# Patient Record
Sex: Female | Born: 1950
Health system: Southern US, Community
[De-identification: ages and names within clinical notes are randomized; demographics above are authoritative.]

## PROBLEM LIST (undated history)

## (undated) DIAGNOSIS — B348 Other viral infections of unspecified site: Secondary | ICD-10-CM

## (undated) DIAGNOSIS — J45909 Unspecified asthma, uncomplicated: Secondary | ICD-10-CM

## (undated) DIAGNOSIS — K8020 Calculus of gallbladder without cholecystitis without obstruction: Secondary | ICD-10-CM

## (undated) DIAGNOSIS — B338 Other specified viral diseases: Secondary | ICD-10-CM

## (undated) DIAGNOSIS — J189 Pneumonia, unspecified organism: Secondary | ICD-10-CM

## (undated) DIAGNOSIS — E785 Hyperlipidemia, unspecified: Secondary | ICD-10-CM

## (undated) DIAGNOSIS — A0472 Enterocolitis due to Clostridium difficile, not specified as recurrent: Secondary | ICD-10-CM

## (undated) DIAGNOSIS — C3490 Malignant neoplasm of unspecified part of unspecified bronchus or lung: Secondary | ICD-10-CM

## (undated) DIAGNOSIS — B029 Zoster without complications: Secondary | ICD-10-CM

## (undated) DIAGNOSIS — B974 Respiratory syncytial virus as the cause of diseases classified elsewhere: Secondary | ICD-10-CM

## (undated) DIAGNOSIS — H269 Unspecified cataract: Secondary | ICD-10-CM

## (undated) DIAGNOSIS — J449 Chronic obstructive pulmonary disease, unspecified: Secondary | ICD-10-CM

## (undated) DIAGNOSIS — D649 Anemia, unspecified: Secondary | ICD-10-CM

## (undated) DIAGNOSIS — J479 Bronchiectasis, uncomplicated: Secondary | ICD-10-CM

## (undated) DIAGNOSIS — I2510 Atherosclerotic heart disease of native coronary artery without angina pectoris: Secondary | ICD-10-CM

## (undated) DIAGNOSIS — J984 Other disorders of lung: Secondary | ICD-10-CM

## (undated) DIAGNOSIS — I509 Heart failure, unspecified: Secondary | ICD-10-CM

## (undated) DIAGNOSIS — H9190 Unspecified hearing loss, unspecified ear: Secondary | ICD-10-CM

## (undated) DIAGNOSIS — I7300 Raynaud's syndrome without gangrene: Secondary | ICD-10-CM

## (undated) DIAGNOSIS — D128 Benign neoplasm of rectum: Secondary | ICD-10-CM

## (undated) DIAGNOSIS — I10 Essential (primary) hypertension: Secondary | ICD-10-CM

## (undated) DIAGNOSIS — M341 CR(E)ST syndrome: Secondary | ICD-10-CM

## (undated) DIAGNOSIS — M8580 Other specified disorders of bone density and structure, unspecified site: Secondary | ICD-10-CM

## (undated) HISTORY — DX: Benign neoplasm of rectum: D12.8

## (undated) HISTORY — DX: Chronic obstructive pulmonary disease, unspecified: J44.9

## (undated) HISTORY — DX: Unspecified hearing loss, unspecified ear: H91.90

## (undated) HISTORY — DX: Bronchiectasis, uncomplicated: J47.9

## (undated) HISTORY — DX: Raynaud's syndrome without gangrene: I73.00

## (undated) HISTORY — DX: Atherosclerotic heart disease of native coronary artery without angina pectoris: I25.10

## (undated) HISTORY — DX: Zoster without complications: B02.9

## (undated) HISTORY — DX: Other specified viral diseases: B33.8

## (undated) HISTORY — DX: Unspecified asthma, uncomplicated: J45.909

## (undated) HISTORY — DX: Anemia, unspecified: D64.9

## (undated) HISTORY — DX: Other viral infections of unspecified site: B34.8

## (undated) HISTORY — DX: Unspecified cataract: H26.9

## (undated) HISTORY — DX: Other disorders of lung: J98.4

## (undated) HISTORY — DX: Malignant neoplasm of unspecified part of unspecified bronchus or lung: C34.90

## (undated) HISTORY — DX: Other specified disorders of bone density and structure, unspecified site: M85.80

## (undated) HISTORY — DX: Cr(e)st syndrome: M34.1

## (undated) HISTORY — PX: COLONOSCOPY: SHX174

## (undated) HISTORY — DX: Pneumonia, unspecified organism: J18.9

## (undated) HISTORY — DX: Calculus of gallbladder without cholecystitis without obstruction: K80.20

## (undated) HISTORY — DX: Hyperlipidemia, unspecified: E78.5

## (undated) HISTORY — DX: Respiratory syncytial virus as the cause of diseases classified elsewhere: B97.4

## (undated) HISTORY — DX: Enterocolitis due to Clostridium difficile, not specified as recurrent: A04.72

---

## 1998-10-03 ENCOUNTER — Emergency Department (HOSPITAL_COMMUNITY): Admission: EM | Admit: 1998-10-03 | Discharge: 1998-10-03 | Payer: Self-pay | Admitting: Emergency Medicine

## 1998-10-03 ENCOUNTER — Encounter: Payer: Self-pay | Admitting: Emergency Medicine

## 2001-06-13 ENCOUNTER — Other Ambulatory Visit: Admission: RE | Admit: 2001-06-13 | Discharge: 2001-06-13 | Payer: Self-pay | Admitting: Obstetrics and Gynecology

## 2002-02-22 HISTORY — PX: OTHER SURGICAL HISTORY: SHX169

## 2002-04-23 ENCOUNTER — Encounter: Payer: Self-pay | Admitting: Family Medicine

## 2002-04-23 ENCOUNTER — Ambulatory Visit (HOSPITAL_COMMUNITY): Admission: RE | Admit: 2002-04-23 | Discharge: 2002-04-23 | Payer: Self-pay | Admitting: Family Medicine

## 2002-05-22 ENCOUNTER — Ambulatory Visit (HOSPITAL_COMMUNITY): Admission: RE | Admit: 2002-05-22 | Discharge: 2002-05-22 | Payer: Self-pay | Admitting: Family Medicine

## 2002-05-22 ENCOUNTER — Encounter: Payer: Self-pay | Admitting: Family Medicine

## 2002-06-12 ENCOUNTER — Ambulatory Visit (HOSPITAL_COMMUNITY): Admission: RE | Admit: 2002-06-12 | Discharge: 2002-06-12 | Payer: Self-pay | Admitting: Family Medicine

## 2002-06-12 ENCOUNTER — Encounter: Payer: Self-pay | Admitting: Family Medicine

## 2002-06-14 ENCOUNTER — Ambulatory Visit (HOSPITAL_COMMUNITY): Admission: RE | Admit: 2002-06-14 | Discharge: 2002-06-14 | Payer: Self-pay | Admitting: Family Medicine

## 2002-06-14 ENCOUNTER — Encounter: Payer: Self-pay | Admitting: Family Medicine

## 2002-07-11 ENCOUNTER — Other Ambulatory Visit: Admission: RE | Admit: 2002-07-11 | Discharge: 2002-07-11 | Payer: Self-pay | Admitting: Obstetrics and Gynecology

## 2002-07-19 ENCOUNTER — Inpatient Hospital Stay (HOSPITAL_COMMUNITY): Admission: RE | Admit: 2002-07-19 | Discharge: 2002-07-23 | Payer: Self-pay | Admitting: Thoracic Surgery

## 2002-07-19 ENCOUNTER — Encounter (INDEPENDENT_AMBULATORY_CARE_PROVIDER_SITE_OTHER): Payer: Self-pay | Admitting: *Deleted

## 2002-07-19 ENCOUNTER — Encounter: Payer: Self-pay | Admitting: Thoracic Surgery

## 2002-07-20 ENCOUNTER — Encounter: Payer: Self-pay | Admitting: Thoracic Surgery

## 2002-07-21 ENCOUNTER — Encounter: Payer: Self-pay | Admitting: Thoracic Surgery

## 2002-07-22 ENCOUNTER — Encounter: Payer: Self-pay | Admitting: Thoracic Surgery

## 2002-07-23 ENCOUNTER — Encounter: Payer: Self-pay | Admitting: Thoracic Surgery

## 2002-07-31 ENCOUNTER — Encounter: Admission: RE | Admit: 2002-07-31 | Discharge: 2002-07-31 | Payer: Self-pay | Admitting: Thoracic Surgery

## 2002-07-31 ENCOUNTER — Encounter: Payer: Self-pay | Admitting: Thoracic Surgery

## 2002-08-23 ENCOUNTER — Encounter: Admission: RE | Admit: 2002-08-23 | Discharge: 2002-08-23 | Payer: Self-pay | Admitting: Thoracic Surgery

## 2002-08-23 ENCOUNTER — Encounter: Payer: Self-pay | Admitting: Thoracic Surgery

## 2002-08-31 ENCOUNTER — Encounter: Admission: RE | Admit: 2002-08-31 | Discharge: 2002-08-31 | Payer: Self-pay | Admitting: Thoracic Surgery

## 2002-08-31 ENCOUNTER — Encounter: Payer: Self-pay | Admitting: Thoracic Surgery

## 2002-09-04 ENCOUNTER — Encounter: Payer: Self-pay | Admitting: Thoracic Surgery

## 2002-09-06 ENCOUNTER — Ambulatory Visit (HOSPITAL_COMMUNITY): Admission: RE | Admit: 2002-09-06 | Discharge: 2002-09-06 | Payer: Self-pay | Admitting: Thoracic Surgery

## 2002-09-11 ENCOUNTER — Encounter: Admission: RE | Admit: 2002-09-11 | Discharge: 2002-09-11 | Payer: Self-pay | Admitting: Thoracic Surgery

## 2002-09-11 ENCOUNTER — Encounter: Payer: Self-pay | Admitting: Thoracic Surgery

## 2002-09-14 ENCOUNTER — Encounter: Admission: RE | Admit: 2002-09-14 | Discharge: 2002-09-14 | Payer: Self-pay | Admitting: Thoracic Surgery

## 2002-09-14 ENCOUNTER — Encounter: Payer: Self-pay | Admitting: Thoracic Surgery

## 2002-09-18 ENCOUNTER — Encounter: Admission: RE | Admit: 2002-09-18 | Discharge: 2002-09-18 | Payer: Self-pay | Admitting: Thoracic Surgery

## 2002-09-18 ENCOUNTER — Encounter: Payer: Self-pay | Admitting: Thoracic Surgery

## 2002-10-02 ENCOUNTER — Encounter: Payer: Self-pay | Admitting: Thoracic Surgery

## 2002-10-02 ENCOUNTER — Encounter: Admission: RE | Admit: 2002-10-02 | Discharge: 2002-10-02 | Payer: Self-pay | Admitting: Thoracic Surgery

## 2002-10-11 ENCOUNTER — Inpatient Hospital Stay (HOSPITAL_COMMUNITY): Admission: RE | Admit: 2002-10-11 | Discharge: 2002-12-07 | Payer: Self-pay | Admitting: Pulmonary Disease

## 2002-10-11 ENCOUNTER — Encounter (INDEPENDENT_AMBULATORY_CARE_PROVIDER_SITE_OTHER): Payer: Self-pay | Admitting: Specialist

## 2002-10-11 ENCOUNTER — Encounter: Payer: Self-pay | Admitting: Thoracic Surgery

## 2002-10-12 ENCOUNTER — Encounter: Payer: Self-pay | Admitting: Thoracic Surgery

## 2002-10-13 ENCOUNTER — Encounter: Payer: Self-pay | Admitting: Thoracic Surgery

## 2002-10-14 ENCOUNTER — Encounter: Payer: Self-pay | Admitting: Thoracic Surgery

## 2002-10-15 ENCOUNTER — Encounter: Payer: Self-pay | Admitting: Thoracic Surgery

## 2002-10-16 ENCOUNTER — Encounter: Payer: Self-pay | Admitting: Thoracic Surgery

## 2002-10-17 ENCOUNTER — Encounter: Payer: Self-pay | Admitting: Thoracic Surgery

## 2002-10-18 ENCOUNTER — Encounter: Payer: Self-pay | Admitting: Thoracic Surgery

## 2002-10-19 ENCOUNTER — Encounter: Payer: Self-pay | Admitting: Thoracic Surgery

## 2002-10-21 ENCOUNTER — Encounter: Payer: Self-pay | Admitting: Thoracic Surgery

## 2002-10-22 ENCOUNTER — Encounter (INDEPENDENT_AMBULATORY_CARE_PROVIDER_SITE_OTHER): Payer: Self-pay | Admitting: *Deleted

## 2002-10-22 ENCOUNTER — Encounter: Payer: Self-pay | Admitting: Thoracic Surgery

## 2002-10-23 ENCOUNTER — Encounter: Payer: Self-pay | Admitting: Thoracic Surgery

## 2002-10-24 ENCOUNTER — Encounter: Payer: Self-pay | Admitting: Thoracic Surgery

## 2002-10-25 ENCOUNTER — Encounter: Payer: Self-pay | Admitting: Thoracic Surgery

## 2002-10-25 ENCOUNTER — Encounter: Payer: Self-pay | Admitting: Internal Medicine

## 2002-10-25 ENCOUNTER — Encounter: Payer: Self-pay | Admitting: Vascular Surgery

## 2002-10-26 ENCOUNTER — Encounter: Payer: Self-pay | Admitting: Internal Medicine

## 2002-10-26 ENCOUNTER — Encounter: Payer: Self-pay | Admitting: Thoracic Surgery

## 2002-10-27 ENCOUNTER — Encounter: Payer: Self-pay | Admitting: Thoracic Surgery

## 2002-10-28 ENCOUNTER — Encounter: Payer: Self-pay | Admitting: Thoracic Surgery

## 2002-10-29 ENCOUNTER — Encounter (INDEPENDENT_AMBULATORY_CARE_PROVIDER_SITE_OTHER): Payer: Self-pay | Admitting: *Deleted

## 2002-10-29 ENCOUNTER — Encounter: Payer: Self-pay | Admitting: General Surgery

## 2002-10-29 ENCOUNTER — Encounter: Payer: Self-pay | Admitting: Thoracic Surgery

## 2002-10-30 ENCOUNTER — Encounter: Payer: Self-pay | Admitting: Pulmonary Disease

## 2002-10-31 ENCOUNTER — Encounter: Payer: Self-pay | Admitting: Thoracic Surgery

## 2002-11-01 ENCOUNTER — Encounter: Payer: Self-pay | Admitting: Thoracic Surgery

## 2002-11-02 ENCOUNTER — Encounter: Payer: Self-pay | Admitting: Thoracic Surgery

## 2002-11-03 ENCOUNTER — Encounter: Payer: Self-pay | Admitting: Thoracic Surgery

## 2002-11-04 ENCOUNTER — Encounter: Payer: Self-pay | Admitting: Thoracic Surgery

## 2002-11-05 ENCOUNTER — Encounter: Payer: Self-pay | Admitting: Thoracic Surgery

## 2002-11-06 ENCOUNTER — Encounter: Payer: Self-pay | Admitting: Thoracic Surgery

## 2002-11-07 ENCOUNTER — Encounter: Payer: Self-pay | Admitting: Pulmonary Disease

## 2002-11-07 ENCOUNTER — Encounter: Payer: Self-pay | Admitting: Thoracic Surgery

## 2002-11-08 ENCOUNTER — Encounter: Payer: Self-pay | Admitting: Thoracic Surgery

## 2002-11-09 ENCOUNTER — Encounter: Payer: Self-pay | Admitting: Thoracic Surgery

## 2002-11-10 ENCOUNTER — Encounter: Payer: Self-pay | Admitting: Thoracic Surgery

## 2002-11-11 ENCOUNTER — Encounter: Payer: Self-pay | Admitting: Thoracic Surgery

## 2002-11-12 ENCOUNTER — Encounter: Payer: Self-pay | Admitting: Thoracic Surgery

## 2002-11-13 ENCOUNTER — Encounter: Payer: Self-pay | Admitting: Thoracic Surgery

## 2002-11-13 ENCOUNTER — Encounter: Payer: Self-pay | Admitting: Gastroenterology

## 2002-11-14 ENCOUNTER — Encounter: Payer: Self-pay | Admitting: Thoracic Surgery

## 2002-11-14 ENCOUNTER — Encounter: Payer: Self-pay | Admitting: Gastroenterology

## 2002-11-14 ENCOUNTER — Encounter (INDEPENDENT_AMBULATORY_CARE_PROVIDER_SITE_OTHER): Payer: Self-pay | Admitting: *Deleted

## 2002-11-16 ENCOUNTER — Encounter: Payer: Self-pay | Admitting: Thoracic Surgery

## 2002-11-18 ENCOUNTER — Encounter: Payer: Self-pay | Admitting: Thoracic Surgery

## 2002-11-19 ENCOUNTER — Encounter: Payer: Self-pay | Admitting: Pulmonary Disease

## 2002-11-20 ENCOUNTER — Encounter: Payer: Self-pay | Admitting: Thoracic Surgery

## 2002-11-21 ENCOUNTER — Encounter: Payer: Self-pay | Admitting: Thoracic Surgery

## 2002-11-21 ENCOUNTER — Encounter (INDEPENDENT_AMBULATORY_CARE_PROVIDER_SITE_OTHER): Payer: Self-pay | Admitting: Specialist

## 2002-11-23 ENCOUNTER — Encounter: Payer: Self-pay | Admitting: Thoracic Surgery

## 2002-11-26 ENCOUNTER — Encounter: Payer: Self-pay | Admitting: Pulmonary Disease

## 2002-11-30 ENCOUNTER — Encounter: Payer: Self-pay | Admitting: Pulmonary Disease

## 2002-12-03 ENCOUNTER — Encounter: Payer: Self-pay | Admitting: Pulmonary Disease

## 2002-12-04 ENCOUNTER — Encounter: Payer: Self-pay | Admitting: Pulmonary Disease

## 2002-12-06 ENCOUNTER — Encounter: Payer: Self-pay | Admitting: Thoracic Surgery

## 2002-12-13 ENCOUNTER — Encounter: Admission: RE | Admit: 2002-12-13 | Discharge: 2002-12-13 | Payer: Self-pay | Admitting: Thoracic Surgery

## 2002-12-13 ENCOUNTER — Encounter: Payer: Self-pay | Admitting: Thoracic Surgery

## 2003-01-08 ENCOUNTER — Encounter: Admission: RE | Admit: 2003-01-08 | Discharge: 2003-01-08 | Payer: Self-pay | Admitting: Thoracic Surgery

## 2003-01-24 ENCOUNTER — Encounter: Admission: RE | Admit: 2003-01-24 | Discharge: 2003-01-24 | Payer: Self-pay | Admitting: Thoracic Surgery

## 2003-02-20 ENCOUNTER — Encounter: Admission: RE | Admit: 2003-02-20 | Discharge: 2003-02-20 | Payer: Self-pay | Admitting: Thoracic Surgery

## 2003-03-07 ENCOUNTER — Encounter: Admission: RE | Admit: 2003-03-07 | Discharge: 2003-03-07 | Payer: Self-pay | Admitting: Thoracic Surgery

## 2003-04-11 ENCOUNTER — Encounter: Admission: RE | Admit: 2003-04-11 | Discharge: 2003-04-11 | Payer: Self-pay | Admitting: Thoracic Surgery

## 2003-04-29 ENCOUNTER — Encounter: Payer: Self-pay | Admitting: Internal Medicine

## 2003-05-09 ENCOUNTER — Encounter: Admission: RE | Admit: 2003-05-09 | Discharge: 2003-05-09 | Payer: Self-pay | Admitting: Thoracic Surgery

## 2003-05-10 ENCOUNTER — Ambulatory Visit (HOSPITAL_COMMUNITY): Admission: RE | Admit: 2003-05-10 | Discharge: 2003-05-10 | Payer: Self-pay | Admitting: Internal Medicine

## 2003-05-30 ENCOUNTER — Encounter: Admission: RE | Admit: 2003-05-30 | Discharge: 2003-05-30 | Payer: Self-pay | Admitting: Thoracic Surgery

## 2003-11-26 ENCOUNTER — Encounter: Payer: Self-pay | Admitting: Internal Medicine

## 2003-12-24 ENCOUNTER — Ambulatory Visit: Payer: Self-pay | Admitting: Internal Medicine

## 2004-02-23 ENCOUNTER — Encounter: Payer: Self-pay | Admitting: Internal Medicine

## 2004-02-23 LAB — CONVERTED CEMR LAB: Pap Smear: NORMAL

## 2004-02-26 ENCOUNTER — Ambulatory Visit: Payer: Self-pay | Admitting: Internal Medicine

## 2004-04-13 ENCOUNTER — Other Ambulatory Visit: Admission: RE | Admit: 2004-04-13 | Discharge: 2004-04-13 | Payer: Self-pay | Admitting: Obstetrics and Gynecology

## 2004-05-27 ENCOUNTER — Ambulatory Visit: Payer: Self-pay | Admitting: Internal Medicine

## 2004-09-24 ENCOUNTER — Ambulatory Visit: Payer: Self-pay | Admitting: Internal Medicine

## 2004-10-06 ENCOUNTER — Ambulatory Visit: Payer: Self-pay | Admitting: Internal Medicine

## 2004-12-21 ENCOUNTER — Ambulatory Visit: Payer: Self-pay | Admitting: Internal Medicine

## 2005-01-12 ENCOUNTER — Ambulatory Visit: Payer: Self-pay | Admitting: Internal Medicine

## 2005-02-22 DIAGNOSIS — D128 Benign neoplasm of rectum: Secondary | ICD-10-CM

## 2005-02-22 HISTORY — DX: Benign neoplasm of rectum: D12.8

## 2005-03-01 ENCOUNTER — Ambulatory Visit: Payer: Self-pay | Admitting: Internal Medicine

## 2005-03-23 ENCOUNTER — Ambulatory Visit: Payer: Self-pay | Admitting: Internal Medicine

## 2005-04-02 ENCOUNTER — Ambulatory Visit: Payer: Self-pay | Admitting: Internal Medicine

## 2005-06-07 ENCOUNTER — Ambulatory Visit: Payer: Self-pay | Admitting: Internal Medicine

## 2005-07-21 ENCOUNTER — Ambulatory Visit: Payer: Self-pay | Admitting: Internal Medicine

## 2005-11-23 ENCOUNTER — Ambulatory Visit: Payer: Self-pay | Admitting: Internal Medicine

## 2005-11-29 ENCOUNTER — Ambulatory Visit: Payer: Self-pay | Admitting: Internal Medicine

## 2005-12-21 ENCOUNTER — Ambulatory Visit: Payer: Self-pay | Admitting: Internal Medicine

## 2006-01-07 ENCOUNTER — Ambulatory Visit: Payer: Self-pay | Admitting: Internal Medicine

## 2006-01-07 ENCOUNTER — Encounter: Payer: Self-pay | Admitting: Internal Medicine

## 2006-01-07 ENCOUNTER — Encounter (INDEPENDENT_AMBULATORY_CARE_PROVIDER_SITE_OTHER): Payer: Self-pay | Admitting: Specialist

## 2006-03-02 ENCOUNTER — Encounter: Payer: Self-pay | Admitting: Internal Medicine

## 2006-05-31 ENCOUNTER — Ambulatory Visit: Payer: Self-pay | Admitting: Internal Medicine

## 2006-11-08 ENCOUNTER — Encounter: Payer: Self-pay | Admitting: Internal Medicine

## 2006-11-08 DIAGNOSIS — M899 Disorder of bone, unspecified: Secondary | ICD-10-CM | POA: Insufficient documentation

## 2006-11-08 DIAGNOSIS — I7300 Raynaud's syndrome without gangrene: Secondary | ICD-10-CM | POA: Insufficient documentation

## 2006-11-08 DIAGNOSIS — E785 Hyperlipidemia, unspecified: Secondary | ICD-10-CM | POA: Insufficient documentation

## 2006-11-08 DIAGNOSIS — M949 Disorder of cartilage, unspecified: Secondary | ICD-10-CM

## 2006-12-07 ENCOUNTER — Ambulatory Visit: Payer: Self-pay | Admitting: Internal Medicine

## 2006-12-07 LAB — CONVERTED CEMR LAB
ALT: 22 units/L (ref 0–35)
AST: 23 units/L (ref 0–37)
Albumin: 4.1 g/dL (ref 3.5–5.2)
Alkaline Phosphatase: 68 units/L (ref 39–117)
BUN: 13 mg/dL (ref 6–23)
Basophils Absolute: 0 10*3/uL (ref 0.0–0.1)
Basophils Relative: 0.3 % (ref 0.0–1.0)
Bilirubin Urine: NEGATIVE
Bilirubin, Direct: 0.2 mg/dL (ref 0.0–0.3)
CO2: 28 meq/L (ref 19–32)
Calcium: 9.7 mg/dL (ref 8.4–10.5)
Chloride: 101 meq/L (ref 96–112)
Cholesterol: 248 mg/dL (ref 0–200)
Creatinine, Ser: 0.7 mg/dL (ref 0.4–1.2)
Direct LDL: 167 mg/dL
Eosinophils Absolute: 0.1 10*3/uL (ref 0.0–0.6)
Eosinophils Relative: 0.9 % (ref 0.0–5.0)
GFR calc Af Amer: 112 mL/min
GFR calc non Af Amer: 93 mL/min
Glucose, Bld: 103 mg/dL — ABNORMAL HIGH (ref 70–99)
Glucose, Urine, Semiquant: NEGATIVE
HCT: 39.6 % (ref 36.0–46.0)
HDL: 41.9 mg/dL (ref >39.0)
Hemoglobin: 13.4 g/dL (ref 12.0–15.0)
Ketones, urine, test strip: NEGATIVE
Lymphocytes Relative: 26.6 % (ref 12.0–46.0)
MCHC: 33.7 g/dL (ref 30.0–36.0)
MCV: 87.1 fL (ref 78.0–100.0)
Monocytes Absolute: 0.4 10*3/uL (ref 0.2–0.7)
Monocytes Relative: 6.4 % (ref 3.0–11.0)
Neutro Abs: 3.7 10*3/uL (ref 1.4–7.7)
Neutrophils Relative %: 65.8 % (ref 43.0–77.0)
Nitrite: NEGATIVE
Platelets: 220 10*3/uL (ref 150–400)
Potassium: 4.7 meq/L (ref 3.5–5.1)
Protein, U semiquant: NEGATIVE
RBC: 4.55 M/uL (ref 3.87–5.11)
RDW: 14.7 % — ABNORMAL HIGH (ref 11.5–14.6)
Sodium: 138 meq/L (ref 135–145)
Specific Gravity, Urine: 1.01
TSH: 3.55 microintl units/mL (ref 0.35–5.50)
Total Bilirubin: 0.8 mg/dL (ref 0.3–1.2)
Total CHOL/HDL Ratio: 5.9
Total Protein: 7 g/dL (ref 6.0–8.3)
Triglycerides: 207 mg/dL (ref 0–149)
Urobilinogen, UA: 0.2
VLDL: 41 mg/dL — ABNORMAL HIGH (ref 0–40)
WBC Urine, dipstick: NEGATIVE
WBC: 5.7 10*3/uL (ref 4.5–10.5)
pH: 5.5

## 2006-12-14 ENCOUNTER — Ambulatory Visit: Payer: Self-pay | Admitting: Internal Medicine

## 2006-12-26 ENCOUNTER — Ambulatory Visit: Payer: Self-pay | Admitting: Internal Medicine

## 2007-01-04 ENCOUNTER — Ambulatory Visit: Payer: Self-pay | Admitting: Pulmonary Disease

## 2007-01-10 ENCOUNTER — Ambulatory Visit: Payer: Self-pay | Admitting: Pulmonary Disease

## 2007-01-13 ENCOUNTER — Telehealth (INDEPENDENT_AMBULATORY_CARE_PROVIDER_SITE_OTHER): Payer: Self-pay | Admitting: *Deleted

## 2007-01-16 ENCOUNTER — Ambulatory Visit: Payer: Self-pay | Admitting: Internal Medicine

## 2007-01-26 ENCOUNTER — Ambulatory Visit: Payer: Self-pay | Admitting: Pulmonary Disease

## 2007-02-06 ENCOUNTER — Telehealth (INDEPENDENT_AMBULATORY_CARE_PROVIDER_SITE_OTHER): Payer: Self-pay | Admitting: *Deleted

## 2007-02-09 ENCOUNTER — Encounter (HOSPITAL_COMMUNITY): Admission: RE | Admit: 2007-02-09 | Discharge: 2007-02-21 | Payer: Self-pay | Admitting: Pulmonary Disease

## 2007-02-23 ENCOUNTER — Encounter (HOSPITAL_COMMUNITY): Admission: RE | Admit: 2007-02-23 | Discharge: 2007-05-18 | Payer: Self-pay | Admitting: Internal Medicine

## 2007-05-31 ENCOUNTER — Encounter: Payer: Self-pay | Admitting: Pulmonary Disease

## 2007-07-27 ENCOUNTER — Ambulatory Visit: Payer: Self-pay | Admitting: Critical Care Medicine

## 2007-09-27 ENCOUNTER — Ambulatory Visit: Payer: Self-pay | Admitting: Pulmonary Disease

## 2008-01-24 ENCOUNTER — Ambulatory Visit: Payer: Self-pay | Admitting: Internal Medicine

## 2008-01-24 LAB — CONVERTED CEMR LAB
AST: 22 units/L (ref 0–37)
Basophils Absolute: 0 10*3/uL (ref 0.0–0.1)
Bilirubin Urine: NEGATIVE
Bilirubin, Direct: 0.1 mg/dL (ref 0.0–0.3)
Blood in Urine, dipstick: NEGATIVE
CO2: 31 meq/L (ref 19–32)
Chloride: 104 meq/L (ref 96–112)
Cholesterol: 251 mg/dL (ref 0–200)
Creatinine, Ser: 0.6 mg/dL (ref 0.4–1.2)
Direct LDL: 173.7 mg/dL
Eosinophils Absolute: 0 10*3/uL (ref 0.0–0.7)
Eosinophils Relative: 0.9 % (ref 0.0–5.0)
Glucose, Bld: 89 mg/dL (ref 70–99)
Glucose, Urine, Semiquant: NEGATIVE
HCT: 37.7 % (ref 36.0–46.0)
Ketones, urine, test strip: NEGATIVE
MCHC: 34.2 g/dL (ref 30.0–36.0)
Neutro Abs: 4 10*3/uL (ref 1.4–7.7)
Neutrophils Relative %: 71 % (ref 43.0–77.0)
Nitrite: NEGATIVE
Potassium: 4.3 meq/L (ref 3.5–5.1)
Protein, U semiquant: NEGATIVE
Sodium: 142 meq/L (ref 135–145)
Specific Gravity, Urine: 1.015
Total Bilirubin: 0.6 mg/dL (ref 0.3–1.2)
Urobilinogen, UA: 0.2
pH: 6.5

## 2008-02-07 ENCOUNTER — Ambulatory Visit: Payer: Self-pay | Admitting: Internal Medicine

## 2008-02-07 DIAGNOSIS — M549 Dorsalgia, unspecified: Secondary | ICD-10-CM | POA: Insufficient documentation

## 2008-03-12 ENCOUNTER — Encounter: Payer: Self-pay | Admitting: Internal Medicine

## 2008-03-27 ENCOUNTER — Ambulatory Visit: Payer: Self-pay | Admitting: Pulmonary Disease

## 2008-04-03 ENCOUNTER — Encounter: Payer: Self-pay | Admitting: Internal Medicine

## 2008-04-10 ENCOUNTER — Ambulatory Visit: Payer: Self-pay | Admitting: Internal Medicine

## 2008-06-18 ENCOUNTER — Ambulatory Visit: Payer: Self-pay | Admitting: Family Medicine

## 2008-06-18 DIAGNOSIS — I10 Essential (primary) hypertension: Secondary | ICD-10-CM | POA: Insufficient documentation

## 2008-07-10 ENCOUNTER — Ambulatory Visit: Payer: Self-pay | Admitting: Internal Medicine

## 2008-10-30 ENCOUNTER — Ambulatory Visit: Payer: Self-pay | Admitting: Internal Medicine

## 2008-12-09 ENCOUNTER — Encounter (INDEPENDENT_AMBULATORY_CARE_PROVIDER_SITE_OTHER): Payer: Self-pay | Admitting: *Deleted

## 2009-02-03 ENCOUNTER — Ambulatory Visit: Payer: Self-pay | Admitting: Internal Medicine

## 2009-02-03 LAB — CONVERTED CEMR LAB
ALT: 15 units/L (ref 0–35)
Alkaline Phosphatase: 79 units/L (ref 39–117)
Basophils Relative: 0.7 % (ref 0.0–3.0)
Bilirubin, Direct: 0 mg/dL (ref 0.0–0.3)
Calcium: 9.5 mg/dL (ref 8.4–10.5)
Chloride: 105 meq/L (ref 96–112)
Cholesterol: 238 mg/dL — ABNORMAL HIGH (ref 0–200)
Creatinine, Ser: 0.7 mg/dL (ref 0.4–1.2)
Eosinophils Relative: 1.3 % (ref 0.0–5.0)
Lymphocytes Relative: 23.9 % (ref 12.0–46.0)
Neutrophils Relative %: 67.1 % (ref 43.0–77.0)
RBC: 4.08 M/uL (ref 3.87–5.11)
Total CHOL/HDL Ratio: 5
Total Protein: 7.6 g/dL (ref 6.0–8.3)
Triglycerides: 140 mg/dL (ref 0.0–149.0)
VLDL: 28 mg/dL (ref 0.0–40.0)
WBC: 5.2 10*3/uL (ref 4.5–10.5)

## 2009-02-10 ENCOUNTER — Ambulatory Visit: Payer: Self-pay | Admitting: Internal Medicine

## 2009-03-26 ENCOUNTER — Encounter: Payer: Self-pay | Admitting: Internal Medicine

## 2009-04-02 ENCOUNTER — Ambulatory Visit: Payer: Self-pay | Admitting: Pulmonary Disease

## 2009-04-18 ENCOUNTER — Encounter (INDEPENDENT_AMBULATORY_CARE_PROVIDER_SITE_OTHER): Payer: Self-pay | Admitting: *Deleted

## 2009-05-20 ENCOUNTER — Encounter (INDEPENDENT_AMBULATORY_CARE_PROVIDER_SITE_OTHER): Payer: Self-pay | Admitting: *Deleted

## 2009-05-21 ENCOUNTER — Ambulatory Visit: Payer: Self-pay | Admitting: Internal Medicine

## 2009-06-06 ENCOUNTER — Ambulatory Visit: Payer: Self-pay | Admitting: Internal Medicine

## 2009-06-11 ENCOUNTER — Encounter: Payer: Self-pay | Admitting: Internal Medicine

## 2009-07-07 ENCOUNTER — Telehealth (INDEPENDENT_AMBULATORY_CARE_PROVIDER_SITE_OTHER): Payer: Self-pay | Admitting: *Deleted

## 2009-07-23 ENCOUNTER — Ambulatory Visit: Payer: Self-pay | Admitting: Internal Medicine

## 2009-08-29 ENCOUNTER — Ambulatory Visit: Payer: Self-pay | Admitting: Pulmonary Disease

## 2009-11-06 ENCOUNTER — Encounter: Payer: Self-pay | Admitting: Internal Medicine

## 2009-12-03 ENCOUNTER — Ambulatory Visit: Payer: Self-pay | Admitting: Pulmonary Disease

## 2009-12-03 ENCOUNTER — Encounter: Payer: Self-pay | Admitting: Internal Medicine

## 2009-12-05 ENCOUNTER — Encounter: Payer: Self-pay | Admitting: Pulmonary Disease

## 2009-12-05 DIAGNOSIS — J45909 Unspecified asthma, uncomplicated: Secondary | ICD-10-CM | POA: Insufficient documentation

## 2010-02-25 ENCOUNTER — Other Ambulatory Visit: Payer: Self-pay | Admitting: Internal Medicine

## 2010-02-25 ENCOUNTER — Ambulatory Visit
Admission: RE | Admit: 2010-02-25 | Discharge: 2010-02-25 | Payer: Self-pay | Source: Home / Self Care | Attending: Internal Medicine | Admitting: Internal Medicine

## 2010-02-25 LAB — BASIC METABOLIC PANEL
BUN: 19 mg/dL (ref 6–23)
CO2: 30 mEq/L (ref 19–32)
Calcium: 9.7 mg/dL (ref 8.4–10.5)
Chloride: 101 mEq/L (ref 96–112)
Creatinine, Ser: 0.6 mg/dL (ref 0.4–1.2)
GFR: 111.53 mL/min (ref >60.00)
Glucose, Bld: 92 mg/dL (ref 70–99)
Potassium: 5.1 mEq/L (ref 3.5–5.1)
Sodium: 139 mEq/L (ref 135–145)

## 2010-02-25 LAB — CONVERTED CEMR LAB
Bilirubin Urine: NEGATIVE
Blood in Urine, dipstick: NEGATIVE
Glucose, Urine, Semiquant: NEGATIVE
Ketones, urine, test strip: NEGATIVE
Nitrite: NEGATIVE
Protein, U semiquant: NEGATIVE
Specific Gravity, Urine: 1.015
Urobilinogen, UA: 0.2
WBC Urine, dipstick: NEGATIVE
pH: 5

## 2010-02-25 LAB — CBC WITH DIFFERENTIAL/PLATELET
Basophils Absolute: 0 10*3/uL (ref 0.0–0.1)
Basophils Relative: 0.4 % (ref 0.0–3.0)
Eosinophils Absolute: 0.1 10*3/uL (ref 0.0–0.7)
Eosinophils Relative: 1.4 % (ref 0.0–5.0)
HCT: 37.4 % (ref 36.0–46.0)
Hemoglobin: 12.7 g/dL (ref 12.0–15.0)
Lymphocytes Relative: 20.6 % (ref 12.0–46.0)
Lymphs Abs: 1.1 10*3/uL (ref 0.7–4.0)
MCHC: 33.8 g/dL (ref 30.0–36.0)
MCV: 87.6 fl (ref 78.0–100.0)
Monocytes Absolute: 0.4 10*3/uL (ref 0.1–1.0)
Monocytes Relative: 6.5 % (ref 3.0–12.0)
Neutro Abs: 3.9 10*3/uL (ref 1.4–7.7)
Neutrophils Relative %: 71.1 % (ref 43.0–77.0)
Platelets: 291 10*3/uL (ref 150.0–400.0)
RBC: 4.27 Mil/uL (ref 3.87–5.11)
RDW: 15.9 % — ABNORMAL HIGH (ref 11.5–14.6)
WBC: 5.5 10*3/uL (ref 4.5–10.5)

## 2010-02-25 LAB — LDL CHOLESTEROL, DIRECT: Direct LDL: 172 mg/dL

## 2010-02-25 LAB — HEPATIC FUNCTION PANEL
ALT: 22 U/L (ref 0–35)
AST: 22 U/L (ref 0–37)
Albumin: 4.2 g/dL (ref 3.5–5.2)
Alkaline Phosphatase: 82 U/L (ref 39–117)
Bilirubin, Direct: 0.1 mg/dL (ref 0.0–0.3)
Total Bilirubin: 0.6 mg/dL (ref 0.3–1.2)
Total Protein: 7.6 g/dL (ref 6.0–8.3)

## 2010-02-25 LAB — LIPID PANEL
Cholesterol: 247 mg/dL — ABNORMAL HIGH (ref 0–200)
HDL: 53.5 mg/dL (ref >39.00)
Total CHOL/HDL Ratio: 5
Triglycerides: 137 mg/dL (ref 0.0–149.0)
VLDL: 27.4 mg/dL (ref 0.0–40.0)

## 2010-02-25 LAB — TSH: TSH: 3.31 u[IU]/mL (ref 0.35–5.50)

## 2010-03-04 ENCOUNTER — Ambulatory Visit
Admission: RE | Admit: 2010-03-04 | Discharge: 2010-03-04 | Payer: Self-pay | Source: Home / Self Care | Attending: Internal Medicine | Admitting: Internal Medicine

## 2010-03-14 ENCOUNTER — Encounter: Payer: Self-pay | Admitting: Thoracic Surgery

## 2010-03-15 ENCOUNTER — Encounter: Payer: Self-pay | Admitting: Thoracic Surgery

## 2010-03-15 ENCOUNTER — Encounter: Payer: Self-pay | Admitting: Internal Medicine

## 2010-03-19 ENCOUNTER — Encounter
Admission: RE | Admit: 2010-03-19 | Discharge: 2010-03-24 | Payer: Self-pay | Source: Home / Self Care | Attending: Internal Medicine | Admitting: Internal Medicine

## 2010-03-24 NOTE — Assessment & Plan Note (Signed)
Summary: acute sick visit for pna   Primary Provider/Referring Provider:  [name] MD  CC:  Acute Visit.  c/o prod cough with yellow mucus, fatigue, HA, increased SOB, and wheezing x11days.  fever 100.6 yesterday.  History of Present Illness: The pt comes in today for an acute sick visit.  She has known cold air hyperreactivity, as well as a prior complex chest wall resection for cancer.  She gives a 1-2 week h/o chest congestion, cough with purulent mucus, fever, and now sob.  This started as upper respiratory symptoms after flying home from [place].  She has significant sob on lying down at night.  She has not had any purulence from nares, no abdominal or urinary tract symptoms.  Prior to this, she had been doing well from a pulmonary standpoint.  Current Medications (verified): 1)  Metronidazole 0.75 %  Lotn (Metronidazole) .... Apply At Bedtime As Needed 2)  Triamcinolone Acetonide 0.5 % Crea (Triamcinolone Acetonide) .... Apply 1 A Small Amount To Affected Area Twice A Day As Needed 3)  Multivitamins   Tabs (Multiple Vitamin) .... Once Daily 4)  Oxycodone-Acetaminophen 5-325 Mg Tabs (Oxycodone-Acetaminophen) .... Take 1 Tablet By Mouth Three Times A Day 5)  Gabapentin 400 Mg Caps (Gabapentin) .... Take 1 Tablet By Mouth Four Times A Day 6)  Cymbalta 60 Mg  Cpep (Duloxetine Hcl) .... Take 1 Capsule By Mouth At Bedtime 7)  Risaquad  Caps (Probiotic Product) .... Once Daily 8)  Lidoderm 5 % Ptch (Lidocaine) .... To Chest Wall--12 Hours On and 12 Hours Off 9)  Proair Hfa 108 (90 Base) Mcg/act  Aers (Albuterol Sulfate) .... 2 Puffs Every 4-6 Hours As Needed 10)  Calcium Citrate-Vitamin D 200-125 Mg-Unit Tabs (Calcium Citrate-Vitamin D) .... Once Daily 11)  Lisinopril 2.5 Mg Tabs (Lisinopril) .... Take 1 Tablet By Mouth Once A Day 12)  Glucosamine 1500 Complex  Caps (Glucosamine-Chondroit-Vit C-Mn) .... Once Daily  Allergies (verified): 1)  ! Megace Oral (Megestrol Acetate) 2)  !  Lodine  Past History:  Past medical, surgical, family and social histories (including risk factors) reviewed, and no changes noted (except as noted below).  Past Medical History: Reviewed history from 03/27/2008 and no changes required. 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 multiple redos of surgeries---  thoracotomies    redo at [facility],  bronchopleural fistula  , pulmonary carcinoid Cholecystectomy  Family History: Reviewed history from 12/14/2006 and no changes required. adopted no known family history  Social History: Reviewed history from 12/14/2006 and no changes required. Married Never Smoked Regular exercise-no  Review of Systems       The patient complains of shortness of breath with activity, shortness of breath at rest, productive cough, headaches, nasal congestion/difficulty breathing through nose, change in color of mucus, and fever.  The patient denies non-productive cough, coughing up blood, chest pain, irregular heartbeats, acid heartburn, indigestion, loss of appetite, weight change, abdominal pain, difficulty swallowing, sore throat, tooth/dental problems, sneezing, itching, ear ache, anxiety, depression, hand/feet swelling, joint stiffness or pain, and rash.    Vital Signs:  Patient profile:   60 year old female Menstrual status:  postmenopausal Height:      65 inches Weight:      139 pounds BMI:  23.21 O2 Sat:      94 % on Room air Temp:     100.0 degrees F oral Pulse rate:   112 / minute BP sitting:   130 / 80  (left arm) Cuff size:   regular  Vitals Entered By: Raymondo Band RN (August 29, 2009 3:30 PM)  O2 Flow:  Room  air CC: Acute Visit.  c/o prod cough with yellow mucus, fatigue, HA, increased SOB, wheezing x11days.  fever 100.6 yesterday Comments Medications reviewed with patient Daytime contact number verified with patient. Raymondo Band RN  August 29, 2009 3:32 PM    Physical Exam  General:  wd female in nad Eyes:  PERRLA and EOMI.   Nose:  narrowed on right, no purulence bilat Mouth:  clear, no exudates or inflammation Neck:  no LN Lungs:  decreased bs left base, faint right basilar crackles no wheezing noted. Heart:  rrr, no mrg Extremities:  no edema or cyanosis Neurologic:  alert and oriented, moves all 4.   Impression & Recommendations:  Problem # 1:  PNEUMONIA, ORGANISM UNSPECIFIED (ICD-486) the pt has had persistent cough with congestion and purulence mucus, as well as ongoing fever.  This is most c/w pna on clinical grounds, but could just be acute bronchitis.  Either way, will need treatment with abx.  Will obtain a cxr to verify diagnosis, but also make sure she doesn't have a complication such as effusion.  Will treat with avelox for 7 days, and she is to call if worsening symptoms or not improving.  Medications Added to Medication List This Visit: 1)  Avelox 400 Mg Tabs (Moxifloxacin hcl) .... By mouth daily  Other Orders: Est. Patient Level IV (99214) T-2 View CXR (71020TC)  Patient Instructions: 1)  will start on avelox 452m one each day for the next 7 days. 2)  will check a cxr today, and call you with results 3)  drink plenty of fluids, can take mucinex for thick mucus and congestion 4)  keep followup with me in October, but call if you are not getting better within a week.  Prescriptions: AVELOX 400 MG  TABS (MOXIFLOXACIN HCL) By mouth daily  #5 x 0   Entered and Authorized by:   KKathee DeltonMD   Signed by:   KKathee DeltonMD on 08/29/2009   Method used:   Print then Give to Patient   RxID:   [id number]

## 2010-03-24 NOTE — Assessment & Plan Note (Signed)
Summary: rov for RAD, restrictive lung disease.   Visit Type:  Follow-up Primary Deborah [name]/Referring Deborah [name]:  Deborah Sharps MD  CC:  3 month follow up. Pt states her breathing is doing okay. Pt states when it is cold she gets more sob.  Pt states no cough. Pt states she would like flu shot today. Deborah [name]  History of Present Illness: the pt comes in today for followup of her cold air hyperreactivity.  She also has known restrictive lung disease related to a complex chest wall resection of a cancer in the past.  Since the weather has changed, she has noted a little bit more of a breathing issue with caring for her horses in the am's.  She denies any breathing issues if she is not in cold air.  She denies any cough or congestion.    Current Medications (verified): 1)  Metronidazole 0.75 %  Lotn (Metronidazole) .... Apply At Bedtime As Needed 2)  Triamcinolone Acetonide 0.5 % Crea (Triamcinolone Acetonide) .... Apply 1 A Small Amount To Affected Area Twice A Day As Needed 3)  Multivitamins   Tabs (Multiple Vitamin) .... Once Daily 4)  Oxycodone-Acetaminophen 5-325 Mg Tabs (Oxycodone-Acetaminophen) .... Take 1 Tablet By Mouth Three Times A Day 5)  Gabapentin 400 Mg Caps (Gabapentin) .... Take 1 Tablet By Mouth Four Times A Day 6)  Cymbalta 60 Mg  Cpep (Duloxetine Hcl) .... Take 1 Capsule By Mouth At Bedtime 7)  Risaquad  Caps (Probiotic Product) .... Once Daily 8)  Lidoderm 5 % Ptch (Lidocaine) .... To Chest Wall--12 Hours On and 12 Hours Off 9)  Proair Hfa 108 (90 Base) Mcg/act  Aers (Albuterol Sulfate) .... 2 Puffs Every 4-6 Hours As Needed 10)  Calcium Citrate-Vitamin D 200-125 Mg-Unit Tabs (Calcium Citrate-Vitamin D) .... Once Daily 11)  Lisinopril 2.5 Mg Tabs (Lisinopril) .... Take 1 Tablet By Mouth Once A Day  Allergies (verified): 1)  ! Megace Oral (Megestrol Acetate) 2)  ! Lodine  Review of Systems       The patient complains of shortness of breath with activity.  The patient denies  shortness of breath at rest, productive cough, non-productive cough, coughing up blood, chest pain, irregular heartbeats, acid heartburn, indigestion, loss of appetite, weight change, abdominal pain, difficulty swallowing, sore throat, tooth/dental problems, headaches, nasal congestion/difficulty breathing through nose, sneezing, itching, ear ache, anxiety, depression, hand/feet swelling, joint stiffness or pain, rash, change in color of mucus, and fever.    Vital Signs:  Patient profile:   60 year old female Menstrual status:  postmenopausal Height:      65 inches Weight:      127.25 pounds BMI:     21.25 O2 Sat:      96 % on Room air Temp:     98.7 degrees F oral Pulse rate:   87 / minute BP sitting:   112 / 74  (left arm) Cuff size:   regular  Vitals Entered By: [name] (December 03, 2009 1:46 PM)  O2 Flow:  Room air CC: 3 month follow up. Pt states her breathing is doing okay. Pt states when it is cold she gets more sob.  Pt states no cough. Pt states she would like flu shot today.  Comments meds and allergies updated Phone number updated [name]  December 03, 2009 1:46 PM    Physical Exam  General:  wd female in nad Lungs:  a few pops and squeaks, good air movement, no wheezing Heart:  rrr, no mrg  Extremities:  no edema or cyanosis  Neurologic:  alert and oriented, moves all 4.   Impression & Recommendations:  Problem # 1:  REACTIVE AIRWAY DISEASE (ICD-493.90) the pt has a h/o dyspnea and cough with cold air exposure, but has no issues with breathing during the rest of the year.  I have always been concerned about occult asthma, but the pt has always done well with as needed albuterol.  I have also discussed with her trying foradil as a maintenance medication once a day during the cooler mos as well.  Her spirometry today does not show airflow obstruction, but I cannot 100% exclude occult asthma.  I have discussed this with the pt, and she would like to continue with as  needed meds rather than getting on combo therapy with ICS unless her symptoms change.  I do think we need to do spirometry yearly to make sure her flows are not worsening.  Other Orders: Admin 1st Vaccine [phone]) Flu Vaccine 44yr + ((91638 Est. Patient Level III ((46659 Spirometry w/Graph (94010)  Patient Instructions: 1)  will give you the flu shot today 2)  continue with proair as needed for cold air exposure, but can try foradil as needed for longer term exposures (12hrs). 3)  followup in one year.  Prescriptions: PROAIR HFA 108 (90 BASE) MCG/ACT  AERS (ALBUTEROL SULFATE) 2 puffs every 4-6 hours as needed  #1 x 11   Entered and Authorized by:   KKathee DeltonMD   Signed by:   KKathee DeltonMD on 12/03/2009   Method used:   Print then Give to Patient   RxID:   [id number]              Flu Vaccine Consent Questions     Do you have a history of severe allergic reactions to this vaccine? no    Any prior history of allergic reactions to egg and/or gelatin? no    Do you have a sensitivity to the preservative Thimersol? no    Do you have a past history of Guillan-Barre Syndrome? no    Do you currently have an acute febrile illness? no    Have you ever had a severe reaction to latex? no    Vaccine information given and explained to patient? yes    Are you currently pregnant? no    Lot Number:[id number]   Exp Date:08/22/2010   Site Given  Left Deltoid IMflu  MCharma Igo December 03, 2009 4:08 PM

## 2010-03-24 NOTE — Letter (Signed)
Summary: Previsit letter  [facility]  [address] [place], [place] [address]   Phone: [phone]  Fax: [phone]       04/18/2009 MRN: [id number]  [place], [place]  [address]  Dear Ms. Kenilworth,  Welcome to the Gastroenterology Division at [facility].    You are scheduled to see a nurse for your pre-procedure visit on 05-21-09 at 2:00p.m. on the 3rd floor at [facility] [place] [employer].  We ask that you try to arrive at our office 15 minutes prior to your appointment time to allow for check-in.  Your nurse visit will consist of discussing your medical and surgical history, your immediate family medical history, and your medications.    Please bring a complete list of all your medications or, if you prefer, bring the medication bottles and we will list them.  We will need to be aware of both prescribed and over the counter drugs.  We will need to know exact dosage information as well.  If you are on blood thinners (Coumadin, Plavix, Aggrenox, Ticlid, etc.) please call our office today/prior to your appointment, as we need to consult with your physician about holding your medication.   Please be prepared to read and sign documents such as consent forms, a financial agreement, and acknowledgement forms.  If necessary, and with your consent, a friend or relative is welcome to sit-in on the nurse visit with you.  Please bring your insurance card so that we may make a copy of it.  If your insurance requires a referral to see a specialist, please bring your referral form from your primary care physician.  No co-pay is required for this nurse visit.     If you cannot keep your appointment, please call [phone] to cancel or reschedule prior to your appointment date.  This allows [place] the opportunity to schedule an appointment for another patient in need of care.    Thank you for choosing [facility] for your medical  needs.  We appreciate the opportunity to care for you.  Please visit [place] at our website  to learn more about our practice.                     Sincerely.                                                                                                                   The Gastroenterology Division

## 2010-03-24 NOTE — Assessment & Plan Note (Signed)
Summary: 6 month rov/njr   Vital Signs:  Patient profile:   60 year old female Menstrual status:  postmenopausal Weight:      129 pounds Temp:     98.6 degrees F oral Pulse rate:   84 / minute Pulse rhythm:   regular Resp:     12 per minute BP sitting:   138 / 80  (left arm) Cuff size:   regular  Vitals Entered By: Rica Records, RN (July 23, 2009 11:36 AM) CC: 6 month rov--on trial of gabapentin in place of lyrica Is Patient Diabetic? No   Primary Care Provider:  [name] MD  CC:  6 month rov--on trial of gabapentin in place of lyrica.  History of Present Illness:  Follow-Up Visit      This is a 60 year old woman who presents for Follow-up visit.  The patient denies chest pain and palpitations.  Since the last visit the patient notes no new problems or concerns and being seen by a specialist.  The patient reports taking meds as prescribed and not monitoring BP.  When questioned about possible medication side effects, the patient notes none.  Chronic pain syndrome started neurontin---stopped lyrica.  All other systems reviewed and were negative   Preventive Screening-Counseling & Management  Alcohol-Tobacco     Smoking Status: never  Current Problems (verified): 1)  Hypertension  (ICD-401.9) 2)  Back Pain, Chronic  (ICD-724.5) 3)  Empyema W/fistula-resolved  (ICD-510.0) 4)  Physical Examination  (ICD-V70.0) 5)  Raynaud's Disease  (ICD-443.0) 6)  Osteopenia  (ICD-733.90) 7)  Hyperlipidemia  (ICD-272.4)  Current Medications (verified): 1)  Metronidazole 0.75 %  Lotn (Metronidazole) .... Apply At Bedtime As Needed 2)  Triamcinolone Acetonide 0.5 % Crea (Triamcinolone Acetonide) .... Apply 1 A Small Amount To Affected Area Twice A Day As Needed 3)  Multivitamins   Tabs (Multiple Vitamin) .... Once Daily 4)  Oxycodone-Acetaminophen 5-325 Mg Tabs (Oxycodone-Acetaminophen) .... Take 1 Tablet By Mouth Three Times A Day 5)  Gabapentin 400 Mg Caps (Gabapentin) .... Take 1  Tablet By Mouth Four Times A Day 6)  Cymbalta 60 Mg  Cpep (Duloxetine Hcl) .... Take 1 Capsule By Mouth At Bedtime 7)  Risaquad  Caps (Probiotic Product) .... Once Daily 8)  Lidoderm 5 % Ptch (Lidocaine) .... To Chest Wall--12 Hours On and 12 Hours Off 9)  Proair Hfa 108 (90 Base) Mcg/act  Aers (Albuterol Sulfate) .... 2 Puffs Every 4-6 Hours As Needed 10)  Calcium Citrate-Vitamin D 200-125 Mg-Unit Tabs (Calcium Citrate-Vitamin D) .... Once Daily 11)  Lisinopril 2.5 Mg Tabs (Lisinopril) .... Take 1 Tablet By Mouth Once A Day 12)  Glucosamine 1500 Complex  Caps (Glucosamine-Chondroit-Vit C-Mn) .... Once Daily 13)  Vitamin D .... (Unsure of Dosage) Take 1 Tablet By Mouth Once A Day 14)  Foradil Aerolizer 12 Mcg Caps (Formoterol Fumarate) .... One in Am and Pm Only As Needed  Allergies: 1)  ! Megace Oral (Megestrol Acetate) 2)  ! Lodine  Past History:  Past Medical History: Last updated: 03/27/2008 Hyperlipidemia Osteopenia C diff colitis, recurrent Raynauds pulmonary carcinoid--bronchopleural fistula  2004 h/o restrictive lung disease secondary to chest wall deformity assoc with above carcinoid surgery hyperreactive airways disease to cold and organic material in the care of horses  Past Surgical History: Last updated: 02/07/2008 multiple redos of surgeries---  thoracotomies    redo at [facility],  bronchopleural fistula  , pulmonary carcinoid Cholecystectomy  Family History: Last updated: 12/14/2006 adopted no known family history  Social History: Last updated: 12/14/2006 Married Never Smoked Regular exercise-no  Risk Factors: Exercise: no (12/14/2006)  Risk Factors: Smoking Status: never (07/23/2009)  Physical Exam  General:  alert and well-developed.   Head:  normocephalic and atraumatic.   Eyes:  pupils equal and pupils round.   Ears:  R ear normal and L ear normal.   Neck:  No deformities, masses, or tenderness noted. Chest Wall:  No deformities, masses, or  tenderness noted. surgical scar Lungs:  normal respiratory effort and no intercostal retractions.   rare external wheeze Heart:  normal rate and regular rhythm.   Abdomen:  soft and non-tender.   Msk:  No deformity or scoliosis noted of thoracic or lumbar spine.   Neurologic:  cranial nerves II-XII intact and gait normal.     Impression & Recommendations:  Problem # 1:  HYPERTENSION (ICD-401.9) reasonable control continue current medications  Her updated medication list for this problem includes:    Lisinopril 2.5 Mg Tabs (Lisinopril) .Marland Kitchen... Take 1 tablet by mouth once a day  BP today: 138/80 Prior BP: 122/76 (04/02/2009)  Labs Reviewed: K+: 4.8 (02/03/2009) Creat: : 0.7 (02/03/2009)   Chol: 238 (02/03/2009)   HDL: 47.70 (02/03/2009)   LDL: DEL (01/24/2008)   TG: 140.0 (02/03/2009)  Problem # 2:  BACK PAIN, CHRONIC (ICD-724.5) doing well on new medication regimen continue current medications  Her updated medication list for this problem includes:    Oxycodone-acetaminophen 5-325 Mg Tabs (Oxycodone-acetaminophen) .Marland Kitchen... Take 1 tablet by mouth three times a day  Problem # 3:  EMPYEMA W/FISTULA-RESOLVED (ICD-510.0) no recurrence  Problem # 4:  HYPERLIPIDEMIA (ICD-272.4) no treatment at this time Labs Reviewed: SGOT: 20 (02/03/2009)   SGPT: 15 (02/03/2009)   HDL:47.70 (02/03/2009), 49.3 (01/24/2008)  LDL:DEL (01/24/2008), DEL (12/07/2006)  Chol:238 (02/03/2009), 251 (01/24/2008)  Trig:140.0 (02/03/2009), 165 (01/24/2008)  Complete Medication List: 1)  Metronidazole 0.75 % Lotn (Metronidazole) .... Apply at bedtime as needed 2)  Triamcinolone Acetonide 0.5 % Crea (Triamcinolone acetonide) .... Apply 1 a small amount to affected area twice a day as needed 3)  Multivitamins Tabs (Multiple vitamin) .... Once daily 4)  Oxycodone-acetaminophen 5-325 Mg Tabs (Oxycodone-acetaminophen) .... Take 1 tablet by mouth three times a day 5)  Gabapentin 400 Mg Caps (Gabapentin) .... Take 1  tablet by mouth four times a day 6)  Cymbalta 60 Mg Cpep (Duloxetine hcl) .... Take 1 capsule by mouth at bedtime 7)  Risaquad Caps (Probiotic product) .... Once daily 8)  Lidoderm 5 % Ptch (Lidocaine) .... To chest wall--12 hours on and 12 hours off 9)  Proair Hfa 108 (90 Base) Mcg/act Aers (Albuterol sulfate) .... 2 puffs every 4-6 hours as needed 10)  Calcium Citrate-vitamin D 200-125 Mg-unit Tabs (Calcium citrate-vitamin d) .... Once daily 11)  Lisinopril 2.5 Mg Tabs (Lisinopril) .... Take 1 tablet by mouth once a day 12)  Glucosamine 1500 Complex Caps (Glucosamine-chondroit-vit c-mn) .... Once daily 13)  Vitamin D  .... (unsure of dosage) take 1 tablet by mouth once a day 14)  Foradil Aerolizer 12 Mcg Caps (Formoterol fumarate) .... One in am and pm only as needed  Patient Instructions: 1)  Please schedule a follow-up appointment in 6 months, CPX

## 2010-03-24 NOTE — Miscellaneous (Signed)
Summary: [facility]  Clinical Lists Changes  Medications: Added new medication of MOVIPREP 100 GM  SOLR (PEG-KCL-NACL-NASULF-NA ASC-C) As per prep instructions. - Signed Rx of MOVIPREP 100 GM  SOLR (PEG-KCL-NACL-NASULF-NA ASC-C) As per prep instructions.;  #1 x 0;  Signed;  Entered by: Ulice Dash RN;  Authorized by: Gatha Mayer MD, [facility];  Method used: Electronically to [facility] [phone]*, [address] [place], [place], [place]  [address], Ph: [phone] or [phone], Fax: [phone] Observations: Added new observation of ALLERGY REV: Done (05/21/2009 13:49)    Prescriptions: MOVIPREP 100 GM  SOLR (PEG-KCL-NACL-NASULF-NA ASC-C) As per prep instructions.  #1 x 0   Entered by:   Ulice Dash RN   Authorized by:   Gatha Mayer MD, [facility]   Signed by:   Ulice Dash RN on 05/21/2009   Method used:   Electronically to        [facility] [address]* (retail)       [address]       [place], [place]  [address]       Ph: [phone] or [phone]       Fax: [phone]   RxID:   [id number]

## 2010-03-24 NOTE — Letter (Signed)
Summary: Patient Notice- Polyp Results  [facility]  [place], [place] [address]   Phone: [phone]  Fax: [phone]        June 11, 2009 MRN: [id number]    [place], [place]  [address]    Dear Ms. Tarkio,  The polyp removed from your colon was adenomatous. This means that it was pre-cancerous or that  it had the potential to change into cancer over time.  I recommend that you have a repeat colonoscopy in 5 years to determine if you have developed any new polyps over time. If you develop any new rectal bleeding, abdominal pain or significant bowel habit changes, please contact us before then.  Please call us if you are having persistent problems or have questions about your condition that have not been fully answered at this time.   Sincerely,  Gatha Mayer MD, [facility]  This letter has been electronically signed by your physician.  Appended Document: Patient Notice- Polyp Results letter mailed 4.25.11

## 2010-03-24 NOTE — Letter (Signed)
Summary: [facility] Instructions  [facility]  [place], [place] [address]   Phone: [phone]  Fax: [phone]       [place]    12/03/1950    MRN: [id number]        Procedure Day /Date:  Friday 06/06/2009     Arrival Time: 9:30 am      Procedure Time: 10:30 am     Location of Procedure:                    _x _  [place] (4th Floor)                        [place]   Starting 5 days prior to your procedure Sunday 4/10 do not eat nuts, seeds, popcorn, corn, beans, peas,  salads, or any raw vegetables.  Do not take any fiber supplements (e.g. Metamucil, Citrucel, and Benefiber).  THE DAY BEFORE YOUR PROCEDURE         DATE: Thursday 4/14  1.  Drink clear liquids the entire day-NO SOLID FOOD  2.  Do not drink anything colored red or purple.  Avoid juices with pulp.  No orange juice.  3.  Drink at least 64 oz. (8 glasses) of fluid/clear liquids during the day to prevent dehydration and help the prep work efficiently.  CLEAR LIQUIDS INCLUDE: Water Jello Ice Popsicles Tea (sugar ok, no milk/cream) Powdered fruit flavored drinks Coffee (sugar ok, no milk/cream) Gatorade Juice: apple, white grape, white cranberry  Lemonade Clear bullion, consomm, broth Carbonated beverages (any kind) Strained chicken noodle soup Hard Candy                             4.  In the morning, mix first dose of MoviPrep solution:    Empty 1 Pouch A and 1 Pouch B into the disposable container    Add lukewarm drinking water to the top line of the container. Mix to dissolve    Refrigerate (mixed solution should be used within 24 hrs)  5.  Begin drinking the prep at 5:00 p.m. The MoviPrep container is divided by 4 marks.   Every 15 minutes drink the solution down to the next mark (approximately 8 oz) until the full liter is complete.   6.  Follow completed prep with 16 oz of clear liquid of your choice  (Nothing red or purple).  Continue to drink clear liquids until bedtime.  7.  Before going to bed, mix second dose of MoviPrep solution:    Empty 1 Pouch A and 1 Pouch B into the disposable container    Add lukewarm drinking water to the top line of the container. Mix to dissolve    Refrigerate  THE DAY OF YOUR PROCEDURE      DATE: Friday 4/15  Beginning at 5:30 a.m. (5 hours before procedure):         1. Every 15 minutes, drink the solution down to the next mark (approx 8 oz) until the full liter is complete.  2. Follow completed prep with 16 oz. of clear liquid of your choice.    3. You may drink clear liquids until 8:30 am (2 HOURS BEFORE PROCEDURE).   MEDICATION INSTRUCTIONS  Unless otherwise instructed, you should take regular prescription medications with a small sip of water   as early as possible the morning of  your procedure.           OTHER INSTRUCTIONS  You will need a responsible adult at least 58 years of age to accompany you and drive you home.   This person must remain in the waiting room during your procedure.  Wear loose fitting clothing that is easily removed.  Leave jewelry and other valuables at home.  However, you may wish to bring a book to read or  an iPod/MP3 player to listen to music as you wait for your procedure to start.  Remove all body piercing jewelry and leave at home.  Total time from sign-in until discharge is approximately 2-3 hours.  You should go home directly after your procedure and rest.  You can resume normal activities the  day after your procedure.  The day of your procedure you should not:   Drive   Make legal decisions   Operate machinery   Drink alcohol   Return to work  You will receive specific instructions about eating, activities and medications before you leave.    The above instructions have been reviewed and explained to me by   Ulice Dash RN  May 21, 2009 2:21 PM     I fully understand and  can verbalize these instructions _____________________________ Date _________

## 2010-03-24 NOTE — Progress Notes (Signed)
Summary: foradil  Phone Note Call from Patient Call back at Home Phone [phone]   Caller: Patient Call For: clance Reason for Call: Talk to Nurse, Talk to Doctor Summary of Call: Do you think [place]adil would be [place]?   She will also be more active while there.   She going out next month. Initial call taken by: Zigmund Gottron,  Jul 07, 2009 11:46 AM  Follow-up for Phone Call        pt states she will be going out [place] in 1 month and is concerned about her breathing in the higher elevations and is wanting to know if [facility] would be ok withher going back on Foradil. Pt states she took it in the past and did well on it. She states her breathing is fine now her in [place], but she is concerned about the effect of the higher elevations on her breathing. She states she will also be more active on this trip then she usually is. [facility]ase advise. Talmo Bing CMA  Jul 07, 2009 12:15 PM  [facility]  Additional Follow-up for Phone Call Additional follow up Details #1::        that is fine. foradil one in am and pm only as needed. #30, no fills....does she need more than 2 weeks worth? Additional Follow-up by: Kathee Delton MD,  Jul 07, 2009 5:27 PM    Additional Follow-up for Phone Call Additional follow up Details #2::    LMTCB. [place] Bing CMA  Jul 08, 2009 9:04 AM  Spoke with pt.  Pt informed [facility] is ok with her taking foradil one in am and one in pm as needed.  Pt states she will only be gone 1 wk and will call office back if she notices a "big" difference while on med to see if she can have refills.  Aware Foradil sent to [facility].  Raymondo Band RN  Jul 08, 2009 11:58 AM   New/Updated Medications: FORADIL AEROLIZER 12 MCG CAPS (FORMOTEROL FUMARATE) one in am and pm only as needed Prescriptions: FORADIL AEROLIZER 12 MCG CAPS (FORMOTEROL FUMARATE) one in am and pm only as needed  #30 x 0   Entered by:   Raymondo Band RN  Authorized by:   Kathee Delton MD   Signed by:   Raymondo Band RN on 07/08/2009   Method used:   Electronically to        [facility] [address]* (retail)       [address]       [place], [place]  [address]       Ph: [phone] or [phone]       Fax: [phone]   RxID:   [id number]

## 2010-03-24 NOTE — Assessment & Plan Note (Signed)
Summary: rov for rad, chest wall restriction.   Primary Provider/Referring Provider:  [name] MD  CC:  Pt is here for a 60yrf/u appt.  Pt states breathing "has worsened"  d/t the cold weather.  Pt states she is using her Proair 4 x a week.  Pt does c/o increased sob with exertion.  Pt denied a cough.  .  History of Present Illness: The pt comes in today for f/u of her dyspnea secondary from restriction from chest wall deformity, and also RAD primarily related to cold air exposure.  Overall , she feels that she has been doing fairly well.  She has uses her albuterol a little more lately due to cold temperatures.  She denies any chest tightness, wheezing, or cough/congestion.  She has wanted to stay away from maintenance meds unless absolutely necessary.  Current Medications (verified): 1)  Metronidazole 0.75 %  Lotn (Metronidazole) .... Apply At Bedtime As Needed 2)  Triamcinolone Acetonide 0.5 % Crea (Triamcinolone Acetonide) .... Apply 1 A Small Amount To Affected Area Twice A Day As Needed 3)  Multivitamins   Tabs (Multiple Vitamin) .... Once Daily 4)  Oxycodone-Acetaminophen 5-325 Mg Tabs (Oxycodone-Acetaminophen) .... Take 1 Tablet By Mouth Three Times A Day 5)  Lyrica 75 Mg  Caps (Pregabalin) .... Take 1 Tablet By Mouth Three Times A Day 6)  Cymbalta 60 Mg  Cpep (Duloxetine Hcl) .... Take 1 Capsule By Mouth At Bedtime 7)  Risaquad  Caps (Probiotic Product) .... Once Daily 8)  Lidoderm 5 % Ptch (Lidocaine) .... To Chest Wall--12 Hours On and 12 Hours Off 9)  Proair Hfa 108 (90 Base) Mcg/act  Aers (Albuterol Sulfate) .... 2 Puffs Every 4-6 Hours As Needed 10)  Calcium Citrate-Vitamin D 200-125 Mg-Unit Tabs (Calcium Citrate-Vitamin D) .... Once Daily 11)  Lisinopril 2.5 Mg Tabs (Lisinopril) .... Take 1 Tablet By Mouth Once A Day 12)  Glucosamine 1500 Complex  Caps (Glucosamine-Chondroit-Vit C-Mn) .... Once Daily 13)  Vitamin D .... (Unsure of Dosage) Take 1 Tablet By Mouth Once A  Day  Allergies (verified): 1)  ! Megace Oral (Megestrol Acetate) 2)  ! Lodine  Review of Systems      See HPI  Vital Signs:  Patient profile:   60year old female Menstrual status:  postmenopausal Height:      64.5 inches Weight:      129.50 pounds BMI:     21.96 O2 Sat:      100 % on Room air Temp:     98.0 degrees F oral Pulse rate:   98 / minute BP sitting:   122 / 76  (left arm) Cuff size:   regular  Vitals Entered By: MMatthew FolksLPN (February  9, 234191:30 PM)  O2 Flow:  Room air CC: Pt is here for a 89yr/u appt.  Pt states breathing "has worsened"  d/t the cold weather.  Pt states she is using her Proair 4 x a week.  Pt does c/o increased sob with exertion.  Pt denied a cough.   Comments Medications reviewed with patient MeMatthew FolksPN  February  9, 203790:30 PM    Physical Exam  General:  wd female in nad Lungs:  totally clear to auscultation Heart:  rrr Extremities:  no edema or cyanosis Neurologic:  alert and oriented, moves all 4.   Impression & Recommendations:  Problem # 1:  OTHER DISEASES OF RESPIRATORY SYSTEM NEC (ICD-519.8)  the pt has reactive airways disease  primarily related to cold air exposure.  She feels that she is doing well, and will therefore continue on same regimen.  I would like to recheck spirometry at next visit, and make sure she is maintaining her flows.  Problem # 2:  DYSPNEA (ICD-786.05)  the pt has chronic doe related to her chest wall deformity and abnormal pulmonary mechanics.  She is staying on a conditioning program.  Medications Added to Medication List This Visit: 1)  Vitamin D  .... (unsure of dosage) take 1 tablet by mouth once a day  Other Orders: Est. Patient Level II (03491)  Patient Instructions: 1)  stay on albuterol as needed with cold air exposure. 2)  followup with me in Oct, and will check spirometry at the visit.  Prescriptions: PROAIR HFA 108 (90 BASE) MCG/ACT  AERS (ALBUTEROL SULFATE) 2 puffs  every 4-6 hours as needed  #1 x 11   Entered and Authorized by:   Kathee Delton MD   Signed by:   Kathee Delton MD on 04/02/2009   Method used:   Print then Give to Patient   RxID:   [id number]

## 2010-03-24 NOTE — Procedures (Signed)
Summary: Colonoscopy  Patient: Deborah [name] Note: All result statuses are Final unless otherwise noted.  Tests: (1) Colonoscopy (COL)   COL Colonoscopy           [facility].     [place], [place]  [address]           COLONOSCOPY PROCEDURE REPORT           PATIENT:  Deborah [name], Deborah [name]  MR#:  [id number]     BIRTHDATE:  11/10/1950, 58 yrs. old  GENDER:  female     ENDOSCOPIST:  Gatha Mayer, MD, [facility]           PROCEDURE DATE:  06/06/2009     PROCEDURE:  Colonoscopy with snare polypectomy     ASA CLASS:  Class II     INDICATIONS:  history of pre-cancerous (adenomatous) colon polyps,     surveillance and high-risk screening 8 mm tubulovillous adenoma     removed from rectum 11/07     MEDICATIONS:   Fentanyl 75 mcg, Versed 7 mg IV           DESCRIPTION OF PROCEDURE:   After the risks benefits and     alternatives of the procedure were thoroughly explained, informed     consent was obtained.  Digital rectal exam was performed and     revealed no abnormalities.   The LB [id number]-[id number] endoscope     was introduced through the anus and advanced to the cecum, which     was identified by both the appendix and ileocecal valve, without     limitations.  The quality of the prep was adequate, using     MoviPrep.  The instrument was then slowly withdrawn as the colon     was fully examined. Insertion: 5:30 minutes Withdrawal: 14:50     minutes     <<PROCEDUREIMAGES>>           FINDINGS:  A diminutive polyp was found in the descending colon.     It was 5 mm in size. Polyp was snared without cautery. Retrieval     was successful.   This was otherwise a normal examination of the     colon.   Retroflexed views in the rectum revealed no     abnormalities.    The scope was then withdrawn from the patient     and the procedure completed.           COMPLICATIONS:  None     ENDOSCOPIC IMPRESSION:     1) 5 mm diminutive polyp in the descending colon -  removed     2) Otherwise normal examination, adequate prep     3) Prior tubulovillous adenoma removal 12/2005           RECOMMENDATIONS:     Try daily MiraLAx to help constipation problems (1/2-1 dose     daily).           REPEAT EXAM:  In for Colonoscopy, pending biopsy results.           Gatha Mayer, MD, [facility]           CC:  The Patient           n.     eSIGNED:   Gatha Mayer at 06/06/2009 11:45 AM           Kiser, Clarise Cruz, [phone]  Note: An exclamation mark Marland Kitchen)  indicates a result that was not dispersed into the flowsheet. Document Creation Date: 06/06/2009 11:46 AM _______________________________________________________________________  (1) Order result status: Final Collection or observation date-time: 06/06/2009 11:33 Requested date-time:  Receipt date-time:  Reported date-time:  Referring Physician:   Ordering Physician: Silvano Rusk [phone]) Specimen Source:  Source: Tawanna Cooler Order Number: [id number] Lab site:   Appended Document: Colonoscopy     Procedures Next Due Date:    Colonoscopy: 05/2014

## 2010-03-26 NOTE — Letter (Signed)
Summary: PAP and Mammogram Results Letters  PAP and Mammogram Results Letters   Imported By: Laural Benes 03/09/2010 09:54:20  _____________________________________________________________________  External Attachment:    Type:   Image     Comment:   External Document

## 2010-03-26 NOTE — Miscellaneous (Signed)
Summary: TB Skin Test/[facility]  TB Skin Test/[facility]   Imported By: Laural Benes 03/09/2010 09:58:50  _____________________________________________________________________  External Attachment:    Type:   Image     Comment:   External Document

## 2010-03-26 NOTE — Miscellaneous (Signed)
Summary: Flu shot/[facility]  Flu shot/[facility]   Imported By: Laural Benes 03/09/2010 09:57:16  _____________________________________________________________________  External Attachment:    Type:   Image     Comment:   External Document

## 2010-03-26 NOTE — Assessment & Plan Note (Signed)
Summary: CPX/MM---PT [place] (BMP) // RS   Vital Signs:  Patient profile:   60 year old female Menstrual status:  postmenopausal Height:      64.5 inches Weight:      127 pounds Temp:     98.2 degrees F oral Pulse rate:   100 / minute Pulse rhythm:   regular BP sitting:   142 / 90  (left arm) Cuff size:   regular  Vitals Entered By: Townsend Roger, [place] (March 04, 2010 10:41 AM)  Serial Vital Signs/Assessments:  Time      Position  BP       Pulse  Resp  Temp     By                     135/70                         [name] MD  CC: cpx, no pap   Primary Care Provider:  [name] MD  CC:  cpx and no pap.  History of Present Illness: CPX  Current Problems (verified): 1)  Reactive Airway Disease  (ICD-493.90) 2)  Hypertension  (ICD-401.9) 3)  Back Pain, Chronic  (ICD-724.5) 4)  Empyema W/fistula-resolved  (ICD-510.0) 5)  Physical Examination  (ICD-V70.0) 6)  Raynaud's Disease  (ICD-443.0) 7)  Osteopenia  (ICD-733.90) 8)  Hyperlipidemia  (ICD-272.4)  Current Medications (verified): 1)  Metronidazole 0.75 %  Lotn (Metronidazole) .... Apply At Bedtime As Needed 2)  Triamcinolone Acetonide 0.5 % Crea (Triamcinolone Acetonide) .... Apply 1 A Small Amount To Affected Area Twice A Day As Needed 3)  Multivitamins   Tabs (Multiple Vitamin) .... Once Daily 4)  Oxycodone-Acetaminophen 5-325 Mg Tabs (Oxycodone-Acetaminophen) .... Take 1 Tablet By Mouth Three Times A Day 5)  Gabapentin 400 Mg Caps (Gabapentin) .... Take 1 Tablet By Mouth Four Times A Day 6)  Cymbalta 60 Mg  Cpep (Duloxetine Hcl) .... Take 1 Capsule By Mouth At Bedtime 7)  Risaquad  Caps (Probiotic Product) .... Once Daily 8)  Lidoderm 5 % Ptch (Lidocaine) .... To Chest Wall--12 Hours On and 12 Hours Off 9)  Proair Hfa 108 (90 Base) Mcg/act  Aers (Albuterol Sulfate) .... 2 Puffs Every 4-6 Hours As Needed 10)  Calcium Citrate-Vitamin D 200-125 Mg-Unit Tabs (Calcium Citrate-Vitamin D) .... Once Daily 11)   Lisinopril 2.5 Mg Tabs (Lisinopril) .... Take 1 Tablet By Mouth Once A Day 12)  Foradil Aerolizer 12 Mcg Caps (Formoterol Fumarate) .... Once Daily As Needed  Allergies (verified): 1)  ! Megace Oral (Megestrol Acetate) 2)  ! Lodine  Past History:  Past Medical History: Last updated: 03/27/2008 Hyperlipidemia Osteopenia C diff colitis, recurrent Raynauds pulmonary carcinoid--bronchopleural fistula  2004 h/o restrictive lung disease secondary to chest wall deformity assoc with above carcinoid surgery hyperreactive airways disease to cold and organic material in the care of horses  Past Surgical History: Last updated: 02/07/2008 multiple redos of surgeries---  thoracotomies    redo at [facility],  bronchopleural fistula  , pulmonary carcinoid Cholecystectomy  Family History: Last updated: 12/14/2006 adopted no known family history  Social History: Last updated: 12/14/2006 Married Never Smoked Regular exercise-no  Risk Factors: Exercise: no (12/14/2006)  Risk Factors: Smoking Status: never (07/23/2009)   Impression & Recommendations:  Problem # 1:  PHYSICAL EXAMINATION (ICD-V70.0) health maint UTD  Problem # 2:  HYPERTENSION (ICD-401.9) reasonable control continue current medications  monitor BP---goal <130/85 Her updated medication list for  this problem includes:    Lisinopril 2.5 Mg Tabs (Lisinopril) .Marland Kitchen... Take 1 tablet by mouth once a day  Problem # 3:  HYPERLIPIDEMIA (ICD-272.4) advised regular exercise and low fat diet  no  treatment Labs Reviewed: SGOT: 22 (02/25/2010)   SGPT: 22 (02/25/2010)   HDL:53.50 (02/25/2010), 47.70 (02/03/2009)  LDL:DEL (01/24/2008), DEL (12/07/2006)  Chol:247 (02/25/2010), 238 (02/03/2009)  Trig:137.0 (02/25/2010), 140.0 (02/03/2009)  Problem # 4:  BACK PAIN, CHRONIC (ICD-724.5)  regular exercise.  will refer PT she sees pain specialist Her updated medication list for this problem includes:    Oxycodone-acetaminophen 5-325 Mg  Tabs (Oxycodone-acetaminophen) .Marland Kitchen... Take 1 tablet by mouth three times a day  Orders: Physical Therapy Referral (PT)  Complete Medication List: 1)  Metronidazole 0.75 % Lotn (Metronidazole) .... Apply at bedtime as needed 2)  Triamcinolone Acetonide 0.5 % Crea (Triamcinolone acetonide) .... Apply 1 a small amount to affected area twice a day as needed 3)  Multivitamins Tabs (Multiple vitamin) .... Once daily 4)  Oxycodone-acetaminophen 5-325 Mg Tabs (Oxycodone-acetaminophen) .... Take 1 tablet by mouth three times a day 5)  Gabapentin 400 Mg Caps (Gabapentin) .... Take 1 tablet by mouth four times a day 6)  Cymbalta 60 Mg Cpep (Duloxetine hcl) .... Take 1 capsule by mouth at bedtime 7)  Risaquad Caps (Probiotic product) .... Once daily 8)  Lidoderm 5 % Ptch (Lidocaine) .... To chest wall--12 hours on and 12 hours off 9)  Calcium Citrate-vitamin D 200-125 Mg-unit Tabs (Calcium citrate-vitamin d) .... Once daily 10)  Lisinopril 2.5 Mg Tabs (Lisinopril) .... Take 1 tablet by mouth once a day 11)  Foradil Aerolizer 12 Mcg Caps (Formoterol fumarate) .... Once daily as needed  Patient Instructions: 1)  schedule biopsy of right arm lesion Prescriptions: LISINOPRIL 2.5 MG TABS (LISINOPRIL) Take 1 tablet by mouth once a day  #90 Tablet x 4   Entered and Authorized by:   [name] MD   Signed by:   [name] MD on 03/04/2010   Method used:   Electronically to        [id number] (retail)       [address], [place]  [address]       Ph: [phone]       Fax: [phone]   RxID:   [id number] FORADIL AEROLIZER 12 MCG CAPS (FORMOTEROL FUMARATE) once daily as needed  #1 x 1   Entered and Authorized by:   [name] MD   Signed by:   [name] MD on 03/04/2010   Method used:   Electronically to        [id number]* (retail)       [address]       [place], [place]  [address]       Ph: [phone]       Fax: [phone]   RxID:   [id number]    Orders  Added: 1)  Est. Patient 40-64 years [[address]] 2)  Physical Therapy Referral [PT]    Physical Exam General Appearance: well developed, well nourished, no acute distress Eyes: conjunctiva and lids normal, PERRL, EOMI, Ears, Nose, Mouth, Throat: TM clear, nares clear, oral exam WNL Neck: supple, no lymphadenopathy, no thyromegaly, no JVD Respiratory: clear to auscultation and percussion, respiratory effort normal Cardiovascular: regular rate and rhythm, S1-S2, no murmur, rub or gallop, no bruits, peripheral pulses normal and symmetric, no cyanosis, clubbing, edema or varicosities Chest: no scars, masses, tenderness; no asymmetry, skin changes,  Gastrointestinal: soft, non-tender; no hepatosplenomegaly, masses; active bowel sounds all quadrants,  Lymphatic: no cervical, axillary or inguinal adenopathy Musculoskeletal: gait normal, muscle tone and strength WNL, no joint swelling, effusions, discoloration, crepitus  Skin: clear, good turgor, color WNL, no rashes, lesions, or ulcerations Neurologic: normal mental status, normal reflexes, normal strength, sensation, and motion Psychiatric: alert; oriented to person, place and time Other Exam:

## 2010-03-27 ENCOUNTER — Ambulatory Visit: Payer: BC Managed Care – PPO | Attending: Internal Medicine

## 2010-03-27 DIAGNOSIS — IMO0001 Reserved for inherently not codable concepts without codable children: Secondary | ICD-10-CM | POA: Insufficient documentation

## 2010-03-27 DIAGNOSIS — M2550 Pain in unspecified joint: Secondary | ICD-10-CM | POA: Insufficient documentation

## 2010-03-27 DIAGNOSIS — M6281 Muscle weakness (generalized): Secondary | ICD-10-CM | POA: Insufficient documentation

## 2010-03-27 DIAGNOSIS — M2560 Stiffness of unspecified joint, not elsewhere classified: Secondary | ICD-10-CM | POA: Insufficient documentation

## 2010-03-27 DIAGNOSIS — R293 Abnormal posture: Secondary | ICD-10-CM | POA: Insufficient documentation

## 2010-03-31 ENCOUNTER — Ambulatory Visit: Payer: BC Managed Care – PPO

## 2010-04-02 ENCOUNTER — Ambulatory Visit: Payer: BC Managed Care – PPO

## 2010-04-07 ENCOUNTER — Ambulatory Visit: Payer: BC Managed Care – PPO

## 2010-04-09 ENCOUNTER — Ambulatory Visit: Payer: BC Managed Care – PPO

## 2010-04-14 ENCOUNTER — Ambulatory Visit: Payer: BC Managed Care – PPO

## 2010-04-16 ENCOUNTER — Ambulatory Visit: Payer: BC Managed Care – PPO

## 2010-04-20 LAB — HM DEXA SCAN

## 2010-04-21 ENCOUNTER — Ambulatory Visit: Payer: BC Managed Care – PPO

## 2010-04-23 ENCOUNTER — Ambulatory Visit: Payer: BC Managed Care – PPO | Attending: Internal Medicine

## 2010-04-23 DIAGNOSIS — R293 Abnormal posture: Secondary | ICD-10-CM | POA: Insufficient documentation

## 2010-04-23 DIAGNOSIS — M2550 Pain in unspecified joint: Secondary | ICD-10-CM | POA: Insufficient documentation

## 2010-04-23 DIAGNOSIS — M2560 Stiffness of unspecified joint, not elsewhere classified: Secondary | ICD-10-CM | POA: Insufficient documentation

## 2010-04-23 DIAGNOSIS — M6281 Muscle weakness (generalized): Secondary | ICD-10-CM | POA: Insufficient documentation

## 2010-04-23 DIAGNOSIS — IMO0001 Reserved for inherently not codable concepts without codable children: Secondary | ICD-10-CM | POA: Insufficient documentation

## 2010-07-07 NOTE — Assessment & Plan Note (Signed)
[facility]                             PULMONARY OFFICE NOTE   Deborah [name], Deborah [name]                   MRN:          [id number]  DATE:01/04/2007                            DOB:          December 18, 1950    HISTORY OF PRESENT ILLNESS:  Patient is a 60 year old female whom I have  been asked to see for dyspnea.  The patient has a very extensive  pulmonary history that dates back to 2004, where she had a carcinoid  tumor with pulmonary resection.  This was followed by a series of  complications, including a bronchopleural fistula, as well as empyema.  The patient needed chronic chest tube drainage, and ultimately was seen  at [facility] for bronchopleural fistula repair.  This has left her with a  very extensive restrictive defect involving her left chest that has been  ongoing.  The patient states that she has dyspnea on exertion that is  much worse whenever she gets around cooler weather.  It is not  necessarily cold air exposure, and can result in shortness of breath,  even being in her house with an appropriate air temperature.  Patient  does not feel that her dyspnea is any worse than a year ago.  She states  it really does not interfere with her activities of daily living at  baseline.  However, when the air starts to get colder, it does begin to  encroach on her quality of life.  Patient denies any significant cough  or mucus production.  She has no known history of airways disease.   PAST MEDICAL HISTORY:  Significant for:  1. Dyslipidemia.  2. History of carcinoid tumor resection complicated by bronchopleural      fistula.  3. History of Raynaud's disease.   CURRENT MEDICATIONS:  Include:  1. Triamcinolone cream b.i.d.  2. Oxycodone/APAP 5/325 one t.i.d.  3. Brewer's yeast.  4. Lyrica 75 mg 1 q.i.d.  5. Cymbalta 60 mg nightly.  6. Flora-Q caps 1 daily.  7. Lidoderm 5% patch daily.   PATIENT DEVELOPS A RASH TO LODINE.   SOCIAL HISTORY:  The  patient is married, and has never smoked.  She  lives with her husband.  She is an unemployed Education officer, museum currently.   FAMILY HISTORY:  Unknown, since the patient is adopted.   REVIEW OF SYSTEMS:  As per history of present illness.  Also, see  patient intake form documented in the chart.   PHYSICAL EXAMINATION:  GENERAL:  She is a well-developed female in no  acute distress.  Blood pressure is 142/88.  Pulse 102.  Temperature is 98.  Weight is 136  pounds.  She is 5 feet 5 inches tall.  Her O2 saturation on room air is  94%.  HEENT:  Pupils equal, round, and reactive to light and accommodation.  Extraocular muscles are intact.  Nares are very small with septal  deviation to the right.  Oropharynx is clear.  NECK:  Supple without JVD or lymphadenopathy.  There is no palpable  thyromegaly.  CHEST:  Reveals significant decreased breath sounds on the left.  The  right  is clear, except for an isolated pop at the base.  There are no  wheezes.  CARDIAC EXAM:  Reveals regular rate and rhythm.  ABDOMEN:  Soft and nontender with good bowel sounds.  GENITAL EXAM:  Not done and not indicated.  RECTAL EXAM:  Not done and not indicated.  BREAST EXAM:  Not done and not indicated.  LOWER EXTREMITIES:  Without significant edema.  Pulses are intact  distally.  Her hands do appear to have changes consistent with sclerodactyly.  NEUROLOGIC:  She is alert and oriented, and moves all 4 extremities  without obvious observable motor defects.   IMPRESSION:  Dyspnea on exertion.  The patient believes is worse with  cooler weather.  That history is somewhat suggestive of the possibility  of airways disease, however, she does not have any other symptoms  consistent with this, and has no wheezing on exam.  She obviously has  significant restrictive disease and deconditioning, which can contribute  to her dyspnea on exertion.  I do think it would be beneficial to check  full pulmonary function tests, and  make sure there is not an airways  component to her disease.  Patient has also had a recent chest x-ray  that really does not show significant issues involving the right chest,  and her left chest changes appear to be chronic.  Patient did have a CT  recently at [facility] that I will try and get for further input.   PLAN:  1. We will check full PFTs to rule out the possibility of airways      disease.  2. Obtain patient's disc from [facility] for further evaluation.  3. I wonder if the patient would benefit from a few months in      pulmonary rehab to try and improve her strength and conditioning.      We will discuss that further on followup.     Kathee Delton, MD,FCCP  Electronically Signed    KMC/MedQ  DD: 01/11/2007  DT: 01/11/2007  Job #: [id number]   cc:   Darrick Penna. Swords, MD

## 2010-07-07 NOTE — Assessment & Plan Note (Signed)
[facility]                             PULMONARY OFFICE NOTE   SENAI, RAMNATH                   MRN:          [id number]  DATE:01/26/2007                            DOB:          February 04, 1951    Ms. Torelli comes in for followup of her recent PFTs.  The patient  was found to have no airflow obstruction on simple spirometry, although  the flow volume loop was somewhat suggestive of an occult airflow  obstruction.  Her lung volumes did show moderate to severe restriction  as expected, but also an elevated residual volume in proportion to her  total lung capacity that is suggestive of air trapping.  Her DLCO was  severely reduced, but corrected to normal with alveolar volume  adjustment.  This is not unexpected.  Unfortunately, we still have not  received her followup CT scan from [facility], despite sending  off the request.  We need to have these for our records, and I will re-  call them.   PHYSICAL EXAM:  BP 154/82, pulse 88, temperature 98.4, weight 137  pounds, O2 saturation on room air is 94%.   IMPRESSION:  Dyspnea on exertion that is secondary to moderate to severe  restrictive lung disease from her chest wall deformity and abnormal  mechanics.  I also think deconditioning is playing a role as well.  The  patient does not have any significant airflow obstruction, but does have  subtle signs that are suggestive of occult airflow obstruction.  This  combined with her history where cold air makes her worse may suggestive  of reactive airways disease.  I have no issue with trying her on a long-  acting bronchodilator to see if she notices a difference during this  winter.  The patient is agreeable to this approach.  She only has to be  willing to discontinue the medication if it really is not making a  difference.   PLAN:  1. We will start the patient on Foradil 1 puff b.i.d. for her to use      for the next 4 weeks, and  have provided samples for her.  She is to      continue if she feels that it helps, and may be able to come off of      this during the warmer months.  If she feels that it does not help,      she is to discontinue after the 1 month period.  2. Work aggressively on reconditioning.  I have offered to enroll the      patient in the pulmonary rehab program at [facility], and she is      going to go and see what it is all about, and whether she would      like to participate.  We will arrange this for her.  3. The patient will follow up with me in 6 months or sooner if there      are problems.     Kathee Delton, MD,FCCP  Electronically Signed    KMC/MedQ  DD: 01/26/2007  DT: 01/26/2007  Job #: [id number]   cc:   Darrick Penna. Swords, MD

## 2010-07-10 NOTE — H&P (Signed)
NAME:  Deborah [name], Deborah [name]                     ACCOUNT NO.:  [id number]   MEDICAL RECORD NO.:  [id number]                   PATIENT TYPE:  INP   LOCATION:                                       FACILITY:  [facility]   PHYSICIAN:  Nicanor Alcon, M.D.              DATE OF BIRTH:   DATE OF ADMISSION:  10/11/2002  DATE OF DISCHARGE:                                HISTORY & PHYSICAL   REFERRING PHYSICIAN:  Robert A. Alyson Ingles, M.D. and Tarri Fuller D. Young, M.D.   CHIEF COMPLAINT:  Bronchopleural fistula after resection of left lower lobe.   BRIEF HISTORY:  The patient is a 60 year old white female with recurrent  left and right lower lobe pneumonias.  She underwent resection of her left  lower lobe on 07/17/2002.  She was found to have a carcinoid tumor.  She was  discharged home five days later.  Since discharge, she has developed a  bronchopleural fistula with symptomatic fever and cough.  On 09/06/2002, she  underwent bronchoscopy, and fibrin tissue glue was applied to the  bronchopleural fistula and bronchoscopy.  Unfortunately, she has not sealed  and is admitted for left thoracotomy and closure of bronchopleural fistula,  probably with muscle flap.  The patient has ongoing pain in her left side  which is made worse if she bends over.  She sleeps in a chair sitting up to  prevent coughing secondary to fluid spillage from the chest.   PAST MEDICAL HISTORY:  1. Fractured ribs August 2000.  2. Irritable bowel syndrome.  3. __________  simplex chronicus.  4. Raynaud's disease about hands and feet and need to be kept warm during     surgery.  5. Rosacea.   PAST SURGICAL HISTORY:  1. Tonsillectomy and adenoidectomy.  2. Radial keratectomy in 1994.   CURRENT MEDICATIONS:  1. Avalox 1 daily.  2. Advil p.m. p.r.n. for pain.   ALLERGIES:  LODINE causes a rash and puffiness of her face.   REVIEW OF SYSTEMS:  No history of thyroid or swallowing problems.  She has  had a 10-pound weight loss  since her surgery back in May.  No history of  diabetes or kidney disease.  No history of asthma, COPD, TIA, or CVA.  No  history of syncope.  No history of amaurosis, coronary artery disease.  No  history of MI.  No arrhythmias.  No myocardial infarction.  No history of  pulmonary embolus or DVT.  No history of GI bleed or dysuria.  She has  constipation and diarrhea which comes and goes.  She has a chronic cough.  She develops dyspnea with walking.  She denies PND.  She does have  orthopnea, sleeps in a chair as noted above, to protect her airway and  sleep.  She also notes that her menstrual period is late, which is a new  phenomena for her.   FAMILY HISTORY:  Unknown.  The  patient was adopted.   SOCIAL HISTORY:  She is married for 20 years, and they have no children.  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  She has never used tobacco or alcohol.   PHYSICAL EXAMINATION:  GENERAL:  Thin, well nourished, well developed white  female in no acute distress.  HEENT:  Head: Normocephalic.  Eyes: PERRLA.  EOMs intact.  Fundi not  visualized.  Ears, nose, throat, and mouth grossly within normal limits.  VITAL SIGNS:  Temperature was 98.8, blood pressure 140/80 in the right arm  and 140/90 in the left arm, pulse 116 and regular.  CHEST:  Clear to auscultation and percussion bilaterally.  No wheezes,  rhonchi, or rales.  The incisions are well healed as are the chest tube  sites.  Respirations are unlabored.  CARDIAC:  Regular rate and rhythm.  No murmurs, rubs, or gallops.  ABDOMEN:  Soft, nontender, positive bowel sounds.  No palpable organomegaly.  GU/RECTAL:  Deferred.  EXTREMITIES:  No clubbing, cyanosis, or edema.  No ulcerations.  Pulses are  +2 throughout the distribution except for DP which was 0 to +1 bilaterally.  NEUROLOGIC:  Grossly within normal limits.   IMPRESSION:  1. Repeat fistula after resection of left lower lobe for  bronchopleural     fistula closure at this time.  2. Carcinoid left lower lobe.  3. Irritable bowel syndrome.  4. Raynaud's.  5. History of rosacea.   PLAN:  The patient is admitted now for elective closure of bronchopleural  fistula on the left.      Lydia Guiles, P.A.                 Nicanor Alcon, M.D.    WDJ/MEDQ  D:  10/09/2002  T:  10/09/2002  Job:  [id number]

## 2010-07-10 NOTE — Consult Note (Signed)
NAME:  Deborah [name], Deborah [name]                      ACCOUNT NO.:  [id number]   MEDICAL RECORD NO.:  [id number]                   PATIENT TYPE:  INP   LOCATION:  3307                                 FACILITY:  [facility]   PHYSICIAN:  Fenton Malling. Lucia Gaskins, M.D.               DATE OF BIRTH:  [birth year]   DATE OF CONSULTATION:  10/25/2002  DATE OF DISCHARGE:                                   CONSULTATION   REFERRING PHYSICIAN:  Dr. Nicanor Alcon.   REASON FOR CONSULTATION:  Clostridium difficile colitis with possible toxic  megacolon.   HISTORY OF ILLNESS:  This is a 60 year old white female who underwent a left  lower lobe resection on the 25th of May 2004 by Dr. Nicanor Alcon for a  carcinoid tumor.  She has a complicated postoperative course which has  included a bronchopleural fistula.  On the 15th of July 2004, she underwent  a bronchoscopy with a fibrin glue applied to the bronchopleural fistula for  attempt at closure.  It has not sealed and she was readmitted on the 19th of  August 2004 for a left thoracotomy for drainage of her left chest.   Since her hospitalization, she has been on several antibiotics.  She has had  some loose stools recently with some increasing abdominal distention.  She  has undergone a C. difficile which most recently, dated the 31st of August,  was positive.   At the same time she has had increasing abdominal distention, she has a  white blood count which is increased to 51,000.  Dr. Delfin Edis, who saw  her earlier today, was concerned about a toxic megacolon.  She has been  switched to oral vancomycin 250 mg four times a day.  She is getting TPN and  IV fluids.   ALLERGIES:  She has allergies to CODEINE, where it makes her nauseated;  LODINE, which makes her face puffy.   PAST MEDICAL HISTORY:  Her prior history, from a neurologic standpoint, no  history of seizure or loss of consciousness.   PULMONARY:  Again, all of her problems have been  complications from the left  thoracotomy and the bronchopleural fistula.   CARDIAC:  She has had no heart disease or chest pain or myocardial  infarction.   GI:  She has a history of irritable bowel before all this but no known liver  or colon disease.  No prior abdominal surgery or gynecological surgery.  She  has no children.   UROLOGIC:  No history of kidney stones or kidney infections.   PHYSICAL EXAMINATION:  VITAL SIGNS:  Her temperature is 97.2.  Pulse is 110.  Blood pressure is 130/60.  GENERAL:  She is a well-nourished, slightly tachypneic white female with a  flushed face.  She is able to give a history and talk and is coherent.  CARDIAC:  She is tachycardic on listening on her cardiac exam.  LUNGS:  Her lungs have decreased breath sounds.  She has two left chest  tubes.  Her right chest has some crackles in the base.  ABDOMEN:  Her abdomen is distended.  She has no guarding, no rebound.  There  are somewhat high-pitched bowel sounds.  RECTAL:  I did not do a rectal exam on her.  EXTREMITIES:  She has good strength in her upper and lower extremities.  Again, she has been in the bed.   LABORATORY DATA:  Her last labs show a white blood count of 51,000,  hemoglobin of 12.6, hematocrit 38.  Her prealbumin is 5.6.  Her sodium is  129, potassium 4.7, chloride of 98, CO2 of 25, glucose of 173, BUN is 10,  creatinine of 0.3.  Her total protein is 5.3 and albumin is 1.4; that is  from again the 2nd.  Calcium 7.6.   Her last CT of her chest dated the 29th of August:  She had a  hydropneumothorax on the left chest.  She had consolidation of the residual  left lower lobe.  She had scattered airspace disease in the left upper lobe,  some mediastinal adenopathy and a right pleural effusion.  She also has a  calcified cholelithiasis.   IMPRESSION:  1. Clostridium difficile colitis with abdominal distention.  Dr. Delfin Edis     has ordered a magnetic resonance imaging of her  abdomen.  I am not sure     how accurate this is when compared to a CT scan of the abdomen.  I think     the attempt was to avoid oral contrast for the patient; this is, I think,     a reasonable approach.  She has already been placed on vancomycin and     will need serial exams with serial laboratory work.  2. Left bronchopleural fistula, being managed by Dr. Baldemar Friday, and has two     chest tubes on the left side, which are certainly contributing to some of     her septic picture.  3. Right pleural effusion.  4. Cholelithiasis.  5. Malnutrition.                                                 Fenton Malling. Lucia Gaskins, M.D.    DHN/MEDQ  D:  10/25/2002  T:  10/26/2002  Job:  [id number]   cc:   Nicanor Alcon, M.D.  [address]  [place]  [place] [address]  Fax: [phone]   Delfin Edis, M.D. [facility]   Robert A. Alyson Ingles, M.D.  [address] [place]  [place] [address]  Fax: [phone]   Clinton D. Young, M.D.  [address] [place]  [place] [address]  Fax: [phone]

## 2010-07-10 NOTE — Op Note (Signed)
   NAME:  Deborah [name], Deborah [name]                      ACCOUNT NO.:  [id number]   MEDICAL RECORD NO.:  [id number]                   PATIENT TYPE:  INP   LOCATION:  3307                                 FACILITY:  [facility]   PHYSICIAN:  Nicanor Alcon, M.D.              DATE OF BIRTH:  1950-05-04   DATE OF PROCEDURE:  10/22/2002  DATE OF DISCHARGE:                                 OPERATIVE REPORT   PREOPERATIVE DIAGNOSIS:  Status post bronchopleural fistula repair of left  lower lobe bronchus; probable recurrent bronchopleural fistula with empyema.   POSTOPERATIVE DIAGNOSIS:  Recurrent bronchopleural fistula with empyema.   PROCEDURE:  1. Video bronchoscopy left. (VATS)  2. Drainage of empyema cavity with rib resection.   SURGEON:  Nicanor Alcon, M.D.   ASSISTANT:  Suzzanne Cloud, P.A.C.   This patient had a previous left lower lobectomy and then developed a late  bronchopleural fistula.  She was brought to the operating room and had  repair of this.  There was an abscess around the bronchus and this was  debrided, cleaned up, and drained.  After the chest tube was removed after 5  days she was doing well and then started developing fever and elevated white  count.  CT scan showed an air fluid level in this area and recurrent  bronchopleural fistula was suspected with infection.  She cultured out  Prevotella aureus and was started on antibiotics with Primaxin by infectious  disease.  She was brought to the operating room for drainage of this.   The fiberoptic bronchoscope was passed through the endotracheal tube and you  could see some pus coming from the left lower lobe bronchial stump.  This  was sent for culture.  Then the patient was turned with the head up, and  partially laterally, and the left chest was prepped and draped in the usual  sterile manner.  An incision was made about 1 interspace below the previous  thoracotomy incision. Dissection was carried down to the rib and a  3-cm  piece of the rib was removed to enter the empyema cavity.  The cavity was  evacuated and debrided and then 2 chest tubes were placed in the cavity and  sutured in place with #1 Prolene.  Dry and sterile dressing was applied.  The patient returned to the recovery room in serious condition.                                               Nicanor Alcon, M.D.    DPB/MEDQ  D:  10/22/2002  T:  10/22/2002  Job:  [id number]

## 2010-07-10 NOTE — H&P (Signed)
NAME:  Deborah [name], Deborah [name]                        ACCOUNT NO.:  [id number]   MEDICAL RECORD NO.:  [id number]                   PATIENT TYPE:  INP   LOCATION:  NA                                   FACILITY:  [facility]   PHYSICIAN:  Nicanor Alcon, M.D.              DATE OF BIRTH:  17-Nov-1950   DATE OF ADMISSION:  07/19/2002  DATE OF DISCHARGE:                                HISTORY & PHYSICAL   REASON FOR ADMISSION:  The patient is a 60 year old female referred by [facility] for evaluation of a left lower lobe nodule.  The patient has  had right upper lobe and right middle lobe pneumonias in March of this year  responding to protracted treatment with Augmentin/amoxicillin/Tequin. A  follow-up chest x-ray in April showed resolution of the right sided  pneumonia with the presence of a left lower lobe infiltrate and a 1.7 x 1.0  cm medial left lower lobe nodule not appropriate for needle biopsy.  PET  scan on Jun 26, 2002 shows faint focus of increased uptake in the  posteromedial aspect of the left lower lobe corresponding to the CT location  of the left lower lobe nodule. After consultation with Dr. Arlyce Dice, he  recommends for the patient resection of the left lower lobe nodule using the  left video assisted thoracoscopy technique.   PAST MEDICAL HISTORY:  1. Status post rib fracture in August 2000.  2. Irritable bowel syndrome symptoms.  3. Lichen simplex chronicus.  4. Raynaud's disease.  5. Rosacea.   PAST SURGICAL HISTORY:  1. Status post tonsillectomy.   MEDICATIONS:  1. Equalactin p.r.n.  2. Betamethasone valerate 0.1% topically as needed.   ALLERGIES:  1. LODINE which causes a rash and puffiness of the face.  2. CODEINE.   FAMILY HISTORY:  Unknown; she is an adopted child.   SOCIAL HISTORY:  She is married, has a supportive spouse and no children.  She is a Presenter, broadcasting.  She does not smoke and does not use  alcoholic beverages.   PHYSICAL  EXAMINATION:  GENERAL:  60 year-old white female in no  acute distress, alert and oriented times three and extremely anxious.  VITAL SIGNS:  Pulse is 88 and regular.  HEENT:  Normocephalic, atraumatic.  Eyes:  Pupils equal, round and reactive  to light.  Extraocular movements are intact.  No cataracts or glaucoma.  The  neck is supple with no jugular venous distention and no carotid bruits  auscultated.  LUNGS:  Clear to auscultation and percussion bilaterally.  HEART:  Regular rate and rhythm without murmurs, rubs or gallops.  ABDOMEN:  Abdomen is soft and non-distended.  Bowel sounds are present  throughout.  EXTREMITIES:  Show no evidence of cyanosis, clubbing, edema or ulcerations.  The patient says that they become extremely pallid when cold secondary to  her Raynaud's.  Pedal pulses is not palpated.  NEUROLOGIC:  Nonfocal.  Gait is steady.  Grip strength is 5/5 bilaterally.  No focal deficits.   IMPRESSION:  Left lower lobe nodule.   PLAN:  Left video assisted thoracoscopy and lymph node biopsy planned for  Jul 19, 2002 by Dr. Keturah Barre. Marlyn Corporal.     Sueanne Margarita, P.A.                    Nicanor Alcon, M.D.    GM/MEDQ  D:  07/17/2002  T:  07/17/2002  Job:  [id number]   cc:   Herbie [place] A. Alyson Ingles, M.D.  [address] [address] [place]  [place] [address]  Fax: [phone]   Clinton D. Young, M.D.  [address] [place]  [place] [address]  Fax: [phone]

## 2010-07-10 NOTE — Op Note (Signed)
NAME:  Deborah [name], Deborah [name]                        ACCOUNT NO.:  [id number]   MEDICAL RECORD NO.:  [id number]                   PATIENT TYPE:  INP   LOCATION:  3306                                 FACILITY:  [facility]   PHYSICIAN:  Nicanor Alcon, M.D.              DATE OF BIRTH:  19-Apr-1950   DATE OF PROCEDURE:  07/19/2002  DATE OF DISCHARGE:                                 OPERATIVE REPORT   PREOPERATIVE DIAGNOSIS:  Left lower lobe lesion.   POSTOPERATIVE DIAGNOSIS:  Carcinoid tumor left lower lobe.   OPERATION PERFORMED:  Thoracoscopy, left thoracotomy, wedge left lower lobe,  and left lower lobectomy.   SURGEON:  Nicanor Alcon, M.D.   FIRST ASSISTANT:  Jimmy Pounds, P.A.-C.   ANESTHESIA:  General.   After percutaneous insertion of all monitoring lines, the patient under  general anesthesia, he was prepped and draped in the usual sterile manner.  He was turned to the left lateral thoracotomy position and dual lumen tube  had been inserted.  This patient was found to have multiple community  acquired pneumoniae in the left lingula and right upper lobe, after being  treated with multiple antibiotics, a left lower lobe lesion was seen and a  CT scan revealed a 1.7 cm lesion in the left lower lobe.  PET scan was  positive in this area.  She was brought to the operating room and turned to  the left lateral thoracotomy position as mentioned.  Two trocar sites were  made in the anterior and posterior axillary line 7th intercostal space, two  trocars were inserted, and a 30 degree scope was inserted and exploration  was carried out with the scope.  The patient had minimal secretions and the  lungs appeared normal.  There were some areas of scar in the lingula which  is probably had her previous pneumonia.  There is no lesion seen in the left  lower lobe.  A third trocar site was made and secured.  An incision was made  in the midaxillary line approximately 3 cm in size and  exploration was  carried out and the inferior pulmonary ligament was divided and then this  showed the left lower lobe.  The lesion was felt to be palpable in the  posterior basilar segment of the left lower lobe.  This was resected with  the TS17 with four applications.  After the resection was done, we could not  really see the lesion in the area of the resection, so we ended up enlarging  this to about 6-7 cm and dividing the latissimus dorsi, reflecting the  serratus anterior.  This allowed [place] to try and palpate the lesion and it was  very hard to palpate in the left lung, and I could not be sure it was really  in the left lung but because the PET scan was positive and CT scan was  positive in this area, I  decided to go ahead and do a left lower lobectomy.  The tissue was divided with the EZ45 stapler resecting down the inferior  portion of the stapler.  Then, the pulmonary artery to the left lower lobe  was dissected out, there were several nodes around the pulmonary artery and  these were dissected free.  The base of the branches of the pulmonary artery  was looped with the vascular tape, stapled, and divided with the autosuture  stapler.  The superior segment branch to the left lower lobe was then  dissected out, ligated proximally and distally with 2-0 silk, clipped and  divided.  The inferior pulmonary vein was dissected out, looped with the  vascular tapes, and divided with the autosuture stapler.  Finally, the  bronchus was dissected and stapled with the TL30 stapler and divided.  The  left upper lobe was checked for air leaks and then the fissure.  There was  fairly significant leak in the fissure and so this could not be sutured and  a BioGlue was applied in the usual fashion to this area which did not stop  this leak completely but markedly decreased it.  Two chest tubes were  brought through the trocar sites and tied in place with 0 silk.  The chest  was closed with two  pericostals of #1 Vicryl in the muscle layer,  Marcaine catheters were placed medially and laterally and tied in place, the  muscle layers were closed with #1 Vicryl, the subcutaneous tissue with 2-0  Vicryl, and the skin with 3-0 Vicryl and Dermabond.  The patient returned to  the recovery room in satisfactory condition.                                               Nicanor Alcon, M.D.    DPB/MEDQ  D:  07/19/2002  T:  07/19/2002  Job:  [id number]

## 2010-07-10 NOTE — Discharge Summary (Signed)
NAME:  Deborah [name], Deborah [name]                        ACCOUNT NO.:  [id number]   MEDICAL RECORD NO.:  [id number]                   PATIENT TYPE:  INP   LOCATION:  3306                                 FACILITY:  [facility]   PHYSICIAN:  Sueanne Margarita, P.A.              DATE OF BIRTH:  12-15-1950   DATE OF ADMISSION:  DATE OF DISCHARGE:  07/23/2002                                 DISCHARGE SUMMARY   DISCHARGE DIAGNOSIS:  Carcinoid tumor, left-lower lobe.   SECONDARY DIAGNOSIS:  1. Recent history of pneumonia is both right and left lungs in March, 2004.  2. Status post rib fracture, August 2000.  3. Irritable bowel syndrome symptoms.  4. Lichen simplex chronicus.  5. Raynaud's syndrome.  6. Rosacea.   PROCEDURE:  On Jul 17, 2002, left video-assisted thoracoscopy, left  thoracotomy with resection, left-lower lobectomy, Dr. Pierre Bali,  Surgeon.  The patient tolerated the procedure well and was transferred  stable to the recovery room.   DISPOSITION:  The patient is ready for discharge on postoperative day #4.  She has had no difficulty coming off oxygen support, and was on room air by  postoperative day #2.  She was maintained on epidural catheter in the  postoperative period for analgesia, and complained of numbness affecting the  right thigh.  These paresthesias resolved quickly after removal of the  epidural catheter on postoperative day #2.  She had a small air leak in the  postoperative period which resolved rapidly.  She did not have major chest  tube drainage.  The anterior chest tube was removed on postoperative day #2,  and the posterior chest tube on postoperative day #3.  The patient has been  afebrile in the postoperative period, and has been ambulating independently.  Her incision is healing nicely.  The pain is now controlled with oral  analgesia.   MEDICATIONS:  She was ready to go home on May 31, on postoperative day #4,  on the following medications:  1. Ultram  50 mg one to two tablets p.o. q.4-6h. p.r.n. pain.  2. She is to resume her home medications which consist of Equalactin and     betamethasone valerate, 0.1% topically as needed.   BRIEF HISTORY:  The patient is a 60 year old female referred by [facility] for evaluation of a left-lower lobe nodule.  The patient has had  right upper lobe and right middle lobe pneumonia in March of this year  responding to protracted treatment with Augmentin/ amoxicillin/ Tequin.  A  follow up chest x-ray in April showed resolution of her right sided  pneumonia with the presence of left-lower-lobe infiltrates and the presence  of a 1.1 x 1 cm left lower lobe nodule in a position which was not  appropriate for a needle biopsy.  She saw Dr. Arlyce Dice in consultation, and he  recommended a PET scan.  This was done on Jun 26, 2002.  The study shows  faint traces of uptake in the posteromedial aspect of the left lower lobe  consistent with site of the left-lower-lobe nodule on chest x-ray and  computed tomogram.  Dr. Arlyce Dice in consultation recommended resection of this  left lower lobe nodule.  The patient has no prior history of myocardial  infarction, palpitations cerebrovascular accident, diabetes, coronary artery  disease, transient ischemic attacks, deep venous thrombosis, pulmonary  embolism.  She is currently no having hemoptysis, no fever or chills, no  recent weight loss, and no excessive sputum production.  She underwent  surgery Jul 19, 2002.   HOSPITAL COURSE:  As described in discharge.   DISPOSITION:  The patient is ready for discharge on postoperative day #5.  Her postoperative course has been uncomplicated.  She goes home with an  office visit with Dr. Arlyce Dice in one week.  His office will call with the  appointment.  She is to get a chest x-ray at [facility]  one hour before seeing Dr. Arlyce Dice.                                               Sueanne Margarita,  P.A.    GM/MEDQ  D:  07/22/2002  T:  07/22/2002  Job:  [id number]   cc:   Nicanor Alcon, M.D.  [address]  [place]  [place] [address]  Fax: [phone]   Audree Camel. Alyson Ingles, M.D.  [address] [place]  [place] [address]  Fax: [phone]   Clinton D. Young, M.D.  [address] [place]  [place] [address]  Fax: [phone]

## 2010-07-10 NOTE — Op Note (Signed)
NAME:  Deborah [name], Deborah [name]                      ACCOUNT NO.:  [id number]   MEDICAL RECORD NO.:  [id number]                   PATIENT TYPE:  INP   LOCATION:  2011                                 FACILITY:  [facility]   PHYSICIAN:  Nicanor Alcon, M.D.              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   OPERATION PERFORMED:  Bronchoscopy, left VATS, left thoracotomy, resection  of sixth rib, closure of bronchopleural fistula with intercostal muscle  flap.   SURGEON:  Nicanor Alcon, M.D.   FIRST ASSISTANT:  Suzzanne Cloud, P.A.   ANESTHESIA:  General.   This 60 year old female was found to have a left lower lobe lesion and  underwent a lobectomy in May with resection of a carcinoid tumor in the left  lower lobe.  Her postop course was benign.  She came back to the office and  was noted to have an air fluid level in the left space.  A bronchoscopy was  done which showed a small bronchopleural fistula.  This was tried to be  sealed with fibrin glue but it recurred.  The fistula remained patent.  It  had been recommended that this be closed surgically and the patient, at  first, was somewhat reluctant but then agreed to the surgery.  The risks of  the procedure were explained to the patient and she was brought to the  operating room.   After general anesthesia, a single lumen tube was inserted and the video  bronchoscope was inserted and the lesion could be seen in the lateral  portion of the left lower lobe bronchus.  The video bronchoscope was  removed.  The patient was turned to the left lateral thoracotomy position  and prepped and draped in the usual  sterile fashion.  The previous  thoracotomy incision was opened and the dissection was carried down through  the subcutaneous tissue dividing with electrocautery.  The latissimus dorsi  was split and the serratus was reflected anteriorly.  It was decided to take  the sixth rib.  You could see where the fifth intercostal space was entered  and a subperiosteal resection was taken of the sixth rib using using  Alexander periosteal elevator and Doyen periosteal elevators to resect the  sixth rib from the costal margins back to the angle.  The rib was resected  and dissection was carried posteriorly toward where a 2-3 cm space was  evident around the left lower lobe bronchus.  This space was identified and  sent for culture.  The patient had been placed on antibiotics  preoperatively.  The area of the fistula could be  identified and this was  closed with interrupted 3-0 Prolene in an interrupted fashion going through  the bronchus again and the surrounding tissue.  2-3 of these sutures were  placed and then the lung was expanded and no leak could be seen through the  under water.  We reinforced this area with two intercostal muscle bundles  were taken one from the sixth intercostal space and the other from the fifth  intercostal space.  These were taken down by subperiosteal dissection  dissecting the intercostal bundles subperiosteally off the ribs and dividing  them distally and preserving them proximally.  Both of these were pushed  down over the bronchial stump and sutured in place with 2-0 and 3-0 Vicryl  to cover the bronchial stump.  After this had been done, two chest tubes  were brought in, one from laterally at a previous chest tube site and  brought down to the bronchial stump and one more medially through a previous  chest tube site and brought down to  the bronchial stump.  The lung was re-expanded.  The chest was closed in  layers using chromic for the intercostal muscle bundles,  #1 Vicryl in the  muscle layer, 2-0 Vicryl in the subcutaneous tissue, and 3-0 Vicryl as a  subcuticular stitch.  The patient was returned to the recovery room in  stable condition.                                               Nicanor Alcon, M.D.    DPB/MEDQ  D:  03/12/2003  T:  03/12/2003  Job:  [id number]

## 2010-07-10 NOTE — Discharge Summary (Signed)
NAME:  Deborah [name], Deborah [name]                      ACCOUNT NO.:  [id number]   MEDICAL RECORD NO.:  [id number]                   PATIENT TYPE:  INP   LOCATION:  2011                                 FACILITY:  [facility]   PHYSICIAN:  Zella Richer. Alva Garnet, M.D. [facility]          DATE OF BIRTH:  [birth year]   DATE OF ADMISSION:  10/11/2002  DATE OF DISCHARGE:  12/04/2002                                 DISCHARGE SUMMARY   DISCHARGE DIAGNOSES:  1. Vent dependent respiratory failure.  2. Left video assisted thoracic surgery for bronchopleural fistula.  3. Empyema.  4. Pseudomembranous colitis with positive Clostridium difficile.  5. Edema.  6. Recurrent right effusion.  7. Debility.   HISTORY OF PRESENT ILLNESS:  Ms. Deborah [name] is a 60 year old white  female who underwent removal of a carcinoid tumor left lower lobe on Jul 17, 2002 via left video assisted thorascopic surgery and a resection of a left  lower lobectomy per Dr. Marlyn Corporal.  She remainder in the hospital for 5  days and discharged.  She returned on October 11, 2002 and was found to have  a bronchopleural fistula after resection of the left lower lobe.  She was  admitted for further evaluation and treatment.   PROCEDURE:  1. Orotracheal intubation from October 29, 2002 to November 08, 2002.  2. Right chest tube on October 24, 2002.  3. Left chest tube replacement with a 28 [place] on December 03, 2002 by Dr.     Marlyn Corporal.  4. Right thoracentesis per Dr. Alva Garnet on November 20, 2002.  5. Right thoracentesis November 14, 2002, by Dr. Asencion Noble.  6. Right thoracentesis by radiology service.  7. Right percutaneous indwelling central catheter on November 05, 2002.  8. On October 22, 2002, repeat video assisted thoracic excursion with     bronchoscopy and drainage of left empyema.  9. On October 11, 2002, she had a closure of an air leak and left video     assisted thoracic excursion, left thoracotomy approach by Dr.  Mollie Germany.   LABORATORY DATA:  Sodium 139, potassium 4.2, chloride 103, CO2 29, glucose  106, BUN 7, creatinine 0.4, calcium 8.9.  WBC 9.1, hemoglobin 9.4,  hematocrit 28.2, platelets are 526,000.  Prealbumin is 20.1.  AST is 17, ALT  is 13, ALP is 189, total bilirubin is 0.1.  Cholesterol level was 25.4.  TSH  was 1.776.  Free T4 is 1.27.  Pleural effusion on the right demonstrated  moderate mesothelial cells with a hazy color with WBC of 540, neutrophilic  count 4, lymph fluid of 88 macrophage 5, and EOS fluid of 3.  The protein is  3.2 and LDH is 180.  Blood cultures were negative x2.  Urine cultures showed  greater than 100,000 colonies of E. coli.  C. difficile was positive on  November 18, 2002, negative on November 28, 2002, negative on November 29, 2002.  ANA was 1.160; ANA was positive.  Cytopathology revealed right  pleural fluid,  thin prep, cell block no malignant cells identified.  Right  pleural fluid showed reactive mesothelial cells mixed with inflammatory  background, cell block pending.   RADIOGRAPHIC DATA:  Chest x-ray on December 03, 2002, shows no chest tube  remains in place, no pneumothorax seen.  The patient is undergoing left  thoracotomy, bibasilar infiltrates, atelectasis unchanged.  Small right  pleural effusion is stable.  Abdominal ultrasound performed on November 13, 2002, shows calcified cholelithiasis, the gallbladder might, in fact, have  sludge, right pleural effusion, small amount of right abdominal ascites.   HOSPITAL COURSE:  PROBLEM #1 -  PROLONGED VENTILATOR DEPENDENT RESPIRATORY  FAILURE:  [name] was admitted to [facility] by  Dr. Marlyn Corporal.  She underwent left thoracotomy with repair of  bronchopleural fistula on October 11, 2002, with subsequent return for repair  and drainage of empyema on October 22, 2002.  Her condition deteriorated and  she required mechanical ventilatory support and  orotracheal intubation on  October 29, 2002, and was successfully liberated from the vent for  November 08, 2002.  Due to long term requirements from prolonged  ventilatory support, he was transferred to the pulmonary critical care  service on November 01, 2002, with Dr. Arlyce Dice changing rolls to consulting.  She was successfully liberated from the vent and no longer requires  ventilatory support and, in fact, does not require O2 therapy at this time.   PROBLEM #2 -  LEFT VIDEO ASSISTED THORACIC SURGERY, BRONCHOPLEURAL FISTULA  WITH LEFT CHEST TUBE:  She had initially started out with two left chest  tubes and continues to have a left chest tube which was replaced on December 03, 2002, by Dr. Joya Gaskins for continuing air leak.  This will be evaluated on  an ongoing basis by Dr. Marlyn Corporal.   PROBLEM #3 -  EMPYEMA:  She was treated with drainage and long-term  Primaxin.   PROBLEM #4 -  PSEUDOMONAS COLITIS:  Treated with extensive course of  vancomycin and Flagyl with infectious disease and GI consults obtained.  She  remains on vancomycin at this time and this will be tapered over a prolonged  period.   PROBLEM #5 -  ANEMIA:  Anemia was addressed with transfusions.  She is no  longer anemic.   PROBLEM #6 -  RECURRENT RIGHT PLEURAL EFFUSION:  This was drained x3, one  time with a right chest tube per Dr. Arlyce Dice.  Currently she does continue to  have a small right effusion but does not require interventions at this time.   PROBLEM #7 -  DEBILITY:  Due to her prolonged hospitalization and multiple  surgical interventions along with mechanical ventilatory support, she will  require either a subacute care unit rehab or questionable nursing home  placement with rehab focus in the near future.   DISCHARGE MEDICATIONS:  1. Tylenol q.4h. p.r.n. 650 mg.  2. Anusol one p.r.n.  3. Vicodin one tablet q.6h. p.r.n.  4. Lactobacillus vulgaria one packet 8 a.m., 1 p.m., and 4 p.m.  5.  Multivitamins one daily. 6. Pepcid 20 mg q.12h.  7. Vancomycin p.o. 125 mg at 8 o'clock, 1300, 1600 and 2000.  8. Ferrous sulfate 325 mg t.i.d.   DIET:  Regular diet.   DISPOSITION ABDOMEN CONDITION ON DISCHARGE:  She continues to have a left  chest tube in place.  She continues to have a  left air leak secondary to her  bronchopleural fistula.  She is severely weakened from her prolonged  hospitalization and requires physical therapy and occupational therapy.  This will be addressed on an outpatient or inpatient basis, whichever is  deemed necessary.  She will be discharged in improved condition.      Richardson Landry Minor, A.C.N.P. [facility]                 Zella Richer. Alva Garnet, M.D. [facility]    SM/MEDQ  D:  12/04/2002  T:  12/04/2002  Job:  [id number]   cc:   Herbie Baltimore A. Alyson Ingles, M.D.  [address] [place]  [place] [address]  Fax: [phone]   Clinton D. Young, M.D.  [place]. [place] [address]  Fax: Fertile Michelle Nasuti., M.D.  [place]. [place]  [place] [address]  Fax: [phone]

## 2010-07-10 NOTE — Op Note (Signed)
   NAME:  Deborah [name], Deborah [name]                      ACCOUNT NO.:  [id number]   MEDICAL RECORD NO.:  [id number]                   PATIENT TYPE:  OIB   LOCATION:  2860                                 FACILITY:  [facility]   PHYSICIAN:  Nicanor Alcon, M.D.              DATE OF BIRTH:  11-18-1950   DATE OF PROCEDURE:  09/06/2002  DATE OF DISCHARGE:  09/06/2002                                 OPERATIVE REPORT   PREOPERATIVE DIAGNOSIS:  Possible bronchial and pleural fistula status post  left lower lobectomy.   POSTOPERATIVE DIAGNOSIS:  Possible bronchial and pleural fistula status post  left lower lobectomy.   OPERATION PERFORMED:  Video bronchoscopy.   After general anesthesia, the fiberoptic bronchoscope was passed through the  endotracheal tube, this was a video bronchoscope.  The patient had had a  previous left lower lobectomy for carcinoid tumor and then 3-4 weeks  postoperatively, he developed an air fluid level in the left chest after a  coughing spell.  The patient is suspected to have a bronchial stump link.  The carina was in the midline.  The right upper lobe, right middle lobe, and  right lower lobe orifices were normal.  The left main stem was normal.  The  left upper lobe was normal.  On the left lower lobe, you could see the  bronchial stump and there was about a 3 mm hole in the superior portion of  the left lower lobe.  It appears to be clean and there is no evidence of  drainage.  We decided to use fibrin glue to plug this hole that had been  previously described.  Through the video bronchoscope was passed a #6  Fogarty irrigating catheter and this was placed in the area where the hole  was and passed through the small 2-3 mm opening.  Then, 3 mL of tissue seal  fibrin glue was applied and it completely plugged the hole.  It was  irrigated to see if it would move and seemed to be very adherent to the  bronchus.  The video bronchoscope was removed.  The patient tolerated  the  procedure well and was returned to the recovery room in stable condition.                                               Nicanor Alcon, M.D.    DPB/MEDQ  D:  09/06/2002  T:  09/06/2002  Job:  [id number]

## 2010-11-26 ENCOUNTER — Encounter: Payer: Self-pay | Admitting: Pulmonary Disease

## 2010-12-02 ENCOUNTER — Ambulatory Visit (INDEPENDENT_AMBULATORY_CARE_PROVIDER_SITE_OTHER): Payer: BC Managed Care – PPO | Admitting: Pulmonary Disease

## 2010-12-02 ENCOUNTER — Encounter: Payer: Self-pay | Admitting: Pulmonary Disease

## 2010-12-02 DIAGNOSIS — J984 Other disorders of lung: Secondary | ICD-10-CM

## 2010-12-02 DIAGNOSIS — J45909 Unspecified asthma, uncomplicated: Secondary | ICD-10-CM

## 2010-12-02 NOTE — Assessment & Plan Note (Addendum)
The patient's spirometry today shows no airflow obstruction, and significant restriction.  Her FEV1 is actually improved from her last visit.  I would like for her to continue on her Foradil as needed for her cold air exposure.  Would continue to check yearly spirometry to make sure she is not developing airflow obstruction that would be more consistent with intrinsic asthma.

## 2010-12-02 NOTE — Patient Instructions (Signed)
Continue with foradil as needed.  May need to take twice a day consistently during your worse periods.  followup with me in one year, or call if having worsening symptoms.

## 2010-12-02 NOTE — Progress Notes (Signed)
  Subjective:    Patient ID: Deborah [name], female    DOB: [birth year], 60 y.o.   MRN: [id number]  HPI Patient comes in today for followup of her reactive airways disease, primarily with cold air exposure.  We have been monitoring her closely for possible true asthma, but her spirometry has not had a significant change.  She also has known restrictive disease from a chest wall deformity.  Since last visit, she has maintained on Foradil as needed, and feels that she has done well with this.  She has not had an acute exacerbation of her pulmonary infection since the last visit.  She feels that her exercise tolerance is near her usual baseline.   Review of Systems  Constitutional: Negative for fever and unexpected weight change.  HENT: Negative for ear pain, nosebleeds, congestion, sore throat, rhinorrhea, sneezing, trouble swallowing, dental problem, postnasal drip and sinus pressure.   Eyes: Positive for redness and itching.  Respiratory: Positive for chest tightness, shortness of breath and wheezing. Negative for cough.   Cardiovascular: Negative for palpitations and leg swelling.  Gastrointestinal: Negative for nausea and vomiting.  Genitourinary: Negative for dysuria.  Musculoskeletal: Negative for joint swelling.  Skin: Negative for rash.  Neurological: Positive for headaches.  Hematological: Does not bruise/bleed easily.  Psychiatric/Behavioral: Negative for dysphoric mood. The patient is not nervous/anxious.        Objective:   Physical Exam Thin female in no acute distress Nose without purulence or discharge noted Chest with decreased breath sounds on the left, clear breath sounds on the right with no wheezing Cardiac exam with regular rate and rhythm Lower extremities without edema, no cyanosis noted Alert and oriented, moves all 4 extremities       Assessment & Plan:

## 2011-03-01 ENCOUNTER — Other Ambulatory Visit (INDEPENDENT_AMBULATORY_CARE_PROVIDER_SITE_OTHER): Payer: BC Managed Care – PPO

## 2011-03-01 DIAGNOSIS — Z0000 Encounter for general adult medical examination without abnormal findings: Secondary | ICD-10-CM

## 2011-03-01 LAB — CBC WITH DIFFERENTIAL/PLATELET
Basophils Relative: 0.3 % (ref 0.0–3.0)
Eosinophils Relative: 1.9 % (ref 0.0–5.0)
Hemoglobin: 11 g/dL — ABNORMAL LOW (ref 12.0–15.0)
Lymphocytes Relative: 13.6 % (ref 12.0–46.0)
Monocytes Relative: 5.9 % (ref 3.0–12.0)
Neutro Abs: 5.7 10*3/uL (ref 1.4–7.7)
RBC: 3.83 Mil/uL — ABNORMAL LOW (ref 3.87–5.11)
WBC: 7.2 10*3/uL (ref 4.5–10.5)

## 2011-03-01 LAB — HEPATIC FUNCTION PANEL
AST: 17 U/L (ref 0–37)
Albumin: 4 g/dL (ref 3.5–5.2)
Alkaline Phosphatase: 102 U/L (ref 39–117)
Bilirubin, Direct: 0 mg/dL (ref 0.0–0.3)
Total Protein: 7.7 g/dL (ref 6.0–8.3)

## 2011-03-01 LAB — LIPID PANEL: Total CHOL/HDL Ratio: 5

## 2011-03-01 LAB — POCT URINALYSIS DIPSTICK
Blood, UA: NEGATIVE
Nitrite, UA: NEGATIVE
Urobilinogen, UA: 0.2
pH, UA: 5.5

## 2011-03-01 LAB — BASIC METABOLIC PANEL
CO2: 27 mEq/L (ref 19–32)
Calcium: 9.3 mg/dL (ref 8.4–10.5)
GFR: 99.38 mL/min (ref >60.00)
Sodium: 141 mEq/L (ref 135–145)

## 2011-03-02 LAB — FERRITIN: Ferritin: 74.8 ng/mL (ref 10.0–291.0)

## 2011-03-05 ENCOUNTER — Telehealth: Payer: Self-pay | Admitting: *Deleted

## 2011-03-05 MED ORDER — OSELTAMIVIR PHOSPHATE 75 MG PO CAPS: 75.0000 mg | ORAL_CAPSULE | Freq: Two times a day (BID) | ORAL | Status: AC

## 2011-03-05 NOTE — Telephone Encounter (Signed)
Notified pt. 

## 2011-03-05 NOTE — Telephone Encounter (Signed)
tamiflu 75 mg po bid x 5 days

## 2011-03-05 NOTE — Telephone Encounter (Signed)
Pt was here for lab work this week, and thinks she caught the flu.  Complaining of fever of 99.4, cough, headache and sore throat.  Would like either Tamiflu or an antibiotic.

## 2011-03-10 ENCOUNTER — Ambulatory Visit (INDEPENDENT_AMBULATORY_CARE_PROVIDER_SITE_OTHER): Payer: BC Managed Care – PPO | Admitting: Internal Medicine

## 2011-03-10 ENCOUNTER — Encounter: Payer: Self-pay | Admitting: Internal Medicine

## 2011-03-10 VITALS — BP 132/82 | HR 102 | Temp 99.1°F | Ht 64.5 in | Wt 130.0 lb

## 2011-03-10 DIAGNOSIS — Z0000 Encounter for general adult medical examination without abnormal findings: Secondary | ICD-10-CM

## 2011-03-10 DIAGNOSIS — D649 Anemia, unspecified: Secondary | ICD-10-CM

## 2011-03-10 LAB — IBC PANEL: Iron: 32 ug/dL — ABNORMAL LOW (ref 42–145)

## 2011-03-10 LAB — VITAMIN B12: Vitamin B-12: 315 pg/mL (ref 211–911)

## 2011-03-10 LAB — IRON: Iron: 32 ug/dL — ABNORMAL LOW (ref 42–145)

## 2011-03-10 LAB — FERRITIN: Ferritin: 74.5 ng/mL (ref 10.0–291.0)

## 2011-03-10 NOTE — Progress Notes (Signed)
Patient ID: Deborah [name], female   DOB: 03-06-1950, 61 y.o.   MRN: [id number] CPX  Past Medical History  Diagnosis Date  . Hyperlipidemia   . Osteopenia   . C. difficile colitis   . Raynaud's disease   . Restrictive lung disease   . Hyperactive airway disease     History   Social History  . Marital Status: Married    Spouse Name: N/A    Number of Children: N/A  . Years of Education: N/A   Occupational History  . Not on file.   Social History Main Topics  . Smoking status: Never Smoker   . Smokeless tobacco: Not on file  . Alcohol Use: Not on file  . Drug Use: Not on file  . Sexually Active: Not on file   Other Topics Concern  . Not on file   Social History Narrative  . No narrative on file    Past Surgical History  Procedure Date  . Broncho pleural fistula   . Pulmonary carcinoid   . Cholecystectomy   . Thoracotomies     No family history on file.  Allergies  Allergen Reactions  . Etodolac     REACTION: rash  . Megestrol Acetate     REACTION: rash    Current Outpatient Prescriptions on File Prior to Visit  Medication Sig Dispense Refill  . acidophilus (RISAQUAD) CAPS Take 1 capsule by mouth daily.        . Calcium Citrate-Vitamin D 200-125 MG-UNIT TABS Once a day       . DULoxetine (CYMBALTA) 60 MG capsule Take 60 mg by mouth daily.        . formoterol (FORADIL) 12 MCG capsule for inhaler Place 12 mcg into inhaler and inhale daily.       Marland Kitchen gabapentin (NEURONTIN) 400 MG capsule Take 400 mg by mouth 4 (four) times daily.        Marland Kitchen lidocaine (LIDODERM) 5 % Place 1 patch onto the skin daily. Remove & Discard patch within 12 hours or as directed by MD       . lisinopril (PRINIVIL,ZESTRIL) 2.5 MG tablet Take 2.5 mg by mouth daily.        . metroNIDAZOLE (METROCREAM) 0.75 % cream Apply 1 application topically as needed.        . Multiple Vitamin (MULTIVITAMIN PO) Once a day       . oseltamivir (TAMIFLU) 75 MG capsule Take 1 capsule (75 mg total) by mouth  2 (two) times daily.  10 capsule  0  . oxyCODONE-acetaminophen (PERCOCET) 5-325 MG per tablet Take 1 tablet by mouth 3 (three) times daily as needed.        . triamcinolone (KENALOG) 0.5 % cream Apply 1 application topically as needed.           patient denies chest pain, shortness of breath, orthopnea. Denies lower extremity edema, abdominal pain, change in appetite, change in bowel movements. Patient denies rashes, musculoskeletal complaints. No other specific complaints in a complete review of systems.   BP 132/82  Pulse 102  Temp(Src) 99.1 F (37.3 C) (Oral)  Ht 5' 4.5" (1.638 m)  Wt 130 lb (58.968 kg)  BMI 21.97 kg/m2  Well-developed well-nourished female in no acute distress. HEENT exam atraumatic, normocephalic, extraocular muscles are intact. Neck is supple. No jugular venous distention no thyromegaly. Chest clear to auscultation without increased work of breathing. Cardiac exam S1 and S2 are regular. Abdominal exam active bowel sounds, soft, nontender. Extremities  no edema. Neurologic exam she is alert without any motor sensory deficits. Gait is normal.  A/P Well Visit:  Health maint: UTD  Chronic pain---followed by Dr. Hardin Negus.

## 2011-03-16 ENCOUNTER — Other Ambulatory Visit: Payer: Self-pay | Admitting: *Deleted

## 2011-03-16 MED ORDER — METRONIDAZOLE 0.75 % EX CREA: 1.0000 "application " | TOPICAL_CREAM | CUTANEOUS | Status: DC | PRN

## 2011-03-16 MED ORDER — LISINOPRIL 2.5 MG PO TABS: 2.5000 mg | ORAL_TABLET | Freq: Every day | ORAL | Status: DC

## 2011-03-16 MED ORDER — TRIAMCINOLONE ACETONIDE 0.5 % EX CREA: 1.0000 "application " | TOPICAL_CREAM | CUTANEOUS | Status: DC | PRN

## 2011-03-16 MED ORDER — FORMOTEROL FUMARATE 12 MCG IN CAPS: 12.0000 ug | ORAL_CAPSULE | Freq: Every day | RESPIRATORY_TRACT | Status: DC

## 2011-03-16 MED ORDER — FERROUS SULFATE 325 (65 FE) MG PO TABS: 325.0000 mg | ORAL_TABLET | Freq: Every day | ORAL | Status: DC

## 2011-03-16 MED ORDER — ALBUTEROL SULFATE HFA 108 (90 BASE) MCG/ACT IN AERS: 2.0000 | INHALATION_SPRAY | Freq: Four times a day (QID) | RESPIRATORY_TRACT | Status: DC | PRN

## 2011-04-23 LAB — HM PAP SMEAR: HM Pap smear: NORMAL

## 2011-06-22 ENCOUNTER — Ambulatory Visit: Payer: BC Managed Care – PPO | Admitting: Internal Medicine

## 2011-06-29 ENCOUNTER — Encounter: Payer: Self-pay | Admitting: Internal Medicine

## 2011-06-29 ENCOUNTER — Ambulatory Visit (INDEPENDENT_AMBULATORY_CARE_PROVIDER_SITE_OTHER): Payer: BC Managed Care – PPO | Admitting: Internal Medicine

## 2011-06-29 VITALS — BP 124/74 | HR 112 | Temp 98.5°F | Wt 131.0 lb

## 2011-06-29 DIAGNOSIS — E785 Hyperlipidemia, unspecified: Secondary | ICD-10-CM

## 2011-06-29 DIAGNOSIS — I10 Essential (primary) hypertension: Secondary | ICD-10-CM

## 2011-06-29 DIAGNOSIS — D649 Anemia, unspecified: Secondary | ICD-10-CM

## 2011-06-29 LAB — CBC WITH DIFFERENTIAL/PLATELET
Basophils Relative: 0.2 % (ref 0.0–3.0)
Eosinophils Relative: 1 % (ref 0.0–5.0)
MCV: 86.5 fl (ref 78.0–100.0)
Monocytes Absolute: 0.6 10*3/uL (ref 0.1–1.0)
Monocytes Relative: 7.1 % (ref 3.0–12.0)
Neutrophils Relative %: 77.6 % — ABNORMAL HIGH (ref 43.0–77.0)
RBC: 4.18 Mil/uL (ref 3.87–5.11)
WBC: 8.3 10*3/uL (ref 4.5–10.5)

## 2011-06-29 NOTE — Progress Notes (Signed)
Patient ID: Deborah [name], female   DOB: 03-Jan-1951, 61 y.o.   MRN: [id number]  Back pain/chest pain-- followed by dr. Hardin Negus  Anemia-- iron deficient-- needs f/u  GYN-- has had PAP and mammogram  Past Medical History  Diagnosis Date  . Hyperlipidemia   . Osteopenia   . C. difficile colitis   . Raynaud's disease   . Restrictive lung disease   . Hyperactive airway disease     History   Social History  . Marital Status: Married    Spouse Name: N/A    Number of Children: N/A  . Years of Education: N/A   Occupational History  . Not on file.   Social History Main Topics  . Smoking status: Never Smoker   . Smokeless tobacco: Not on file  . Alcohol Use: Not on file  . Drug Use: Not on file  . Sexually Active: Not on file   Other Topics Concern  . Not on file   Social History Narrative  . No narrative on file    Past Surgical History  Procedure Date  . Broncho pleural fistula   . Pulmonary carcinoid   . Cholecystectomy   . Thoracotomies     No family history on file.  Allergies  Allergen Reactions  . Etodolac     REACTION: rash  . Megestrol Acetate     REACTION: rash    Current Outpatient Prescriptions on File Prior to Visit  Medication Sig Dispense Refill  . acidophilus (RISAQUAD) CAPS Take 1 capsule by mouth daily.        Marland Kitchen albuterol (PROVENTIL HFA;VENTOLIN HFA) 108 (90 BASE) MCG/ACT inhaler Inhale 2 puffs into the lungs every 6 (six) hours as needed.  3.7 g  5  . Calcium Citrate-Vitamin D 200-125 MG-UNIT TABS Once a day       . DULoxetine (CYMBALTA) 60 MG capsule Take 60 mg by mouth daily.        . ferrous sulfate 325 (65 FE) MG tablet Take 1 tablet (325 mg total) by mouth daily with breakfast.  90 tablet  0  . formoterol (FORADIL) 12 MCG capsule for inhaler Place 1 capsule (12 mcg total) into inhaler and inhale daily.  60 capsule  5  . gabapentin (NEURONTIN) 400 MG capsule Take 400 mg by mouth 4 (four) times daily.        Marland Kitchen lidocaine (LIDODERM) 5  % Place 1 patch onto the skin daily. Remove & Discard patch within 12 hours or as directed by MD       . lisinopril (PRINIVIL,ZESTRIL) 2.5 MG tablet Take 1 tablet (2.5 mg total) by mouth daily.  30 tablet  5  . metroNIDAZOLE (METROCREAM) 0.75 % cream Apply 1 application topically as needed.  45 g  5  . Multiple Vitamin (MULTIVITAMIN PO) Once a day       . triamcinolone cream (KENALOG) 0.5 % Apply 1 application topically as needed.  30 g  5     patient denies chest pain, shortness of breath, orthopnea. Denies lower extremity edema, abdominal pain, change in appetite, change in bowel movements. Patient denies rashes, musculoskeletal complaints. No other specific complaints in a complete review of systems.   BP 124/74  Pulse 112  Temp(Src) 98.5 F (36.9 C) (Oral)  Wt 131 lb (59.421 kg)  Well-developed well-nourished female in no acute distress. HEENT exam atraumatic, normocephalic, extraocular muscles are intact. Neck is supple. No jugular venous distention no thyromegaly. Chest clear to auscultation without increased work  of breathing. Cardiac exam S1 and S2 are regular. Abdominal exam active bowel sounds, soft, nontender. Extremities no edema.

## 2011-06-30 NOTE — Assessment & Plan Note (Signed)
She needs followup. Will schedule CBC and ferritin for today. She may be able to come off iron.

## 2011-09-28 ENCOUNTER — Encounter: Payer: Self-pay | Admitting: Pulmonary Disease

## 2011-09-28 ENCOUNTER — Ambulatory Visit (INDEPENDENT_AMBULATORY_CARE_PROVIDER_SITE_OTHER): Payer: BC Managed Care – PPO | Admitting: Pulmonary Disease

## 2011-09-28 VITALS — BP 120/70 | HR 108 | Temp 97.7°F | Ht 65.0 in | Wt 128.2 lb

## 2011-09-28 DIAGNOSIS — J45909 Unspecified asthma, uncomplicated: Secondary | ICD-10-CM

## 2011-09-28 DIAGNOSIS — J984 Other disorders of lung: Secondary | ICD-10-CM

## 2011-09-28 NOTE — Progress Notes (Signed)
  Subjective:    Patient ID: Deborah [name], female    DOB: 06/18/1950, 61 y.o.   MRN: [id number]  HPI The patient comes in today for followup after a recent episode of acute bronchitis versus pneumonia while traveling in [place].  She developed increasing shortness of breath, cough with purulence, and low-grade fever.  She was placed on Avelox for 10 days, and has done better since being on the antibiotic.  She currently has very little cough or mucus production, and feels that she is better.  She also notes that she's been having "wheezing" over the last 3 months, but it is unclear whether this is upper airway or lower airway in origin.  She is on an ACE inhibitor which can be stabilized her upper airway.   Review of Systems  Constitutional: Negative for fever and unexpected weight change.  HENT: Negative for ear pain, nosebleeds, congestion, sore throat, rhinorrhea, sneezing, trouble swallowing, dental problem, postnasal drip and sinus pressure.   Eyes: Negative for redness and itching.  Respiratory: Positive for cough, shortness of breath and wheezing. Negative for chest tightness.   Cardiovascular: Negative for palpitations and leg swelling.  Gastrointestinal: Negative for nausea and vomiting.  Genitourinary: Negative for dysuria.  Musculoskeletal: Negative for joint swelling.  Skin: Negative for rash.  Neurological: Negative for headaches.  Hematological: Does not bruise/bleed easily.  Psychiatric/Behavioral: Negative for dysphoric mood. The patient is not nervous/anxious.   All other systems reviewed and are negative.       Objective:   Physical Exam Thin female in no acute distress Nose without purulence or discharge noted Chest with decreased breath sounds on the left which is chronic, no wheezes, rhonchi, or crackles Cardiac exam with regular rate and rhythm Lower extremities without edema, no cyanosis Alert and oriented, moves all 4 extremities.        Assessment &  Plan:

## 2011-09-28 NOTE — Assessment & Plan Note (Signed)
There has been ongoing discussion whether the patient has reactive airways disease from cold air, or whether she actually has occult asthma with worsening symptoms with exposure to cold air.  Given her history of "wheezing", and also her issues with pulmonary infections every time she travels, I would like to give her a trial of a maintenance bronchodilator regimen to see if she sees a difference on a daily basis.  If not, then she can back to using albuterol as needed, and Foradil when she is going to have prolonged cold air exposure.

## 2011-09-28 NOTE — Patient Instructions (Addendum)
Finish up avelox  Will try you on dulera 100/5  2 inhalation am and pm for next 4-8 weeks.  Stop foradil.  Can still use albuterol for rescue if needed. Please rinse mouth real well after using dulera. Give me some feedback as to how things are going with new inhaler.  Keep scheduled followup with me.

## 2011-10-04 ENCOUNTER — Ambulatory Visit
Admission: RE | Admit: 2011-10-04 | Discharge: 2011-10-04 | Disposition: A | Discharge status: Home / Self Care | Payer: BC Managed Care – PPO | Source: Ambulatory Visit | Attending: Physician Assistant | Admitting: Physician Assistant

## 2011-10-04 ENCOUNTER — Other Ambulatory Visit: Payer: Self-pay | Admitting: Physician Assistant

## 2011-10-04 DIAGNOSIS — R0781 Pleurodynia: Secondary | ICD-10-CM

## 2011-10-11 ENCOUNTER — Telehealth: Payer: Self-pay | Admitting: Pulmonary Disease

## 2011-10-11 DIAGNOSIS — R05 Cough: Secondary | ICD-10-CM

## 2011-10-11 DIAGNOSIS — R059 Cough, unspecified: Secondary | ICD-10-CM

## 2011-10-11 NOTE — Telephone Encounter (Signed)
Spoke with pt. She states finished her avelox on 10-01-11 and while taking med, her symptoms improved some, but over the past 3-4 days they are the same as when seen at [facility]. She states that she has minimal brown sputum production in the am's and has had a low grade temp 99-100 for the past few nights. Would like [facility] to order a cxr. Please advise thanks! Allergies  Allergen Reactions  . Etodolac     REACTION: rash  . Megestrol Acetate     REACTION: rash

## 2011-10-11 NOTE — Telephone Encounter (Signed)
I am ok with her getting cxr in am.  Just put in computer for cough with fever. I will call her with results after I look at film.

## 2011-10-11 NOTE — Telephone Encounter (Signed)
Pt aware [place] agreed to have CXR done on patient. Order in computer.

## 2011-10-12 ENCOUNTER — Ambulatory Visit (INDEPENDENT_AMBULATORY_CARE_PROVIDER_SITE_OTHER)
Admission: RE | Admit: 2011-10-12 | Discharge: 2011-10-12 | Disposition: A | Discharge status: Home / Self Care | Payer: BC Managed Care – PPO | Source: Ambulatory Visit | Attending: Pulmonary Disease | Admitting: Pulmonary Disease

## 2011-10-12 DIAGNOSIS — R059 Cough, unspecified: Secondary | ICD-10-CM

## 2011-10-12 DIAGNOSIS — R05 Cough: Secondary | ICD-10-CM

## 2011-10-13 ENCOUNTER — Telehealth: Payer: Self-pay | Admitting: *Deleted

## 2011-10-13 NOTE — Telephone Encounter (Signed)
Called and spoke with patient regarding results of Chest xray per MW.  Patient verbalized understanding, declined antibiotic as of right now stating that she will call back tomorrow and let us know if she felt like she needed them then.  Result Note     Please let pt know that her cxr does show some streakiness in the top part of right lung, but it is unclear if this is left over from her process while in [place], or whether this is something new. If she is truly having fever, and feels more congested with purulent mucus, would treat her with a course of antibiotics.   Would call in levaquin 788m one a day for 7 days. Let [place] if not improving.

## 2011-10-13 NOTE — Progress Notes (Signed)
Quick Note:  Called and spoke with patient regarding results/recs as listed by Dr. Melvyn Novas. Patient verbalized understanding, refused antibiotic treatment as of right now, however stated that she would call us back in the morning and let us know if she felt she needed something then. ______

## 2011-10-14 NOTE — Telephone Encounter (Signed)
Pt states that she does have an elevated temp at night.  Pt is unsure if she should start an ABX or not.  Would like to discuss & asked to be reached at [phone].  Satira Anis

## 2011-10-14 NOTE — Telephone Encounter (Signed)
Spoke with pt. She states that her temp last night was 99.1 and this was after taking tylenol. She states that her congestion is no worse though, and only has prod cough in the am's with very minimal light brown sputum. She wants to know if [facility] thinks that she needs abx or not. OK with call back in the am. Please advise, thanks! Allergies  Allergen Reactions  . Etodolac     REACTION: rash  . Megestrol Acetate     REACTION: rash

## 2011-10-15 NOTE — Telephone Encounter (Signed)
I have no way of knowing if the cxr change is resolving pna from [place] (takes up to 8 weeks for cxr to clear after pna), or whether this is a new process.  If she is not having a lot of sob, no high fever (greater than 100.5), and no significant congestion or mucus production, I would be ok with holding off on abx.  If she worsens though, she needs to call.

## 2011-10-15 NOTE — Telephone Encounter (Signed)
Called and spoke with pt and gave her [place] recs.  Pt voiced her understanding of these recs and stated that in the evening her temp is only 99.1 and during the day it is normal.  She is aware to call back for any questions or concerns. Nothing further is needed at this time.

## 2011-10-19 ENCOUNTER — Other Ambulatory Visit: Payer: Self-pay | Admitting: Internal Medicine

## 2011-10-29 ENCOUNTER — Telehealth: Payer: Self-pay | Admitting: Pulmonary Disease

## 2011-10-29 DIAGNOSIS — J984 Other disorders of lung: Secondary | ICD-10-CM

## 2011-10-29 NOTE — Telephone Encounter (Signed)
Pt made aware nad I have asked her to request that her ct images be put on a disk and sent to Dr. Gwenette Greet. Patient says her congestion and cough have not changed. She still has brownish sputum and says she has been running a low grade fever for the past 2-3 days and feels lousy in general. She has had slight increased DOE as well.   I did speak with [place] and he says he only had images that were printed and needs the actual ct scans asap before starting any treatment. I have called the pt back to see if there would be any way she could go to [facility] to pick up the scans and bring them to the office but had to leave a msg for the pt to return my call. [place] says this may be faster than waiting for them to come in the mail.   KC pls see update on pt's symptoms above. We are still awaiting a call back from the pt regarding the ct scans.

## 2011-10-29 NOTE — Telephone Encounter (Signed)
I spoke with pt and is aware of [place] recs. I have placed order in [facility]. She will try and come and do this today. Nothing further was needed

## 2011-10-29 NOTE — Telephone Encounter (Signed)
Please let pt know that I have reviewed her note and the included images.  I really need to see the whole ct to know what may be going on.  If she is having worsening congestion, worse cough with purulent mucus of significance, she may need abx until I can review her films.  Otherwise, can hold off on further treatment until I get her ct and can review.  Thanks.

## 2011-10-29 NOTE — Telephone Encounter (Signed)
lmomtcb x1 

## 2011-10-29 NOTE — Telephone Encounter (Signed)
I think what will benefit [place] the most is to culture her mucus.  She has been on abx, and still has symptoms.  She may have an organism that is resistant to the abx she has received, especially with her chronic lung disease.  1) sputum c/s 2) sputum for AFB smear and culture. See if she can get this done today, then we can treat her early next week once the results are available if she remains stable.   I will call her once I get culture results, and after reviewing her ct.

## 2011-11-02 ENCOUNTER — Telehealth: Payer: Self-pay | Admitting: Pulmonary Disease

## 2011-11-02 NOTE — Telephone Encounter (Signed)
RECEIVED DISC CT OF CHEST ON PT. PLACED ON KC'S DESK OR REVIEW

## 2011-11-03 ENCOUNTER — Other Ambulatory Visit: Payer: BC Managed Care – PPO

## 2011-11-03 ENCOUNTER — Other Ambulatory Visit: Payer: Self-pay | Admitting: Pulmonary Disease

## 2011-11-03 DIAGNOSIS — J984 Other disorders of lung: Secondary | ICD-10-CM

## 2011-11-06 LAB — RESPIRATORY CULTURE OR RESPIRATORY AND SPUTUM CULTURE: Organism ID, Bacteria: NORMAL

## 2011-11-08 ENCOUNTER — Telehealth: Payer: Self-pay | Admitting: Pulmonary Disease

## 2011-11-08 NOTE — Telephone Encounter (Signed)
Please call [facility] and have them send me the most recent ct chest report on this patient.  Need ASAP if possible. Thanks.

## 2011-11-09 NOTE — Telephone Encounter (Signed)
Request for chest ct report faxed to [facility] @ [phone]

## 2011-11-09 NOTE — Telephone Encounter (Signed)
Chest ct report received from [facility] and placed in [facility] look at folder.

## 2011-11-10 ENCOUNTER — Telehealth: Payer: Self-pay | Admitting: Pulmonary Disease

## 2011-11-10 NOTE — Telephone Encounter (Signed)
Have reviewed ct from [facility] and prior reports.  She has new small patchy GGO that appear more infectious or inflammatory than anything else.  Are not solid, therefore unlikely a recurrence of her carcinoid.  The pt is feeling better, and is scheduled to see me first week of October.  She is to call if her symptoms worsen before then .

## 2011-12-01 ENCOUNTER — Ambulatory Visit (INDEPENDENT_AMBULATORY_CARE_PROVIDER_SITE_OTHER): Payer: BC Managed Care – PPO | Admitting: Pulmonary Disease

## 2011-12-01 ENCOUNTER — Encounter: Payer: Self-pay | Admitting: Pulmonary Disease

## 2011-12-01 VITALS — BP 120/60 | HR 118 | Temp 98.5°F | Ht 65.0 in | Wt 125.6 lb

## 2011-12-01 DIAGNOSIS — J479 Bronchiectasis, uncomplicated: Secondary | ICD-10-CM | POA: Insufficient documentation

## 2011-12-01 DIAGNOSIS — J984 Other disorders of lung: Secondary | ICD-10-CM

## 2011-12-01 DIAGNOSIS — R918 Other nonspecific abnormal finding of lung field: Secondary | ICD-10-CM

## 2011-12-01 NOTE — Assessment & Plan Note (Signed)
The patient has coronary infiltrates on her most recent CT PET or groundglass opacities.  They appear more inflammatory or infectious.  She does have a history of carcinoid, but this would typically present with solid nodules rather than groundglass.  The patient is continuing to have cough and congestion, and is bringing up purulent mucus despite being treated with antibiotics.  I wonder how much of this is secondary to bronchiectasis, or whether we have treated her with the appropriate antibiotics.  Unfortunately, her sputum culture was not helpful.  I am concerned that she is having low-grade temps while taking Tylenol daily.  At this point, think she would benefit from bronchoscopy to try and get a good specimen for culture, and also help her with pulmonary hygiene.  The patient is agreeable.

## 2011-12-01 NOTE — Patient Instructions (Addendum)
Will schedule for bronchoscopy to try and better culture material and help with airway clearance.  Please see if Tues or Wed of next week will work for you.  Would need to be there approx 600am. No change in meds currently.

## 2011-12-01 NOTE — Progress Notes (Signed)
  Subjective:    Patient ID: Deborah [name], female    DOB: 01/25/1951, 61 y.o.   MRN: [id number]  HPI Patient comes in today for an acute sick visit.  She has not returned to her baseline since an episode of pneumonia while traveling back in mid August.  She has been treated with antibiotics with no improvement.  She has had a recent sputum culture that showed many different organisms, but did not corona any specific one.  The patient has continued to have some rattling congestion, cough with purulent mucus, and also general malaise.  She has not had any fever, but takes Tylenol on a regular basis as part of her chronic pain syndrome.  She has had a followup CT at [facility], and this showed some patchy groundglass opacities.  There were no definite infiltrates to suggest pneumonia that were new.  It is unclear how much of these are inflammatory versus infectious.   Review of Systems  Constitutional: Negative for fever and unexpected weight change.  HENT: Positive for congestion and rhinorrhea. Negative for ear pain, nosebleeds, sore throat, sneezing, trouble swallowing, dental problem, postnasal drip and sinus pressure.   Eyes: Negative for redness and itching.  Respiratory: Positive for cough and shortness of breath. Negative for chest tightness and wheezing.   Cardiovascular: Negative for palpitations and leg swelling.  Gastrointestinal: Negative for nausea and vomiting.  Genitourinary: Negative for dysuria.  Musculoskeletal: Negative for joint swelling.  Skin: Negative for rash.  Neurological: Positive for headaches.  Hematological: Does not bruise/bleed easily.  Psychiatric/Behavioral: Positive for dysphoric mood. The patient is nervous/anxious.        Objective:   Physical Exam Thin female in no acute distress Nose without purulence or discharge noted Neck without adenopathy or thyromegaly Chest with decreased breath sounds on the left, scattered crackles.  No wheezing  noted Cardiac exam with regular rate and rhythm Lower extremities without edema, no cyanosis Alert and oriented, moves all 4 extremities.       Assessment & Plan:

## 2011-12-02 ENCOUNTER — Telehealth: Payer: Self-pay | Admitting: Pulmonary Disease

## 2011-12-02 NOTE — Telephone Encounter (Signed)
Pt called back and states that Tuesday next week is the day she can do the bronch. Please advise. [place] Bing, CMA

## 2011-12-02 NOTE — Telephone Encounter (Signed)
Bronch has been scheduled for Tues., 12/07/11 @ 8am. Pt will need to arrive at 6:45am and go to admitting at [facility] Pt needs to be NPO after midnight and someone needs to be with her to drive her home because of light sedation. LMOMTCB x 1 for the pt.

## 2011-12-02 NOTE — Telephone Encounter (Signed)
Pt is aware of bronch date and time and was given instructions such as NPO after midnight and to have someone with her to drive. Pt verbalized understanding.

## 2011-12-06 ENCOUNTER — Encounter (HOSPITAL_COMMUNITY): Payer: Self-pay

## 2011-12-07 ENCOUNTER — Ambulatory Visit (HOSPITAL_COMMUNITY)
Admission: RE | Admit: 2011-12-07 | Discharge: 2011-12-07 | Disposition: A | Discharge status: Home / Self Care | Payer: BC Managed Care – PPO | Source: Ambulatory Visit | Attending: Pulmonary Disease | Admitting: Pulmonary Disease

## 2011-12-07 ENCOUNTER — Encounter (HOSPITAL_COMMUNITY): Payer: Self-pay | Admitting: Respiratory Therapy

## 2011-12-07 ENCOUNTER — Encounter (HOSPITAL_COMMUNITY)
Admission: RE | Disposition: A | Discharge status: Home / Self Care | Payer: Self-pay | Source: Ambulatory Visit | Attending: Pulmonary Disease

## 2011-12-07 DIAGNOSIS — R918 Other nonspecific abnormal finding of lung field: Secondary | ICD-10-CM | POA: Insufficient documentation

## 2011-12-07 DIAGNOSIS — R0602 Shortness of breath: Secondary | ICD-10-CM | POA: Insufficient documentation

## 2011-12-07 DIAGNOSIS — R51 Headache: Secondary | ICD-10-CM | POA: Insufficient documentation

## 2011-12-07 DIAGNOSIS — R059 Cough, unspecified: Secondary | ICD-10-CM

## 2011-12-07 DIAGNOSIS — J3489 Other specified disorders of nose and nasal sinuses: Secondary | ICD-10-CM | POA: Insufficient documentation

## 2011-12-07 DIAGNOSIS — Z8701 Personal history of pneumonia (recurrent): Secondary | ICD-10-CM | POA: Insufficient documentation

## 2011-12-07 DIAGNOSIS — R05 Cough: Secondary | ICD-10-CM

## 2011-12-07 HISTORY — PX: VIDEO BRONCHOSCOPY: SHX5072

## 2011-12-07 LAB — BODY FLUID CELL COUNT WITH DIFFERENTIAL
Eos, Fluid: 0 %
Lymphs, Fluid: 11 %
Neutrophil Count, Fluid: 88 % — ABNORMAL HIGH (ref 0–25)
Total Nucleated Cell Count, Fluid: 1698 cu mm — ABNORMAL HIGH (ref 0–1000)

## 2011-12-07 SURGERY — VIDEO BRONCHOSCOPY WITHOUT FLUORO
Anesthesia: Moderate Sedation | Laterality: Bilateral

## 2011-12-07 MED ORDER — MEPERIDINE HCL 100 MG/ML IJ SOLN
INTRAMUSCULAR | Status: AC
  Filled 2011-12-07: qty 2

## 2011-12-07 MED ORDER — LIDOCAINE HCL 2 % EX GEL
CUTANEOUS | Status: DC | PRN
  Administered 2011-12-07: 1

## 2011-12-07 MED ORDER — PHENYLEPHRINE HCL 0.25 % NA SOLN
NASAL | Status: DC | PRN
  Administered 2011-12-07: 1 via NASAL

## 2011-12-07 MED ORDER — LIDOCAINE HCL 1 % IJ SOLN
INTRAMUSCULAR | Status: DC | PRN
  Administered 2011-12-07: 6 mL

## 2011-12-07 MED ORDER — MEPERIDINE HCL 25 MG/ML IJ SOLN
INTRAMUSCULAR | Status: DC | PRN
  Administered 2011-12-07: 50 mg via INTRAVENOUS

## 2011-12-07 MED ORDER — MIDAZOLAM HCL 10 MG/2ML IJ SOLN
INTRAMUSCULAR | Status: DC | PRN
  Administered 2011-12-07: 2.5 mg via INTRAVENOUS
  Administered 2011-12-07: 5 mg via INTRAVENOUS

## 2011-12-07 MED ORDER — MIDAZOLAM HCL 10 MG/2ML IJ SOLN
INTRAMUSCULAR | Status: AC
  Filled 2011-12-07: qty 4

## 2011-12-07 MED ORDER — SODIUM CHLORIDE 0.9 % IV SOLN
INTRAVENOUS | Status: DC
  Administered 2011-12-07: 08:00:00 via INTRAVENOUS

## 2011-12-07 NOTE — Interval H&P Note (Signed)
History and Physical Interval Note:  12/07/2011 7:46 AM  Deborah [name]  has presented today for surgery, with the diagnosis of Cough  The various methods of treatment have been discussed with the patient and family. After consideration of risks, benefits and other options for treatment, the patient has consented to  Procedure(s) (LRB) with comments: VIDEO BRONCHOSCOPY WITHOUT FLUORO (Bilateral) as a surgical intervention .  The patient's history has been reviewed, patient examined, no change in status, stable for surgery.  I have reviewed the patient's chart and labs.  Questions were answered to the patient's satisfaction.     Kathee Delton

## 2011-12-07 NOTE — Op Note (Signed)
NAME:  Deborah [name], Deborah [name]            ACCOUNT NO.:  [id number]  MEDICAL RECORD NO.:  [id number]  LOCATION:  RESP                         FACILITY:  [facility]  PHYSICIAN:  Kathee Delton, MD,FCCPDATE OF BIRTH:  05-Jun[birth year]1952  DATE OF PROCEDURE: DATE OF DISCHARGE:  12/07/2011                              OPERATIVE REPORT   PROCEDURE:  Flexible fiberoptic bronchoscopy with video.  INDICATION:  Bilateral pulmonary infiltrates on CT of chest with persistent purulent mucus despite treatment with appropriate antibiotics.  OPERATOR:  Kathee Delton, MD,FCCP  ANESTHESIA:  Versed 7.5 mg IV in two aliquots, as well as Demerol 50 mg IV.  Also, topical 1% lidocaine in the vocal cords and airways during the procedure.  DESCRIPTION:  After obtaining informed consent and under close cardiopulmonary monitoring, the above preop anesthesia was given and the fiberoptic scope was passed to the right naris and into the posterior pharynx, but there was no lesions or other abnormalities seen.  The vocal cords appeared to be within normal limits and moved bilaterally on phonation.  The scope was then passed into the trachea where it was examined along its entire length down to the level of the carina, all of which was normal.  The right tracheobronchial tree was examined serially to subsegmental level with no endobronchial abnormality being found. Bronchoalveolar lavage was then done from the anterior segment of the right upper lobe in the area of abnormality on the CT of chest.  This was sent for the appropriate bacteriologic and cytologic evaluation. The scope was then passed into the left tracheobronchial tree where nonpurulent secretions were coming from the lingula.  The upper division in the left upper lobe was normal, and there was a bronchial stump in the left lower lobe bronchus from prior lobectomy.  Attention was then paid to the lingular where there appeared to be narrowing from the lateral wall,  and appeared more secondary to scarring and distortion than true extrinsic compression.  The scope was able to be passed into the lingula by the narrowing, and the subsegments all appeared to be normal.  Bronchoalveolar lavage was done from this area with adequate return and significant mucus plugs.  These were all sent for bacteriologic and cytologic evaluation.  Finally, bronchial brushes were done at the narrowing of the lingula and sent for cytology for completeness in light of her history of carcinoid.  Overall, the patient tolerated the procedure well and there were no complications.     Kathee Delton, MD,FCCP     [facility]/MEDQ  D:  12/07/2011  T:  12/07/2011  Job:  [id number]

## 2011-12-07 NOTE — Progress Notes (Signed)
O2 saturation back up to 93% at 0906

## 2011-12-07 NOTE — Progress Notes (Addendum)
Video Bronchoscopy Done  Procedure tolerated well  Intervention Bronchial Washing done Intervention Bronchial Brushing done

## 2011-12-07 NOTE — Op Note (Signed)
Dictation # [id number]

## 2011-12-07 NOTE — Progress Notes (Signed)
Back to 2 lpm [place] at [phone]

## 2011-12-07 NOTE — H&P (View-Only) (Signed)
  Subjective:    Patient ID: Deborah [name], female    DOB: 01/02/1951, 61 y.o.   MRN: [id number]  HPI Patient comes in today for an acute sick visit.  She has not returned to her baseline since an episode of pneumonia while traveling back in mid August.  She has been treated with antibiotics with no improvement.  She has had a recent sputum culture that showed many different organisms, but did not corona any specific one.  The patient has continued to have some rattling congestion, cough with purulent mucus, and also general malaise.  She has not had any fever, but takes Tylenol on a regular basis as part of her chronic pain syndrome.  She has had a followup CT at [facility], and this showed some patchy groundglass opacities.  There were no definite infiltrates to suggest pneumonia that were new.  It is unclear how much of these are inflammatory versus infectious.   Review of Systems  Constitutional: Negative for fever and unexpected weight change.  HENT: Positive for congestion and rhinorrhea. Negative for ear pain, nosebleeds, sore throat, sneezing, trouble swallowing, dental problem, postnasal drip and sinus pressure.   Eyes: Negative for redness and itching.  Respiratory: Positive for cough and shortness of breath. Negative for chest tightness and wheezing.   Cardiovascular: Negative for palpitations and leg swelling.  Gastrointestinal: Negative for nausea and vomiting.  Genitourinary: Negative for dysuria.  Musculoskeletal: Negative for joint swelling.  Skin: Negative for rash.  Neurological: Positive for headaches.  Hematological: Does not bruise/bleed easily.  Psychiatric/Behavioral: Positive for dysphoric mood. The patient is nervous/anxious.        Objective:   Physical Exam Thin female in no acute distress Nose without purulence or discharge noted Neck without adenopathy or thyromegaly Chest with decreased breath sounds on the left, scattered crackles.  No wheezing  noted Cardiac exam with regular rate and rhythm Lower extremities without edema, no cyanosis Alert and oriented, moves all 4 extremities.       Assessment & Plan:   

## 2011-12-08 ENCOUNTER — Encounter (HOSPITAL_COMMUNITY): Payer: Self-pay

## 2011-12-08 ENCOUNTER — Encounter (HOSPITAL_COMMUNITY): Payer: Self-pay | Admitting: Pulmonary Disease

## 2011-12-09 LAB — CULTURE, BAL-QUANTITATIVE W GRAM STAIN: Colony Count: >=100000

## 2011-12-14 ENCOUNTER — Encounter: Payer: Self-pay | Admitting: Pulmonary Disease

## 2011-12-16 LAB — AFB CULTURE WITH SMEAR (NOT AT ARMC): Acid Fast Smear: NONE SEEN

## 2011-12-17 ENCOUNTER — Other Ambulatory Visit: Payer: BC Managed Care – PPO

## 2011-12-17 ENCOUNTER — Telehealth: Payer: Self-pay | Admitting: Pulmonary Disease

## 2011-12-17 DIAGNOSIS — J984 Other disorders of lung: Secondary | ICD-10-CM

## 2011-12-17 NOTE — Telephone Encounter (Signed)
Pt brought in sputum for culture labs ordered

## 2011-12-17 NOTE — Telephone Encounter (Signed)
Per phone msg taken earlier, Maudie Mercury T put in order for sputum culture.    Called Lab, spoke with [place] who spoke with [place].  Was advised they did see this order, order ok, and nothing further needed now.

## 2011-12-20 LAB — RESPIRATORY CULTURE OR RESPIRATORY AND SPUTUM CULTURE: Organism ID, Bacteria: NORMAL

## 2011-12-22 ENCOUNTER — Telehealth: Payer: Self-pay | Admitting: Pulmonary Disease

## 2011-12-22 NOTE — Telephone Encounter (Signed)
Called and spoke with patient. Patient requesting results from sputem culture that was sent.  Patient aware Dr.Clance is out of office, informed patient that message would be forwarded to Dr. Gwenette Greet and once results are available we would call her.  Verbalized understanding of this and nothing further needed at this time.

## 2011-12-28 ENCOUNTER — Other Ambulatory Visit: Payer: Self-pay | Admitting: Pulmonary Disease

## 2011-12-28 DIAGNOSIS — R918 Other nonspecific abnormal finding of lung field: Secondary | ICD-10-CM

## 2011-12-28 NOTE — Telephone Encounter (Signed)
Spoke with pt and her husband.  Will check ct chest due to her prior infiltrates and lack of clinical response to abx.

## 2011-12-31 ENCOUNTER — Encounter: Payer: Self-pay | Admitting: Pulmonary Disease

## 2011-12-31 ENCOUNTER — Inpatient Hospital Stay (HOSPITAL_COMMUNITY)
Admission: EM | Admit: 2011-12-31 | Discharge: 2012-01-05 | DRG: 089 | Disposition: A | Discharge status: Home Health | Payer: BC Managed Care – PPO | Source: Ambulatory Visit | Attending: Pulmonary Disease | Admitting: Pulmonary Disease

## 2011-12-31 ENCOUNTER — Inpatient Hospital Stay (HOSPITAL_COMMUNITY): Payer: BC Managed Care – PPO

## 2011-12-31 ENCOUNTER — Encounter (HOSPITAL_COMMUNITY): Payer: Self-pay

## 2011-12-31 ENCOUNTER — Ambulatory Visit: Payer: BC Managed Care – PPO

## 2011-12-31 ENCOUNTER — Ambulatory Visit (INDEPENDENT_AMBULATORY_CARE_PROVIDER_SITE_OTHER): Payer: BC Managed Care – PPO | Admitting: Pulmonary Disease

## 2011-12-31 VITALS — BP 96/62 | HR 102 | Ht 65.0 in | Wt 119.4 lb

## 2011-12-31 DIAGNOSIS — M949 Disorder of cartilage, unspecified: Secondary | ICD-10-CM

## 2011-12-31 DIAGNOSIS — J45909 Unspecified asthma, uncomplicated: Secondary | ICD-10-CM | POA: Diagnosis present

## 2011-12-31 DIAGNOSIS — E785 Hyperlipidemia, unspecified: Secondary | ICD-10-CM

## 2011-12-31 DIAGNOSIS — J479 Bronchiectasis, uncomplicated: Secondary | ICD-10-CM | POA: Diagnosis present

## 2011-12-31 DIAGNOSIS — I10 Essential (primary) hypertension: Secondary | ICD-10-CM | POA: Diagnosis present

## 2011-12-31 DIAGNOSIS — R918 Other nonspecific abnormal finding of lung field: Secondary | ICD-10-CM

## 2011-12-31 DIAGNOSIS — M549 Dorsalgia, unspecified: Secondary | ICD-10-CM

## 2011-12-31 DIAGNOSIS — E871 Hypo-osmolality and hyponatremia: Secondary | ICD-10-CM | POA: Diagnosis present

## 2011-12-31 DIAGNOSIS — J984 Other disorders of lung: Secondary | ICD-10-CM

## 2011-12-31 DIAGNOSIS — J189 Pneumonia, unspecified organism: Principal | ICD-10-CM | POA: Diagnosis present

## 2011-12-31 DIAGNOSIS — I7300 Raynaud's syndrome without gangrene: Secondary | ICD-10-CM

## 2011-12-31 DIAGNOSIS — M899 Disorder of bone, unspecified: Secondary | ICD-10-CM

## 2011-12-31 DIAGNOSIS — D649 Anemia, unspecified: Secondary | ICD-10-CM | POA: Diagnosis present

## 2011-12-31 DIAGNOSIS — E441 Mild protein-calorie malnutrition: Secondary | ICD-10-CM | POA: Diagnosis present

## 2011-12-31 LAB — CBC WITH DIFFERENTIAL/PLATELET
Basophils Absolute: 0 10*3/uL (ref 0.0–0.1)
Basophils Relative: 0 % (ref 0–1)
Eosinophils Absolute: 0.1 10*3/uL (ref 0.0–0.7)
Eosinophils Relative: 1 % (ref 0–5)
HCT: 26.6 % — ABNORMAL LOW (ref 36.0–46.0)
MCHC: 30.8 g/dL (ref 30.0–36.0)
MCV: 77.3 fL — ABNORMAL LOW (ref 78.0–100.0)
Monocytes Absolute: 1 10*3/uL (ref 0.1–1.0)
RDW: 18.8 % — ABNORMAL HIGH (ref 11.5–15.5)

## 2011-12-31 LAB — COMPREHENSIVE METABOLIC PANEL
ALT: 16 U/L (ref 0–35)
AST: 16 U/L (ref 0–37)
CO2: 29 mEq/L (ref 19–32)
Chloride: 92 mEq/L — ABNORMAL LOW (ref 96–112)
Creatinine, Ser: 0.76 mg/dL (ref 0.50–1.10)
GFR calc Af Amer: >90 mL/min (ref >90)
GFR calc non Af Amer: >90 mL/min (ref >90)
Glucose, Bld: 151 mg/dL — ABNORMAL HIGH (ref 70–99)
Sodium: 131 mEq/L — ABNORMAL LOW (ref 135–145)
Total Bilirubin: 0.2 mg/dL — ABNORMAL LOW (ref 0.3–1.2)

## 2011-12-31 LAB — URINALYSIS, ROUTINE W REFLEX MICROSCOPIC
Bilirubin Urine: NEGATIVE
Ketones, ur: NEGATIVE mg/dL
Nitrite: NEGATIVE
Urobilinogen, UA: 1 mg/dL (ref 0.0–1.0)

## 2011-12-31 MED ORDER — OXYCODONE-ACETAMINOPHEN 5-325 MG PO TABS
1.0000 | ORAL_TABLET | Freq: Four times a day (QID) | ORAL | Status: DC | PRN
  Administered 2011-12-31 – 2012-01-02 (×5): 1 via ORAL
  Filled 2011-12-31 (×5): qty 1

## 2011-12-31 MED ORDER — FERROUS SULFATE 325 (65 FE) MG PO TABS
325.0000 mg | ORAL_TABLET | Freq: Every day | ORAL | Status: DC
  Administered 2012-01-01 – 2012-01-05 (×5): 325 mg via ORAL
  Filled 2011-12-31 (×6): qty 1

## 2011-12-31 MED ORDER — GABAPENTIN 400 MG PO CAPS
400.0000 mg | ORAL_CAPSULE | Freq: Every day | ORAL | Status: DC
  Filled 2011-12-31: qty 1

## 2011-12-31 MED ORDER — SODIUM CHLORIDE 0.9 % IV SOLN
INTRAVENOUS | Status: DC
  Administered 2011-12-31 – 2012-01-05 (×8): via INTRAVENOUS
  Administered 2012-01-05: 10 mL/h via INTRAVENOUS

## 2011-12-31 MED ORDER — GUAIFENESIN ER 600 MG PO TB12
600.0000 mg | ORAL_TABLET | Freq: Two times a day (BID) | ORAL | Status: DC
  Administered 2011-12-31 (×2): 600 mg via ORAL
  Filled 2011-12-31 (×4): qty 1

## 2011-12-31 MED ORDER — LEVOFLOXACIN IN D5W 750 MG/150ML IV SOLN
750.0000 mg | INTRAVENOUS | Status: DC
  Administered 2011-12-31: 750 mg via INTRAVENOUS
  Filled 2011-12-31 (×2): qty 150

## 2011-12-31 MED ORDER — VANCOMYCIN HCL 1000 MG IV SOLR
750.0000 mg | Freq: Two times a day (BID) | INTRAVENOUS | Status: DC
  Administered 2012-01-01 – 2012-01-03 (×5): 750 mg via INTRAVENOUS
  Filled 2011-12-31 (×6): qty 750

## 2011-12-31 MED ORDER — ALBUTEROL SULFATE (5 MG/ML) 0.5% IN NEBU
2.5000 mg | INHALATION_SOLUTION | Freq: Four times a day (QID) | RESPIRATORY_TRACT | Status: DC
  Administered 2011-12-31 – 2012-01-05 (×16): 2.5 mg via RESPIRATORY_TRACT
  Filled 2011-12-31 (×20): qty 0.5

## 2011-12-31 MED ORDER — SODIUM CHLORIDE 0.9 % IV BOLUS (SEPSIS)
1000.0000 mL | Freq: Once | INTRAVENOUS | Status: AC
  Administered 2011-12-31: 1000 mL via INTRAVENOUS

## 2011-12-31 MED ORDER — HEPARIN SODIUM (PORCINE) 5000 UNIT/ML IJ SOLN
5000.0000 [IU] | Freq: Three times a day (TID) | INTRAMUSCULAR | Status: DC
  Administered 2011-12-31 – 2012-01-01 (×3): 5000 [IU] via SUBCUTANEOUS
  Filled 2011-12-31 (×6): qty 1

## 2011-12-31 MED ORDER — PIPERACILLIN-TAZOBACTAM 3.375 G IVPB
3.3750 g | Freq: Three times a day (TID) | INTRAVENOUS | Status: DC
  Administered 2012-01-01 – 2012-01-05 (×15): 3.375 g via INTRAVENOUS
  Filled 2011-12-31 (×16): qty 50

## 2011-12-31 MED ORDER — GABAPENTIN 400 MG PO CAPS
400.0000 mg | ORAL_CAPSULE | Freq: Four times a day (QID) | ORAL | Status: DC
  Administered 2011-12-31 – 2012-01-05 (×21): 400 mg via ORAL
  Filled 2011-12-31 (×23): qty 1

## 2011-12-31 MED ORDER — PIPERACILLIN-TAZOBACTAM 3.375 G IVPB 30 MIN
3.3750 g | Freq: Once | INTRAVENOUS | Status: AC
  Administered 2011-12-31: 3.375 g via INTRAVENOUS
  Filled 2011-12-31: qty 50

## 2011-12-31 MED ORDER — DULOXETINE HCL 60 MG PO CPEP
60.0000 mg | ORAL_CAPSULE | Freq: Every day | ORAL | Status: DC
  Administered 2011-12-31 – 2012-01-04 (×5): 60 mg via ORAL
  Filled 2011-12-31 (×7): qty 1

## 2011-12-31 MED ORDER — ALBUTEROL SULFATE (5 MG/ML) 0.5% IN NEBU: 2.5000 mg | INHALATION_SOLUTION | RESPIRATORY_TRACT | Status: DC | PRN

## 2011-12-31 MED ORDER — VANCOMYCIN HCL 1000 MG IV SOLR
750.0000 mg | Freq: Once | INTRAVENOUS | Status: AC
  Administered 2011-12-31: 750 mg via INTRAVENOUS
  Filled 2011-12-31: qty 750

## 2011-12-31 MED ORDER — OXYCODONE HCL 5 MG PO TABS
5.0000 mg | ORAL_TABLET | Freq: Four times a day (QID) | ORAL | Status: DC | PRN
  Administered 2012-01-04 – 2012-01-05 (×4): 5 mg via ORAL
  Filled 2011-12-31 (×4): qty 1

## 2011-12-31 NOTE — H&P (Signed)
Dictation #:  [id number]

## 2011-12-31 NOTE — Progress Notes (Signed)
  Subjective:    Patient ID: Deborah [name], female    DOB: 1950-12-14, 61 y.o.   MRN: [id number]  HPI Pt is admitted to hospital.  See admit note.    Review of Systems  Constitutional: Positive for unexpected weight change. Negative for fever.  HENT: Positive for congestion, rhinorrhea and sneezing. Negative for ear pain, nosebleeds, sore throat, trouble swallowing, dental problem, postnasal drip and sinus pressure.   Eyes: Negative for redness and itching.  Respiratory: Positive for cough, chest tightness, shortness of breath and wheezing.   Cardiovascular: Negative for palpitations and leg swelling.  Gastrointestinal: Negative for nausea and vomiting.  Genitourinary: Negative for dysuria.  Musculoskeletal: Negative for joint swelling.  Skin: Negative for rash.  Neurological: Negative for headaches.  Hematological: Does not bruise/bleed easily.  Psychiatric/Behavioral: Positive for dysphoric mood. The patient is nervous/anxious.        Objective:   Physical Exam        Assessment & Plan:

## 2011-12-31 NOTE — Progress Notes (Signed)
2111-Pt given flutter valve for CPT.  Pt instructed by RT and performed procedure well.  RT to instruct Pt on flutter at least TID.  RT to monitor and assess as needed.

## 2011-12-31 NOTE — Progress Notes (Addendum)
ANTIBIOTIC CONSULT NOTE - INITIAL  Pharmacy Consult for vancomycin/Zosyn Indication: pneumonia  Allergies  Allergen Reactions  . Etodolac     REACTION: rash  . Megestrol Acetate     REACTION: rash    Patient Measurements: Height: _0  (165.1 cm) Weight: 119 lb 7.8 oz (54.2 kg) IBW/kg (Calculated) : 57   Vital Signs: Temp: 97.3 F (36.3 C) (11/08 1333) Temp src: Oral (11/08 1333) BP: 178/71 mmHg (11/08 1333) Pulse Rate: 122  (11/08 1333) Intake/Output from previous day:   Intake/Output from this shift:    Labs: No results found for this basename: WBC:3,HGB:3,PLT:3,LABCREA:3,CREATININE:3 in the last 72 hours Estimated Creatinine Clearance: 64.8 ml/min (by C-G formula based on Cr of 0.7). No results found for this basename: VANCOTROUGH:2,VANCOPEAK:2,VANCORANDOM:2,GENTTROUGH:2,GENTPEAK:2,GENTRANDOM:2,TOBRATROUGH:2,TOBRAPEAK:2,TOBRARND:2,AMIKACINPEAK:2,AMIKACINTROU:2,AMIKACIN:2, in the last 72 hours   Microbiology: Recent Results (from the past 720 hour(s))  AFB CULTURE WITH SMEAR     Status: Normal (Preliminary result)   Collection Time   12/07/11  8:38 AM      Component Value Range Status Comment   Specimen Description BRONCHIAL ALVEOLAR LAVAGE   Final    Special Requests Normal   Final    ACID FAST SMEAR NO ACID FAST BACILLI SEEN   Final    Culture     Final    Value: CULTURE WILL BE EXAMINED FOR 6 WEEKS BEFORE ISSUING A FINAL REPORT   Report Status PENDING   Incomplete   CULTURE, BAL-QUANTITATIVE     Status: Normal   Collection Time   12/07/11  8:38 AM      Component Value Range Status Comment   Specimen Description BRONCHIAL ALVEOLAR LAVAGE   Final    Special Requests Normal   Final    Gram Stain     Final    Value: FEW WBC PRESENT, PREDOMINANTLY PMN     NO SQUAMOUS EPITHELIAL CELLS SEEN     NO ORGANISMS SEEN   Colony Count >=100,000 COLONIES/ML   Final    Culture Non-Pathogenic Oropharyngeal-type Flora Isolated.   Final    Report Status 12/09/2011 FINAL    Final   FUNGUS CULTURE W SMEAR     Status: Normal (Preliminary result)   Collection Time   12/07/11  8:38 AM      Component Value Range Status Comment   Specimen Description BRONCHIAL ALVEOLAR LAVAGE   Final    Special Requests Normal   Final    Fungal Smear NO YEAST OR FUNGAL ELEMENTS SEEN   Final    Culture CULTURE IN PROGRESS FOR FOUR WEEKS   Final    Report Status PENDING   Incomplete   RESPIRATORY CULTURE OR RESPIRATORY AND SPUTUM CULTURE     Status: Normal   Collection Time   12/17/11  4:31 PM      Component Value Range Status Comment   GRAM STAIN Moderate   Final    GRAM STAIN WBC present-predominately PMN   Final    GRAM STAIN Rare Squamous Epithelial Cells Present   Final    GRAM STAIN Abundant Gram Negative Rods   Final    GRAM STAIN Few Gram Positive Rods   Final    GRAM STAIN Rare GRAM POSITIVE COCCI IN PAIRS   Final    Organism ID, Bacteria Normal Oropharyngeal Flora   Final     Medical History: Past Medical History  Diagnosis Date  . Hyperlipidemia   . Osteopenia   . C. difficile colitis   . Raynaud's disease   . Restrictive lung  disease   . Hyperactive airway disease     Medications:  Scheduled:    . albuterol  2.5 mg Nebulization Q6H  . DULoxetine  60 mg Oral Daily  . ferrous sulfate  325 mg Oral Q breakfast  . gabapentin  400 mg Oral QID  . guaiFENesin  600 mg Oral BID  . heparin  5,000 Units Subcutaneous Q8H  . levofloxacin (LEVAQUIN) IV  750 mg Intravenous Q24H  . sodium chloride  1,000 mL Intravenous Once  . [DISCONTINUED] gabapentin  400 mg Oral Daily   Infusions:    . sodium chloride     Assessment:  61 yo female to be admitted from office visit by [facility] for presumed HCAP in patient with prior infiltrates and lack of clinical response to abx  Start vanc and Zosyn per pharmacy dosing  Labs have been ordered but not collected/resulted yet  Goal of Therapy:  Vancomycin trough level 15-20 mcg/ml  Plan:  1) With no labs as of  yet, will order vancomycin 724m IV x 1 dose and 2) Zosyn 3.375g IV x 1 dose 3) once labs have resulted, will dose further vanc and Zosyn as appropriate   JAdrian Saran PharmD, BCPS Pager [phone]/09/2011 1:53 PM   -------------------------------------------------------------------------------------------- Addendum:   Labs: SCr 0.76, CrCl 65   A/P: Based on current renal function, start vancomycin 7578mIV q12 and Zosyn 3.375g IV q8 (extended interval infusion)   JuAdrian SaranPharmD, BCPS Pager [phone] 36841/09/2011 2:59 PM

## 2011-12-31 NOTE — Patient Instructions (Signed)
Pt admitted to hospital

## 2012-01-01 DIAGNOSIS — J45909 Unspecified asthma, uncomplicated: Secondary | ICD-10-CM

## 2012-01-01 DIAGNOSIS — R918 Other nonspecific abnormal finding of lung field: Secondary | ICD-10-CM

## 2012-01-01 DIAGNOSIS — D649 Anemia, unspecified: Secondary | ICD-10-CM

## 2012-01-01 LAB — CBC
MCH: 23.2 pg — ABNORMAL LOW (ref 26.0–34.0)
MCHC: 29 g/dL — ABNORMAL LOW (ref 30.0–36.0)
MCV: 79.7 fL (ref 78.0–100.0)
Platelets: 396 10*3/uL (ref 150–400)
RDW: 19 % — ABNORMAL HIGH (ref 11.5–15.5)

## 2012-01-01 LAB — BASIC METABOLIC PANEL
BUN: 10 mg/dL (ref 6–23)
CO2: 29 mEq/L (ref 19–32)
Calcium: 8.8 mg/dL (ref 8.4–10.5)
Creatinine, Ser: 0.74 mg/dL (ref 0.50–1.10)
Glucose, Bld: 132 mg/dL — ABNORMAL HIGH (ref 70–99)

## 2012-01-01 MED ORDER — GUAIFENESIN ER 600 MG PO TB12
1200.0000 mg | ORAL_TABLET | Freq: Two times a day (BID) | ORAL | Status: DC
  Administered 2012-01-01 – 2012-01-05 (×8): 1200 mg via ORAL
  Filled 2012-01-01 (×9): qty 2

## 2012-01-01 MED ORDER — PANTOPRAZOLE SODIUM 40 MG PO TBEC
40.0000 mg | DELAYED_RELEASE_TABLET | Freq: Two times a day (BID) | ORAL | Status: DC
  Administered 2012-01-01 – 2012-01-05 (×10): 40 mg via ORAL
  Filled 2012-01-01 (×11): qty 1

## 2012-01-01 MED ORDER — LEVOFLOXACIN 750 MG PO TABS
750.0000 mg | ORAL_TABLET | Freq: Every day | ORAL | Status: DC
  Administered 2012-01-01 – 2012-01-05 (×5): 750 mg via ORAL
  Filled 2012-01-01 (×5): qty 1

## 2012-01-01 NOTE — Progress Notes (Signed)
Name: Deborah [name] MRN: [id number] DOB: September 09, 1950    LOS: 1     PULMONARY / CRITICAL CARE MEDICINE  HPI: 49 yowf with bronchiectasis and acute flare   Past Medical History  Diagnosis Date  . Hyperlipidemia   . Osteopenia   . C. difficile colitis   . Raynaud's disease   . Restrictive lung disease   . Hyperactive airway disease    Past Surgical History  Procedure Date  . Broncho pleural fistula   . Pulmonary carcinoid   . Cholecystectomy   . Thoracotomies   . Video bronchoscopy 12/07/2011    Procedure: VIDEO BRONCHOSCOPY WITHOUT FLUORO;  Surgeon: Kathee Delton, MD;  Location: [facility];  Service: Cardiopulmonary;  Laterality: Bilateral;   Prior to Admission medications   Medication Sig Start Date End Date Taking? Authorizing Provider  acidophilus (RISAQUAD) CAPS Take 1 capsule by mouth daily.     Yes Historical Provider, MD  albuterol (PROVENTIL HFA;VENTOLIN HFA) 108 (90 BASE) MCG/ACT inhaler Inhale 2 puffs into the lungs every 6 (six) hours as needed. For shortness of breath.   Yes Historical Provider, MD  calcium-vitamin D (OSCAL WITH D) 500-200 MG-UNIT per tablet Take 1 tablet by mouth daily.   Yes Historical Provider, MD  dextromethorphan-guaiFENesin (MUCINEX DM) 30-600 MG per 12 hr tablet Take 1 tablet by mouth 2 (two) times daily as needed. For cough/congestion.   Yes Historical Provider, MD  DULoxetine (CYMBALTA) 60 MG capsule Take 60 mg by mouth daily.     Yes Historical Provider, MD  ferrous sulfate 325 (65 FE) MG tablet Take 1 tablet (325 mg total) by mouth daily with breakfast. 03/16/11 03/15/12 Yes Bruce Kendall Flack, MD  gabapentin (NEURONTIN) 400 MG capsule Take 400 mg by mouth 4 (four) times daily.   Yes Historical Provider, MD  lidocaine (LIDODERM) 5 % Place 0.5 patches onto the skin daily. Remove & Discard patch within 12 hours or as directed by MD   Yes Historical Provider, MD  lisinopril (PRINIVIL,ZESTRIL) 2.5 MG tablet Take 2.5 mg by mouth daily.   Yes  Historical Provider, MD  mometasone-formoterol (DULERA) 100-5 MCG/ACT AERO Inhale 2 puffs into the lungs daily.   Yes Historical Provider, MD  Multiple Vitamin (MULTIVITAMIN WITH MINERALS) TABS Take 1 tablet by mouth daily.   Yes Historical Provider, MD  oxyCODONE (OXY IR/ROXICODONE) 5 MG immediate release tablet Take 5 mg by mouth every 6 (six) hours as needed. For pain.   Yes Historical Provider, MD   Allergies Allergies  Allergen Reactions  . Etodolac     REACTION: rash  . Megestrol Acetate     REACTION: rash    Family History History reviewed. No pertinent family history. Social History  reports that she has never smoked. She has never used smokeless tobacco. Her alcohol and drug histories not on file.     Current Status: C/o mostly dry cough and "congestion" but not bringing any mucus up at all   Vital Signs: Temp:  [97.3 F (36.3 C)-98.4 F (36.9 C)] 98.4 F (36.9 C) (11/09 0559) Pulse Rate:  [93-122] 93  (11/08 2200) Resp:  [20-24] 20  (11/09 0559) BP: (96-178)/(47-71) 106/47 mmHg (11/09 0559) SpO2:  [74 %-100 %] 100 % (11/09 0559) Weight:  [119 lb 6.4 oz (54.159 kg)-119 lb 7.8 oz (54.2 kg)] 119 lb 7.8 oz (54.2 kg) (11/08 1333) FIO2 3lpm  Physical Examination: General: pleasant chronically ill appearing wf nad HEENT mild turbinate edema.  Oropharynx no thrush or excess pnd or  cobblestoning.  No JVD or cervical adenopathy. Mild accessory muscle hypertrophy. Trachea midline, nl thryroid. Chest was hyperinflated by percussion with diminished breath sounds and moderate increased exp time without wheeze. Hoover sign positive at mid inspiration. Regular rate and rhythm without murmur gallop or rub or increase P2 or edema.  Abd: no hsm, nl excursion. Ext warm without cyanosis or clubbing.     Active Problems:  HYPERTENSION  REACTIVE AIRWAY DISEASE  Restrictive lung disease  Pulmonary infiltrates   ASSESSMENT AND PLAN  PULMONARY No results found for this basename:  PHART:5,PCO2:5,PCO2ART:5,PO2ART:5,HCO3:5,O2SAT:5 in the last 168 hours    CT 12/31/11:    1. The appearance of the left upper lobe is suggest acute  infection superimposed upon chronic scarring. There are some  patchy interstitial and ground-glass opacities throughout the right  upper lobe as well, which may reflect some early endobronchial  spread of infection.  2. Status post left lower lobectomy with chronic left pleural gas  and fluid collection which is partially calcified. This is similar  to remote prior study 11/19/2002 making infection (i.e., acute  empyema) highly unlikely.  3. No definitive findings to suggest interstitial lung disease.  However, the scarring in the left hemithorax may simulate a  symptoms of restrictive lung disease.  4. Dilatation of the pulmonic trunk suggesting pulmonary arterial  hypertension.  5. Lymphadenopathy in the left axilla and mediastinum,  A:  Acute flare of bronchiectasis vs HCAP P:  Flutter/nebs/ abx per ID section    RENAL  Lab 01/01/12 0554 12/31/11 1406  NA 136 131*  K 4.4 5.1  CL 100 92*  CO2 29 29  BUN 10 20  CREATININE 0.74 0.76  CALCIUM 8.8 9.1  MG -- 2.2  PHOS -- 3.3   Intake/Output      11/08 0701 - 11/09 0700 11/09 0701 - 11/10 0700   P.O. 0 360   I.V. (mL/kg)  1650 (30.4)   IV Piggyback  12.5   Total Intake(mL/kg)  2022.5 (37.3)   Urine (mL/kg/hr)  600   Total Output  600   Net 0 +1422.5           A:  Mild hyponatremia/ resolved    GASTROINTESTINAL  Lab 12/31/11 1406  AST 16  ALT 16  ALKPHOS 212*  BILITOT 0.2*  PROT 7.7  ALBUMIN 2.5*    A: Protein calorie malnutrition  HEMATOLOGIC  Lab 01/01/12 0554 12/31/11 1406  HGB 7.2* 8.2*  HCT 24.8* 26.6*  PLT 396 533*  INR -- --  APTT -- --   A:  Anemia P: continue Fe, no hep    INFECTIOUS  Lab 01/01/12 0554 12/31/11 1406  WBC 7.2 11.6*  PROCALCITON -- --   Cultures: Sputum 11/8 >>>   Antibiotics: Levaquin 11/8 >> Zosyn 11/8  >> Vanc 11/8 >>   A: ? HCAP P:  Continue broad abx pending cultures      BEST PRACTICE / DISPOSITION Level of Care:  floor Primary Service:  PCCM Consultants:  Nonee Code Status:  full Diet:  regular DVT Px:  pas GI Px:  ppi Skin Integrity:  ok Social / Family:  No fm at bedside  Christinia Gully, M.D. Pulmonary [facility] [place] Pager: [phone]  01/01/2012, 10:16 AM

## 2012-01-01 NOTE — H&P (Signed)
NAMEGRAYLEE, Deborah [name]            ACCOUNT NO.:  [id number]  MEDICAL RECORD NO.:  [id number]  LOCATION:  5427                         FACILITY:  [facility]  PHYSICIAN:  Kathee Delton, MD,FCCPDATE OF BIRTH:  04/18/52  DATE OF ADMISSION:  12/31/2011 DATE OF DISCHARGE:                             HISTORY & PHYSICAL   HISTORY OF PRESENT ILLNESS:  The patient is a very pleasant 61 year old female with a long history of pulmonary issues.  She has a history of neuroendocrine tumor with resection many years ago at [facility], complicated by bronchopleural fistula and chest wall resection. This has left her with significant restrictive lung disease, as well as some underlying bronchiectasis.  The patient also has some mild reactive airways disease by pulmonary function studies.  She was in her usual state of health until a trip to [place] back in mid August, where she developed pneumonia.  She was treated with antibiotics and had some improvement but never returned to her normal baseline.  She was treated with a second round of broad-spectrum antibiotics, again without significant change.  She was seen in the office the first week of October where she was continuing to have chest congestion, cough, as well as purulent mucus.  She was also having general malaise and low- grade fever at times.  She had a followup CT at [facility] that showed patchy ground-glass opacities in both lungs.  At that time, I was concerned that she had ongoing pneumonia that was not responding to typical antibiotic therapy, and the patient underwent fiberoptic bronchoscopy with BAL.  She grew numerous organisms on her gram stain, but the culture showed only normal oropharyngeal flora.  The patient continued to have purulent mucus, and I have her deliver her sputum specimen to the office which again showed numerous organisms but did not grow any specific pathogen.  The plan was to do a followup CT chest  to see if things had improved or stayed the same, however, she presented to the office today with worsening cough and congestion, increased shortness of breath, low-grade fever, as well as poor p.o. intake and weight loss.  Her husband was concerned about her being dehydrated, and her blood pressure was lower than usual with some baseline tachycardia. Because of how ill she appears, she is being admitted for broad-spectrum IV antibiotics and re-evaluation by CT chest.  PAST MEDICAL HISTORY: 1. Significant for dyslipidemia. 2. History of osteopenia. 3. History of C. difficile colitis. 4. History of Raynaud disease. 5. History of restrictive lung disease after chest wall resection at     [facility]. 6. History of carcinoid and status post resection in 0623, complicated     by bronchopleural fistula.  SOCIAL HISTORY:  She is married and independent.  She has never smoked, nor does she use alcohol on a consistent basis.  FAMILY HISTORY:  Totally noncontributory.  ALLERGIES:  The patient is allergic to ETODOLAC, and MEGESTROL ACETATE.  REVIEW OF SYSTEMS:  CONSTITUTIONAL:  Positive for unexpected weight change.  Negative for fever.  HEENT: Positive for congestion, rhinorrhea, and sneezing.  Negative for ear pain, nosebleed, sore throat, trouble swallowing, dental problems, postnasal drip and sinus pressure.  Eyes: Negative for redness and itching.  Respiratory: Positive for cough, chest tightness, shortness of breath and wheezing. Cardiovascular:  Negative for palpitations, leg swelling. GASTROINTESTINAL:  Negative for nausea and vomiting.  GENITOURINARY: Negative for dysuria.  MUSCULOSKELETAL:  Negative for joint swelling. SKIN:  Negative for rash.  NEUROLOGIC:  Negative for headaches. HEMATOLOGICAL:  Denies easy bruisability.  PSYCHIATRIC AND BEHAVIORAL: Positive for dysphoric mood.  The patient is nervous and anxious.  CURRENT MEDICATIONS:  Acidophilus capsule 1 daily,  Cymbalta 60 mg 1 daily, ferrous sulfate 325 one daily with breakfast, albuterol inhaler p.r.n., Foradil 1 b.i.d., Neurontin 400 mg 2 tablets once a day, Lidoderm patch p.r.n., lisinopril 2.5 mg 1 daily, Dulera 2 puffs b.i.d., oxycodone/ acetaminophen 325 one tablet every 6 hours as needed.  PHYSICAL EXAMINATION:  GENERAL:  She is an ill-appearing female with no acute respiratory distress. VITAL SIGNS:  Blood pressure was 96/62, pulse 102, she was afebrile, respiratory rate was 18, sat of 92%.  She was 5 feet 5 inches tall and 120 pounds. HEENT:  Pupils equal, round, and reactive to light and accommodation. Extraocular muscles are intact.  Nares are patent without discharge. Oropharynx is clear. NECK:  Supple without JVD or lymphadenopathies.  No palpable thyromegaly. CHEST:  Decreased breath sounds involving the left chest with scattered crackles bilaterally but no wheezing. HEART:  Mildly tachycardic, regular rhythm with no murmurs, rubs or gallops. ABDOMEN:  Soft, nontender, nondistended with good bowel sounds. GENITAL:  Not done and not indicated. RECTAL:  Not done and not indicated. BREASTS:  Not done and not indicated. LOWER EXTREMITIES:  Without edema, pulses were intact distally, there is cyanosis noted or calf tenderness. NEUROLOGIC:  She is alert and oriented, moves all 4 extremities without obvious deficits.  IMPRESSION: 1. Probable community-acquired pneumonia with a concern for resistant     organisms in light of her underlying bronchiectasis and underlying     lung disease.  The patient will need to be admitted for IV fluids     as well as broad-spectrum IV antibiotics until further culture data     can be obtained.  She will also need a CT scan of her chest for     further characterization of her recent infiltrates.  She will also     benefit from aggressive bronchodilators and pulmonary toilet. 2. History of restrictive lung disease secondary to chest wall      resection probably carcinoid with a bronchopleural fistula.     Kathee Delton, MD,FCCP     [facility]/MED[facility]  D:  12/31/2011  T:  01/01/2012  Job:  [id number]

## 2012-01-02 DIAGNOSIS — J984 Other disorders of lung: Secondary | ICD-10-CM

## 2012-01-02 LAB — LEGIONELLA ANTIGEN, URINE: Legionella Antigen, Urine: NEGATIVE

## 2012-01-02 MED ORDER — RISAQUAD PO CAPS
1.0000 | ORAL_CAPSULE | Freq: Every day | ORAL | Status: DC
  Administered 2012-01-02 – 2012-01-05 (×4): 1 via ORAL
  Filled 2012-01-02 (×4): qty 1

## 2012-01-02 MED ORDER — OXYCODONE-ACETAMINOPHEN 5-325 MG PO TABS
1.0000 | ORAL_TABLET | Freq: Four times a day (QID) | ORAL | Status: DC | PRN
  Administered 2012-01-02 – 2012-01-05 (×12): 1 via ORAL
  Filled 2012-01-02 (×12): qty 1

## 2012-01-02 MED ORDER — LIDOCAINE 5 % EX PTCH
1.0000 | MEDICATED_PATCH | CUTANEOUS | Status: DC
  Administered 2012-01-02 – 2012-01-05 (×4): 1 via TRANSDERMAL
  Filled 2012-01-02 (×4): qty 1

## 2012-01-02 MED ORDER — BOOST / RESOURCE BREEZE PO LIQD
1.0000 | Freq: Two times a day (BID) | ORAL | Status: DC
  Administered 2012-01-03 – 2012-01-04 (×4): 1 via ORAL

## 2012-01-02 MED ORDER — VITAMINS A & D EX OINT
TOPICAL_OINTMENT | CUTANEOUS | Status: AC
  Filled 2012-01-02: qty 5

## 2012-01-02 MED ORDER — ENSURE COMPLETE PO LIQD
237.0000 mL | Freq: Two times a day (BID) | ORAL | Status: DC
  Administered 2012-01-05: 237 mL via ORAL

## 2012-01-02 NOTE — Progress Notes (Signed)
INITIAL ADULT NUTRITION ASSESSMENT Date: 01/02/2012   Time: 12:59 PM Reason for Assessment: Nutrition Risk  ASSESSMENT: Female 61 y.o.  Dx: Pulmonary infiltrates  Hx:  Past Medical History  Diagnosis Date  . Hyperlipidemia   . Osteopenia   . C. difficile colitis   . Raynaud's disease   . Restrictive lung disease   . Hyperactive airway disease    Past Surgical History  Procedure Date  . Broncho pleural fistula   . Pulmonary carcinoid   . Cholecystectomy   . Thoracotomies   . Video bronchoscopy 12/07/2011    Procedure: VIDEO BRONCHOSCOPY WITHOUT FLUORO;  Surgeon: Kathee Delton, MD;  Location: [facility];  Service: Cardiopulmonary;  Laterality: Bilateral;    Related Meds:     . acidophilus  1 capsule Oral Daily  . albuterol  2.5 mg Nebulization Q6H  . DULoxetine  60 mg Oral Daily  . ferrous sulfate  325 mg Oral Q breakfast  . gabapentin  400 mg Oral QID  . guaiFENesin  1,200 mg Oral BID  . levofloxacin  750 mg Oral Daily  . lidocaine  1 patch Transdermal Q24H  . pantoprazole  40 mg Oral BID AC  . piperacillin-tazobactam (ZOSYN)  IV  3.375 g Intravenous Q8H  . vancomycin  750 mg Intravenous Q12H  . vitamin A & D         Ht: _0  (165.1 cm)  Wt: 119 lb 7.8 oz (54.2 kg)  Ideal Wt: 57 kg  % Ideal Wt: 95  Usual Wt:  Wt Readings from Last 10 Encounters:  12/31/11 119 lb 7.8 oz (54.2 kg)  12/31/11 119 lb 6.4 oz (54.159 kg)  12/01/11 125 lb 9.6 oz (56.972 kg)  09/28/11 128 lb 3.2 oz (58.151 kg)  06/29/11 131 lb (59.421 kg)  03/10/11 130 lb (58.968 kg)  12/02/10 131 lb (59.421 kg)  03/04/10 127 lb (57.607 kg)  12/03/09 127 lb 4 oz (57.72 kg)  08/29/09 139 lb (63.05 kg)    % Usual Wt: 93% of weight 3 months ago  Body mass index is 19.88 kg/(m^2).  Labs:  CMP     Component Value Date/Time   NA 136 01/01/2012 0554   K 4.4 01/01/2012 0554   CL 100 01/01/2012 0554   CO2 29 01/01/2012 0554   GLUCOSE 132* 01/01/2012 0554   BUN 10 01/01/2012 0554   CREATININE 0.74 01/01/2012 0554   CALCIUM 8.8 01/01/2012 0554   PROT 7.7 12/31/2011 1406   ALBUMIN 2.5* 12/31/2011 1406   AST 16 12/31/2011 1406   ALT 16 12/31/2011 1406   ALKPHOS 212* 12/31/2011 1406   BILITOT 0.2* 12/31/2011 1406   GFRNONAA >90 01/01/2012 0554   GFRAA >90 01/01/2012 0554    I/O last 3 completed shifts: In: 2160 [P.O.:360; I.V.:1750; IV Piggyback:50] Out: 800 [Urine:800]     Diet Order: General  Supplements/Tube Feeding:  none  IVF:    sodium chloride Last Rate: 100 mL/hr at 01/02/12 1055    Estimated Nutritional Needs:   Kcal: 1600-1800 Protein: 75-85 gm Fluid: 1.6-1.8l  Food/Nutrition Related Hx: Pt reports being ill for 3 months but worse over the last 1-2 weeks and was unable to eat during that time.  Intake is now improving.  Ate a [employer] today.    Pt meets criteria for mild-moderate malnutrition related to chronic illness AEB intake <75% for the past month and a mild decrease in muscle mass and body fat.  NUTRITION DIAGNOSIS: -Inadequate oral intake (NI-2.1).  Status:  Ongoing  RELATED TO: decreased intake  AS EVIDENCE BY: intake of 50-75% meals  MONITORING/EVALUATION(Goals): Intake, labs, weight Goal:  Intake of >75% meals and supplements  EDUCATION NEEDS: -Education needs addressed-educated on needs to increase calories and protein.  INTERVENTION: Continue regular diet Add Ensure Complete bid Add Resource Breeze bid Encouraged po  Dietitian #:[id number]  DOCUMENTATION CODES Per approved criteria  -Mild-Moderage malnutrition in the context of chronic illness    Darrol Jump 01/02/2012, 12:59 PM

## 2012-01-02 NOTE — Progress Notes (Signed)
Name: Deborah [name] MRN: [id number] DOB: 1950-03-14    LOS: 2     PULMONARY / CRITICAL CARE MEDICINE  Brief patient profile:   68 yowf with bronchiectasis and refractory cough with purulent sputum and worsening sob  Subj/overnight   Severe hacking dry upper airway patter cough s/p d/c ACEI on admit  Vital Signs: Temp:  [97.9 F (36.6 C)-98 F (36.7 C)] 97.9 F (36.6 C) (11/10 0643) Pulse Rate:  [80-95] 80  (11/10 0643) Resp:  [18] 18  (11/10 0643) BP: (96-109)/(45-61) 98/47 mmHg (11/10 0643) SpO2:  [97 %-100 %] 98 % (11/10 0643) FIO2 2lpm  Physical Examination: General: pleasant chronically ill appearing wf nad sitting in chair HEENT mild turbinate edema.  Oropharynx no thrush or excess pnd or cobblestoning.  No JVD or cervical adenopathy. Mild accessory muscle hypertrophy. Trachea midline, nl thryroid. Chest was hyperinflated by percussion with diminished breath sounds and moderate increased exp time without wheeze. Hoover sign positive at mid inspiration. Regular rate and rhythm without murmur gallop or rub or increase P2 or edema.  Abd: no hsm, nl excursion. Ext warm without cyanosis or clubbing.     Active Problems:  HYPERTENSION  REACTIVE AIRWAY DISEASE  Restrictive lung disease  Pulmonary infiltrates   ASSESSMENT AND PLAN  PULMONARY No results found for this basename: PHART:5,PCO2:5,PCO2ART:5,PO2ART:5,HCO3:5,O2SAT:5 in the last 168 hours    CT 12/31/11:    1. The appearance of the left upper lobe is suggest acute  infection superimposed upon chronic scarring. There are some  patchy interstitial and ground-glass opacities throughout the right  upper lobe as well, which may reflect some early endobronchial  spread of infection.  2. Status post left lower lobectomy with chronic left pleural gas  and fluid collection which is partially calcified. This is similar  to remote prior study 11/19/2002 making infection (i.e., acute  empyema) highly unlikely.  3. No  definitive findings to suggest interstitial lung disease.  However, the scarring in the left hemithorax may simulate a  symptoms of restrictive lung disease.  4. Dilatation of the pulmonic trunk suggesting pulmonary arterial  hypertension.  5. Lymphadenopathy in the left axilla and mediastinum,  A:  Acute flare of bronchiectasis vs HCAP P:  Flutter/nebs/ abx per ID section    RENAL  Lab 01/01/12 0554 12/31/11 1406  NA 136 131*  K 4.4 5.1  CL 100 92*  CO2 29 29  BUN 10 20  CREATININE 0.74 0.76  CALCIUM 8.8 9.1  MG -- 2.2  PHOS -- 3.3   Intake/Output      11/09 0701 - 11/10 0700 11/10 0701 - 11/11 0700   P.O. 360    I.V. (mL/kg) 1750 (32.3)    IV Piggyback 50    Total Intake(mL/kg) 2160 (39.9)    Urine (mL/kg/hr) 800 (0.6)    Total Output 800    Net +1360         Urine Occurrence 1 x       A:  Mild hyponatremia/ resolved    GASTROINTESTINAL  Lab 12/31/11 1406  AST 16  ALT 16  ALKPHOS 212*  BILITOT 0.2*  PROT 7.7  ALBUMIN 2.5*    A: Protein calorie malnutrition, mild P regular diet  HEMATOLOGIC  Lab 01/01/12 0554 12/31/11 1406  HGB 7.2* 8.2*  HCT 24.8* 26.6*  PLT 396 533*  INR -- --  APTT -- --   A:  Anemia P: continue Fe, sq hep d/c 11/9    INFECTIOUS  Lab 01/01/12  0554 12/31/11 1406  WBC 7.2 11.6*  PROCALCITON -- --   Cultures: Sputum 11/8 > nl flora   Antibiotics: Levaquin 11/8 >> Zosyn 11/8 >> Vanc 11/8 >>   A: ? HCAP P:  Continue broad abx pending cultures  Symptoms are markedly disproportionate to objective findings and not clear this is all a  lung problem but pt does appear to have difficult airway management issues.  DDX of  difficult airways managment all start with A and  include Adherence, Ace Inhibitors, Acid Reflux, Active Sinus Disease, Alpha 1 Antitripsin deficiency, Anxiety masquerading as Airways dz,  ABPA,  allergy(esp in young), Aspiration (esp in elderly), Adverse effects of DPI,  Active smokers, plus two Bs  =  Bronchiectasis and Beta blocker use..and one C= CHF  For now leave off acei and rx gerd aggressively      BEST PRACTICE / DISPOSITION Level of Care:  floor Primary Service:  PCCM Consultants:  Nonee Code Status:  full Diet:  regular DVT Px:  pas GI Px:  ppi Skin Integrity:  ok Social / Family: discussed with husband at bedside  Christinia Gully, M.D. Pulmonary and [place] Pager: [phone]  01/02/2012, 12:23 PM

## 2012-01-03 LAB — CBC
HCT: 22.7 % — ABNORMAL LOW (ref 36.0–46.0)
MCV: 79.6 fL (ref 78.0–100.0)
Platelets: 352 10*3/uL (ref 150–400)
RBC: 2.85 MIL/uL — ABNORMAL LOW (ref 3.87–5.11)
WBC: 5.8 10*3/uL (ref 4.0–10.5)

## 2012-01-03 LAB — APTT: aPTT: 38 seconds — ABNORMAL HIGH (ref 24–37)

## 2012-01-03 LAB — ABO/RH: ABO/RH(D): A POS

## 2012-01-03 LAB — CULTURE, RESPIRATORY W GRAM STAIN

## 2012-01-03 MED ORDER — VANCOMYCIN HCL IN DEXTROSE 1-5 GM/200ML-% IV SOLN
1000.0000 mg | Freq: Two times a day (BID) | INTRAVENOUS | Status: DC
  Administered 2012-01-03 – 2012-01-04 (×2): 1000 mg via INTRAVENOUS
  Filled 2012-01-03 (×2): qty 200

## 2012-01-03 NOTE — Progress Notes (Signed)
[place] Progress Note Patient Name: Deborah [name] DOB: Jul 06, 1950 MRN: [id number]  Date of Service  01/03/2012   HPI/Events of Note   Hg 6.6   eICU Interventions   Transfuse one unit of blood      Herny Scurlock 01/03/2012, 6:18 AM

## 2012-01-03 NOTE — Progress Notes (Signed)
Subjective: Feels much better/stronger.  Decreased cough and quantity of mucus.  Less congested.  Anemic over weekend with am labs pending.   Objective: Vital signs in last 24 hours: Blood pressure 96/57, pulse 82, temperature 97.9 F (36.6 C), temperature source Oral, resp. rate 24, height 5' 5" (1.651 m), weight 119 lb 7.8 oz (54.2 kg), SpO2 97.00%.  Intake/Output from previous day:     Physical Exam:   frail appearing female in nad Nose without purulence or discharge OP clear Neck without LN, TMG Chest with decreased bs and crackles on left, right clear. Cor with minimal regular tachy abd benign LE without edema, no cyanosis Alert and oriented, moves all 4.    Lab Results:  Basename 01/03/12 0502 01/01/12 0554 12/31/11 1406  WBC 5.8 7.2 11.6*  HGB 6.6* 7.2* 8.2*  HCT 22.7* 24.8* 26.6*  PLT 352 396 533*   BMET  Basename 01/01/12 0554 12/31/11 1406  NA 136 131*  K 4.4 5.1  CL 100 92*  CO2 29 29  GLUCOSE 132* 151*  BUN 10 20  CREATININE 0.74 0.76  CALCIUM 8.8 9.1    Studies/Results: No results found.  Assessment/Plan: Patient Active Hospital Problem List:  Community acquired pna:  The pt's ct chest appears worse from her ct in [place] at [facility].  Unfortunately, we have not been able to identify a pathogen despite multiple cultures and bronchoscopy.  She did have narrowing/scarring of lingular bronchus.  ? Drainage issue.  Will need to continue IV abx in light of failure to respond to oral abx as an outpt.  Will probably need home IV abx for 7-10 days at discharge.  Anemia:  She has a h/o of this, and is on iron supplements.  I suspect there is also a component of anemia of chronic disease.  Will check stool, although no history to support GIB.  Will recheck iron panel.  Hold off on transfusion for now since pt is stable.    Kathee Delton, M.D. 01/03/2012, 9:21 AM

## 2012-01-03 NOTE — Progress Notes (Signed)
ANTIBIOTIC CONSULT NOTE - FOLLOW UP  Pharmacy Consult for Vancomycin Indication:Pneumonia in setting of bronchiectasis   Allergies  Allergen Reactions  . Etodolac     REACTION: rash  . Megestrol Acetate     REACTION: rash    Patient Measurements: Height: _0  (165.1 cm) Weight: 119 lb 7.8 oz (54.2 kg) IBW/kg (Calculated) : 57  Adjusted Body Weight:   Vital Signs: Temp: 97.9 F (36.6 C) (11/11 0600) Temp src: Oral (11/11 0600) BP: 96/57 mmHg (11/11 0600) Pulse Rate: 82  (11/11 0600) Intake/Output from previous day:   Intake/Output from this shift:    Labs:  Basename 01/03/12 0502 01/01/12 0554  WBC 5.8 7.2  HGB 6.6* 7.2*  PLT 352 396  LABCREA -- --  CREATININE -- 0.74   Estimated Creatinine Clearance: 64.8 ml/min (by C-G formula based on Cr of 0.74).  Basename 01/03/12 1500  VANCOTROUGH 10.8  VANCOPEAK --  VANCORANDOM --  GENTTROUGH --  [place] --  AMIKACIN --     Microbiology: Recent Results (from the past 720 hour(s))  AFB CULTURE WITH SMEAR     Status: Normal (Preliminary result)   Collection Time   12/07/11  8:38 AM      Component Value Range Status Comment   Specimen Description BRONCHIAL ALVEOLAR LAVAGE   Final    Special Requests Normal   Final    ACID FAST SMEAR NO ACID FAST BACILLI SEEN   Final    Culture     Final    Value: CULTURE WILL BE EXAMINED FOR 6 WEEKS BEFORE ISSUING A FINAL REPORT   Report Status PENDING   Incomplete   CULTURE, BAL-QUANTITATIVE     Status: Normal   Collection Time   12/07/11  8:38 AM      Component Value Range Status Comment   Specimen Description BRONCHIAL ALVEOLAR LAVAGE   Final    Special Requests Normal   Final    Gram Stain     Final    Value: FEW WBC PRESENT, PREDOMINANTLY PMN     NO SQUAMOUS EPITHELIAL CELLS SEEN     NO ORGANISMS SEEN   Colony Count >=100,000 COLONIES/ML   Final    Culture Non-Pathogenic  Oropharyngeal-type Flora Isolated.   Final    Report Status 12/09/2011 FINAL   Final   FUNGUS CULTURE W SMEAR     Status: Normal (Preliminary result)   Collection Time   12/07/11  8:38 AM      Component Value Range Status Comment   Specimen Description BRONCHIAL ALVEOLAR LAVAGE   Final    Special Requests Normal   Final    Fungal Smear NO YEAST OR FUNGAL ELEMENTS SEEN   Final    Culture CULTURE IN PROGRESS FOR FOUR WEEKS   Final    Report Status PENDING   Incomplete   RESPIRATORY CULTURE OR RESPIRATORY AND SPUTUM CULTURE     Status: Normal   Collection Time   12/17/11  4:31 PM      Component Value Range Status Comment   GRAM STAIN Moderate   Final    GRAM STAIN WBC present-predominately PMN   Final    GRAM STAIN Rare Squamous Epithelial Cells Present   Final    GRAM STAIN Abundant Gram Negative Rods   Final    GRAM STAIN Few Gram Positive Rods   Final    GRAM STAIN Rare  GRAM POSITIVE COCCI IN PAIRS   Final    Organism ID, Bacteria Normal Oropharyngeal Flora   Final   CULTURE, EXPECTORATED SPUTUM-ASSESSMENT     Status: Normal   Collection Time   12/31/11 11:22 PM      Component Value Range Status Comment   Specimen Description SPUTUM   Final    Special Requests NONE   Final    Sputum evaluation     Final    Value: THIS SPECIMEN IS ACCEPTABLE. RESPIRATORY CULTURE REPORT TO FOLLOW.   Report Status 01/01/2012 FINAL   Final   CULTURE, RESPIRATORY     Status: Normal   Collection Time   12/31/11 11:22 PM      Component Value Range Status Comment   Specimen Description SPUTUM   Final    Special Requests NONE   Final    Gram Stain     Final    Value: ABUNDANT WBC PRESENT,BOTH PMN AND MONONUCLEAR     FEW SQUAMOUS EPITHELIAL CELLS PRESENT     MODERATE GRAM NEGATIVE RODS     MODERATE GRAM VARIABLE ROD     RARE GRAM POSITIVE RODS   Culture NORMAL OROPHARYNGEAL FLORA   Final    Report Status 01/03/2012 FINAL   Final     Anti-infectives     Start     Dose/Rate Route Frequency Ordered  Stop   01/01/12 1045   levofloxacin (LEVAQUIN) tablet 750 mg        750 mg Oral Daily 01/01/12 1031     01/01/12 0400   vancomycin (VANCOCIN) 750 mg in sodium chloride 0.9 % 150 mL IVPB        750 mg 150 mL/hr over 60 Minutes Intravenous Every 12 hours 12/31/11 1502     12/31/11 2359  piperacillin-tazobactam (ZOSYN) IVPB 3.375 g       3.375 g 12.5 mL/hr over 240 Minutes Intravenous Every 8 hours 12/31/11 1502     12/31/11 1600   levofloxacin (LEVAQUIN) IVPB 750 mg  Status:  Discontinued        750 mg 100 mL/hr over 90 Minutes Intravenous Every 24 hours 12/31/11 1337 01/01/12 1031   12/31/11 1430   vancomycin (VANCOCIN) 750 mg in sodium chloride 0.9 % 150 mL IVPB        750 mg 150 mL/hr over 60 Minutes Intravenous  Once 12/31/11 1355 12/31/11 1530   12/31/11 1430  piperacillin-tazobactam (ZOSYN) IVPB 3.375 g       3.375 g 100 mL/hr over 30 Minutes Intravenous  Once 12/31/11 1355 12/31/11 1641          Assessment: Assessment:  61 y/o F admitted with pneumonia that was community-acquired but occurred in the setting of bronchiectasis and did not resolve after prior outpatient antibiotics. Now on D#4 of Vancomycin 750 mg IV q12h / Zosyn 3.375 grams IV q8h (extended-infusion) / Levofloxacin 750 mg PO q24h. Mention of outpatient IV antibiotics noted. Vanc trough=10.8 on Vanc 717m IV q 12 hours Goal of Therapy:  Vancomycin trough level 15-20 mcg/ml   Eradication of infection Plan:   Increase Vancomycin to 10036mIV q 12 hours to elevate trough to desired trough level 15-20 mcg/ml  ReGypsy Decant1/12/2011,4:26 PM

## 2012-01-03 NOTE — Progress Notes (Signed)
CRITICAL VALUE ALERT  Critical value received: Hgb 6.6  Date of notification:  [id number]  Time of notification:  0545  Critical value read back:y  Nurse who received alert: Nat Math  MD notified (1st page):  Carolin Guernsey  Time of first page:  0600  MD notified (2nd page):0610  Time of second JPET:[id number]  Responding MD:  Carolin Guernsey  Time MD responded: [phone]

## 2012-01-03 NOTE — Progress Notes (Signed)
[place] for Vancomycin/Zosyn Indication: Pneumonia in setting of bronchiectasis  Allergies  Allergen Reactions  . Etodolac     REACTION: rash  . Megestrol Acetate     REACTION: rash    Patient Measurements: Height: _0  (165.1 cm) Weight: 119 lb 7.8 oz (54.2 kg) IBW/kg (Calculated) : 57   Vital Signs: Temp: 97.9 F (36.6 C) (11/11 0600) Temp src: Oral (11/11 0600) BP: 96/57 mmHg (11/11 0600) Pulse Rate: 82  (11/11 0600)    Labs:  Basename 01/03/12 0502 01/01/12 0554 12/31/11 1406  WBC 5.8 7.2 11.6*  HGB 6.6* 7.2* 8.2*  PLT 352 396 533*  LABCREA -- -- --  CREATININE -- 0.74 0.76   Estimated Creatinine Clearance: 64.8 ml/min (by C-G formula based on Cr of 0.74). No results found for this basename: VANCOTROUGH:2,VANCOPEAK:2,VANCORANDOM:2,GENTTROUGH:2,GENTPEAK:2,GENTRANDOM:2,TOBRATROUGH:2,TOBRAPEAK:2,TOBRARND:2,AMIKACINPEAK:2,AMIKACINTROU:2,AMIKACIN:2, in the last 72 hours   Microbiology: Sputum culture 11/8:  Normal oropharyngeal flora   Medical History: Past Medical History  Diagnosis Date  . Hyperlipidemia   . Osteopenia   . C. difficile colitis   . Raynaud's disease   . Restrictive lung disease   . Hyperactive airway disease     Medications:  Scheduled:     . acidophilus  1 capsule Oral Daily  . albuterol  2.5 mg Nebulization Q6H  . DULoxetine  60 mg Oral Daily  . feeding supplement  237 mL Oral BID BM  . feeding supplement  1 Container Oral BID BM  . ferrous sulfate  325 mg Oral Q breakfast  . gabapentin  400 mg Oral QID  . guaiFENesin  1,200 mg Oral BID  . levofloxacin  750 mg Oral Daily  . lidocaine  1 patch Transdermal Q24H  . pantoprazole  40 mg Oral BID AC  . piperacillin-tazobactam (ZOSYN)  IV  3.375 g Intravenous Q8H  . vancomycin  750 mg Intravenous Q12H  . [EXPIRED] vitamin A & D       Infusions:     . sodium chloride 100 mL/hr at 01/02/12 2208   Assessment:  61 y/o F admitted with pneumonia  that was community-acquired but occurred in the setting of bronchiectasis and did not resolve after prior outpatient antibiotics.  Now on D#4 of Vancomycin 750 mg IV q12h / Zosyn 3.375 grams IV q8h (extended-infusion) / Levofloxacin 750 mg PO q24h.  Mention of outpatient IV antibiotics noted.  Goal of Therapy:   Vancomycin trough level 15-20 mcg/ml  Eradication of infection  Plan:  1. Vancomycin trough today at 3pm. 2. Serum creatinine as part of BMet already ordered for tomorrow AM.  Clayburn Pert, PharmD, BCPS Pager: [phone] 01/03/2012  9:36 AM

## 2012-01-04 LAB — FUNGUS CULTURE W SMEAR
Fungal Smear: NONE SEEN
Special Requests: NORMAL

## 2012-01-04 LAB — BASIC METABOLIC PANEL
BUN: 8 mg/dL (ref 6–23)
Calcium: 8.8 mg/dL (ref 8.4–10.5)
GFR calc Af Amer: >90 mL/min (ref >90)
GFR calc non Af Amer: >90 mL/min (ref >90)
Potassium: 3.8 mEq/L (ref 3.5–5.1)

## 2012-01-04 LAB — CBC
Hemoglobin: 7.2 g/dL — ABNORMAL LOW (ref 12.0–15.0)
MCHC: 30.4 g/dL (ref 30.0–36.0)
Platelets: 397 10*3/uL (ref 150–400)
RDW: 19.4 % — ABNORMAL HIGH (ref 11.5–15.5)

## 2012-01-04 LAB — IRON AND TIBC
Saturation Ratios: 18 % — ABNORMAL LOW (ref 20–55)
UIBC: 194 ug/dL (ref 125–400)

## 2012-01-04 MED ORDER — POLYVINYL ALCOHOL 1.4 % OP SOLN
1.0000 [drp] | OPHTHALMIC | Status: DC | PRN
  Administered 2012-01-05: 1 [drp] via OPHTHALMIC
  Filled 2012-01-04: qty 15

## 2012-01-04 MED ORDER — VANCOMYCIN HCL 1000 MG IV SOLR
1250.0000 mg | Freq: Two times a day (BID) | INTRAVENOUS | Status: DC
  Administered 2012-01-04 – 2012-01-05 (×3): 1250 mg via INTRAVENOUS
  Filled 2012-01-04 (×3): qty 1250

## 2012-01-04 NOTE — Progress Notes (Signed)
ANTIBIOTIC CONSULT NOTE - INITIAL  Pharmacy Consult for Vancomycin/Zosyn  Indication: Pneumonia in setting of bronchiectasis   Allergies  Allergen Reactions  . Etodolac     REACTION: rash  . Megestrol Acetate     REACTION: rash    Patient Measurements: Height: _0  (165.1 cm) Weight: 119 lb 7.8 oz (54.2 kg) IBW/kg (Calculated) : 57    Vital Signs: Temp: 98.3 [name] (36.8 C) (11/12 0600) Temp src: Oral (11/12 0600) BP: 121/68 mmHg (11/12 0600) Pulse Rate: 99  (11/12 0600) Intake/Output from previous day: 11/11 0701 - 11/12 0700 In: 960 [P.O.:960] Out: -  Intake/Output from this shift:    Labs:  [facility] 01/04/12 0505 01/03/12 0502  WBC 7.1 5.8  HGB 7.2* 6.6*  PLT 397 352  LABCREA -- --  CREATININE 0.63 --   Estimated Creatinine Clearance: 64.8 ml/min (by C-G formula based on Cr of 0.63).  Basename 01/03/12 1500  VANCOTROUGH 10.8  VANCOPEAK --  VANCORANDOM --  GENTTROUGH --  [place] --  AMIKACIN --     Microbiology: Recent Results (from the past 720 hour(s))  AFB CULTURE WITH SMEAR     Status: Normal (Preliminary result)   Collection Time   12/07/11  8:38 AM      Component Value Range Status Comment   Specimen Description BRONCHIAL ALVEOLAR LAVAGE   Final    Special Requests Normal   Final    ACID FAST SMEAR NO ACID FAST BACILLI SEEN   Final    Culture     Final    Value: CULTURE WILL BE EXAMINED FOR 6 WEEKS BEFORE ISSUING A FINAL REPORT   Report Status PENDING   Incomplete   CULTURE, BAL-QUANTITATIVE     Status: Normal   Collection Time   12/07/11  8:38 AM      Component Value Range Status Comment   Specimen Description BRONCHIAL ALVEOLAR LAVAGE   Final    Special Requests Normal   Final    Gram Stain     Final    Value: FEW WBC PRESENT, PREDOMINANTLY PMN     NO SQUAMOUS EPITHELIAL CELLS SEEN     NO ORGANISMS SEEN   Colony Count >=100,000 COLONIES/ML    Final    Culture Non-Pathogenic Oropharyngeal-type Flora Isolated.   Final    Report Status 12/09/2011 FINAL   Final   FUNGUS CULTURE W SMEAR     Status: Normal (Preliminary result)   Collection Time   12/07/11  8:38 AM      Component Value Range Status Comment   Specimen Description BRONCHIAL ALVEOLAR LAVAGE   Final    Special Requests Normal   Final    Fungal Smear NO YEAST OR FUNGAL ELEMENTS SEEN   Final    Culture CULTURE IN PROGRESS FOR FOUR WEEKS   Final    Report Status PENDING   Incomplete   RESPIRATORY CULTURE OR RESPIRATORY AND SPUTUM CULTURE     Status: Normal   Collection Time   12/17/11  4:31 PM      Component Value Range Status Comment   GRAM STAIN Moderate   Final    GRAM STAIN WBC present-predominately PMN   Final    GRAM STAIN Rare Squamous Epithelial Cells Present   Final    GRAM STAIN Abundant Gram Negative Rods   Final    GRAM STAIN Few Gram Positive Rods  Final    GRAM STAIN Rare GRAM POSITIVE COCCI IN PAIRS   Final    Organism ID, Bacteria Normal Oropharyngeal Flora   Final   CULTURE, EXPECTORATED SPUTUM-ASSESSMENT     Status: Normal   Collection Time   12/31/11 11:22 PM      Component Value Range Status Comment   Specimen Description SPUTUM   Final    Special Requests NONE   Final    Sputum evaluation     Final    Value: THIS SPECIMEN IS ACCEPTABLE. RESPIRATORY CULTURE REPORT TO FOLLOW.   Report Status 01/01/2012 FINAL   Final   CULTURE, RESPIRATORY     Status: Normal   Collection Time   12/31/11 11:22 PM      Component Value Range Status Comment   Specimen Description SPUTUM   Final    Special Requests NONE   Final    Gram Stain     Final    Value: ABUNDANT WBC PRESENT,BOTH PMN AND MONONUCLEAR     FEW SQUAMOUS EPITHELIAL CELLS PRESENT     MODERATE GRAM NEGATIVE RODS     MODERATE GRAM VARIABLE ROD     RARE GRAM POSITIVE RODS   Culture NORMAL OROPHARYNGEAL FLORA   Final    Report Status 01/03/2012 FINAL   Final     Medical History: Past Medical  History  Diagnosis Date  . Hyperlipidemia   . Osteopenia   . C. difficile colitis   . Raynaud's disease   . Restrictive lung disease   . Hyperactive airway disease     Medications:  Scheduled:    . acidophilus  1 capsule Oral Daily  . albuterol  2.5 mg Nebulization Q6H  . DULoxetine  60 mg Oral Daily  . feeding supplement  237 mL Oral BID BM  . feeding supplement  1 Container Oral BID BM  . ferrous sulfate  325 mg Oral Q breakfast  . gabapentin  400 mg Oral QID  . guaiFENesin  1,200 mg Oral BID  . levofloxacin  750 mg Oral Daily  . lidocaine  1 patch Transdermal Q24H  . pantoprazole  40 mg Oral BID AC  . piperacillin-tazobactam (ZOSYN)  IV  3.375 g Intravenous Q8H  . vancomycin  1,000 mg Intravenous Q12H  . [DISCONTINUED] vancomycin  750 mg Intravenous Q12H   Infusions:    . sodium chloride 75 mL/hr at 01/04/12 0937   PRN: albuterol, oxyCODONE, oxyCODONE-acetaminophen Assessment:  Deborah [name] admitted with pneumonia that was community-acquired but occurred in the setting of bronchiectasis and did not resolve after prior outpatient antibiotics.  Now on D#5 Vancomycin/Zosyn/Levaquin   Renal function stable  Based on Vancomycin trough 10.8 last night, will increase dose further to aim for trough 15-20.   Recommend repeat trough in 2-3 days if continued outpatient   Goal of Therapy:  Vancomycin trough level 15-20 mcg/ml  Plan:  1.) Increase Vancomycin 1250 mg IV q12h  2.) Monitor renal function  3.) Would repeat vancomycin trough as outpatient if vancomycin continued (about 2-3 days after dose change today)  Derriana Oser, Gaye Alken PharmD Pager #: [phone] 10:26 AM 01/04/2012

## 2012-01-04 NOTE — Progress Notes (Signed)
Subjective: Feels much better/stronger.  Decreased cough and quantity of mucus.  Less congested.  Cbc better today.  Wants to go home  Objective: Vital signs in last 24 hours: Blood pressure 121/68, pulse 99, temperature 98.3 F (36.8 C), temperature source Oral, resp. rate 16, height 5' 5" (1.651 m), weight 119 lb 7.8 oz (54.2 kg), SpO2 90.00%.  Intake/Output from previous day: 11/11 0701 - 11/12 0700 In: 48 [P.O.:960] Out: -    Physical Exam:   frail appearing female in nad Nose without purulence or discharge OP clear Neck without LN, TMG Chest with decreased bs and crackles on left, right clear. Cor with minimal regular tachy abd benign LE without edema, no cyanosis Alert and oriented, moves all 4.    Lab Results:  [facility] 01/04/12 0505 01/03/12 0502  WBC 7.1 5.8  HGB 7.2* 6.6*  HCT 23.7* 22.7*  PLT 397 352   BMET  Basename 01/04/12 0505  NA 141  K 3.8  CL 104  CO2 28  GLUCOSE 128*  BUN 8  CREATININE 0.63  CALCIUM 8.8    Studies/Results: No results found.  Assessment/Plan: Patient Active Hospital Problem List:  Community acquired pna:  The pt's ct chest appears worse from her ct in [place] at [facility].  Unfortunately, we have not been able to identify a pathogen despite multiple cultures and bronchoscopy.  She did have narrowing/scarring of lingular bronchus.  ? Drainage issue.  Will need to continue IV abx in light of failure to respond to oral abx as an outpt.  Will probably need home IV abx for 7-10 days at discharge.  Will place a picc line, and will require abx for both aerobic and anaerobic bacteria at discharge.  ? If this is the reason all cultures have been negative although clinically she clearly has pna?  Anemia:  She has a h/o of this, and is on iron supplements.  I suspect there is also a component of anemia of chronic disease.  She is also malnourished with low albumin.  Will check stool, although no history to support GIB. Iron panel shows only  mild deficiency.  Hold off on transfusion for now since pt is stable.  Will need workup as outpt.    Kathee Delton, M.D. 01/04/2012, 1:45 PM

## 2012-01-04 NOTE — Progress Notes (Signed)
Accessed RT basilic vein on 1st attempt without difficulty however unable to thread the PICC beyond the subclavian area.   Kept meeting resistance.  Primary RN made aware and order was entered for IR to insert PICC in the morning of 11/13.   Did not call IR due to being after hours.

## 2012-01-04 NOTE — Progress Notes (Signed)
RT delivered CPT Vest to PT room 1518 at [phone]- per PT statement- just finished breakfast and getting ready to take pain medication. PT, RT and RN agreed that RT would come back at approximately 1030 for therapy.

## 2012-01-04 NOTE — Progress Notes (Signed)
Peripherally Inserted Central Catheter/Midline Placement  The IV Nurse has discussed with the patient and/or persons authorized to consent for the patient, the purpose of this procedure and the potential benefits and risks involved with this procedure.  The benefits include less needle sticks, lab draws from the catheter and patient may be discharged home with the catheter.  Risks include, but not limited to, infection, bleeding, blood clot (thrombus formation), and puncture of an artery; nerve damage and irregular heat beat.  Alternatives to this procedure were also discussed.  PICC/Midline Placement Documentation        Deborah [name] 01/04/2012, 7:17 PM

## 2012-01-04 NOTE — Progress Notes (Signed)
Pt refused PM Chest Vest treatment. The pt's and her husband were very upset because it caused the patient much pain during earlier treatment. Pt expressed that she still had not fully recovered around 2100. RT consulted with pt & explained why the procedure was being preformed & the importance. The patient continued to refuse treatment stating that she just did not want to do it. Bobbye Charleston, RN witnessed conversation. RT will continue to monitor.

## 2012-01-05 ENCOUNTER — Inpatient Hospital Stay (HOSPITAL_COMMUNITY): Payer: BC Managed Care – PPO

## 2012-01-05 ENCOUNTER — Telehealth: Payer: Self-pay | Admitting: Pulmonary Disease

## 2012-01-05 ENCOUNTER — Telehealth: Payer: Self-pay | Admitting: Internal Medicine

## 2012-01-05 LAB — CBC
MCV: 78.5 fL (ref 78.0–100.0)
Platelets: 376 10*3/uL (ref 150–400)
RBC: 3.39 MIL/uL — ABNORMAL LOW (ref 3.87–5.11)
WBC: 7.9 10*3/uL (ref 4.0–10.5)

## 2012-01-05 MED ORDER — VANCOMYCIN HCL 1000 MG IV SOLR: 1250.0000 mg | Freq: Two times a day (BID) | INTRAVENOUS | Status: DC

## 2012-01-05 MED ORDER — PIPERACILLIN-TAZOBACTAM 3.375 G IVPB: 3.3750 g | Freq: Three times a day (TID) | INTRAVENOUS | Status: DC

## 2012-01-05 MED ORDER — IOHEXOL 300 MG/ML  SOLN: 5.0000 mL | Freq: Once | INTRAMUSCULAR | Status: DC | PRN

## 2012-01-05 MED ORDER — SACCHAROMYCES BOULARDII 250 MG PO CAPS: 250.0000 mg | ORAL_CAPSULE | Freq: Two times a day (BID) | ORAL | Status: DC

## 2012-01-05 NOTE — Procedures (Signed)
Successful placement of Rt UE PICC line to basilic vein It should be noted that due to subclavian vein occlusion, this is a "Midline" as the tip is in the axillary vein, length16cm It should be used as 'Midline access' No complications  Vincent Gros, [facility] 01/05/2012 3:17 PM

## 2012-01-05 NOTE — Progress Notes (Signed)
Pharmacy: Vancomycin   Noted discharge orders for Vancomycin 1250 mg IV q12h.  Recommended to [place] to obtain BMET and vancomycin trough prior to dose tomorrow morning  Thanks Glee Arvin PharmD Pager #: [phone] 3:25 PM 01/05/2012

## 2012-01-05 NOTE — Progress Notes (Signed)
Pt and Spouse were agitated tonight.  They were disappointed and angered by the chest physiotherapy, stating that it caused more pain. I have reminded the patient and spouse they the patient always has the right to refuse prescribed treatments.  I encouraged them to continue to express needs and apologized for the pain that chest physiotherapy caused.  The patient is stable and resting, with some dyspnea when she gets up to the bathroom.

## 2012-01-05 NOTE — Progress Notes (Signed)
Patient known to me w/ hx C diff. Courtesy visit. She did not tell Pulmonary but has had 2-3 diarrheal stools this AM. Will check for C diff. I have paged Richardson Landry Minor but not yet spoken.  Gatha Mayer, MD, [facility] [phone] (pager) 01/05/2012 2:35 PM

## 2012-01-05 NOTE — Discharge Summary (Signed)
Physician Discharge Summary  Patient ID: Deborah [name] MRN: [id number] DOB/AGE: 22-Nov-1950 61 y.o.  Admit date: 12/31/2011 Discharge date: 01/05/2012  Problem List Principal Problem:  *Pulmonary infiltrates Active Problems:  HYPERTENSION  REACTIVE AIRWAY DISEASE  Restrictive lung disease  HPI: The patient is a very pleasant 61 year old  female with a long history of pulmonary issues. She has a history of  neuroendocrine tumor with resection many years ago at [facility], complicated by bronchopleural fistula and chest wall resection.  This has left her with significant restrictive lung disease, as well as  some underlying bronchiectasis. The patient also has some mild reactive  airways disease by pulmonary function studies. She was in her usual  state of health until a trip to [place] back in mid August, where she  developed pneumonia. She was treated with antibiotics and had some  improvement but never returned to her normal baseline. She was treated  with a second round of broad-spectrum antibiotics, again without  significant change. She was seen in the office the first week of  October where she was continuing to have chest congestion, cough, as  well as purulent mucus. She was also having general malaise and low-  grade fever at times. She had a followup CT at [facility] that  showed patchy ground-glass opacities in both lungs. At that time, I was  concerned that she had ongoing pneumonia that was not responding to  typical antibiotic therapy, and the patient underwent fiberoptic  bronchoscopy with BAL. She grew numerous organisms on her gram stain,  but the culture showed only normal oropharyngeal flora. The patient  continued to have purulent mucus, and I have her deliver her sputum  specimen to the office which again showed numerous organisms but did not  grow any specific pathogen. The plan was to do a followup CT chest to  see if things had  improved or stayed the same, however, she presented to  the office today with worsening cough and congestion, increased  shortness of breath, low-grade fever, as well as poor p.o. intake and  weight loss. Her husband was concerned about her being dehydrated, and  her blood pressure was lower than usual with some baseline tachycardia.  Because of how ill she appears, she is being admitted for broad-spectrum  IV antibiotics and re-evaluation by CT chest.  Hospital Course: Assessment/Plan:  Patient Active Hospital Problem List:  Community acquired pna:  The pt's ct chest appears worse from her ct in [place] at [facility]. Unfortunately, we have not been able to identify a pathogen despite multiple cultures and bronchoscopy. She did have narrowing/scarring of lingular bronchus. ? Drainage issue. Will need to continue IV abx in light of failure to respond to oral abx as an outpt. Will  need home IV abx Vancomycin/Zoysn  for 7-10 days at discharge. Will place a picc line, and will require abx for both aerobic and anaerobic bacteria at discharge. ? If this is the reason all cultures have been negative although clinically she clearly has pna by history and radio graphically.  Anemia:  She has a h/o of this, and is on iron supplements. I suspect there is also a component of anemia of chronic disease and from hemodilution from volume resuscitation.  She is also malnourished with low albumin. Will check stool, although no history to support GIB. Iron panel shows only mild deficiency.  Stools were no checked this admission. Hold off on transfusion for now since pt is stable.  Will need workup as outpt. We will send a copy of this discharge to Dr. Fuller Plan of GI services.     Labs at discharge Lab Results  Component Value Date   CREATININE 0.63 01/04/2012   BUN 8 01/04/2012   NA 141 01/04/2012   K 3.8 01/04/2012   CL 104 01/04/2012   CO2 28 01/04/2012   Lab Results  Component Value Date   WBC 7.9 01/05/2012     HGB 8.0* 01/05/2012   HCT 26.6* 01/05/2012   MCV 78.5 01/05/2012   PLT 376 01/05/2012   Lab Results  Component Value Date   ALT 16 12/31/2011   AST 16 12/31/2011   ALKPHOS 212* 12/31/2011   BILITOT 0.2* 12/31/2011   Lab Results  Component Value Date   INR 1.04 01/03/2012    Current radiology studies *RADIOLOGY REPORT*  Clinical Data: Evaluate pulmonary infiltrates.  CT CHEST WITHOUT CONTRAST  Technique: Multidetector CT imaging of the chest was performed  following the standard protocol without IV contrast.  Comparison: Chest x-ray 10/12/2011. Chest CT 11/16/2002.  Findings:  Mediastinum: Chronic shift of cardiomediastinal structures into the  left hemithorax which is very similar to remote prior examination  from 2004. Heart size is normal. There is no significant  pericardial fluid, thickening or pericardial calcification.  Pulmonic trunk appears dilated (3.4 cm in diameter), which could  suggest pulmonary arterial hypertension. Numerous borderline  enlarged and mildly enlarged mediastinal lymph nodes, largest of  which is in the prevascular stations measuring up to 1.6 cm in  short axis (image 13 of series 2). Prominent nodal tissue in and  around the left hilar region is also noted (difficult to assess on  the noncontrast study). Esophagus is unremarkable in appearance.  There is atherosclerosis of the thoracic aorta, the great vessels  of the mediastinum and the coronary arteries, including calcified  atherosclerotic plaque in the left anterior descending and left  circumflex coronary arteries.  Lungs/Pleura: Status post left lower lobectomy. In the posterior  aspect of the lower left hemithorax there is a chronic gas and  fluid collection with surrounding pleural thickening and pleural  calcification which appears similar to the remote prior examination  11/19/2002. There is extensive scarring throughout the left upper  lobe with chronic volume loss in addition to  peribronchovascular  thickening and patchy airspace consolidation, which is suspicious  for superimposed acute infection. There is a very faint pattern of  peribronchovascular ground-glass attenuation scattered throughout  the right upper lobe which may reflect some endobronchial spread of  infection. There are a few scattered pulmonary nodules ranging in  size from 2-5 mm throughout the right lung, with the largest  measuring 5 mm in the right upper lobe (image 18 of series 7).  Small amount of right pleural fluid and/or thickening inferiorly.  High resolution images show no frank reticulation, honeycombing or  traction bronchiectasis to strongly suggest the presence of  interstitial lung disease.  Upper Abdomen: Unremarkable.  Musculoskeletal: Post thoracotomy changes in the left ribs.  Multiple borderline enlarged and mildly enlarged left axillary  lymph nodes, largest of which measures up to 11 mm (image 13 of  series 2).  IMPRESSION:  1. The appearance of the left upper lobe is suggest acute  infection superimposed upon chronic scarring. There are some  patchy interstitial and ground-glass opacities throughout the right  upper lobe as well, which may reflect some early endobronchial  spread of infection.  2. Status post left lower lobectomy with  chronic left pleural gas  and fluid collection which is partially calcified. This is similar  to remote prior study 11/19/2002 making infection (i.e., acute  empyema) highly unlikely.  3. No definitive findings to suggest interstitial lung disease.  However, the scarring in the left hemithorax may simulate a  symptoms of restrictive lung disease.  4. Dilatation of the pulmonic trunk suggesting pulmonary arterial  hypertension.  5. Lymphadenopathy in the left axilla and mediastinum, as above.  This is nonspecific, and given the chronic postoperative changes in  the left hemithorax this is favored to be reactive. If for some  reason  there is clinical suspicion for malignancy, this could be  better evaluated with PET imaging.  Original Report Authenticated By: Vinnie Langton, M.D. Disposition:  01-Home or Self Care  Discharge Orders    Future Appointments: Provider: Department: Dept Phone: Center:   01/19/2012 10:30 AM Kathee Delton, MD [facility] [phone] None     Future Orders Please Complete By Expires   Discharge patient      Comments:   Discharge after picc line placed.       Medication List     As of 01/05/2012 12:00 PM    STOP taking these medications         lisinopril 2.5 MG tablet   Commonly known as: PRINIVIL,ZESTRIL      TAKE these medications         acidophilus Caps   Take 1 capsule by mouth daily.      albuterol 108 (90 BASE) MCG/ACT inhaler   Commonly known as: PROVENTIL HFA;VENTOLIN HFA   Inhale 2 puffs into the lungs every 6 (six) hours as needed. For shortness of breath.      calcium-vitamin D 500-200 MG-UNIT per tablet   Commonly known as: OSCAL WITH D   Take 1 tablet by mouth daily.      dextromethorphan-guaiFENesin 30-600 MG per 12 hr tablet   Commonly known as: MUCINEX DM   Take 1 tablet by mouth 2 (two) times daily as needed. For cough/congestion.      DULoxetine 60 MG capsule   Commonly known as: CYMBALTA   Take 60 mg by mouth daily.      ferrous sulfate 325 (65 FE) MG tablet   Take 1 tablet (325 mg total) by mouth daily with breakfast.      gabapentin 400 MG capsule   Commonly known as: NEURONTIN   Take 400 mg by mouth 4 (four) times daily.      lidocaine 5 %   Commonly known as: LIDODERM   Place 0.5 patches onto the skin daily. Remove & Discard patch within 12 hours or as directed by MD      mometasone-formoterol 100-5 MCG/ACT Aero   Commonly known as: DULERA   Inhale 2 puffs into the lungs daily.      multivitamin with minerals Tabs   Take 1 tablet by mouth daily.      oxyCODONE 5 MG immediate release tablet   Commonly known as: Oxy  IR/ROXICODONE   Take 5 mg by mouth every 6 (six) hours as needed. For pain.      piperacillin-tazobactam 3.375 GM/50ML IVPB   Commonly known as: ZOSYN   Inject 50 mLs (3.375 g total) into the vein every 8 (eight) hours.      sodium chloride 0.9 % SOLN 250 mL with vancomycin 1000 MG SOLR 1,250 mg   Inject 1,250 mg into the vein every 12 (twelve) hours.  Follow-up Information    Follow up with Kathee Delton, MD. On 01/19/2012. (10:30 am)    Contact information:   [place] [address] [phone]          Discharged Condition: {good) Vital signs at Discharge. Temp:  [97.9 F (36.6 C)-98.4 F (36.9 C)] 98 F (36.7 C) (11/13 0653) Pulse Rate:  [104-112] 106  (11/13 0653) Resp:  [18-22] 18  (11/13 0653) BP: (110-168)/(64-85) 161/85 mmHg (11/13 0653) SpO2:  [95 %-100 %] 97 % (11/13 0653) Office follow up Special Information or instructions. She is to go home on 10 days of IV Vancomycin and Zoysn via PICC line. Signed: Richardson Landry Minor ACNP Maryanna Shape PCCM Pager [phone] till 3 pm If no answer page [phone] 01/05/2012, 12:00 PM

## 2012-01-05 NOTE — Telephone Encounter (Signed)
Rx was sent to pharm on file for pt LMTCB to advise the pt Will forward to LC's basket to f/u on this

## 2012-01-05 NOTE — Discharge Summary (Signed)
The pt was seen on dicharge today and examined.  She is ready for d/c home after picc line with home abx.  She will need to see me after completing.  Will send a note to her primary re:  Anemia.  She will probably need further w/u as outpt if doesn't improve.

## 2012-01-05 NOTE — Telephone Encounter (Signed)
Patient had history with Dr. Carlean Purl in 05 for c-diff colitis.  She is currently admitted to the hospital and on IV vanc, zosyn, and levaquin.  Her husband is concerned about c-diff and wanted to have a container at home in case she develops diarrhea.  He is asking for an order for c-diff check.  I have asked him to discuss with Dr. Gwenette Greet about orders as he has her admitted at this time.  I have advised him in the case that she would develop diarrhea we are happy to see her.  He will call Dr. Gwenette Greet and will call me back if she has any GI complaints after discharge.

## 2012-01-05 NOTE — Progress Notes (Signed)
Patient discharged home in stable condition.  Discharge instructions given with verbal feedback and understanding.  MyChart set up at discharge.

## 2012-01-05 NOTE — Progress Notes (Signed)
[facility] services for IV antibiotics will be provided by [facility] which was pt's choice. She stated her husband will be able to assist with her care.

## 2012-01-05 NOTE — Telephone Encounter (Signed)
Please call in for pt:  florastor probiotic 259m one bid for 7 days. Let them know this is being called in.

## 2012-01-06 ENCOUNTER — Telehealth: Payer: Self-pay | Admitting: Pulmonary Disease

## 2012-01-06 DIAGNOSIS — J189 Pneumonia, unspecified organism: Secondary | ICD-10-CM

## 2012-01-06 NOTE — Telephone Encounter (Signed)
Per [place], the pt should continue on the [facility] for now. Pt informed and verbalized understanding.

## 2012-01-06 NOTE — Telephone Encounter (Signed)
No, she needs to get all of her antibiotics.  What kind of issues with picc??  This was placed by radiology and should be working fine.

## 2012-01-06 NOTE — Telephone Encounter (Signed)
sandra called back from [facility] please call back

## 2012-01-06 NOTE — Telephone Encounter (Signed)
Spoke with pt She is asking what can be done about the picc line (see other phone note dated today) I advised that per [facility], she needs to be re evaluated by IR to see if they can fix this so she can get all of her medication. I had assumed that her nurse who was administering the med had notified her of this, but she did not Pt verbalized understanding  I called and spoke with Katharine Look, the home health nurse administering med and asked her to please give the pt a call. She states she will do this.

## 2012-01-06 NOTE — Telephone Encounter (Signed)
Spoke with pt. She states that she is having issues with the midline, it flushes but will not run. She suspects that this is b/c of previous picc lines and scar tissue. She states pt will need to be referred back to IR for eval. Please advise if okay to send order thanks!

## 2012-01-06 NOTE — Telephone Encounter (Signed)
Spoke with Katharine Look with [facility] She states having major issues with pt's picc line  She wants to know if okay to just give am and pm dose of zosyn today, and omit the afternoon dose.  Please advise, thanks!

## 2012-01-06 NOTE — Telephone Encounter (Signed)
Order was sent to [facility] Spoke with Katharine Look and notified that this was done.  She verbalized understanding and states nothing further needed.

## 2012-01-06 NOTE — Telephone Encounter (Signed)
Pt is aware of RX for Florastar and directions for use. Pt says it wasn't clear at discharge if she should continue on Dulera. [place], this is still on her med list but please clarify.

## 2012-01-06 NOTE — Telephone Encounter (Signed)
Per tina order needs to go in as [id number]. I have placed order. Nothing further was needed

## 2012-01-06 NOTE — Telephone Encounter (Signed)
Need to send back to IR to re-evaluate.

## 2012-01-07 ENCOUNTER — Ambulatory Visit (HOSPITAL_COMMUNITY)
Admission: RE | Admit: 2012-01-07 | Discharge: 2012-01-07 | Disposition: A | Discharge status: Home / Self Care | Payer: BC Managed Care – PPO | Source: Ambulatory Visit | Attending: Pulmonary Disease | Admitting: Pulmonary Disease

## 2012-01-07 DIAGNOSIS — J189 Pneumonia, unspecified organism: Secondary | ICD-10-CM

## 2012-01-07 LAB — TYPE AND SCREEN: Antibody Screen: NEGATIVE

## 2012-01-10 ENCOUNTER — Telehealth: Payer: Self-pay | Admitting: Family Medicine

## 2012-01-10 NOTE — Telephone Encounter (Signed)
Deborah [name], this patient will not be making a post hosp w/ Dr. Leanne Chang (see earlier notes). She is still on IV abx for the next 7-10 days. Also, she likes Dr. Leanne Chang, but due to his lack of availability, she is leaving [facility] and switching doctors. She is establishing w/ Dr. Crist Infante at [facility] - has appt with him this Thursday. I have removed Dr. Leanne Chang as her PCP effective 01/13/12.

## 2012-01-11 ENCOUNTER — Telehealth: Payer: Self-pay | Admitting: Pulmonary Disease

## 2012-01-11 ENCOUNTER — Other Ambulatory Visit: Payer: Self-pay | Admitting: Pulmonary Disease

## 2012-01-11 DIAGNOSIS — J984 Other disorders of lung: Secondary | ICD-10-CM

## 2012-01-11 DIAGNOSIS — R918 Other nonspecific abnormal finding of lung field: Secondary | ICD-10-CM

## 2012-01-11 NOTE — Telephone Encounter (Signed)
I spoke with Deborah [name] ([place]). She had 1st visit with p today. She has temp of 99.9,  Skin is pale, 84% RA at rest (does not have oxygen), demininshed wheeze. She is on vancomycin and zosyn. She is going to do labs on pt today since being on these ABX. No available openings today [facility]. Please advise thanks

## 2012-01-11 NOTE — Telephone Encounter (Signed)
Kc spoke with patient, gave recommendations. No appointment needed at this time.  Will sign off on message.

## 2012-01-11 NOTE — Telephone Encounter (Signed)
No further diarrhea Discussed with husband She is actually constipated - I advised MiraLax

## 2012-01-11 NOTE — Telephone Encounter (Signed)
I received a call today from the patient's home health nurse who was concerned about the patient's condition.  She has been receiving home IV antibiotics after a recent hospitalization for pneumonia, but missed 2 days of IV antibiotics because of a question of her PIC line functioning.  It is now working properly and she is receiving antibiotics.  She did very well while in the hospital on antibiotics, but after missing her doses began to have low grade fever with some worsening mucous and increased shortness of breath as well as cough.  Her oxygen saturations were checked today and were 84%, however the patient has a known history of Raynaud's and is difficult to get saturations at times.  I talked personally to her husband and the patient herself, and neither want for her to return to the hospital.  I think we need to see how she does now that she is back on her antibiotics, and will also start her on oxygen at home.  They have agreed to call me if she begins to have more fever, or if she is worsening clinically.

## 2012-01-11 NOTE — Telephone Encounter (Signed)
Sounds like she needs to be seen.  ?TP or anyone else? If she does not look good, the only thing to do is put her back in hospital? Minimally, will need to be started on oxygen.

## 2012-01-12 ENCOUNTER — Telehealth: Payer: Self-pay | Admitting: Pulmonary Disease

## 2012-01-13 NOTE — Telephone Encounter (Signed)
Patient spoke with [facility] 01/11/12. Patient to call if anything further needed.   Will close this message.

## 2012-01-16 ENCOUNTER — Telehealth: Payer: Self-pay | Admitting: Family Medicine

## 2012-01-16 NOTE — Telephone Encounter (Signed)
I received a call regarding this patient, whose picc line is leaking currently. It looks as if this patient has left [facility] and is seeing Dr. Joylene Draft for her PCP and [facility] has been managing throughout her course of treatment and with ABX. Recommended that they touch base with Pulmonologist on-call. I really think most appropriate for managing group / MD's to triage this.  Owens Loffler, MD 01/16/2012, 9:46 AM

## 2012-01-17 ENCOUNTER — Other Ambulatory Visit (HOSPITAL_COMMUNITY): Payer: Self-pay | Admitting: Internal Medicine

## 2012-01-18 ENCOUNTER — Encounter (HOSPITAL_COMMUNITY)
Admission: RE | Admit: 2012-01-18 | Discharge: 2012-01-18 | Disposition: A | Discharge status: Home / Self Care | Payer: BC Managed Care – PPO | Source: Ambulatory Visit | Attending: Internal Medicine | Admitting: Internal Medicine

## 2012-01-18 ENCOUNTER — Encounter (HOSPITAL_COMMUNITY): Payer: Self-pay

## 2012-01-18 DIAGNOSIS — D649 Anemia, unspecified: Secondary | ICD-10-CM | POA: Insufficient documentation

## 2012-01-18 MED ORDER — SODIUM CHLORIDE 0.9 % IV SOLN
Freq: Once | INTRAVENOUS | Status: AC
Start: 2012-01-18 — End: 2012-01-18
  Administered 2012-01-18: 11:00:00 via INTRAVENOUS

## 2012-01-18 MED ORDER — SODIUM CHLORIDE 0.9 % IV SOLN
1020.0000 mg | Freq: Once | INTRAVENOUS | Status: AC
  Administered 2012-01-18: 1020 mg via INTRAVENOUS
  Filled 2012-01-18: qty 34

## 2012-01-19 ENCOUNTER — Ambulatory Visit (INDEPENDENT_AMBULATORY_CARE_PROVIDER_SITE_OTHER)
Admission: RE | Admit: 2012-01-19 | Discharge: 2012-01-19 | Disposition: A | Discharge status: Home / Self Care | Payer: BC Managed Care – PPO | Source: Ambulatory Visit | Attending: Pulmonary Disease | Admitting: Pulmonary Disease

## 2012-01-19 ENCOUNTER — Encounter: Payer: Self-pay | Admitting: Pulmonary Disease

## 2012-01-19 ENCOUNTER — Ambulatory Visit (INDEPENDENT_AMBULATORY_CARE_PROVIDER_SITE_OTHER): Payer: BC Managed Care – PPO | Admitting: Pulmonary Disease

## 2012-01-19 VITALS — BP 140/80 | HR 113 | Temp 98.3°F | Ht 65.0 in | Wt 120.2 lb

## 2012-01-19 DIAGNOSIS — R918 Other nonspecific abnormal finding of lung field: Secondary | ICD-10-CM

## 2012-01-19 DIAGNOSIS — J984 Other disorders of lung: Secondary | ICD-10-CM

## 2012-01-19 LAB — AFB CULTURE WITH SMEAR (NOT AT ARMC)
Acid Fast Smear: NONE SEEN
Special Requests: NORMAL

## 2012-01-19 NOTE — Patient Instructions (Addendum)
Will check a chest xray today, and call you with results. followup with me in 4 weeks.

## 2012-01-19 NOTE — Progress Notes (Signed)
  Subjective:    Patient ID: Deborah [name], female    DOB: 1950-08-28, 61 y.o.   MRN: [id number]  HPI The pt comes in today for f/u of her recent PNA.  She was treated with IV vanco and zosyn for 2 weeks (completed at home), and clearly has improved.  Her quantity of mucus has gone down, but she is still draining orange/red discolored mucus at times.  There is no blood in the specimen she presented today.  She has been off IV antibiotics for almost 4 days now, and has not had worsening symptoms, fever, or increased quantity of mucus.  She is extremely weak, and is going to work on her conditioning.  She has had issues with anemia, and has been iron deficient, and recently received IV iron infusion.  She has also had renal insufficiency that has improved with increased p.o. Intake.   Review of Systems  Constitutional: Negative for fever and unexpected weight change.  HENT: Negative for ear pain, nosebleeds, congestion, sore throat, rhinorrhea, sneezing, trouble swallowing, dental problem, postnasal drip and sinus pressure.   Eyes: Negative for redness and itching.       Dry eyes  Respiratory: Positive for shortness of breath ( wears 2L 24/7---doesnt have with her, in car ) and wheezing. Negative for cough and chest tightness.   Cardiovascular: Negative for palpitations and leg swelling.  Gastrointestinal: Negative for nausea and vomiting.  Genitourinary: Negative for dysuria.  Musculoskeletal: Negative for joint swelling.  Skin: Negative for rash.  Neurological: Positive for headaches.  Hematological: Does not bruise/bleed easily.  Psychiatric/Behavioral: Negative for dysphoric mood. The patient is not nervous/anxious.        Objective:   Physical Exam Thin female in nad Nose without purulence or discharge noted. Neck without LN or TMG. Chest with decreased bs and a few crackles on the left.  Right clear.  Cor with rrr LE without edema, no cyanosis Alert and oriented, moves all 4.          Assessment & Plan:

## 2012-01-19 NOTE — Addendum Note (Signed)
Addended by: Thedore Mins D on: 01/19/2012 11:35 AM   Modules accepted: Orders

## 2012-01-19 NOTE — Assessment & Plan Note (Signed)
The patient is much improved after being treated with 2 weeks of vancomycin and Zosyn, and therefore her infiltrates most likely were due to pneumonia.  Now that she is off antibiotics, we need to follow her closely to see if there is a recurrence of her symptoms.  She had narrowing of her lingular bronchus at bronchoscopy, and the question is raised whether recurrent infection could be caused by poor drainage.  My other concern is her air and fluid loculation at the left base after her surgery, and whether this could be infected.   We will see how she responds over the next 4 weeks, and if she has a recurrence of symptoms, it may be worthwhile to get her back to her thoracic surgeon at [facility] to offer his opinion.

## 2012-01-21 ENCOUNTER — Encounter: Payer: Self-pay | Admitting: Pulmonary Disease

## 2012-02-14 ENCOUNTER — Encounter: Payer: Self-pay | Admitting: Pulmonary Disease

## 2012-02-14 ENCOUNTER — Ambulatory Visit (INDEPENDENT_AMBULATORY_CARE_PROVIDER_SITE_OTHER)
Admission: RE | Admit: 2012-02-14 | Discharge: 2012-02-14 | Disposition: A | Discharge status: Home / Self Care | Payer: BC Managed Care – PPO | Source: Ambulatory Visit | Attending: Pulmonary Disease | Admitting: Pulmonary Disease

## 2012-02-14 ENCOUNTER — Ambulatory Visit (INDEPENDENT_AMBULATORY_CARE_PROVIDER_SITE_OTHER): Payer: BC Managed Care – PPO | Admitting: Pulmonary Disease

## 2012-02-14 VITALS — BP 132/82 | HR 102 | Temp 98.2°F | Ht 65.0 in | Wt 120.0 lb

## 2012-02-14 DIAGNOSIS — J189 Pneumonia, unspecified organism: Secondary | ICD-10-CM

## 2012-02-14 DIAGNOSIS — J984 Other disorders of lung: Secondary | ICD-10-CM

## 2012-02-14 DIAGNOSIS — R918 Other nonspecific abnormal finding of lung field: Secondary | ICD-10-CM

## 2012-02-14 NOTE — Assessment & Plan Note (Signed)
The patient has had significant improvement in her pulmonary symptoms, and clearly looks a whole lot better than her last visit.  Her airway symptoms have nearly resolved, and she is not requiring oxygen during the day.  Because of her underlying left lung issue, she remains at risk for recurrent pulmonary infections, just as a bronchiectatic patient would be.  We also need to keep a close eye on her pleural fluid collection at the bottom of the left lung.

## 2012-02-14 NOTE — Progress Notes (Signed)
  Subjective:    Patient ID: Deborah [name], female    DOB: 1950-11-04, 61 y.o.   MRN: [id number]  HPI The patient comes in today for followup of her known chronic left lung disease, as well as mild obstructive airways disease.  She has had a significant pneumonia involving the left lung that was difficult to resolve, and required IV antibiotics at home.  Her recovery has been delayed because of poor mucociliary clearance, weak cough mechanics, and malnourishment.  She was unable to tolerate a vibratory vest because of chronic left chest wall pain, but has been using a flutter valve.  She has been feeling much better since her last visit, and her rattling congestion and cough with mucus has almost totally resolved.  She continues to be extremely weak and deconditioned, and still has dyspnea on exertion.  She has not had any further fever.  Her appetite has not returned as of yet, but at least her weight loss has stabilized.  She currently is not requiring oxygen.   Review of Systems  Constitutional: Negative for fever and unexpected weight change.  HENT: Negative for ear pain, nosebleeds, congestion, sore throat, rhinorrhea, sneezing, trouble swallowing, dental problem, postnasal drip and sinus pressure.   Eyes: Negative for redness and itching.  Respiratory: Positive for shortness of breath and wheezing. Negative for cough and chest tightness.   Cardiovascular: Negative for palpitations and leg swelling.  Gastrointestinal: Positive for nausea. Negative for vomiting.  Genitourinary: Negative for dysuria.  Musculoskeletal: Negative for joint swelling.  Skin: Negative for rash.  Neurological: Negative for headaches.  Hematological: Does not bruise/bleed easily.  Psychiatric/Behavioral: Negative for dysphoric mood. The patient is not nervous/anxious.        Objective:   Physical Exam Frail appearing female in no acute distress Nose without purulence or discharge noted Neck without  lymphadenopathy or thyromegaly Chest with scattered crackles, left greater than right, and inspiratory pop in left upper lung zone. Cardiac exam with regular rate and rhythm Lower extremities without edema, no cyanosis Alert and oriented, moves all 4 extremities.       Assessment & Plan:

## 2012-02-14 NOTE — Patient Instructions (Addendum)
Will send for cxr today, and will call you with results. Will send an order for pulmonary rehab.  Let us know if you do not hear from them in 2 weeks. Push calories as much as possible.   No change in medications. followup with me in 8 weeks, but call if worsening symptoms.

## 2012-02-15 ENCOUNTER — Other Ambulatory Visit: Payer: Self-pay | Admitting: Pulmonary Disease

## 2012-02-15 DIAGNOSIS — J9600 Acute respiratory failure, unspecified whether with hypoxia or hypercapnia: Secondary | ICD-10-CM

## 2012-02-28 ENCOUNTER — Telehealth: Payer: Self-pay | Admitting: Pulmonary Disease

## 2012-02-28 NOTE — Telephone Encounter (Signed)
Error.Deborah [name] ° °

## 2012-03-02 ENCOUNTER — Encounter (HOSPITAL_COMMUNITY): Payer: Self-pay

## 2012-03-02 ENCOUNTER — Encounter (HOSPITAL_COMMUNITY)
Admission: RE | Admit: 2012-03-02 | Discharge: 2012-03-02 | Disposition: A | Discharge status: Home / Self Care | Payer: BC Managed Care – PPO | Source: Ambulatory Visit | Attending: Internal Medicine | Admitting: Internal Medicine

## 2012-03-02 DIAGNOSIS — D649 Anemia, unspecified: Secondary | ICD-10-CM | POA: Insufficient documentation

## 2012-03-02 HISTORY — DX: Essential (primary) hypertension: I10

## 2012-03-02 NOTE — Progress Notes (Signed)
Mrs. Wayment come in today for orientation to Pulmonary Rehab.  Health history and medications reviewed with patient.  Nurse assessment done.   Goals set for Pulmonary Rehab. 6 min walk test done.  She walked 780 feet in 6 min without rest break.  Oxygen dropped to 88% at 3 min.  Heart rate up to 125.  We discussed using oxygen with exercise and she is in agreement.  She plans to start exercise on 03/09/2012.  We look forward to working with this patient to help her regain her strength and improve QOL,  Leverne Humbles RN

## 2012-03-09 ENCOUNTER — Encounter (HOSPITAL_COMMUNITY)
Admission: RE | Admit: 2012-03-09 | Discharge: 2012-03-09 | Disposition: A | Discharge status: Home / Self Care | Payer: BC Managed Care – PPO | Source: Ambulatory Visit | Attending: Pulmonary Disease | Admitting: Pulmonary Disease

## 2012-03-14 ENCOUNTER — Encounter (HOSPITAL_COMMUNITY)
Admission: RE | Admit: 2012-03-14 | Discharge: 2012-03-14 | Disposition: A | Discharge status: Home / Self Care | Payer: BC Managed Care – PPO | Source: Ambulatory Visit | Attending: Pulmonary Disease | Admitting: Pulmonary Disease

## 2012-03-14 NOTE — Progress Notes (Signed)
Completed home exercise with patient. Reviewed exercise progression, routine, exercising at a comfortable pace, RPE/Dyspnea scales, how important it is to own a pulse oximeter and how to use one, weather conditions, warning signs and symptoms with exercise, and CP/NTG. We discussed when to call MD. Patient voices understanding. Patient has a goal to ride her horses again. Have the endurance to get back outside and do more with her husband and the horses. Will continue to encourage and support.

## 2012-03-16 ENCOUNTER — Encounter (HOSPITAL_COMMUNITY)
Admission: RE | Admit: 2012-03-16 | Discharge: 2012-03-16 | Disposition: A | Discharge status: Home / Self Care | Payer: BC Managed Care – PPO | Source: Ambulatory Visit | Attending: Pulmonary Disease | Admitting: Pulmonary Disease

## 2012-03-21 ENCOUNTER — Encounter (HOSPITAL_COMMUNITY)
Admission: RE | Admit: 2012-03-21 | Discharge: 2012-03-21 | Disposition: A | Discharge status: Home / Self Care | Payer: BC Managed Care – PPO | Source: Ambulatory Visit | Attending: Pulmonary Disease | Admitting: Pulmonary Disease

## 2012-03-23 ENCOUNTER — Encounter (HOSPITAL_COMMUNITY)
Admission: RE | Admit: 2012-03-23 | Discharge: 2012-03-23 | Disposition: A | Discharge status: Home / Self Care | Payer: BC Managed Care – PPO | Source: Ambulatory Visit | Attending: Pulmonary Disease | Admitting: Pulmonary Disease

## 2012-03-28 ENCOUNTER — Encounter (HOSPITAL_COMMUNITY)
Admission: RE | Admit: 2012-03-28 | Discharge: 2012-03-28 | Disposition: A | Discharge status: Home / Self Care | Payer: BC Managed Care – PPO | Source: Ambulatory Visit | Attending: Internal Medicine | Admitting: Internal Medicine

## 2012-03-28 DIAGNOSIS — D649 Anemia, unspecified: Secondary | ICD-10-CM | POA: Insufficient documentation

## 2012-03-30 ENCOUNTER — Encounter (HOSPITAL_COMMUNITY)
Admission: RE | Admit: 2012-03-30 | Discharge: 2012-03-30 | Disposition: A | Discharge status: Home / Self Care | Payer: BC Managed Care – PPO | Source: Ambulatory Visit | Attending: Pulmonary Disease | Admitting: Pulmonary Disease

## 2012-04-04 ENCOUNTER — Encounter (HOSPITAL_COMMUNITY)
Admission: RE | Admit: 2012-04-04 | Discharge: 2012-04-04 | Disposition: A | Discharge status: Home / Self Care | Payer: BC Managed Care – PPO | Source: Ambulatory Visit | Attending: Pulmonary Disease | Admitting: Pulmonary Disease

## 2012-04-06 ENCOUNTER — Encounter (HOSPITAL_COMMUNITY): Payer: BC Managed Care – PPO

## 2012-04-10 ENCOUNTER — Encounter: Payer: Self-pay | Admitting: Pulmonary Disease

## 2012-04-10 ENCOUNTER — Ambulatory Visit (INDEPENDENT_AMBULATORY_CARE_PROVIDER_SITE_OTHER): Payer: BC Managed Care – PPO | Admitting: Pulmonary Disease

## 2012-04-10 VITALS — BP 118/72 | HR 111 | Temp 97.5°F | Ht 65.0 in | Wt 122.6 lb

## 2012-04-10 DIAGNOSIS — J984 Other disorders of lung: Secondary | ICD-10-CM

## 2012-04-10 DIAGNOSIS — R918 Other nonspecific abnormal finding of lung field: Secondary | ICD-10-CM

## 2012-04-10 DIAGNOSIS — J45909 Unspecified asthma, uncomplicated: Secondary | ICD-10-CM

## 2012-04-10 NOTE — Assessment & Plan Note (Signed)
The pt feels her breathing has been better on dulera.  I would like for her to continue on this.

## 2012-04-10 NOTE — Patient Instructions (Addendum)
Stay on dulera, and we will get you a prescription for this. Continue with pulmonary rehab and putting on weight.  These are the keys to maintaining quality of life Let me know if any of the "red flags" for worsening pulmonary infection. Continue pulmonary hygiene with flutter, +/- mucinex. followup with me in 3 mos.

## 2012-04-10 NOTE — Progress Notes (Signed)
  Subjective:    Patient ID: Deborah [name], female    DOB: 1950-10-15, 62 y.o.   MRN: [id number]  HPI The patient comes in today for followup of her known chronic lung disease.  She has significant restriction involving the left lung because of prior lung and chest wall resection, and also has significant scarring and underlying bronchiectasis in the remaining left lung.  She also has underlying reactive airways disease.  Since the last visit, she has continued to improve, and has enrolled in pulmonary rehabilitation and gained weight.  She is working hard on pulmonary hygiene, and has not had a recurrence of her infection.  She has minimal mucous or rattling at this time.   Review of Systems  Constitutional: Negative for fever and unexpected weight change.  HENT: Negative for ear pain, nosebleeds, congestion, sore throat, rhinorrhea, sneezing, trouble swallowing, dental problem, postnasal drip and sinus pressure.   Eyes: Negative for redness and itching.  Respiratory: Positive for shortness of breath. Negative for cough, chest tightness and wheezing.        Congestion in mornings  Cardiovascular: Negative for palpitations and leg swelling.  Gastrointestinal: Negative for nausea and vomiting.  Genitourinary: Negative for dysuria.  Musculoskeletal: Negative for joint swelling.  Skin: Negative for rash.  Neurological: Negative for headaches.  Hematological: Does not bruise/bleed easily.  Psychiatric/Behavioral: Negative for dysphoric mood. The patient is not nervous/anxious.        Objective:   Physical Exam Thin female in no acute distress Nose with purulent discharge noted Neck without lymphadenopathy or thyromegaly Chest with crackles and pops in the left lung which are chronic, decreased breath sounds on the left, clear right lung. Cardiac exam is regular rate and rhythm Lower extremities without edema, no cyanosis Alert and oriented, moves all 4 extremities.        Assessment & Plan:

## 2012-04-10 NOTE — Assessment & Plan Note (Signed)
The pt has gotten over her recent acute process, and currently has very little cough or secretions.  I have explained to her again that we cannot completely sterilize her secretions with underlying bronchiectasis/scarring, but will manage her infections as they arise.  She needs to continue working on pulmonary hygiene and pulmonary rehab.

## 2012-04-11 ENCOUNTER — Encounter (HOSPITAL_COMMUNITY)
Admission: RE | Admit: 2012-04-11 | Discharge: 2012-04-11 | Disposition: A | Discharge status: Home / Self Care | Payer: BC Managed Care – PPO | Source: Ambulatory Visit | Attending: Pulmonary Disease | Admitting: Pulmonary Disease

## 2012-04-13 ENCOUNTER — Encounter (HOSPITAL_COMMUNITY)
Admission: RE | Admit: 2012-04-13 | Discharge: 2012-04-13 | Disposition: A | Discharge status: Home / Self Care | Payer: BC Managed Care – PPO | Source: Ambulatory Visit | Attending: Pulmonary Disease | Admitting: Pulmonary Disease

## 2012-04-18 ENCOUNTER — Encounter (HOSPITAL_COMMUNITY)
Admission: RE | Admit: 2012-04-18 | Discharge: 2012-04-18 | Disposition: A | Discharge status: Home / Self Care | Payer: BC Managed Care – PPO | Source: Ambulatory Visit | Attending: Pulmonary Disease | Admitting: Pulmonary Disease

## 2012-04-18 NOTE — Progress Notes (Signed)
Deborah [name] 62 y.o. female Nutrition Note Spoke with pt.  Wt today 55.2 kg (121.4 lb), which is up a desired 0.7 kg (1.5 lb). Pt's current wt is down 5.6-6.6 lbs from her UBW. Pt eats 3 meals a day; most meals prepared at home by pt's husband.  Pt following a high calorie, high protein diet to help promote wt gain to pt's UBWR of 127-128 lbs (57.7-58.2 kg). Pt drinking 4 Ensure or Boosts per week. Pt has liberalized her diet from her normal healthy diet to help promote wt gain. Pt c/o still not feeling hungry often "but I eat anyway." Pt reports her appetite has improved since her illness and is not back to her usual. Per discussion, pt consumes 1 serving of fruit/veggies daily. Pt encouraged to increase fruit/veggie intake. Pt's Rate Your Plate results reviewed with pt.  Pt expressed understanding.  Nutrition Diagnosis   Unintentional wt loss related to fatigue during food preparation and decreased food intake as evidenced by wt loss. Nutrition Rx/Est. Daily Nutrition Needs for: ? wt gain 2200-2700 Kcal  85-105 gm protein   1500 mg or less sodium      Nutrition Intervention   Pt's individual nutrition plan and goals reviewed with pt.   Benefits of adopting healthy eating habits discussed when pt's Rate Your Plate reviewed.   Pt to attend the Nutrition and Lung Disease class - met   Continual client-centered nutrition education by RD, as part of interdisciplinary care. Goal(s) 1. The pt will recognize symptoms that can interfere with adequate oral intake, such as shortness of breath, N/V, early satiety, fatigue, ability to secure and prepare food, taste and smell changes, chewing/swallowing difficulties, and/ or pain when eating. 2. Identify food quantities necessary to achieve wt loss of  -2# per week to a goal wt of 127-128 lbs (57.7-58.2 kg) at graduation from pulmonary rehab. 3. Describe the benefit of including fruits, vegetables, and whole grain products in a healthy meal plan. Monitor  and Evaluate progress toward nutrition goal with team.

## 2012-04-20 ENCOUNTER — Encounter (HOSPITAL_COMMUNITY)
Admission: RE | Admit: 2012-04-20 | Discharge: 2012-04-20 | Disposition: A | Discharge status: Home / Self Care | Payer: BC Managed Care – PPO | Source: Ambulatory Visit | Attending: Pulmonary Disease | Admitting: Pulmonary Disease

## 2012-04-25 ENCOUNTER — Encounter (HOSPITAL_COMMUNITY)
Admission: RE | Admit: 2012-04-25 | Discharge: 2012-04-25 | Disposition: A | Discharge status: Home / Self Care | Payer: BC Managed Care – PPO | Source: Ambulatory Visit | Attending: Internal Medicine | Admitting: Internal Medicine

## 2012-04-25 DIAGNOSIS — D649 Anemia, unspecified: Secondary | ICD-10-CM | POA: Insufficient documentation

## 2012-04-27 ENCOUNTER — Encounter (HOSPITAL_COMMUNITY)
Admission: RE | Admit: 2012-04-27 | Discharge: 2012-04-27 | Disposition: A | Discharge status: Home / Self Care | Payer: BC Managed Care – PPO | Source: Ambulatory Visit | Attending: Pulmonary Disease | Admitting: Pulmonary Disease

## 2012-05-02 ENCOUNTER — Encounter (HOSPITAL_COMMUNITY)
Admission: RE | Admit: 2012-05-02 | Discharge: 2012-05-02 | Disposition: A | Discharge status: Home / Self Care | Payer: BC Managed Care – PPO | Source: Ambulatory Visit | Attending: Pulmonary Disease | Admitting: Pulmonary Disease

## 2012-05-04 ENCOUNTER — Encounter (HOSPITAL_COMMUNITY)
Admission: RE | Admit: 2012-05-04 | Discharge: 2012-05-04 | Disposition: A | Discharge status: Home / Self Care | Payer: BC Managed Care – PPO | Source: Ambulatory Visit | Attending: Pulmonary Disease | Admitting: Pulmonary Disease

## 2012-05-08 ENCOUNTER — Other Ambulatory Visit: Payer: Self-pay | Admitting: Obstetrics and Gynecology

## 2012-05-08 DIAGNOSIS — R928 Other abnormal and inconclusive findings on diagnostic imaging of breast: Secondary | ICD-10-CM

## 2012-05-09 ENCOUNTER — Encounter (HOSPITAL_COMMUNITY)
Admission: RE | Admit: 2012-05-09 | Discharge: 2012-05-09 | Disposition: A | Discharge status: Home / Self Care | Payer: BC Managed Care – PPO | Source: Ambulatory Visit | Attending: Pulmonary Disease | Admitting: Pulmonary Disease

## 2012-05-11 ENCOUNTER — Encounter (HOSPITAL_COMMUNITY)
Admission: RE | Admit: 2012-05-11 | Discharge: 2012-05-11 | Disposition: A | Discharge status: Home / Self Care | Payer: BC Managed Care – PPO | Source: Ambulatory Visit | Attending: Pulmonary Disease | Admitting: Pulmonary Disease

## 2012-05-16 ENCOUNTER — Encounter (HOSPITAL_COMMUNITY)
Admission: RE | Admit: 2012-05-16 | Discharge: 2012-05-16 | Disposition: A | Discharge status: Home / Self Care | Payer: BC Managed Care – PPO | Source: Ambulatory Visit | Attending: Pulmonary Disease | Admitting: Pulmonary Disease

## 2012-05-17 ENCOUNTER — Ambulatory Visit
Admission: RE | Admit: 2012-05-17 | Discharge: 2012-05-17 | Disposition: A | Discharge status: Home / Self Care | Payer: BC Managed Care – PPO | Source: Ambulatory Visit | Attending: Obstetrics and Gynecology | Admitting: Obstetrics and Gynecology

## 2012-05-17 DIAGNOSIS — R928 Other abnormal and inconclusive findings on diagnostic imaging of breast: Secondary | ICD-10-CM

## 2012-05-18 ENCOUNTER — Encounter (HOSPITAL_COMMUNITY)
Admission: RE | Admit: 2012-05-18 | Discharge: 2012-05-18 | Disposition: A | Discharge status: Home / Self Care | Payer: BC Managed Care – PPO | Source: Ambulatory Visit | Attending: Pulmonary Disease | Admitting: Pulmonary Disease

## 2012-05-23 ENCOUNTER — Encounter (HOSPITAL_COMMUNITY)
Admission: RE | Admit: 2012-05-23 | Discharge: 2012-05-23 | Disposition: A | Discharge status: Home / Self Care | Payer: BC Managed Care – PPO | Source: Ambulatory Visit | Attending: Internal Medicine | Admitting: Internal Medicine

## 2012-05-23 DIAGNOSIS — D649 Anemia, unspecified: Secondary | ICD-10-CM | POA: Insufficient documentation

## 2012-05-25 ENCOUNTER — Encounter (HOSPITAL_COMMUNITY)
Admission: RE | Admit: 2012-05-25 | Discharge: 2012-05-25 | Disposition: A | Discharge status: Home / Self Care | Payer: BC Managed Care – PPO | Source: Ambulatory Visit | Attending: Pulmonary Disease | Admitting: Pulmonary Disease

## 2012-05-30 ENCOUNTER — Encounter (HOSPITAL_COMMUNITY)
Admission: RE | Admit: 2012-05-30 | Discharge: 2012-05-30 | Disposition: A | Discharge status: Home / Self Care | Payer: BC Managed Care – PPO | Source: Ambulatory Visit | Attending: Pulmonary Disease | Admitting: Pulmonary Disease

## 2012-06-01 ENCOUNTER — Encounter (HOSPITAL_COMMUNITY): Payer: BC Managed Care – PPO

## 2012-06-03 ENCOUNTER — Inpatient Hospital Stay (HOSPITAL_COMMUNITY)
Admission: EM | Admit: 2012-06-03 | Discharge: 2012-06-08 | DRG: 541 | Disposition: A | Discharge status: Home / Self Care | Payer: BC Managed Care – PPO | Attending: Internal Medicine | Admitting: Internal Medicine

## 2012-06-03 ENCOUNTER — Encounter (HOSPITAL_COMMUNITY): Payer: Self-pay | Admitting: *Deleted

## 2012-06-03 ENCOUNTER — Emergency Department (HOSPITAL_COMMUNITY): Payer: BC Managed Care – PPO

## 2012-06-03 DIAGNOSIS — R0902 Hypoxemia: Secondary | ICD-10-CM

## 2012-06-03 DIAGNOSIS — M899 Disorder of bone, unspecified: Secondary | ICD-10-CM

## 2012-06-03 DIAGNOSIS — J984 Other disorders of lung: Secondary | ICD-10-CM | POA: Diagnosis present

## 2012-06-03 DIAGNOSIS — E785 Hyperlipidemia, unspecified: Secondary | ICD-10-CM

## 2012-06-03 DIAGNOSIS — G8929 Other chronic pain: Secondary | ICD-10-CM | POA: Diagnosis present

## 2012-06-03 DIAGNOSIS — I7300 Raynaud's syndrome without gangrene: Secondary | ICD-10-CM

## 2012-06-03 DIAGNOSIS — J189 Pneumonia, unspecified organism: Principal | ICD-10-CM | POA: Diagnosis present

## 2012-06-03 DIAGNOSIS — I10 Essential (primary) hypertension: Secondary | ICD-10-CM

## 2012-06-03 DIAGNOSIS — J479 Bronchiectasis, uncomplicated: Secondary | ICD-10-CM

## 2012-06-03 DIAGNOSIS — Z902 Acquired absence of lung [part of]: Secondary | ICD-10-CM

## 2012-06-03 DIAGNOSIS — J45909 Unspecified asthma, uncomplicated: Secondary | ICD-10-CM

## 2012-06-03 DIAGNOSIS — M949 Disorder of cartilage, unspecified: Secondary | ICD-10-CM | POA: Diagnosis present

## 2012-06-03 DIAGNOSIS — D649 Anemia, unspecified: Secondary | ICD-10-CM

## 2012-06-03 DIAGNOSIS — J9600 Acute respiratory failure, unspecified whether with hypoxia or hypercapnia: Secondary | ICD-10-CM

## 2012-06-03 DIAGNOSIS — M549 Dorsalgia, unspecified: Secondary | ICD-10-CM

## 2012-06-03 LAB — URINALYSIS, ROUTINE W REFLEX MICROSCOPIC
Bilirubin Urine: NEGATIVE
Glucose, UA: NEGATIVE mg/dL
Hgb urine dipstick: NEGATIVE
Ketones, ur: NEGATIVE mg/dL
Leukocytes, UA: NEGATIVE
Nitrite: NEGATIVE
Protein, ur: NEGATIVE mg/dL
Specific Gravity, Urine: 1.01 (ref 1.005–1.030)
Urobilinogen, UA: 0.2 mg/dL (ref 0.0–1.0)
pH: 6.5 (ref 5.0–8.0)

## 2012-06-03 LAB — BASIC METABOLIC PANEL
BUN: 23 mg/dL (ref 6–23)
CO2: 27 mEq/L (ref 19–32)
Calcium: 9 mg/dL (ref 8.4–10.5)
Chloride: 93 mEq/L — ABNORMAL LOW (ref 96–112)
Creatinine, Ser: 0.76 mg/dL (ref 0.50–1.10)
GFR calc Af Amer: >90 mL/min (ref >90)
GFR calc non Af Amer: >90 mL/min (ref >90)
Glucose, Bld: 108 mg/dL — ABNORMAL HIGH (ref 70–99)
Potassium: 4.5 mEq/L (ref 3.5–5.1)
Sodium: 132 mEq/L — ABNORMAL LOW (ref 135–145)

## 2012-06-03 LAB — CBC WITH DIFFERENTIAL/PLATELET
Basophils Absolute: 0 10*3/uL (ref 0.0–0.1)
Basophils Relative: 0 % (ref 0–1)
Eosinophils Absolute: 0 10*3/uL (ref 0.0–0.7)
Eosinophils Relative: 0 % (ref 0–5)
HCT: 35.6 % — ABNORMAL LOW (ref 36.0–46.0)
Hemoglobin: 11.6 g/dL — ABNORMAL LOW (ref 12.0–15.0)
Lymphocytes Relative: 9 % — ABNORMAL LOW (ref 12–46)
Lymphs Abs: 0.7 10*3/uL (ref 0.7–4.0)
MCH: 28.2 pg (ref 26.0–34.0)
MCHC: 32.6 g/dL (ref 30.0–36.0)
MCV: 86.6 fL (ref 78.0–100.0)
Monocytes Absolute: 0.6 10*3/uL (ref 0.1–1.0)
Monocytes Relative: 8 % (ref 3–12)
Neutro Abs: 6.6 10*3/uL (ref 1.7–7.7)
Neutrophils Relative %: 83 % — ABNORMAL HIGH (ref 43–77)
Platelets: 244 10*3/uL (ref 150–400)
RBC: 4.11 MIL/uL (ref 3.87–5.11)
RDW: 16.3 % — ABNORMAL HIGH (ref 11.5–15.5)
WBC: 7.9 10*3/uL (ref 4.0–10.5)

## 2012-06-03 LAB — CG4 I-STAT (LACTIC ACID): Lactic Acid, Venous: 1.83 mmol/L (ref 0.5–2.2)

## 2012-06-03 MED ORDER — OXYCODONE HCL 5 MG PO TABS
5.0000 mg | ORAL_TABLET | Freq: Once | ORAL | Status: AC
  Administered 2012-06-03: 5 mg via ORAL
  Filled 2012-06-03: qty 1

## 2012-06-03 MED ORDER — ALBUTEROL SULFATE (5 MG/ML) 0.5% IN NEBU
5.0000 mg | INHALATION_SOLUTION | Freq: Once | RESPIRATORY_TRACT | Status: AC
  Administered 2012-06-03: 5 mg via RESPIRATORY_TRACT
  Filled 2012-06-03: qty 1

## 2012-06-03 MED ORDER — PIPERACILLIN-TAZOBACTAM 3.375 G IVPB 30 MIN
3.3750 g | Freq: Three times a day (TID) | INTRAVENOUS | Status: DC
  Administered 2012-06-04: 3.375 g via INTRAVENOUS
  Filled 2012-06-03 (×3): qty 50

## 2012-06-03 MED ORDER — SODIUM CHLORIDE 0.9 % IJ SOLN
3.0000 mL | Freq: Two times a day (BID) | INTRAMUSCULAR | Status: DC
  Administered 2012-06-03 – 2012-06-07 (×3): 3 mL via INTRAVENOUS

## 2012-06-03 MED ORDER — ONDANSETRON HCL 4 MG PO TABS: 4.0000 mg | ORAL_TABLET | Freq: Four times a day (QID) | ORAL | Status: DC | PRN

## 2012-06-03 MED ORDER — ENOXAPARIN SODIUM 40 MG/0.4ML ~~LOC~~ SOLN
40.0000 mg | Freq: Every day | SUBCUTANEOUS | Status: DC
  Administered 2012-06-03 – 2012-06-07 (×5): 40 mg via SUBCUTANEOUS
  Filled 2012-06-03 (×6): qty 0.4

## 2012-06-03 MED ORDER — IPRATROPIUM BROMIDE HFA 17 MCG/ACT IN AERS: 2.0000 | INHALATION_SPRAY | Freq: Once | RESPIRATORY_TRACT | Status: DC

## 2012-06-03 MED ORDER — GABAPENTIN 400 MG PO CAPS
400.0000 mg | ORAL_CAPSULE | Freq: Four times a day (QID) | ORAL | Status: DC
  Administered 2012-06-04 – 2012-06-08 (×17): 400 mg via ORAL
  Filled 2012-06-03 (×21): qty 1

## 2012-06-03 MED ORDER — ACETAMINOPHEN 325 MG PO TABS
650.0000 mg | ORAL_TABLET | Freq: Once | ORAL | Status: AC
  Administered 2012-06-03: 650 mg via ORAL
  Filled 2012-06-03: qty 2

## 2012-06-03 MED ORDER — ACETAMINOPHEN 650 MG RE SUPP: 650.0000 mg | Freq: Four times a day (QID) | RECTAL | Status: DC | PRN

## 2012-06-03 MED ORDER — GABAPENTIN 400 MG PO CAPS
400.0000 mg | ORAL_CAPSULE | Freq: Once | ORAL | Status: AC
  Administered 2012-06-03: 400 mg via ORAL
  Filled 2012-06-03: qty 1

## 2012-06-03 MED ORDER — HYDROCODONE-ACETAMINOPHEN 5-325 MG PO TABS
1.0000 | ORAL_TABLET | ORAL | Status: DC | PRN
  Administered 2012-06-04 – 2012-06-06 (×9): 1 via ORAL
  Administered 2012-06-06: 2 via ORAL
  Administered 2012-06-06 (×2): 1 via ORAL
  Administered 2012-06-07 (×2): 2 via ORAL
  Administered 2012-06-07 (×2): 1 via ORAL
  Administered 2012-06-08: 2 via ORAL
  Filled 2012-06-03: qty 2
  Filled 2012-06-03: qty 1
  Filled 2012-06-03: qty 2
  Filled 2012-06-03 (×6): qty 1
  Filled 2012-06-03: qty 2
  Filled 2012-06-03: qty 1
  Filled 2012-06-03: qty 2
  Filled 2012-06-03 (×5): qty 1

## 2012-06-03 MED ORDER — SODIUM CHLORIDE 0.9 % IV SOLN
INTRAVENOUS | Status: DC
  Administered 2012-06-03 – 2012-06-07 (×8): via INTRAVENOUS

## 2012-06-03 MED ORDER — ONDANSETRON HCL 4 MG/2ML IJ SOLN: 4.0000 mg | Freq: Four times a day (QID) | INTRAMUSCULAR | Status: DC | PRN

## 2012-06-03 MED ORDER — ACETAMINOPHEN 325 MG PO TABS
650.0000 mg | ORAL_TABLET | Freq: Four times a day (QID) | ORAL | Status: DC | PRN
  Administered 2012-06-04: 650 mg via ORAL
  Filled 2012-06-03: qty 2

## 2012-06-03 MED ORDER — IPRATROPIUM BROMIDE 0.02 % IN SOLN
0.5000 mg | Freq: Once | RESPIRATORY_TRACT | Status: AC
  Administered 2012-06-03: 0.5 mg via RESPIRATORY_TRACT
  Filled 2012-06-03: qty 2.5

## 2012-06-03 MED ORDER — HYDROMORPHONE HCL PF 1 MG/ML IJ SOLN: 1.0000 mg | INTRAMUSCULAR | Status: DC | PRN

## 2012-06-03 MED ORDER — ALBUTEROL SULFATE (5 MG/ML) 0.5% IN NEBU
2.5000 mg | INHALATION_SOLUTION | RESPIRATORY_TRACT | Status: DC | PRN
  Filled 2012-06-03: qty 0.5

## 2012-06-03 MED ORDER — CALCIUM CARBONATE-VITAMIN D 500-200 MG-UNIT PO TABS
1.0000 | ORAL_TABLET | Freq: Every day | ORAL | Status: DC
  Administered 2012-06-04 – 2012-06-08 (×5): 1 via ORAL
  Filled 2012-06-03 (×5): qty 1

## 2012-06-03 MED ORDER — GUAIFENESIN-DM 100-10 MG/5ML PO SYRP: 5.0000 mL | ORAL_SOLUTION | ORAL | Status: DC | PRN

## 2012-06-03 MED ORDER — PIPERACILLIN-TAZOBACTAM 3.375 G IVPB 30 MIN
3.3750 g | Freq: Once | INTRAVENOUS | Status: AC
  Administered 2012-06-03: 3.375 g via INTRAVENOUS
  Filled 2012-06-03: qty 50

## 2012-06-03 MED ORDER — MOMETASONE FURO-FORMOTEROL FUM 100-5 MCG/ACT IN AERO
2.0000 | INHALATION_SPRAY | Freq: Every day | RESPIRATORY_TRACT | Status: DC
Start: 2012-06-04 — End: 2012-06-05
  Administered 2012-06-04 – 2012-06-05 (×2): 2 via RESPIRATORY_TRACT
  Filled 2012-06-03: qty 8.8

## 2012-06-03 MED ORDER — VITAMIN D (ERGOCALCIFEROL) 1.25 MG (50000 UNIT) PO CAPS
50000.0000 [IU] | ORAL_CAPSULE | ORAL | Status: DC
  Administered 2012-06-04: 50000 [IU] via ORAL
  Filled 2012-06-03: qty 1

## 2012-06-03 MED ORDER — SACCHAROMYCES BOULARDII 250 MG PO CAPS
250.0000 mg | ORAL_CAPSULE | Freq: Two times a day (BID) | ORAL | Status: DC
  Administered 2012-06-03 – 2012-06-08 (×10): 250 mg via ORAL
  Filled 2012-06-03 (×11): qty 1

## 2012-06-03 MED ORDER — DOCUSATE SODIUM 100 MG PO CAPS
100.0000 mg | ORAL_CAPSULE | Freq: Two times a day (BID) | ORAL | Status: DC
  Administered 2012-06-04 – 2012-06-08 (×5): 100 mg via ORAL
  Filled 2012-06-03 (×11): qty 1

## 2012-06-03 MED ORDER — LEVOFLOXACIN IN D5W 750 MG/150ML IV SOLN
750.0000 mg | INTRAVENOUS | Status: DC
  Administered 2012-06-03 – 2012-06-07 (×5): 750 mg via INTRAVENOUS
  Filled 2012-06-03 (×5): qty 150

## 2012-06-03 MED ORDER — SODIUM CHLORIDE 0.9 % IV BOLUS (SEPSIS)
1000.0000 mL | Freq: Once | INTRAVENOUS | Status: AC
  Administered 2012-06-03: 1000 mL via INTRAVENOUS

## 2012-06-03 MED ORDER — ALBUTEROL SULFATE (5 MG/ML) 0.5% IN NEBU
2.5000 mg | INHALATION_SOLUTION | Freq: Four times a day (QID) | RESPIRATORY_TRACT | Status: DC
  Administered 2012-06-04 – 2012-06-08 (×18): 2.5 mg via RESPIRATORY_TRACT
  Filled 2012-06-03 (×19): qty 0.5

## 2012-06-03 MED ORDER — DULOXETINE HCL 60 MG PO CPEP
60.0000 mg | ORAL_CAPSULE | Freq: Every day | ORAL | Status: DC
  Administered 2012-06-03 – 2012-06-07 (×5): 60 mg via ORAL
  Filled 2012-06-03 (×6): qty 1

## 2012-06-03 NOTE — ED Notes (Signed)
Patient transported to X-ray 

## 2012-06-03 NOTE — ED Provider Notes (Signed)
History    61yF with SOB and fever. Onset around Wednesday. Progressively worsening. 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 This has left her with significant restrictive lung disease, as well as some underlying bronchiectasis. Some baseline SOB, but has been worse past 3 days. Followed by Dr Gwenette Greet, pulmonology. Started on azithromycin and is currently on day 3. Chronic pain issues, but denies any acute changes. No unusual leg pain or swelling. Husband recently sick with flu like symptoms.    CSN: [id number]  Arrival date & time 06/03/12  4503   First MD Initiated Contact with Patient 06/03/12 1934      Chief Complaint  Patient presents with  . Shortness of Breath  . Fever    (Consider location/radiation/quality/duration/timing/severity/associated sxs/prior treatment) HPI  Past Medical History  Diagnosis Date  . Hyperlipidemia   . Osteopenia   . C. difficile colitis   . Raynaud's disease   . Restrictive lung disease   . Hyperactive airway disease   . Cancer carcinoid tumor  2004  . Hypertension     Past Surgical History  Procedure Laterality Date  . Broncho pleural fistula  2004  . Pulmonary carcinoid  2004  . Cholecystectomy    . Thoracotomies  2004  . Video bronchoscopy  12/07/2011    Procedure: VIDEO BRONCHOSCOPY WITHOUT FLUORO;  Surgeon: Kathee Delton, MD;  Location: [facility];  Service: Cardiopulmonary;  Laterality: Bilateral;    History reviewed. No pertinent family history.  History  Substance Use Topics  . Smoking status: Never Smoker   . Smokeless tobacco: Never Used  . Alcohol Use: Not on file    OB History   Grav Para Term Preterm Abortions TAB SAB Ect Mult Living                  Review of Systems  All systems reviewed and negative, other than as noted in HPI.   Allergies  Etodolac and Megestrol acetate  Home  Medications   Current Outpatient Rx  Name  Route  Sig  Dispense  Refill  . albuterol (PROVENTIL HFA;VENTOLIN HFA) 108 (90 BASE) MCG/ACT inhaler   Inhalation   Inhale 2 puffs into the lungs every 6 (six) hours as needed. For shortness of breath.         . calcium-vitamin D (OSCAL WITH D) 500-200 MG-UNIT per tablet   Oral   Take 1 tablet by mouth daily.         Marland Kitchen dextromethorphan-guaiFENesin (MUCINEX DM) 30-600 MG per 12 hr tablet   Oral   Take 1 tablet by mouth 2 (two) times daily as needed. For cough/congestion.         . DULoxetine (CYMBALTA) 60 MG capsule   Oral   Take 60 mg by mouth daily.           Marland Kitchen gabapentin (NEURONTIN) 400 MG capsule   Oral   Take 400 mg by mouth 4 (four) times daily.         Marland Kitchen lidocaine (LIDODERM) 5 %   Transdermal   Place 1 patch onto the skin daily. Remove & Discard patch within 12 hours or as directed by MD         . mometasone-formoterol (DULERA) 100-5 MCG/ACT AERO   Inhalation   Inhale 2 puffs into the lungs daily.         . Multiple Vitamin (MULTIVITAMIN WITH MINERALS) TABS  Oral   Take 1 tablet by mouth daily.         Marland Kitchen oxyCODONE (OXY IR/ROXICODONE) 5 MG immediate release tablet   Oral   Take 5 mg by mouth every 6 (six) hours as needed. For pain.         Marland Kitchen saccharomyces boulardii (FLORASTOR) 250 MG capsule   Oral   Take 1 capsule (250 mg total) by mouth 2 (two) times daily.   14 capsule   0   . Vitamin D, Ergocalciferol, (DRISDOL) 50000 UNITS CAPS   Oral   Take 50,000 Units by mouth 3 (three) times a week.           BP 133/49  Pulse 104  Temp(Src) 101.3 F (38.5 C) (Oral)  Resp 22  SpO2 100%  Physical Exam  Nursing note and vitals reviewed. Constitutional: She appears well-developed and well-nourished. No distress.  HENT:  Head: Normocephalic and atraumatic.  Eyes: Conjunctivae are normal. Pupils are equal, round, and reactive to light. Right eye exhibits no discharge. Left eye exhibits no discharge.   Neck: Neck supple.  Cardiovascular: Normal heart sounds.  Exam reveals no gallop and no friction rub.   No murmur heard. Tachycardic with reg rhythm  Pulmonary/Chest: She has wheezes.  tachypnea around 22-24. Faint wheezing, b/l  Abdominal: Soft. She exhibits no distension. There is no tenderness.  Musculoskeletal: She exhibits no edema and no tenderness.  Lower extremities symmetric as compared to each other. No calf tenderness. Negative Homan's. No palpable cords.   Neurological: She is alert.  Skin: Skin is warm and dry.  Psychiatric: She has a normal mood and affect. Her behavior is normal. Thought content normal.    ED Course  Procedures (including critical care time)  Labs Reviewed  CBC WITH DIFFERENTIAL - Abnormal; Notable for the following:    Hemoglobin 11.6 (*)    HCT 35.6 (*)    RDW 16.3 (*)    Neutrophils Relative 83 (*)    Lymphocytes Relative 9 (*)    All other components within normal limits  BASIC METABOLIC PANEL - Abnormal; Notable for the following:    Sodium 132 (*)    Chloride 93 (*)    Glucose, Bld 108 (*)    All other components within normal limits  CULTURE, BLOOD (ROUTINE X 2)  CULTURE, BLOOD (ROUTINE X 2)  URINALYSIS, ROUTINE W REFLEX MICROSCOPIC  CG4 I-STAT (LACTIC ACID)   Dg Chest 2 View  06/03/2012  *RADIOLOGY REPORT*  Clinical Data: 62 year old female with fever, shortness of breath. History of partial left pneumonectomy in 2004 for carcinoid tumor.  CHEST - 2 VIEW  Comparison: 02/14/2012 and earlier.  Findings: Chronic architectural distortion in the left hemithorax and leftward shift of the mediastinum, stable.  Chronic left rib deformities.  Chronic posterior loculated hydropneumothorax on the left with a slightly different configuration than before, but overall similar areas of involvement.  Overall ventilation in the left chest is not changed.  At the same time, patchy right lung reticulonodular opacity has developed.  Right lung volume is  stable.  No right pleural effusion or consolidation. Stable visualized osseous structures.  IMPRESSION: 1.  Suspect acute multilobar infection developing in the right lung. 2.  Essentially stable left chest with extensive chronic architectural distortion and a loculated hydropneumothorax.   Original Report Authenticated By: Roselyn Reef, M.D.    EKG:  Rhythm: sinus tach Rate: 110 Axis: normal sinus LAE ST segments: STE isolated to V2 w/o reciprocal changes  1. CAP (community acquired pneumonia)   2. Hypoxemia   3. Restrictive lung disease       MDM  61yF with dyspnea and fever. CXR consistent with pneumonia. On zithromax day 3. Zosyn added. Discussed with pulmonology. Will admit to medicine.         Virgel Manifold, MD 06/03/12 2131

## 2012-06-03 NOTE — Progress Notes (Signed)
Call Received from ED, pt to be admitted to tele.

## 2012-06-03 NOTE — ED Notes (Signed)
Respiratory contacted to administer breathing treatment.

## 2012-06-03 NOTE — H&P (Signed)
[facility] History and Physical  Deborah [name] LDJ:[id number] DOB: January 08, 1951    PCP:   Jerlyn Ly, MD   Chief Complaint: Shortness of breath and brown sputum productive cough  HPI: Deborah [name] is an 62 y.o. 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 difficile colitis, last hospitalization about 4 months ago for pneumonia, presents to the emergency room with 2 days history of mild chills, brown sputum productive cough, and increase shortness of breath. She is a patient of Dr. Gwenette Greet of the pulmonary service and Dr. Haynes Kerns of [facility] medical. She denied any chest pain, abdominal cramps or pain, lightheadedness, nausea, vomiting, or any other symptomology. Evaluation in emergency room showed no leukocytosis, normal renal function tests, but her chest x-ray show no right upper lobe infiltrate. She maintained hemodynamic stability  while she was in emergency room. She does have slightly low to oximetry at 86% on room air. Hospitalist was asked to admit her for community-acquired pneumonia in the setting of bronchiectasis and prior partial lobectomy.   Rewiew of Systems:  Constitutional: Negative fever and chills. No significant weight loss or weight gain Eyes: Negative for eye pain, redness and discharge, diplopia, visual changes, or flashes of light. ENMT: Negative for ear pain, hoarseness, nasal congestion, sinus pressure and sore throat. No headaches; tinnitus, drooling, or problem swallowing. Cardiovascular: Negative for chest pain, palpitations, diaphoresis,  and peripheral edema. ; No orthopnea, PND Respiratory: Negative for hemoptysis,  and stridor. No pleuritic chestpain. Gastrointestinal: Negative for nausea, vomiting, diarrhea, constipation, abdominal pain, melena, blood in stool, hematemesis, jaundice and rectal bleeding.     Genitourinary: Negative for frequency, dysuria, incontinence,flank pain and hematuria; Musculoskeletal: Negative for back pain and neck pain. Negative for swelling and trauma.;  Skin: . Negative for pruritus, rash, abrasions, bruising and skin lesion.; ulcerations Neuro: Negative for headache, lightheadedness and neck stiffness. Negative for weakness, altered level of consciousness , altered mental status, extremity weakness, burning feet, involuntary movement, seizure and syncope.  Psych: negative for anxiety, depression, insomnia, tearfulness, panic attacks, hallucinations, paranoia, suicidal or homicidal ideation    Past Medical History  Diagnosis Date  . Hyperlipidemia   . Osteopenia   . C. difficile colitis   . Raynaud's disease   . Restrictive lung disease   . Hyperactive airway disease   . Cancer carcinoid tumor  2004  . Hypertension     Past Surgical History  Procedure Laterality Date  . Broncho pleural fistula  2004  . Pulmonary carcinoid  2004  . Cholecystectomy    . Thoracotomies  2004  . Video bronchoscopy  12/07/2011    Procedure: VIDEO BRONCHOSCOPY WITHOUT FLUORO;  Surgeon: Kathee Delton, MD;  Location: [facility];  Service: Cardiopulmonary;  Laterality: Bilateral;    Medications:  HOME MEDS: Prior to Admission medications   Medication Sig Start Date End Date Taking? Authorizing Provider  albuterol (PROVENTIL HFA;VENTOLIN HFA) 108 (90 BASE) MCG/ACT inhaler Inhale 2 puffs into the lungs every 6 (six) hours as needed. For shortness of breath.   Yes Historical Provider, MD  calcium-vitamin D (OSCAL WITH D) 500-200 MG-UNIT per tablet Take 1 tablet by mouth daily.   Yes Historical Provider, MD  dextromethorphan-guaiFENesin (MUCINEX DM) 30-600 MG per 12 hr tablet Take 1 tablet by mouth 2 (two) times daily as needed. For cough/congestion.   Yes Historical Provider, MD  DULoxetine (CYMBALTA) 60 MG capsule Take 60 mg by mouth  every evening.    Yes Historical Provider,  MD  gabapentin (NEURONTIN) 400 MG capsule Take 400 mg by mouth 4 (four) times daily.   Yes Historical Provider, MD  lidocaine (LIDODERM) 5 % Place 1 patch onto the skin daily. Remove & Discard patch within 12 hours or as directed by MD   Yes Historical Provider, MD  mometasone-formoterol (DULERA) 100-5 MCG/ACT AERO Inhale 2 puffs into the lungs daily.   Yes Historical Provider, MD  Multiple Vitamin (MULTIVITAMIN WITH MINERALS) TABS Take 1 tablet by mouth daily.   Yes Historical Provider, MD  oxyCODONE (OXY IR/ROXICODONE) 5 MG immediate release tablet Take 5 mg by mouth every 6 (six) hours as needed. For pain.   Yes Historical Provider, MD  saccharomyces boulardii (FLORASTOR) 250 MG capsule Take 1 capsule (250 mg total) by mouth 2 (two) times daily. 01/05/12  Yes Kathee Delton, MD  Vitamin D, Ergocalciferol, (DRISDOL) 50000 UNITS CAPS Take 50,000 Units by mouth every 7 (seven) days.    Yes Historical Provider, MD  azithromycin (ZITHROMAX) 250 MG tablet Take 500 mg by mouth daily. For 3 days    Historical Provider, MD     Allergies:  Allergies  Allergen Reactions  . Megestrol Acetate     REACTION: rash  . Etodolac Rash    REACTION: rash    Social History:   reports that she has never smoked. She has never used smokeless tobacco. Her alcohol and drug histories are not on file.  Family History: History reviewed. No pertinent family history.   Physical Exam: Filed Vitals:   06/03/12 1927 06/03/12 1958 06/03/12 2014 06/03/12 2029  BP: 137/79   133/49  Pulse: 128   104  Temp: 101.3 F (38.5 C)     TempSrc: Oral     Resp: 28   22  SpO2: 83% 98% 100% 100%   Blood pressure 133/49, pulse 104, temperature 101.3 F (38.5 C), temperature source Oral, resp. rate 22, SpO2 100.00%.  GEN:  Pleasant patient lying in the stretcher in no acute distress; cooperative with exam. PSYCH:  alert and oriented x4; does not appear anxious or depressed; affect is appropriate. HEENT: Mucous membranes  pink and anicteric; PERRLA; EOM intact; no cervical lymphadenopathy nor thyromegaly or carotid bruit; no JVD; There were no stridor. Neck is very supple. Breasts:: Not examined CHEST WALL: No tenderness CHEST: Normal respiration,  she does have bilateral wheezing with scattered rhonchi but no rales.  HEART: Regular rate and rhythm.  There are no murmur, rub, or gallops.   BACK: No kyphosis or scoliosis; no CVA tenderness ABDOMEN: soft and non-tender; no masses, no organomegaly, normal abdominal bowel sounds; no pannus; no intertriginous candida. There is no rebound and no distention. Rectal Exam: Not done EXTREMITIES: No bone or joint deformity; age-appropriate arthropathy of the hands and knees; no edema; no ulcerations.  There is no calf tenderness. Genitalia: not examined PULSES: 2+ and symmetric SKIN: Normal hydration no rash or ulceration. She is slightly clammy. CNS: Cranial nerves 2-12 grossly intact no focal lateralizing neurologic deficit.  Speech is fluent; uvula elevated with phonation, facial symmetry and tongue midline. DTR are normal bilaterally, cerebella exam is intact, barbinski is negative and strengths are equaled bilaterally.  No sensory loss.   Labs on Admission:  Basic Metabolic Panel:  Recent Labs Lab 06/03/12 1950  NA 132*  K 4.5  CL 93*  CO2 27  GLUCOSE 108*  BUN 23  CREATININE 0.76  CALCIUM 9.0   Liver Function  Tests: No results found for this basename: AST, ALT, ALKPHOS, BILITOT, PROT, ALBUMIN,  in the last 168 hours No results found for this basename: LIPASE, AMYLASE,  in the last 168 hours No results found for this basename: AMMONIA,  in the last 168 hours CBC:  Recent Labs Lab 06/03/12 1950  WBC 7.9  NEUTROABS 6.6  HGB 11.6*  HCT 35.6*  MCV 86.6  PLT 244   Cardiac Enzymes: No results found for this basename: CKTOTAL, CKMB, CKMBINDEX, TROPONINI,  in the last 168 hours  CBG: No results found for this basename: GLUCAP,  in the last 168  hours   Radiological Exams on Admission: Dg Chest 2 View  06/03/2012  *RADIOLOGY REPORT*  Clinical Data: 62 year old female with fever, shortness of breath. History of partial left pneumonectomy in 2004 for carcinoid tumor.  CHEST - 2 VIEW  Comparison: 02/14/2012 and earlier.  Findings: Chronic architectural distortion in the left hemithorax and leftward shift of the mediastinum, stable.  Chronic left rib deformities.  Chronic posterior loculated hydropneumothorax on the left with a slightly different configuration than before, but overall similar areas of involvement.  Overall ventilation in the left chest is not changed.  At the same time, patchy right lung reticulonodular opacity has developed.  Right lung volume is stable.  No right pleural effusion or consolidation. Stable visualized osseous structures.  IMPRESSION: 1.  Suspect acute multilobar infection developing in the right lung. 2.  Essentially stable left chest with extensive chronic architectural distortion and a loculated hydropneumothorax.   Original Report Authenticated By: Roselyn Reef, M.D.     Assessment/Plan Present on Admission:  . CAP (community acquired pneumonia) . REACTIVE AIRWAY DISEASE . Restrictive lung disease . Bronchiectasis without acute exacerbation . HYPERTENSION . OSTEOPENIA . HYPERLIPIDEMIA . BACK PAIN, CHRONIC   PLAN:   Will admit her for community-acquired pneumonia. She does have significant wheezing and will receive nebulizer treatments. I would like to hold off on giving her steroid at this point. Because of the bronchiectasis, we will be sure to cover potential pseudomonas. In the past, however, she did not grow a Pseudomonas in her sputum culture. She is also not on any chronic suppressive antibiotic. She will be given Zosyn and Levaquin. She is a little bit hypoxic as well. We will give supplemental oxygen. For her other stable  medical problems, we will continue her home medications. She has history of  C. difficile colitis many years ago, and I will continue her probiotics. Pulmonary was consulted by the ED physician, and will do routine consult tomorrow. She is stable, full code, and will be admitted to telemetry. Thank you for allowing me to participate in the care of your lovely patient.   Other plans as per orders.  Code Status:  full code   Ethel Meisenheimer, MD. [facility] Pager [phone] 7pm to 7am.  06/03/2012, 10:15 PM

## 2012-06-03 NOTE — ED Notes (Signed)
Pt c/o SOb, fever since Wednesday. States "I caught the flu from my husband but now I'm having complications." pt reports "I only have 1/3 of my left lung."

## 2012-06-03 NOTE — ED Notes (Signed)
Floor RN requesting to take report in 64mn. Will call for report at that time.

## 2012-06-04 DIAGNOSIS — J9600 Acute respiratory failure, unspecified whether with hypoxia or hypercapnia: Secondary | ICD-10-CM

## 2012-06-04 DIAGNOSIS — M549 Dorsalgia, unspecified: Secondary | ICD-10-CM

## 2012-06-04 DIAGNOSIS — J189 Pneumonia, unspecified organism: Principal | ICD-10-CM

## 2012-06-04 LAB — EXPECTORATED SPUTUM ASSESSMENT W GRAM STAIN, RFLX TO RESP C

## 2012-06-04 LAB — TSH: TSH: 2.078 u[IU]/mL (ref 0.350–4.500)

## 2012-06-04 MED ORDER — PIPERACILLIN-TAZOBACTAM 3.375 G IVPB
3.3750 g | Freq: Three times a day (TID) | INTRAVENOUS | Status: DC
  Administered 2012-06-04 – 2012-06-08 (×12): 3.375 g via INTRAVENOUS
  Filled 2012-06-04 (×14): qty 50

## 2012-06-04 MED ORDER — GUAIFENESIN ER 600 MG PO TB12
1200.0000 mg | ORAL_TABLET | Freq: Two times a day (BID) | ORAL | Status: DC
  Administered 2012-06-04 – 2012-06-08 (×9): 1200 mg via ORAL
  Filled 2012-06-04 (×10): qty 2

## 2012-06-04 NOTE — Consult Note (Addendum)
PULMONARY  / CRITICAL CARE MEDICINE  Name: RIANNA LUKES MRN: [id number] DOB: 1950/03/11    ADMISSION DATE:  06/03/2012 CONSULTATION DATE:  06/04/12  REFERRING MD :  [facility]: [place]  CHIEF COMPLAINT:  Cough/ fever   BRIEF PATIENT DESCRIPTION:   66 yowf never smoker s/p LLobectomy for carcinoid in 3557 complicated by BP fistula treated at [facility] with persistent small Hydopneumothorax and clinical dx of bronchiectasis/ athma admit 4/12 with 4-5 days increasing sob in setting of fever to 102 with brown sputum and new RUL infiltrate on cxr so PCCM service consulted on 4/13    SIGNIFICANT EVENTS / STUDIES:        CULTURES: BC x 2  4/12 >>>  ANTIBIOTICS: Zosyn 4/12 >>> Levaquin 4/12 >>>    HISTORY OF PRESENT ILLNESS:     Pt had been improving in terms of activity tol at rehab with better breathing maintained on dulera but no 02 but with persistent am yellow mucus and chronic L cw pain ever since T surgery x 3 in 2004 but her husband got "the flu" 5 d prior to her onset 4/9 of sore throat and mucus turning brown with fever to 102 rx as outpt with zpak but worse 4/12 so came to er and admit.  No ha, dental problems, problems with swallowing n or v and breathing comfortable at rest with no myalgias/arthralgias.  Sleeping ok without nocturnal  or early am exacerbation  of respiratory  c/o's or need for noct saba. Also denies any obvious fluctuation of symptoms with weather or environmental changes or other aggravating or alleviating factors except as outlined above   ROS  The following are not active complaints unless bolded sore throat, dysphagia, dental problems, itching, sneezing,  nasal congestion or excess/ purulent secretions, ear ache,   fever, chills, sweats, unintended wt loss, pleuritic or exertional cp, hemoptysis,  orthopnea pnd or leg swelling, presyncope, palpitations, heartburn, abdominal pain, anorexia, nausea, vomiting, diarrhea  or change in bowel or urinary  habits, change in stools or urine, dysuria,hematuria,  rash, arthralgias, visual complaints, headache, numbness weakness or ataxia or problems with walking or coordination,  change in mood/affect or memory.      PAST MEDICAL HISTORY :  Past Medical History  Diagnosis Date  . Hyperlipidemia   . Osteopenia   . C. difficile colitis   . Raynaud's disease   . Restrictive lung disease   . Hyperactive airway disease   . Cancer carcinoid tumor  2004  . Hypertension    Past Surgical History  Procedure Laterality Date  . Broncho pleural fistula  2004  . Pulmonary carcinoid  2004  . Cholecystectomy    . Thoracotomies  2004  . Video bronchoscopy  12/07/2011    Procedure: VIDEO BRONCHOSCOPY WITHOUT FLUORO;  Surgeon: Kathee Delton, MD;  Location: [facility];  Service: Cardiopulmonary;  Laterality: Bilateral;   Prior to Admission medications   Medication Sig Start Date End Date Taking? Authorizing Provider  albuterol (PROVENTIL HFA;VENTOLIN HFA) 108 (90 BASE) MCG/ACT inhaler Inhale 2 puffs into the lungs every 6 (six) hours as needed. For shortness of breath.   Yes Historical Provider, MD  calcium-vitamin D (OSCAL WITH D) 500-200 MG-UNIT per tablet Take 1 tablet by mouth daily.   Yes Historical Provider, MD  dextromethorphan-guaiFENesin (MUCINEX DM) 30-600 MG per 12 hr tablet Take 1 tablet by mouth 2 (two) times daily as needed. For cough/congestion.   Yes Historical Provider, MD  DULoxetine (CYMBALTA) 60 MG capsule  Take 60 mg by mouth every evening.    Yes Historical Provider, MD  gabapentin (NEURONTIN) 400 MG capsule Take 400 mg by mouth 4 (four) times daily.   Yes Historical Provider, MD  lidocaine (LIDODERM) 5 % Place 1 patch onto the skin daily. Remove & Discard patch within 12 hours or as directed by MD   Yes Historical Provider, MD  mometasone-formoterol (DULERA) 100-5 MCG/ACT AERO Inhale 2 puffs into the lungs daily.   Yes Historical Provider, MD  Multiple Vitamin (MULTIVITAMIN WITH  MINERALS) TABS Take 1 tablet by mouth daily.   Yes Historical Provider, MD  oxyCODONE (OXY IR/ROXICODONE) 5 MG immediate release tablet Take 5 mg by mouth every 6 (six) hours as needed. For pain.   Yes Historical Provider, MD  saccharomyces boulardii (FLORASTOR) 250 MG capsule Take 1 capsule (250 mg total) by mouth 2 (two) times daily. 01/05/12  Yes Kathee Delton, MD  Vitamin D, Ergocalciferol, (DRISDOL) 50000 UNITS CAPS Take 50,000 Units by mouth every 7 (seven) days.    Yes Historical Provider, MD  azithromycin (ZITHROMAX) 250 MG tablet Take 500 mg by mouth daily. For 3 days    Historical Provider, MD   Allergies  Allergen Reactions  . Megestrol Acetate     REACTION: rash  . Etodolac Rash    REACTION: rash    FAMILY HISTORY:  History reviewed. No pertinent family history. SOCIAL HISTORY:  reports that she has never smoked. She has never used smokeless tobacco. Her alcohol and drug histories are not on file.     SUBJECTIVE:  Comfortable at rest on nasal 02 but congested sounding cough  VITAL SIGNS: Temp:  [97.9 F (36.6 C)-103.1 F (39.5 C)] 103.1 F (39.5 C) (04/13 0616) Pulse Rate:  [89-128] 114 (04/13 0616) Resp:  [10-28] 20 (04/13 0616) BP: (115-137)/(47-79) 127/58 mmHg (04/13 0616) SpO2:  [83 %-100 %] 94 % (04/13 0740) Weight:  [122 lb 9.2 oz (55.6 kg)] 122 lb 9.2 oz (55.6 kg) (04/12 2257) 02 rx  2lpm np    INTAKE / OUTPUT: Intake/Output     04/12 0701 - 04/13 0700 04/13 0701 - 04/14 0700   P.O.  480   Total Intake(mL/kg)  480 (8.6)   Net   +480          PHYSICAL EXAMINATION: General:thin non toxic appearing wf nad at rest HEENT unrrmarakable Neck without lymphadenopathy or thyromegaly  Chest with crackles and pops in the left lung which are chronic, decreased breath sounds on the left,  A few rhonchi on R s .  Cardiac exam is regular rate and rhythm  Lower extremities without edema, no cyanosis  Alert and oriented, moves all 4  extremities.  Marland Kitchen  LABS:  Recent Labs Lab 06/03/12 1950 06/03/12 2004  HGB 11.6*  --   WBC 7.9  --   PLT 244  --   NA 132*  --   K 4.5  --   CL 93*  --   CO2 27  --   GLUCOSE 108*  --   BUN 23  --   CREATININE 0.76  --   CALCIUM 9.0  --   LATICACIDVEN  --  1.83   No results found for this basename: GLUCAP,  in the last 168 hours  CXR 06/03/12 Findings: Chronic architectural distortion in the left hemithorax  and leftward shift of the mediastinum, stable. Chronic left rib  deformities. Chronic posterior loculated hydropneumothorax on the  left with a slightly different configuration than  before, but  overall similar areas of involvement. Overall ventilation in the  left chest is not changed.  At the same time, patchy right lung reticulonodular opacity has  developed. Right lung volume is stable. No right pleural effusion  or consolidation. Stable visualized osseous structures.      ASSESSMENT / PLAN:  1)  CAP wit hypoxemic resp failure in pt with probable chronic BP fistula = bronchiectasis-like - Agree with broad abx as they are, cover everything but MRSA which seems unlikley - Titrate 02 to sat > 90% and note has not previously required chronic 02 -Added flutter 4/13 plus mucinex - Can't tol vest due to cp - Will ask Dr Gwenette Greet to review care 4/14     Christinia Gully, MD [facility] [place] [phone] After 5:30 PM or weekends, call [phone]   .

## 2012-06-04 NOTE — Progress Notes (Signed)
Utilization Review Completed.Donne Anon T4/13/2014

## 2012-06-04 NOTE — Progress Notes (Signed)
Temp of 103.1. 650 tylenol given. Will monitor.

## 2012-06-04 NOTE — Progress Notes (Signed)
[facility] PROGRESS NOTE  Deborah [name] HQI:[id number] DOB: 1950/11/06 DOA: 06/03/2012 PCP: Jerlyn Ly, MD  Brief Narrative: Deborah [name] is an 62 y.o. 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 difficile colitis, last hospitalization about 4 months ago for pneumonia, presents to the emergency room with 2 days history of mild chills, brown sputum productive cough, and increase shortness of breath. She is a patient of Dr. Gwenette Greet of the pulmonary service and Dr. Haynes Kerns of [facility] medical. She denied any chest pain, abdominal cramps or pain, lightheadedness, nausea, vomiting, or any other symptomology. Evaluation in emergency room showed no leukocytosis, normal renal function tests, but her chest x-ray show no right upper lobe infiltrate. She maintained hemodynamic stability while she was in emergency room. She does have slightly low to oximetry at 86% on room air. Hospitalist was asked to admit her for community-acquired pneumonia in the setting of bronchiectasis and prior partial lobectomy.   Assessment/Plan:  CAP - continue Zosyn and Levaquin - no history of Pseudomonas - still febrile this morning; sputum cultures ordered and pending.   Back pain - continue home medications  Reactive airway disease - Pulm to see pt  DVT prophylaxis - Lovenox  Code Status: Full Family Communication: none  Disposition Plan: remain inpatient  Consultants:  Pulmonary  Procedures:  none  Antibiotics:  Zosyn 4/13 >>  Levofloxacin 4/13 >>  HPI/Subjective: - feels feverish this morning  Objective: Filed Vitals:   06/03/12 2257 06/03/12 2316 06/04/12 0616 06/04/12 0740  BP:  120/54 127/58   Pulse:  89 114   Temp:  97.9 F (36.6 C) 103.1 F (39.5 C)   TempSrc:  Oral Oral   Resp:  22 20   Height: 5' 4.96" (1.65 m)     Weight:  55.6 kg (122 lb 9.2 oz)     SpO2:  95% 92% 94%   No intake or output data in the 24 hours ending 06/04/12 0828 Filed Weights   06/03/12 2257  Weight: 55.6 kg (122 lb 9.2 oz)   Exam:  General:  NAD  Cardiovascular: regular rate and rhythm, without MRG  Respiratory: good air movement, scant wheezing  Abdomen: soft, not tender to palpation, positive bowel sounds  MSK: no peripheral edema  Neuro: CN 2-12 grossly intact, MS 5/5 in all 4  Data Reviewed: Basic Metabolic Panel:  Recent Labs Lab 06/03/12 1950  NA 132*  K 4.5  CL 93*  CO2 27  GLUCOSE 108*  BUN 23  CREATININE 0.76  CALCIUM 9.0   CBC:  Recent Labs Lab 06/03/12 1950  WBC 7.9  NEUTROABS 6.6  HGB 11.6*  HCT 35.6*  MCV 86.6  PLT 244   Studies: Dg Chest 2 View  06/03/2012  *RADIOLOGY REPORT*  Clinical Data: 62 year old female with fever, shortness of breath. History of partial left pneumonectomy in 2004 for carcinoid tumor.  CHEST - 2 VIEW  Comparison: 02/14/2012 and earlier.  Findings: Chronic architectural distortion in the left hemithorax and leftward shift of the mediastinum, stable.  Chronic left rib deformities.  Chronic posterior loculated hydropneumothorax on the left with a slightly different configuration than before, but overall similar areas of involvement.  Overall ventilation in the left chest is not changed.  At the same time, patchy right lung reticulonodular opacity has developed.  Right lung volume is stable.  No right pleural effusion or consolidation. Stable visualized osseous structures.  IMPRESSION:  1.  Suspect acute multilobar infection developing in the right lung. 2.  Essentially stable left chest with extensive chronic architectural distortion and a loculated hydropneumothorax.   Original Report Authenticated By: Roselyn Reef, M.D.     Scheduled Meds: . albuterol  2.5 mg Nebulization Q6H  . calcium-vitamin D  1 tablet Oral Daily  . docusate sodium  100 mg Oral BID  . DULoxetine  60 mg  Oral QHS  . enoxaparin (LOVENOX) injection  40 mg Subcutaneous QHS  . gabapentin  400 mg Oral QID  . levofloxacin (LEVAQUIN) IV  750 mg Intravenous Q24H  . mometasone-formoterol  2 puff Inhalation Daily  . piperacillin-tazobactam  3.375 g Intravenous Q8H  . saccharomyces boulardii  250 mg Oral BID  . sodium chloride  3 mL Intravenous Q12H  . Vitamin D (Ergocalciferol)  50,000 Units Oral Q7 days   Continuous Infusions: . sodium chloride 150 mL/hr at 06/03/12 2255    Principal Problem:   CAP (community acquired pneumonia) Active Problems:   HYPERLIPIDEMIA   HYPERTENSION   REACTIVE AIRWAY DISEASE   BACK PAIN, CHRONIC   OSTEOPENIA   Restrictive lung disease   Bronchiectasis without acute exacerbation   Time spent: [place], MD [facility] Pager [phone]. If 7 PM - 7 AM, please contact night-coverage at [email or web address], password [facility] 06/04/2012, 8:28 AM  LOS: 1 day

## 2012-06-05 ENCOUNTER — Inpatient Hospital Stay (HOSPITAL_COMMUNITY): Payer: BC Managed Care – PPO

## 2012-06-05 DIAGNOSIS — J984 Other disorders of lung: Secondary | ICD-10-CM

## 2012-06-05 MED ORDER — MOMETASONE FURO-FORMOTEROL FUM 100-5 MCG/ACT IN AERO
2.0000 | INHALATION_SPRAY | Freq: Two times a day (BID) | RESPIRATORY_TRACT | Status: DC
  Administered 2012-06-05 – 2012-06-08 (×6): 2 via RESPIRATORY_TRACT
  Filled 2012-06-05: qty 8.8

## 2012-06-05 MED ORDER — MOMETASONE FURO-FORMOTEROL FUM 100-5 MCG/ACT IN AERO: 2.0000 | INHALATION_SPRAY | Freq: Two times a day (BID) | RESPIRATORY_TRACT | Status: DC

## 2012-06-05 NOTE — Consult Note (Signed)
PULMONARY  / CRITICAL CARE MEDICINE  Name: Deborah [name] MRN: [id number] DOB: 1950-09-23    ADMISSION DATE:  06/03/2012 CONSULTATION DATE:  06/04/12  REFERRING MD :  [facility] PR[facility] SERVICE: [place]  CHIEF COMPLAINT:  Cough/ fever   BRIEF PATIENT DESCRIPTION:   66 yowf never smoker s/p LLobectomy for carcinoid in 0488 complicated by BP fistula treated at [facility] with persistent small Hydopneumothorax and clinical dx of bronchiectasis/ athma admit 4/12 with 4-5 days increasing sob in setting of fever to 102 with brown sputum and new RUL infiltrate on cxr so PCCM service consulted on 4/13.  Husband with ? viral illness prior to her sickening.     SIGNIFICANT EVENTS / STUDIES:     CULTURES: BC x 2  4/12 >>> Sputum 4/13>>>  ANTIBIOTICS: Zosyn 4/12 >>> Levaquin 4/12 >>>    SUBJECTIVE:  Pt reports episode of dyspnea overnight while lying flat.  Felt as though she had significant secretions to cough up but could not on her own.  RN performed chest percussion and pt was able to liberate some rusty colored secretions.  Fever reduced - tmax 103 4/13 0600  VITAL SIGNS: Temp:  [97.7 F (36.5 C)-97.9 F (36.6 C)] 97.8 F (36.6 C) (04/14 0625) Pulse Rate:  [89-101] 89 (04/14 0625) Resp:  [18-20] 20 (04/14 0625) BP: (105-132)/(50-69) 131/69 mmHg (04/14 0625) SpO2:  [92 %-100 %] 100 % (04/14 0733) O2 2L/[place]  INTAKE / OUTPUT: Intake/Output     04/13 0701 - 04/14 0700 04/14 0701 - 04/15 0700   P.O. 960    I.V. (mL/kg) 4812.5 (86.6)    IV Piggyback 450    Total Intake(mL/kg) 6222.5 (111.9)    Urine (mL/kg/hr) 2150 (1.6) 600 (3.9)   Total Output 2150 600   Net +4072.5 -600        Urine Occurrence 3 x    Stool Occurrence 1 x      PHYSICAL EXAMINATION: General: thin non toxic appearing wf nad at rest HEENT unrrmarakable Neck without lymphadenopathy or thyromegaly  Chest: resp's even/non-labored, lungs posterior lower with coarse rhonchi, crackles / pops Cardiac: s1s2 regular rate  and rhythm  Lower extremities without edema, no cyanosis  Alert and oriented, moves all 4 extremities.   LABS:  Recent Labs Lab 06/03/12 1950 06/03/12 2004  HGB 11.6*  --   WBC 7.9  --   PLT 244  --   NA 132*  --   K 4.5  --   CL 93*  --   CO2 27  --   GLUCOSE 108*  --   BUN 23  --   CREATININE 0.76  --   CALCIUM 9.0  --   LATICACIDVEN  --  1.83   No results found for this basename: GLUCAP,  in the last 168 hours  CXR:  4/12 -   Suspect acute multilobar infection developing in the right lung. Essentially stable left chest with extensive chronic architectural distortion and a loculated hydropneumothorax.   ASSESSMENT / PLAN:  CAP  Hypoxemic resp failure  Probable chronic BP fistula = bronchiectasis-like  Plan: - Agree with broad abx as they are, cover everything but MRSA which seems unlikley.  ? If she will need long term abx / PICC - Titrate 02 to sat > 90% and note has not previously required chronic 02 - Continue flutter 4/13 plus mucinex - add mannuel chest percussion - continue BD's, guaifenesin, dulera - f/u cxr in am 2/15   Attending to Follow.  Velna Hatchet  [facility] [place] [facility] Pgr: [phone] or [phone]    .Christinia Gully, MD Pulmonary and [place] [phone] After 5:30 PM or weekends, call [phone]

## 2012-06-05 NOTE — Progress Notes (Signed)
[facility] PROGRESS NOTE  Deborah [name] DOB: [birth year] DOA: 06/03/2012 PCP: Jerlyn Ly, MD  Brief Narrative: Deborah [name] is an 62 y.o. 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 difficile colitis, last hospitalization about 4 months ago for pneumonia, presents to the emergency room with 2 days history of mild chills, brown sputum productive cough, and increase shortness of breath. She is a patient of Dr. Gwenette Greet of the pulmonary service and Dr. Haynes Kerns of [facility] medical. She denied any chest pain, abdominal cramps or pain, lightheadedness, nausea, vomiting, or any other symptomology. Evaluation in emergency room showed no leukocytosis, normal renal function tests, but her chest x-ray show no right upper lobe infiltrate. She maintained hemodynamic stability while she was in emergency room. She does have slightly low to oximetry at 86% on room air. Hospitalist was asked to admit her for community-acquired pneumonia in the setting of bronchiectasis and prior partial lobectomy.   Assessment/Plan:  CAP - continue Zosyn and Levaquin - afebrile this morning - pulm following  Back pain - continue home medications  Reactive airway disease - improving  DVT prophylaxis - Lovenox  Code Status: Full Family Communication: none  Disposition Plan: remain inpatient  Consultants:  Pulmonary  Procedures:  none  Antibiotics:  Zosyn 4/13 >>  Levofloxacin 4/13 >>  HPI/Subjective: - had one episode of shortness of breath ~ 7 am, resolved  Objective: Filed Vitals:   06/05/12 0625 06/05/12 0700 06/05/12 0733 06/05/12 1300  BP: 131/69   164/67  Pulse: 89   96  Temp: 97.8 F (36.6 C)   97.8 F (36.6 C)  TempSrc: Oral   Oral  Resp: 20   19  Height:      Weight:      SpO2: 97% 100% 100% 98%    Intake/Output  Summary (Last 24 hours) at 06/05/12 1450 Last data filed at 06/05/12 1342  Gross per 24 hour  Intake 5982.5 ml  Output   4350 ml  Net 1632.5 ml   Filed Weights   06/03/12 2257  Weight: 55.6 kg (122 lb 9.2 oz)   Exam:  General:  NAD  Cardiovascular: regular rate and rhythm, without MRG  Respiratory: good air movement, coarse breath sounds, wheezing present   Abdomen: soft, not tender to palpation, positive bowel sounds  MSK: no peripheral edema  Neuro: CN 2-12 grossly intact, MS 5/5 in all 4  Data Reviewed: Basic Metabolic Panel:  Recent Labs Lab 06/03/12 1950  NA 132*  K 4.5  CL 93*  CO2 27  GLUCOSE 108*  BUN 23  CREATININE 0.76  CALCIUM 9.0   CBC:  Recent Labs Lab 06/03/12 1950  WBC 7.9  NEUTROABS 6.6  HGB 11.6*  HCT 35.6*  MCV 86.6  PLT 244   Studies: Dg Chest 2 View  06/05/2012  *RADIOLOGY REPORT*  Clinical Data: Evaluate airspace disease on the right.  CHEST - 2 VIEW  Comparison: 06/03/2012  Findings: Progression of airspace disease on the right with diffuse infiltrate now present.  Increase in small right pleural effusion.  Postop thoracotomy and lobectomy on the left with pleural thickening and chronic hydropneumothorax on the left, unchanged. Left lower lobe consolidation and left apical pleural thickening are unchanged.  IMPRESSION: Progression of airspace disease throughout the right lung. Progression of right pleural effusion.  This may be due to diffuse pneumonia or pulmonary edema.   Original Report Authenticated  By: Carl Best, M.D.    Dg Chest 2 View  06/03/2012  *RADIOLOGY REPORT*  Clinical Data: 62 year old female with fever, shortness of breath. History of partial left pneumonectomy in 2004 for carcinoid tumor.  CHEST - 2 VIEW  Comparison: 02/14/2012 and earlier.  Findings: Chronic architectural distortion in the left hemithorax and leftward shift of the mediastinum, stable.  Chronic left rib deformities.  Chronic posterior loculated  hydropneumothorax on the left with a slightly different configuration than before, but overall similar areas of involvement.  Overall ventilation in the left chest is not changed.  At the same time, patchy right lung reticulonodular opacity has developed.  Right lung volume is stable.  No right pleural effusion or consolidation. Stable visualized osseous structures.  IMPRESSION: 1.  Suspect acute multilobar infection developing in the right lung. 2.  Essentially stable left chest with extensive chronic architectural distortion and a loculated hydropneumothorax.   Original Report Authenticated By: Roselyn Reef, M.D.     Scheduled Meds: . albuterol  2.5 mg Nebulization Q6H  . calcium-vitamin D  1 tablet Oral Daily  . docusate sodium  100 mg Oral BID  . DULoxetine  60 mg Oral QHS  . enoxaparin (LOVENOX) injection  40 mg Subcutaneous QHS  . gabapentin  400 mg Oral QID  . guaiFENesin  1,200 mg Oral BID  . levofloxacin (LEVAQUIN) IV  750 mg Intravenous Q24H  . mometasone-formoterol  2 puff Inhalation Daily  . piperacillin-tazobactam (ZOSYN)  IV  3.375 g Intravenous Q8H  . saccharomyces boulardii  250 mg Oral BID  . sodium chloride  3 mL Intravenous Q12H  . Vitamin D (Ergocalciferol)  50,000 Units Oral Q7 days   Continuous Infusions: . sodium chloride 150 mL/hr at 06/05/12 1009    Principal Problem:   CAP (community acquired pneumonia) Active Problems:   HYPERLIPIDEMIA   HYPERTENSION   REACTIVE AIRWAY DISEASE   BACK PAIN, CHRONIC   OSTEOPENIA   Restrictive lung disease   Bronchiectasis without acute exacerbation   Acute respiratory failure  Time spent: [place], MD [facility] Pager [phone]. If 7 PM - 7 AM, please contact night-coverage at [email or web address], password [facility] 06/05/2012, 2:50 PM  LOS: 2 days

## 2012-06-05 NOTE — Progress Notes (Signed)
PULMONARY  / CRITICAL CARE MEDICINE  Name: Deborah [name] MRN: [id number] DOB: 05-May-1950    ADMISSION DATE:  06/03/2012 CONSULTATION DATE:  06/04/12  REFERRING MD :  [facility] PRIMARY SERVICE: [place]  CHIEF COMPLAINT:  Cough/ fever   BRIEF PATIENT DESCRIPTION:   44 yowf never smoker s/p LLobectomy for carcinoid in 1761 complicated by BP fistula treated at [facility] with persistent small Hydopneumothorax and clinical dx of bronchiectasis/ athma admit 4/12 with 4-5 days increasing sob in setting of fever to 102 with brown sputum and new RUL infiltrate on cxr so PCCM service consulted on 4/13.  Husband with ? viral illness 5 d prior to her acute illness   SIGNIFICANT EVENTS / STUDIES:     CULTURES: BC x 2  4/12 >>> Sputum 4/13>>>  ANTIBIOTICS: Zosyn 4/12 >>> Levaquin 4/12 >>>    SUBJECTIVE:  Pt reports episode of dyspnea overnight while lying flat.  Felt as though she had significant secretions to cough up but could not on her own.  RN performed chest percussion and pt was able to liberate some rusty colored secretions.  Fever reduced - tmax 103 4/13 0600  VITAL SIGNS: Temp:  [97.8 F (36.6 C)] 97.8 F (36.6 C) (04/14 1300) Pulse Rate:  [89-96] 96 (04/14 1300) Resp:  [19-20] 19 (04/14 1300) BP: (127-164)/(53-69) 164/67 mmHg (04/14 1300) SpO2:  [97 %-100 %] 98 % (04/14 1300) O2 2L/[place]  INTAKE / OUTPUT: Intake/Output     04/13 0701 - 04/14 0700 04/14 0701 - 04/15 0700   P.O. 960 480   I.V. (mL/kg) 4812.5 (86.6)    IV Piggyback 450    Total Intake(mL/kg) 6222.5 (111.9) 480 (8.6)   Urine (mL/kg/hr) 2150 (1.6) 2200 (4.9)   Total Output 2150 2200   Net +4072.5 -1720        Urine Occurrence 3 x    Stool Occurrence 1 x 2 x     PHYSICAL EXAMINATION: General: thin non toxic appearing wf nad at rest HEENT unrrmarakable Neck without lymphadenopathy or thyromegaly  Chest: resp's even/non-labored, lungs posterior lower with coarse rhonchi, crackles / pops Cardiac: s1s2 regular  rate and rhythm  Lower extremities without edema, no cyanosis  Alert and oriented, moves all 4 extremities.   LABS:  Recent Labs Lab 06/03/12 1950 06/03/12 2004  HGB 11.6*  --   WBC 7.9  --   PLT 244  --   NA 132*  --   K 4.5  --   CL 93*  --   CO2 27  --   GLUCOSE 108*  --   BUN 23  --   CREATININE 0.76  --   CALCIUM 9.0  --   LATICACIDVEN  --  1.83   No results found for this basename: GLUCAP,  in the last 168 hours  CXR:  4/12 -   Suspect acute multilobar infection developing in the right lung. Essentially stable left chest with extensive chronic architectural distortion and a loculated hydropneumothorax.   ASSESSMENT / PLAN:  CAP  Hypoxemic resp failure  Probable chronic BP fistula = bronchiectasis-like  Plan: - Agree with broad abx as they are, cover everything but MRSA which seems unlikley.  ? If she will need long term abx / PICC - Titrate 02 to sat > 90% and note has not previously required chronic 02 - Continue flutter 4/13 plus mucinex - add manual chest percussion - continue BD's, guaifenesin, dulera - f/u cxr in am 2/15   Noe Gens, NP-C [place] [facility] Pgr: [phone] or [phone]  Pt seen and examined, fever trend down good sign. Agree with a/p  .Christinia Gully, MD Pulmonary and [place] [phone] After 5:30 PM or weekends, call [phone]

## 2012-06-06 ENCOUNTER — Encounter (HOSPITAL_COMMUNITY): Payer: BC Managed Care – PPO

## 2012-06-06 LAB — BASIC METABOLIC PANEL
Calcium: 8.8 mg/dL (ref 8.4–10.5)
GFR calc non Af Amer: >90 mL/min (ref >90)
Sodium: 141 mEq/L (ref 135–145)

## 2012-06-06 LAB — CBC
MCH: 27.7 pg (ref 26.0–34.0)
MCHC: 32.2 g/dL (ref 30.0–36.0)
Platelets: 173 10*3/uL (ref 150–400)
RDW: 16.5 % — ABNORMAL HIGH (ref 11.5–15.5)

## 2012-06-06 MED ORDER — LIDOCAINE 5 % EX PTCH
1.0000 | MEDICATED_PATCH | CUTANEOUS | Status: DC
  Administered 2012-06-06 – 2012-06-08 (×3): 1 via TRANSDERMAL
  Filled 2012-06-06 (×4): qty 1

## 2012-06-06 NOTE — Progress Notes (Signed)
[facility] PROGRESS NOTE  Deborah MASSETT WLK:[id number] DOB: 04/19/1950 DOA: 06/03/2012 PCP: Jerlyn Ly, MD  Brief Narrative: Deborah [name] is an 62 y.o. 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 difficile colitis, last hospitalization about 4 months ago for pneumonia, presents to the emergency room with 2 days history of mild chills, brown sputum productive cough, and increase shortness of breath. She is a patient of Dr. Gwenette Greet of the pulmonary service and Dr. Haynes Kerns of [facility] medical. She denied any chest pain, abdominal cramps or pain, lightheadedness, nausea, vomiting, or any other symptomology. Evaluation in emergency room showed no leukocytosis, normal renal function tests, but her chest x-ray show no right upper lobe infiltrate. She maintained hemodynamic stability while she was in emergency room. She does have slightly low to oximetry at 86% on room air. Hospitalist was asked to admit her for community-acquired pneumonia in the setting of bronchiectasis and prior partial lobectomy.   Assessment/Plan:  CAP - continue Zosyn and Levaquin - afebrile this morning - pulm following, Dr. Gwenette Greet will see patient today - if Dr. Gwenette Greet agrees, may try to use po Levaquin and stop iv Zosyn and monitor clinical status.   Back pain/left sided chest wall pain - continue home medications - lidocaine patch today  Reactive airway disease - improving  DVT prophylaxis - Lovenox  Code Status: Full Family Communication: none  Disposition Plan: remain inpatient  Consultants:  Pulmonary  Procedures:  none  Antibiotics:  Zosyn 4/13 >>  Levofloxacin 4/13 >>  HPI/Subjective: - feeling better, left sided chest wall pain present with cough and prevents use of vest  Objective: Filed Vitals:   06/05/12 2150 06/05/12 2332 06/06/12  0154 06/06/12 0412  BP: 147/82   121/62  Pulse: 92   88  Temp: 97.6 F (36.4 C)   97.7 F (36.5 C)  TempSrc: Oral   Oral  Resp: _0 Height:      Weight:      SpO2: 98%   100%    Intake/Output Summary (Last 24 hours) at 06/06/12 0748 Last data filed at 06/06/12 7096  Gross per 24 hour  Intake 4312.5 ml  Output   6400 ml  Net -2087.5 ml   Filed Weights   06/03/12 2257  Weight: 55.6 kg (122 lb 9.2 oz)   Exam:  General:  NAD  Cardiovascular: regular rate and rhythm, without MRG  Respiratory: good air movement, wheezing improved  Abdomen: soft, not tender to palpation, positive bowel sounds  MSK: no peripheral edema  Neuro: CN 2-12 grossly intact, MS 5/5 in all 4  Data Reviewed: Basic Metabolic Panel:  Recent Labs Lab 06/03/12 1950 06/06/12 0515  NA 132* 141  K 4.5 4.1  CL 93* 104  CO2 27 29  GLUCOSE 108* 117*  BUN 23 8  CREATININE 0.76 0.65  CALCIUM 9.0 8.8   CBC:  Recent Labs Lab 06/03/12 1950 06/06/12 0515  WBC 7.9 5.7  NEUTROABS 6.6  --   HGB 11.6* 9.4*  HCT 35.6* 29.2*  MCV 86.6 86.1  PLT 244 173   Studies: Dg Chest 2 View  06/05/2012  *RADIOLOGY REPORT*  Clinical Data: Evaluate airspace disease on the right.  CHEST - 2 VIEW  Comparison: 06/03/2012  Findings: Progression of airspace disease on the right with diffuse infiltrate now present.  Increase in small right pleural effusion.  Postop thoracotomy and lobectomy on  the left with pleural thickening and chronic hydropneumothorax on the left, unchanged. Left lower lobe consolidation and left apical pleural thickening are unchanged.  IMPRESSION: Progression of airspace disease throughout the right lung. Progression of right pleural effusion.  This may be due to diffuse pneumonia or pulmonary edema.   Original Report Authenticated By: Carl Best, M.D.     Scheduled Meds: . albuterol  2.5 mg Nebulization Q6H  . calcium-vitamin D  1 tablet Oral Daily  . docusate sodium  100 mg Oral BID   . DULoxetine  60 mg Oral QHS  . enoxaparin (LOVENOX) injection  40 mg Subcutaneous QHS  . gabapentin  400 mg Oral QID  . guaiFENesin  1,200 mg Oral BID  . levofloxacin (LEVAQUIN) IV  750 mg Intravenous Q24H  . mometasone-formoterol  2 puff Inhalation BID  . piperacillin-tazobactam (ZOSYN)  IV  3.375 g Intravenous Q8H  . saccharomyces boulardii  250 mg Oral BID  . sodium chloride  3 mL Intravenous Q12H  . Vitamin D (Ergocalciferol)  50,000 Units Oral Q7 days   Continuous Infusions: . sodium chloride 150 mL/hr at 06/05/12 1651    Principal Problem:   CAP (community acquired pneumonia) Active Problems:   HYPERLIPIDEMIA   HYPERTENSION   REACTIVE AIRWAY DISEASE   BACK PAIN, CHRONIC   OSTEOPENIA   Restrictive lung disease   Bronchiectasis without acute exacerbation   Acute respiratory failure  Time spent: [place], MD [facility] Pager [phone]. If 7 PM - 7 AM, please contact night-coverage at [email or web address], password [facility] 06/06/2012, 7:48 AM  LOS: 3 days

## 2012-06-06 NOTE — Progress Notes (Signed)
Subjective: Feels much improved.  She is better than her cxr looks.  No new fever.  Cultures still pending.   Objective: Vital signs in last 24 hours: Blood pressure 121/62, pulse 88, temperature 97.7 F (36.5 C), temperature source Oral, resp. rate 16, height 5' 4.96" (1.65 m), weight 55.6 kg (122 lb 9.2 oz), SpO2 98.00%.  Intake/Output from previous day: 04/14 0701 - 04/15 0700 In: 4312.5 [P.O.:480; I.V.:3532.5; IV Piggyback:300] Out: 7200 [Urine:7200]   Physical Exam:   frail appearing female in nad Nose without purulence or discharge noted. Neck without LN or TMG Chest with crackles and pops on left (chronic), crackles on right (new) Cor with mild tachy but regular. LE without edema, no cyanosis Alert and oriented, moves all 4.    Lab Results:  Recent Labs  06/03/12 1950 06/06/12 0515  WBC 7.9 5.7  HGB 11.6* 9.4*  HCT 35.6* 29.2*  PLT 244 173   BMET  Recent Labs  06/03/12 1950 06/06/12 0515  NA 132* 141  K 4.5 4.1  CL 93* 104  CO2 27 29  GLUCOSE 108* 117*  BUN 23 8  CREATININE 0.76 0.65  CALCIUM 9.0 8.8    Studies/Results: Dg Chest 2 View  06/05/2012  *RADIOLOGY REPORT*  Clinical Data: Evaluate airspace disease on the right.  CHEST - 2 VIEW  Comparison: 06/03/2012  Findings: Progression of airspace disease on the right with diffuse infiltrate now present.  Increase in small right pleural effusion.  Postop thoracotomy and lobectomy on the left with pleural thickening and chronic hydropneumothorax on the left, unchanged. Left lower lobe consolidation and left apical pleural thickening are unchanged.  IMPRESSION: Progression of airspace disease throughout the right lung. Progression of right pleural effusion.  This may be due to diffuse pneumonia or pulmonary edema.   Original Report Authenticated By: Carl Best, M.D.     Assessment/Plan:  1) Community acquired pna in pt with severe left lung destruction with bronchiectasis and restrictive lung dz. She has  been having more frequent pulmonary infections, and I wonder if she is getting "spillage" from contaminated left lung or ?aspirating at night while lying down?  Will also check serum Ig's for completeness.  Much improved on abx.  Would not worry about current cxr since she is greatly improved clinically.   -continue IV abx for now, but will make a decision about home IV abx vs PO once cultures are finalized. -continue pulmonary toilet -check serum Ig's     Kathee Delton, M.D. 06/06/2012, 1:42 PM

## 2012-06-07 DIAGNOSIS — J45909 Unspecified asthma, uncomplicated: Secondary | ICD-10-CM

## 2012-06-07 LAB — CULTURE, RESPIRATORY W GRAM STAIN: Culture: NORMAL

## 2012-06-07 LAB — IGG, IGA, IGM: IgG (Immunoglobin G), Serum: 1200 mg/dL (ref 690–1700)

## 2012-06-07 LAB — CBC
MCH: 27.7 pg (ref 26.0–34.0)
MCV: 85.8 fL (ref 78.0–100.0)
Platelets: 179 10*3/uL (ref 150–400)
RDW: 16.3 % — ABNORMAL HIGH (ref 11.5–15.5)
WBC: 4.1 10*3/uL (ref 4.0–10.5)

## 2012-06-07 MED ORDER — VITAMINS A & D EX OINT
TOPICAL_OINTMENT | CUTANEOUS | Status: AC
  Administered 2012-06-07: 08:00:00
  Filled 2012-06-07: qty 5

## 2012-06-07 NOTE — Progress Notes (Signed)
Patient ID: Deborah [name], female   DOB: 1950/07/04, 62 y.o.   MRN: [id number] [facility] PROGRESS NOTE  ICA DAYE KMM:[id number] DOB: 1951/02/04 DOA: 06/03/2012 PCP: Jerlyn Ly, MD  Brief Narrative:  Pt is 62 y.o. 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 difficile colitis, last hospitalization about 4 months ago for pneumonia, presented to the emergency room with 2 days history of mild chills, brown sputum productive cough, and increase in shortness of breath. She is a patient of Dr. Gwenette Greet of the pulmonary service and Dr. Joylene Draft of [facility] medical. She denied any chest pain, abdominal cramps or pain, lightheadedness, nausea, vomiting, or any other symptomology. Evaluation in emergency room showed no leukocytosis, normal renal function tests, but her chest x-ray showed right upper lobe infiltrate. She maintained hemodynamic stability while she was in emergency room but her oxygen saturation was in mid 80's. Hospitalist was asked to admit her for community-acquired pneumonia in the setting of bronchiectasis and prior partial lobectomy.   Assessment/Plan:  CAP  - clinically stable and maintaining oxygen saturations at target range, afebrile over 24 hours - will continue Zosyn and Levaquin  - pulm following, Dr. Gwenette Greet, will follow up on recmmendations Back pain/left sided chest wall pain  - continue home medications  - lidocaine patch  Reactive airway disease  - improving  DVT prophylaxis  - Lovenox   Code Status: Full  Family Communication: none  Disposition Plan: plan d/c once PCCM clears  Consultants:  Pulmonary Procedures:  none Antibiotics:  Zosyn 4/13 >>  Levofloxacin 4/13 >>  HPI/Subjective: No events overnight.   Objective: Filed Vitals:   06/06/12 2230 06/07/12 0209 06/07/12 0545 06/07/12 0923  BP:  159/64  148/77   Pulse: 97  91   Temp: 97.9 F (36.6 C)  97.7 F (36.5 C)   TempSrc: Oral  Oral   Resp: _0 Height:      Weight:      SpO2: 96%  99% 100%    Intake/Output Summary (Last 24 hours) at 06/07/12 1332 Last data filed at 06/07/12 1029  Gross per 24 hour  Intake 4792.5 ml  Output   5850 ml  Net -1057.5 ml    Exam:   General:  Pt is alert, follows commands appropriately, not in acute distress  Cardiovascular: Regular rate and rhythm, S1/S2, SEM 2/6, no rubs, no gallops  Respiratory: Bilateral crackles, pop with expiration noted on the right side posteriorly, apparently chronic   Abdomen: Soft, non tender, non distended, bowel sounds present, no guarding  Extremities: No edema, pulses DP and PT palpable bilaterally  Neuro: Grossly nonfocal  Data Reviewed: Basic Metabolic Panel:  Recent Labs Lab 06/03/12 1950 06/06/12 0515  NA 132* 141  K 4.5 4.1  CL 93* 104  CO2 27 29  GLUCOSE 108* 117*  BUN 23 8  CREATININE 0.76 0.65  CALCIUM 9.0 8.8   CBC:  Recent Labs Lab 06/03/12 1950 06/06/12 0515 06/07/12 0515  WBC 7.9 5.7 4.1  NEUTROABS 6.6  --   --   HGB 11.6* 9.4* 9.6*  HCT 35.6* 29.2* 29.7*  MCV 86.6 86.1 85.8  PLT 244 173 179   Recent Results (from the past 240 hour(s))  CULTURE, BLOOD (ROUTINE X 2)     Status: None   Collection Time    06/03/12  7:46 PM      Result Value Range  Status   Specimen Description BLOOD LEFT ARM   Final   Special Requests BOTTLES DRAWN AEROBIC AND ANAEROBIC 5CC EACH   Final   Culture  Setup Time 06/04/2012 02:08   Final   Culture     Final   Value:        BLOOD CULTURE RECEIVED NO GROWTH TO DATE CULTURE WILL BE HELD FOR 5 DAYS BEFORE ISSUING A FINAL NEGATIVE REPORT   Report Status PENDING   Incomplete  CULTURE, BLOOD (ROUTINE X 2)     Status: None   Collection Time    06/03/12  7:54 PM      Result Value Range Status   Specimen Description BLOOD RIGHT HAND   Final   Special Requests BOTTLES DRAWN  AEROBIC ONLY 3CC   Final   Culture  Setup Time 06/04/2012 02:08   Final   Culture     Final   Value:        BLOOD CULTURE RECEIVED NO GROWTH TO DATE CULTURE WILL BE HELD FOR 5 DAYS BEFORE ISSUING A FINAL NEGATIVE REPORT   Report Status PENDING   Incomplete  CULTURE, EXPECTORATED SPUTUM-ASSESSMENT     Status: None   Collection Time    06/04/12  2:40 PM      Result Value Range Status   Specimen Description SPUTUM   Final   Special Requests Normal   Final   Sputum evaluation     Final   Value: THIS SPECIMEN IS ACCEPTABLE. RESPIRATORY CULTURE REPORT TO FOLLOW.   Report Status 06/04/2012 FINAL   Final  CULTURE, RESPIRATORY (NON-EXPECTORATED)     Status: None   Collection Time    06/04/12  2:40 PM      Result Value Range Status   Specimen Description SPUTUM   Final   Special Requests NONE   Final   Gram Stain     Final   Value: RARE WBC PRESENT, PREDOMINANTLY PMN     NO SQUAMOUS EPITHELIAL CELLS SEEN     NO ORGANISMS SEEN   Culture NORMAL OROPHARYNGEAL FLORA   Final   Report Status 06/07/2012 FINAL   Final     Scheduled Meds: . albuterol  2.5 mg Nebulization Q6H  . calcium-vitamin D  1 tablet Oral Daily  . docusate sodium  100 mg Oral BID  . DULoxetine  60 mg Oral QHS  . enoxaparin (LOVENOX) injection  40 mg Subcutaneous QHS  . gabapentin  400 mg Oral QID  . guaiFENesin  1,200 mg Oral BID  . levofloxacin (LEVAQUIN) IV  750 mg Intravenous Q24H  . lidocaine  1 patch Transdermal Q24H  . mometasone-formoterol  2 puff Inhalation BID  . piperacillin-tazobactam (ZOSYN)  IV  3.375 g Intravenous Q8H  . saccharomyces boulardii  250 mg Oral BID  . sodium chloride  3 mL Intravenous Q12H  . Vitamin D (Ergocalciferol)  50,000 Units Oral Q7 days   Continuous Infusions: . sodium chloride 150 mL/hr at 06/07/12 0919   Faye Ramsay, MD  [facility] Pager [phone]  If 7PM-7AM, please contact night-coverage [email or web address] Password [email or web address] 06/07/2012, 1:32 PM   LOS: 4 days

## 2012-06-07 NOTE — Progress Notes (Signed)
Subjective: Feels much improved.  She is better than her cxr looks.  No new fever.  Cultures unhelpful  Objective: Vital signs in last 24 hours: Blood pressure 159/72, pulse 92, temperature 97.9 F (36.6 C), temperature source Oral, resp. rate 20, height 5' 4.96" (1.65 m), weight 55.6 kg (122 lb 9.2 oz), SpO2 97.00%.  Intake/Output from previous day: 04/15 0701 - 04/16 0700 In: 3197.5 [P.O.:480; I.V.:2467.5; IV Piggyback:250] Out: 5250 [Urine:5250]   Physical Exam:   frail appearing female in nad, no increased wob Nose without purulence or discharge noted. Neck without LN or TMG Chest with crackles and pops on left (chronic), crackles on right (new) Cor with mild tachy but regular. LE without edema, no cyanosis Alert and oriented, moves all 4.    Lab Results:  Recent Labs  06/06/12 0515 06/07/12 0515  WBC 5.7 4.1  HGB 9.4* 9.6*  HCT 29.2* 29.7*  PLT 173 179   BMET  Recent Labs  06/06/12 0515  NA 141  K 4.1  CL 104  CO2 29  GLUCOSE 117*  BUN 8  CREATININE 0.65  CALCIUM 8.8    Studies/Results: No results found.  Assessment/Plan:  1) Community acquired pna in pt with severe left lung destruction with bronchiectasis and restrictive lung dz. She has been having more frequent pulmonary infections, and I wonder if she is getting "spillage" from contaminated left lung or ?aspirating at night while lying down?  Will also check serum Ig's for completeness.  Much improved on abx.  Would not worry about current cxr since she is greatly improved clinically.   -difficult decision about home IV abx vs oral since cultures were not able to help [place].  She is not as ill as last hospitalization, which did require prolonged IV abx.  I would change her abx to either levaquin or avelox and treat for an additional ten days. -continue pulmonary toilet -f/u serum Ig's     Kathee Delton, M.D. 06/07/2012, 6:45 PM

## 2012-06-07 NOTE — Progress Notes (Signed)
Pt found to be sitting up in bed, eatiing dinner, with O2 off. O2 sat=86% on RA, no dyspnea/SOB noted at rest. 1lnc applied, O2 sat up to 93% w/i 30 seconds. Pt remains on 1lnc and verbalizes need for O2 to remain in place for the time being.

## 2012-06-07 NOTE — Progress Notes (Deleted)
Pt d/c to [facility] via ambulance. Pt in stable condition, in possession of transfer packet and all personal belongings.

## 2012-06-08 ENCOUNTER — Encounter (HOSPITAL_COMMUNITY): Payer: BC Managed Care – PPO

## 2012-06-08 DIAGNOSIS — D649 Anemia, unspecified: Secondary | ICD-10-CM

## 2012-06-08 LAB — CBC
HCT: 32.6 % — ABNORMAL LOW (ref 36.0–46.0)
MCHC: 32.2 g/dL (ref 30.0–36.0)
MCV: 86.5 fL (ref 78.0–100.0)
Platelets: 215 10*3/uL (ref 150–400)
RDW: 16.3 % — ABNORMAL HIGH (ref 11.5–15.5)

## 2012-06-08 LAB — BASIC METABOLIC PANEL
BUN: 9 mg/dL (ref 6–23)
Calcium: 9.4 mg/dL (ref 8.4–10.5)
Creatinine, Ser: 0.66 mg/dL (ref 0.50–1.10)
GFR calc Af Amer: >90 mL/min (ref >90)
GFR calc non Af Amer: >90 mL/min (ref >90)

## 2012-06-08 MED ORDER — LEVOFLOXACIN 500 MG PO TABS: 750.0000 mg | ORAL_TABLET | Freq: Every day | ORAL | Status: DC

## 2012-06-08 MED ORDER — GUAIFENESIN ER 600 MG PO TB12: 1200.0000 mg | ORAL_TABLET | Freq: Two times a day (BID) | ORAL | Status: DC

## 2012-06-08 MED ORDER — LEVOFLOXACIN 750 MG PO TABS: 750.0000 mg | ORAL_TABLET | Freq: Every day | ORAL | Status: DC

## 2012-06-08 MED ORDER — ALBUTEROL SULFATE (5 MG/ML) 0.5% IN NEBU: 2.5000 mg | INHALATION_SOLUTION | Freq: Four times a day (QID) | RESPIRATORY_TRACT | Status: DC

## 2012-06-08 MED ORDER — LEVOFLOXACIN 500 MG PO TABS
500.0000 mg | ORAL_TABLET | Freq: Every day | ORAL | Status: DC
  Filled 2012-06-08: qty 1

## 2012-06-08 MED ORDER — LEVOFLOXACIN 500 MG PO TABS: 500.0000 mg | ORAL_TABLET | Freq: Every day | ORAL | Status: DC

## 2012-06-08 MED ORDER — HYDROCODONE-ACETAMINOPHEN 5-325 MG PO TABS: 1.0000 | ORAL_TABLET | ORAL | Status: DC | PRN

## 2012-06-08 NOTE — Progress Notes (Signed)
Patient O2 sats at rest with 1L O2: 100%.   At rest with no O2: 95%.    While ambulating with no O2: 84%.   While ambulating with O2: 91%.

## 2012-06-08 NOTE — Progress Notes (Signed)
Pt did not want to new IV. Spoke with charge nurse and after assessing iv we decided to wait until the morning to see if she may go home. Notes from MD seem as if she may leave today.

## 2012-06-08 NOTE — Progress Notes (Signed)
Subjective: Feels much improved.  She is better than her cxr looks.  No new fever.  Cultures unhelpful.  Ready to go home.   Objective: Vital signs in last 24 hours: Blood pressure 147/65, pulse 92, temperature 97.8 F (36.6 C), temperature source Oral, resp. rate 20, height 5' 4.96" (1.65 m), weight 122 lb 9.2 oz (55.6 kg), SpO2 96.00%.  Intake/Output from previous day: 04/16 0701 - 04/17 0700 In: 3447.5 [P.O.:1040; I.V.:2307.5; IV Piggyback:100] Out: 3800 [Urine:3800]   Physical Exam:   frail appearing female in nad, no increased wob Nose without purulence or discharge noted. Neck without LN or TMG Chest with crackles and pops on left (chronic), crackles on right (new) Cor with rrr LE without edema, no cyanosis Alert and oriented, moves all 4.    Lab Results:  Recent Labs  06/06/12 0515 06/07/12 0515 06/08/12 0500  WBC 5.7 4.1 5.1  HGB 9.4* 9.6* 10.5*  HCT 29.2* 29.7* 32.6*  PLT 173 179 215   BMET  Recent Labs  06/06/12 0515 06/08/12 0500  NA 141 138  K 4.1 3.8  CL 104 99  CO2 29 29  GLUCOSE 117* 112*  BUN 8 9  CREATININE 0.65 0.66  CALCIUM 8.8 9.4    Studies/Results: No results found.  Assessment/Plan:  1) Community acquired pna in pt with severe left lung destruction with bronchiectasis and restrictive lung dz. She has been having more frequent pulmonary infections, and I wonder if she is getting "spillage" from contaminated left lung or ?aspirating at night while lying down?  Will also check serum Ig's for completeness.  Much improved on abx.  Would not worry about current cxr since she is greatly improved clinically.   -ok to d/c home from my standpoint with oral levaquin 72m for 10 more days -may need oxygen at discharge, and we can re-eval. At f/u visit. -f/u with me in 2 weeks.     KKathee Delton M.D. 06/08/2012, 12:50 PM

## 2012-06-08 NOTE — Discharge Summary (Addendum)
Physician Discharge Summary  GUSTAVO MEDITZ IWO:[id number] DOB: Aug 27, 1950 DOA: 06/03/2012  PCP: Jerlyn Ly, MD  Admit date: 06/03/2012 Discharge date: 06/08/2012  Recommendations for Outpatient Follow-up:  1. Pt will need to follow up with PCP in 2-3 weeks post discharge 2. Please obtain BMP to evaluate electrolytes and kidney function 3. Please also check CBC to evaluate Hg and Hct levels 4. Please note that pt was discharged on Levaquin 750 mg QD for 10 more days post discharge as recommended by her pulmonologist 5. Please note that pt will be discharged home on oxygen, 2L   Discharge Diagnoses: Acute respiratory failure, CAP Principal Problem:   CAP (community acquired pneumonia) Active Problems:   HYPERLIPIDEMIA   HYPERTENSION   REACTIVE AIRWAY DISEASE   BACK PAIN, CHRONIC   OSTEOPENIA   Restrictive lung disease   Bronchiectasis without acute exacerbation   Acute respiratory failure   Discharge Condition: Stable  Diet recommendation: Heart healthy diet discussed in details   Brief Narrative:  Pt is 62 y.o. 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 difficile colitis, last hospitalization about 4 months ago for pneumonia, presented to the emergency room with 2 days history of mild chills, brown sputum productive cough, and increase in shortness of breath. She is a patient of Dr. Gwenette Greet of the pulmonary service and Dr. Joylene Draft of [facility] medical. She denied any chest pain, abdominal cramps or pain, lightheadedness, nausea, vomiting, or any other symptomology. Evaluation in emergency room showed no leukocytosis, normal renal function tests, but her chest x-ray showed right upper lobe infiltrate. She maintained hemodynamic stability while she was in emergency room but her oxygen saturation was in mid 80's. Hospitalist was asked to  admit her for community-acquired pneumonia in the setting of bronchiectasis and prior partial lobectomy.   Assessment/Plan:  CAP  - clinically stable and maintaining oxygen saturations at target range on [place] 2 L, afebrile over 48 hours  - will continue Levaquin upon discharge 750 mg QD as recommended by her pulmonologist - pulm following, Dr. Gwenette Greet, please note that pt was discharged home on oxygen 2L Back pain/left sided chest wall pain  - continue home medications  - lidocaine patch as needed Reactive airway disease  - improving and maintaining oxygen saturations on 2L [place] - Levaquin to continue therapy for 10 more days as noted above, 750 mg QD DVT prophylaxis  - Lovenox while inpatient   Code Status: Full  Family Communication: none at bedside, plan discussed with patient   Consultants:  Pulmonary Procedures:  none Antibiotics:  Zosyn 4/13 >> 4/17 Levofloxacin 750 mg QD 4/13 >> 06/17/2012  Discharge Exam: Filed Vitals:   06/08/12 0449  BP: 147/65  Pulse: 92  Temp: 97.8 F (36.6 C)  Resp: 20   Filed Vitals:   06/07/12 2215 06/08/12 0217 06/08/12 0449 06/08/12 0733  BP: 176/81  147/65   Pulse:   92   Temp:   97.8 F (36.6 C)   TempSrc:   Oral   Resp:   20   Height:      Weight:      SpO2:  96% 98% 96%    General: Pt is alert, follows commands appropriately, not in acute distress Cardiovascular: Regular rate and rhythm, S1/S2 +, no murmurs, no rubs, no gallops Respiratory: bilateral crackles right > left, no wheezing, no respiratory distress Abdominal: Soft, non tender, non distended, bowel sounds +, no  guarding Extremities: no edema, no cyanosis, pulses palpable bilaterally DP and PT Neuro: Grossly nonfocal  Discharge Instructions   Future Appointments Provider Department Dept Phone   06/08/2012 1:30 PM [facility] [place] [phone]   06/13/2012 1:30 PM [facility] [place] [phone]   06/15/2012 1:30 PM [facility] [place] [phone]   06/20/2012 1:30 PM [facility] [place] [phone]   06/22/2012 1:30 PM [facility] [place] [phone]   07/10/2012 1:30 PM Kathee Delton, MD [facility] [phone]       Medication List    STOP taking these medications       azithromycin 250 MG tablet  Commonly known as:  ZITHROMAX     dextromethorphan-guaiFENesin 30-600 MG per 12 hr tablet  Commonly known as:  MUCINEX DM     oxyCODONE 5 MG immediate release tablet  Commonly known as:  Oxy IR/ROXICODONE      TAKE these medications       albuterol (5 MG/ML) 0.5% nebulizer solution  Commonly known as:  PROVENTIL  Take 0.5 mLs (2.5 mg total) by nebulization every 6 (six) hours.     albuterol 108 (90 BASE) MCG/ACT inhaler  Commonly known as:  PROVENTIL HFA;VENTOLIN HFA  Inhale 2 puffs into the lungs every 6 (six) hours as needed. For shortness of breath.     calcium-vitamin D 500-200 MG-UNIT per tablet  Commonly known as:  OSCAL WITH D  Take 1 tablet by mouth daily.     DULoxetine 60 MG capsule  Commonly known as:  CYMBALTA  Take 60 mg by mouth every evening.     gabapentin 400 MG capsule  Commonly known as:  NEURONTIN  Take 400 mg by mouth 4 (four) times daily.     guaiFENesin 600 MG 12 hr tablet  Commonly known as:  MUCINEX  Take 2 tablets (1,200 mg total) by mouth 2 (two) times daily.     HYDROcodone-acetaminophen 5-325 MG per tablet  Commonly known as:  NORCO/VICODIN  Take 1-2 tablets by mouth every 4 (four) hours as needed.     levofloxacin 750 MG tablet  Commonly known as:  LEVAQUIN  Take 1 tablet (750 mg total) by mouth at bedtime. For 10 days     lidocaine 5 %  Commonly known as:  LIDODERM  Place 1 patch onto the skin daily. Remove & Discard patch  within 12 hours or as directed by MD     mometasone-formoterol 100-5 MCG/ACT Aero  Commonly known as:  DULERA  Inhale 2 puffs into the lungs daily.     multivitamin with minerals Tabs  Take 1 tablet by mouth daily.     saccharomyces boulardii 250 MG capsule  Commonly known as:  FLORASTOR  Take 1 capsule (250 mg total) by mouth 2 (two) times daily.     Vitamin D (Ergocalciferol) 50000 UNITS Caps  Commonly known as:  DRISDOL  Take 50,000 Units by mouth every 7 (seven) days.           Follow-up Information   Follow up with PERINI,MARK A, MD In 2 weeks.   Contact information:   [place] [address] [phone]        The results of significant diagnostics from this hospitalization (including imaging, microbiology, ancillary and laboratory) are listed below for reference.  Microbiology: Recent Results (from the past 240 hour(s))  CULTURE, BLOOD (ROUTINE X 2)     Status: None   Collection Time    06/03/12  7:46 PM      Result Value Range Status   Specimen Description BLOOD LEFT ARM   Final   Special Requests BOTTLES DRAWN AEROBIC AND ANAEROBIC 5CC EACH   Final   Culture  Setup Time 06/04/2012 02:08   Final   Culture     Final   Value:        BLOOD CULTURE RECEIVED NO GROWTH TO DATE CULTURE WILL BE HELD FOR 5 DAYS BEFORE ISSUING A FINAL NEGATIVE REPORT   Report Status PENDING   Incomplete  CULTURE, BLOOD (ROUTINE X 2)     Status: None   Collection Time    06/03/12  7:54 PM      Result Value Range Status   Specimen Description BLOOD RIGHT HAND   Final   Special Requests BOTTLES DRAWN AEROBIC ONLY 3CC   Final   Culture  Setup Time 06/04/2012 02:08   Final   Culture     Final   Value:        BLOOD CULTURE RECEIVED NO GROWTH TO DATE CULTURE WILL BE HELD FOR 5 DAYS BEFORE ISSUING A FINAL NEGATIVE REPORT   Report Status PENDING   Incomplete  CULTURE, EXPECTORATED SPUTUM-ASSESSMENT     Status: None   Collection Time    06/04/12  2:40 PM      Result  Value Range Status   Specimen Description SPUTUM   Final   Special Requests Normal   Final   Sputum evaluation     Final   Value: THIS SPECIMEN IS ACCEPTABLE. RESPIRATORY CULTURE REPORT TO FOLLOW.   Report Status 06/04/2012 FINAL   Final  CULTURE, RESPIRATORY (NON-EXPECTORATED)     Status: None   Collection Time    06/04/12  2:40 PM      Result Value Range Status   Specimen Description SPUTUM   Final   Special Requests NONE   Final   Gram Stain     Final   Value: RARE WBC PRESENT, PREDOMINANTLY PMN     NO SQUAMOUS EPITHELIAL CELLS SEEN     NO ORGANISMS SEEN   Culture NORMAL OROPHARYNGEAL FLORA   Final   Report Status 06/07/2012 FINAL   Final     Labs: Basic Metabolic Panel:  Recent Labs Lab 06/03/12 1950 06/06/12 0515 06/08/12 0500  NA 132* 141 138  K 4.5 4.1 3.8  CL 93* 104 99  CO2 _0 GLUCOSE 108* 117* 112*  BUN _1 CREATININE 0.76 0.65 0.66  CALCIUM 9.0 8.8 9.4   CBC:  Recent Labs Lab 06/03/12 1950 06/06/12 0515 06/07/12 0515 06/08/12 0500  WBC 7.9 5.7 4.1 5.1  NEUTROABS 6.6  --   --   --   HGB 11.6* 9.4* 9.6* 10.5*  HCT 35.6* 29.2* 29.7* 32.6*  MCV 86.6 86.1 85.8 86.5  PLT 244 173 179 215   SIGNED: Time coordinating discharge: Over 30 minutes  Faye Ramsay, MD  [facility] 06/08/2012, 9:43 AM Pager [phone]  If 7PM-7AM, please contact night-coverage [email or web address] Password [email or web address]

## 2012-06-10 LAB — CULTURE, BLOOD (ROUTINE X 2)
Culture: NO GROWTH
Culture: NO GROWTH

## 2012-06-13 ENCOUNTER — Encounter (HOSPITAL_COMMUNITY): Payer: BC Managed Care – PPO

## 2012-06-15 ENCOUNTER — Encounter (HOSPITAL_COMMUNITY): Payer: BC Managed Care – PPO

## 2012-06-15 ENCOUNTER — Telehealth: Payer: Self-pay | Admitting: Pulmonary Disease

## 2012-06-15 NOTE — Telephone Encounter (Signed)
Spoke with pt She states discharged on 06/08/12 Her d/c summary does not mention when to f/u with pulm, but pt states [place] verbally told her to f/u in 2 wks after d/c [facility]'s first available for this is not until 07/05/12 She refuses to see TP She states although she is feeling better, she does not feel she should wait this long Please advise if okay to work in sooner thank!

## 2012-06-16 NOTE — Telephone Encounter (Signed)
Deborah [name], see if we can get her in earlier.  Work on this next week and let her know.

## 2012-06-19 NOTE — Telephone Encounter (Signed)
Pt rescheduled 06/26/12 for 215 per [facility].  07/10/12 appt has been cancelled--noted "Per [facility] request"

## 2012-06-20 ENCOUNTER — Encounter (HOSPITAL_COMMUNITY): Payer: BC Managed Care – PPO

## 2012-06-22 ENCOUNTER — Encounter (HOSPITAL_COMMUNITY): Payer: Self-pay

## 2012-06-26 ENCOUNTER — Encounter: Payer: Self-pay | Admitting: Pulmonary Disease

## 2012-06-26 ENCOUNTER — Ambulatory Visit (INDEPENDENT_AMBULATORY_CARE_PROVIDER_SITE_OTHER): Payer: BC Managed Care – PPO | Admitting: Pulmonary Disease

## 2012-06-26 VITALS — BP 132/78 | HR 105 | Temp 97.4°F | Ht 65.0 in | Wt 123.4 lb

## 2012-06-26 DIAGNOSIS — J984 Other disorders of lung: Secondary | ICD-10-CM

## 2012-06-26 DIAGNOSIS — J45909 Unspecified asthma, uncomplicated: Secondary | ICD-10-CM

## 2012-06-26 DIAGNOSIS — J479 Bronchiectasis, uncomplicated: Secondary | ICD-10-CM

## 2012-06-26 NOTE — Progress Notes (Signed)
  Subjective:    Patient ID: Deborah [name], female    DOB: June 14, 1950, 62 y.o.   MRN: [id number]  HPI The pt comes in today for f/u after her recent hospitalization for pna/bronchiectasis flare.  She has completed her course of abx, and feels she is doing well.  She is not wearing oxygen, and no chest congestion.  She still has her usual am cough that is a part of her bronchiectasis.  She is using flutter valve as well. She feels her breathing is well, and just got back from a trip to [place].    Review of Systems  Constitutional: Negative for fever and unexpected weight change.  HENT: Negative for ear pain, nosebleeds, congestion, sore throat, rhinorrhea, sneezing, trouble swallowing, dental problem, postnasal drip and sinus pressure.   Eyes: Negative for redness and itching.  Respiratory: Positive for shortness of breath ( with heavy exertion) and wheezing. Negative for cough and chest tightness.   Cardiovascular: Negative for palpitations and leg swelling.  Gastrointestinal: Negative for nausea and vomiting.  Genitourinary: Negative for dysuria.  Musculoskeletal: Negative for joint swelling.  Skin: Negative for rash.  Neurological: Negative for headaches.  Hematological: Does not bruise/bleed easily.  Psychiatric/Behavioral: Negative for dysphoric mood. The patient is not nervous/anxious.        Objective:   Physical Exam Thin female in nad Nose without purulence or discharge noted. Neck without LN or TMG Chest with decreased bs on right, a few scattered crackles, no wheezing Cor with rrr LE without edema, no cyanosis. Alert and oriented, moves all 4.        Assessment & Plan:

## 2012-06-26 NOTE — Assessment & Plan Note (Signed)
The pt has done well after a recent flareup.  She has completed abx, and has returned to baseline.  She has no significant cough or sob above baseline.

## 2012-06-26 NOTE — Assessment & Plan Note (Signed)
Doing well on dulera.  I asked her to continue with this.

## 2012-06-26 NOTE — Patient Instructions (Addendum)
No change in current medications. Stay as active as possible, and get back to pulmonary rehab. Will send an order to discontinue your oxygen.  followup with me in 66mo if doing well.

## 2012-07-10 ENCOUNTER — Ambulatory Visit: Payer: BC Managed Care – PPO | Admitting: Pulmonary Disease

## 2012-07-19 ENCOUNTER — Encounter (HOSPITAL_COMMUNITY)
Admission: RE | Admit: 2012-07-19 | Discharge: 2012-07-19 | Disposition: A | Discharge status: Home / Self Care | Payer: Self-pay | Source: Ambulatory Visit | Attending: Internal Medicine | Admitting: Internal Medicine

## 2012-07-19 DIAGNOSIS — D649 Anemia, unspecified: Secondary | ICD-10-CM | POA: Insufficient documentation

## 2012-07-21 ENCOUNTER — Encounter (HOSPITAL_COMMUNITY)
Admission: RE | Admit: 2012-07-21 | Discharge: 2012-07-21 | Disposition: A | Discharge status: Home / Self Care | Payer: Self-pay | Source: Ambulatory Visit | Attending: Pulmonary Disease | Admitting: Pulmonary Disease

## 2012-07-24 ENCOUNTER — Encounter (HOSPITAL_COMMUNITY)
Admission: RE | Admit: 2012-07-24 | Discharge: 2012-07-24 | Disposition: A | Discharge status: Home / Self Care | Payer: Self-pay | Source: Ambulatory Visit | Attending: Internal Medicine | Admitting: Internal Medicine

## 2012-07-24 DIAGNOSIS — D649 Anemia, unspecified: Secondary | ICD-10-CM | POA: Insufficient documentation

## 2012-07-26 ENCOUNTER — Encounter (HOSPITAL_COMMUNITY)
Admission: RE | Admit: 2012-07-26 | Discharge: 2012-07-26 | Disposition: A | Discharge status: Home / Self Care | Payer: Self-pay | Source: Ambulatory Visit | Attending: Pulmonary Disease | Admitting: Pulmonary Disease

## 2012-07-28 ENCOUNTER — Encounter (HOSPITAL_COMMUNITY)
Admission: RE | Admit: 2012-07-28 | Discharge: 2012-07-28 | Disposition: A | Discharge status: Home / Self Care | Payer: Self-pay | Source: Ambulatory Visit | Attending: Pulmonary Disease | Admitting: Pulmonary Disease

## 2012-07-31 ENCOUNTER — Encounter (HOSPITAL_COMMUNITY)
Admission: RE | Admit: 2012-07-31 | Discharge: 2012-07-31 | Disposition: A | Discharge status: Home / Self Care | Payer: Self-pay | Source: Ambulatory Visit | Attending: Pulmonary Disease | Admitting: Pulmonary Disease

## 2012-08-02 ENCOUNTER — Encounter (HOSPITAL_COMMUNITY)
Admission: RE | Admit: 2012-08-02 | Discharge: 2012-08-02 | Disposition: A | Discharge status: Home / Self Care | Payer: Self-pay | Source: Ambulatory Visit | Attending: Pulmonary Disease | Admitting: Pulmonary Disease

## 2012-08-03 ENCOUNTER — Telehealth: Payer: Self-pay | Admitting: Pulmonary Disease

## 2012-08-03 NOTE — Telephone Encounter (Signed)
We gave order to [facility] on 06/26/12. I called Melissa # and spoke with Gwinda Passe as Lenna Sciara is out. She stated she will pull the order and send over to [facility].   I called made pt aware. Nothing further was needed

## 2012-08-04 ENCOUNTER — Encounter (HOSPITAL_COMMUNITY)
Admission: RE | Admit: 2012-08-04 | Discharge: 2012-08-04 | Disposition: A | Discharge status: Home / Self Care | Payer: Self-pay | Source: Ambulatory Visit | Attending: Pulmonary Disease | Admitting: Pulmonary Disease

## 2012-08-07 ENCOUNTER — Encounter (HOSPITAL_COMMUNITY): Payer: Self-pay

## 2012-08-09 ENCOUNTER — Encounter (HOSPITAL_COMMUNITY): Payer: Self-pay

## 2012-08-11 ENCOUNTER — Encounter (HOSPITAL_COMMUNITY)
Admission: RE | Admit: 2012-08-11 | Discharge: 2012-08-11 | Disposition: A | Discharge status: Home / Self Care | Payer: Self-pay | Source: Ambulatory Visit | Attending: Pulmonary Disease | Admitting: Pulmonary Disease

## 2012-08-14 ENCOUNTER — Encounter (HOSPITAL_COMMUNITY)
Admission: RE | Admit: 2012-08-14 | Discharge: 2012-08-14 | Disposition: A | Discharge status: Home / Self Care | Payer: Self-pay | Source: Ambulatory Visit | Attending: Pulmonary Disease | Admitting: Pulmonary Disease

## 2012-08-16 ENCOUNTER — Encounter (HOSPITAL_COMMUNITY)
Admission: RE | Admit: 2012-08-16 | Discharge: 2012-08-16 | Disposition: A | Discharge status: Home / Self Care | Payer: Self-pay | Source: Ambulatory Visit | Attending: Pulmonary Disease | Admitting: Pulmonary Disease

## 2012-08-18 ENCOUNTER — Encounter (HOSPITAL_COMMUNITY)
Admission: RE | Admit: 2012-08-18 | Discharge: 2012-08-18 | Disposition: A | Discharge status: Home / Self Care | Payer: Self-pay | Source: Ambulatory Visit | Attending: Pulmonary Disease | Admitting: Pulmonary Disease

## 2012-08-21 ENCOUNTER — Encounter (HOSPITAL_COMMUNITY)
Admission: RE | Admit: 2012-08-21 | Discharge: 2012-08-21 | Disposition: A | Discharge status: Home / Self Care | Payer: Self-pay | Source: Ambulatory Visit | Attending: Pulmonary Disease | Admitting: Pulmonary Disease

## 2012-08-23 ENCOUNTER — Encounter (HOSPITAL_COMMUNITY)
Admission: RE | Admit: 2012-08-23 | Discharge: 2012-08-23 | Disposition: A | Discharge status: Home / Self Care | Payer: Self-pay | Source: Ambulatory Visit | Attending: Internal Medicine | Admitting: Internal Medicine

## 2012-08-23 DIAGNOSIS — D649 Anemia, unspecified: Secondary | ICD-10-CM | POA: Insufficient documentation

## 2012-08-25 ENCOUNTER — Encounter (HOSPITAL_COMMUNITY): Payer: Self-pay

## 2012-08-28 ENCOUNTER — Encounter (HOSPITAL_COMMUNITY)
Admission: RE | Admit: 2012-08-28 | Discharge: 2012-08-28 | Disposition: A | Discharge status: Home / Self Care | Payer: Self-pay | Source: Ambulatory Visit | Attending: Pulmonary Disease | Admitting: Pulmonary Disease

## 2012-08-30 ENCOUNTER — Encounter (HOSPITAL_COMMUNITY)
Admission: RE | Admit: 2012-08-30 | Discharge: 2012-08-30 | Disposition: A | Discharge status: Home / Self Care | Payer: Self-pay | Source: Ambulatory Visit | Attending: Pulmonary Disease | Admitting: Pulmonary Disease

## 2012-09-01 ENCOUNTER — Encounter (HOSPITAL_COMMUNITY)
Admission: RE | Admit: 2012-09-01 | Discharge: 2012-09-01 | Disposition: A | Discharge status: Home / Self Care | Payer: Self-pay | Source: Ambulatory Visit | Attending: Pulmonary Disease | Admitting: Pulmonary Disease

## 2012-09-04 ENCOUNTER — Encounter (HOSPITAL_COMMUNITY)
Admission: RE | Admit: 2012-09-04 | Discharge: 2012-09-04 | Disposition: A | Discharge status: Home / Self Care | Payer: Self-pay | Source: Ambulatory Visit | Attending: Pulmonary Disease | Admitting: Pulmonary Disease

## 2012-09-06 ENCOUNTER — Encounter (HOSPITAL_COMMUNITY)
Admission: RE | Admit: 2012-09-06 | Discharge: 2012-09-06 | Disposition: A | Discharge status: Home / Self Care | Payer: Self-pay | Source: Ambulatory Visit | Attending: Pulmonary Disease | Admitting: Pulmonary Disease

## 2012-09-08 ENCOUNTER — Encounter (HOSPITAL_COMMUNITY): Payer: Self-pay

## 2012-09-11 ENCOUNTER — Encounter (HOSPITAL_COMMUNITY)
Admission: RE | Admit: 2012-09-11 | Discharge: 2012-09-11 | Disposition: A | Discharge status: Home / Self Care | Payer: Self-pay | Source: Ambulatory Visit | Attending: Pulmonary Disease | Admitting: Pulmonary Disease

## 2012-09-13 ENCOUNTER — Encounter (HOSPITAL_COMMUNITY)
Admission: RE | Admit: 2012-09-13 | Discharge: 2012-09-13 | Disposition: A | Discharge status: Home / Self Care | Payer: Self-pay | Source: Ambulatory Visit | Attending: Pulmonary Disease | Admitting: Pulmonary Disease

## 2012-09-15 ENCOUNTER — Encounter (HOSPITAL_COMMUNITY)
Admission: RE | Admit: 2012-09-15 | Discharge: 2012-09-15 | Disposition: A | Discharge status: Home / Self Care | Payer: Self-pay | Source: Ambulatory Visit | Attending: Pulmonary Disease | Admitting: Pulmonary Disease

## 2012-09-18 ENCOUNTER — Encounter (HOSPITAL_COMMUNITY)
Admission: RE | Admit: 2012-09-18 | Discharge: 2012-09-18 | Disposition: A | Discharge status: Home / Self Care | Payer: Self-pay | Source: Ambulatory Visit | Attending: Pulmonary Disease | Admitting: Pulmonary Disease

## 2012-09-20 ENCOUNTER — Encounter (HOSPITAL_COMMUNITY)
Admission: RE | Admit: 2012-09-20 | Discharge: 2012-09-20 | Disposition: A | Discharge status: Home / Self Care | Payer: Self-pay | Source: Ambulatory Visit | Attending: Pulmonary Disease | Admitting: Pulmonary Disease

## 2012-09-22 ENCOUNTER — Encounter (HOSPITAL_COMMUNITY): Payer: Self-pay

## 2012-09-22 DIAGNOSIS — D649 Anemia, unspecified: Secondary | ICD-10-CM | POA: Insufficient documentation

## 2012-09-25 ENCOUNTER — Encounter (HOSPITAL_COMMUNITY)
Admission: RE | Admit: 2012-09-25 | Discharge: 2012-09-25 | Disposition: A | Discharge status: Home / Self Care | Payer: Self-pay | Source: Ambulatory Visit | Attending: Internal Medicine | Admitting: Internal Medicine

## 2012-09-27 ENCOUNTER — Encounter (HOSPITAL_COMMUNITY)
Admission: RE | Admit: 2012-09-27 | Discharge: 2012-09-27 | Disposition: A | Discharge status: Home / Self Care | Payer: Self-pay | Source: Ambulatory Visit | Attending: Pulmonary Disease | Admitting: Pulmonary Disease

## 2012-09-29 ENCOUNTER — Encounter (HOSPITAL_COMMUNITY)
Admission: RE | Admit: 2012-09-29 | Discharge: 2012-09-29 | Disposition: A | Discharge status: Home / Self Care | Payer: Self-pay | Source: Ambulatory Visit | Attending: Pulmonary Disease | Admitting: Pulmonary Disease

## 2012-10-02 ENCOUNTER — Encounter (HOSPITAL_COMMUNITY)
Admission: RE | Admit: 2012-10-02 | Discharge: 2012-10-02 | Disposition: A | Discharge status: Home / Self Care | Payer: Self-pay | Source: Ambulatory Visit | Attending: Pulmonary Disease | Admitting: Pulmonary Disease

## 2012-10-04 ENCOUNTER — Encounter (HOSPITAL_COMMUNITY)
Admission: RE | Admit: 2012-10-04 | Discharge: 2012-10-04 | Disposition: A | Discharge status: Home / Self Care | Payer: Self-pay | Source: Ambulatory Visit | Attending: Pulmonary Disease | Admitting: Pulmonary Disease

## 2012-10-06 ENCOUNTER — Encounter (HOSPITAL_COMMUNITY)
Admission: RE | Admit: 2012-10-06 | Discharge: 2012-10-06 | Disposition: A | Discharge status: Home / Self Care | Payer: Self-pay | Source: Ambulatory Visit | Attending: Pulmonary Disease | Admitting: Pulmonary Disease

## 2012-10-09 ENCOUNTER — Encounter (HOSPITAL_COMMUNITY)
Admission: RE | Admit: 2012-10-09 | Discharge: 2012-10-09 | Disposition: A | Discharge status: Home / Self Care | Payer: Self-pay | Source: Ambulatory Visit | Attending: Pulmonary Disease | Admitting: Pulmonary Disease

## 2012-10-11 ENCOUNTER — Encounter (HOSPITAL_COMMUNITY)
Admission: RE | Admit: 2012-10-11 | Discharge: 2012-10-11 | Disposition: A | Discharge status: Home / Self Care | Payer: Self-pay | Source: Ambulatory Visit | Attending: Pulmonary Disease | Admitting: Pulmonary Disease

## 2012-10-13 ENCOUNTER — Encounter (HOSPITAL_COMMUNITY)
Admission: RE | Admit: 2012-10-13 | Discharge: 2012-10-13 | Disposition: A | Discharge status: Home / Self Care | Payer: Self-pay | Source: Ambulatory Visit | Attending: Pulmonary Disease | Admitting: Pulmonary Disease

## 2012-10-16 ENCOUNTER — Encounter (HOSPITAL_COMMUNITY)
Admission: RE | Admit: 2012-10-16 | Discharge: 2012-10-16 | Disposition: A | Discharge status: Home / Self Care | Payer: Self-pay | Source: Ambulatory Visit | Attending: Pulmonary Disease | Admitting: Pulmonary Disease

## 2012-10-18 ENCOUNTER — Encounter (HOSPITAL_COMMUNITY)
Admission: RE | Admit: 2012-10-18 | Discharge: 2012-10-18 | Disposition: A | Discharge status: Home / Self Care | Payer: Self-pay | Source: Ambulatory Visit | Attending: Pulmonary Disease | Admitting: Pulmonary Disease

## 2012-10-20 ENCOUNTER — Encounter (HOSPITAL_COMMUNITY)
Admission: RE | Admit: 2012-10-20 | Discharge: 2012-10-20 | Disposition: A | Discharge status: Home / Self Care | Payer: Self-pay | Source: Ambulatory Visit | Attending: Pulmonary Disease | Admitting: Pulmonary Disease

## 2012-10-23 ENCOUNTER — Encounter (HOSPITAL_COMMUNITY): Payer: BC Managed Care – PPO

## 2012-10-23 DIAGNOSIS — D649 Anemia, unspecified: Secondary | ICD-10-CM | POA: Insufficient documentation

## 2012-10-25 ENCOUNTER — Encounter (HOSPITAL_COMMUNITY)
Admission: RE | Admit: 2012-10-25 | Discharge: 2012-10-25 | Disposition: A | Discharge status: Home / Self Care | Payer: BC Managed Care – PPO | Source: Ambulatory Visit | Attending: Internal Medicine | Admitting: Internal Medicine

## 2012-10-27 ENCOUNTER — Encounter (HOSPITAL_COMMUNITY)
Admission: RE | Admit: 2012-10-27 | Discharge: 2012-10-27 | Disposition: A | Discharge status: Home / Self Care | Payer: BC Managed Care – PPO | Source: Ambulatory Visit | Attending: Pulmonary Disease | Admitting: Pulmonary Disease

## 2012-10-30 ENCOUNTER — Encounter: Payer: Self-pay | Admitting: Pulmonary Disease

## 2012-10-30 ENCOUNTER — Encounter (HOSPITAL_COMMUNITY): Payer: BC Managed Care – PPO

## 2012-10-30 ENCOUNTER — Ambulatory Visit (INDEPENDENT_AMBULATORY_CARE_PROVIDER_SITE_OTHER): Payer: BC Managed Care – PPO | Admitting: Pulmonary Disease

## 2012-10-30 VITALS — BP 138/82 | HR 104 | Temp 97.0°F | Ht 65.0 in | Wt 124.0 lb

## 2012-10-30 DIAGNOSIS — J45909 Unspecified asthma, uncomplicated: Secondary | ICD-10-CM

## 2012-10-30 DIAGNOSIS — J984 Other disorders of lung: Secondary | ICD-10-CM

## 2012-10-30 DIAGNOSIS — J479 Bronchiectasis, uncomplicated: Secondary | ICD-10-CM

## 2012-10-30 DIAGNOSIS — Z23 Encounter for immunization: Secondary | ICD-10-CM

## 2012-10-30 NOTE — Progress Notes (Signed)
  Subjective:    Patient ID: Deborah [name], female    DOB: [birth year], 62 y.o.   MRN: [id number]  HPI Patient comes in today for followup of her known bronchiectasis and restrictive lung disease.  She has done very well since the last visit, and is participating in rehabilitation.  Her appetite has been adequate, and she is maintaining her weight since the last visit.  She denies any worsening cough, congestion, or purulence.  She did have what sounds like an upper respiratory infection in August, and more than likely was viral in nature.  She has not had any flareup of her reactive airways disease on her current bronchodilator regimen.   Review of Systems  Constitutional: Negative for fever and unexpected weight change.  HENT: Negative for ear pain, nosebleeds, congestion, sore throat, rhinorrhea, sneezing, trouble swallowing, dental problem, postnasal drip and sinus pressure.   Eyes: Negative for redness and itching.  Respiratory: Positive for shortness of breath. Negative for cough, chest tightness and wheezing.   Cardiovascular: Negative for palpitations and leg swelling.  Gastrointestinal: Negative for nausea and vomiting.  Genitourinary: Negative for dysuria.  Musculoskeletal: Negative for joint swelling.  Skin: Negative for rash.  Neurological: Negative for headaches.  Hematological: Does not bruise/bleed easily.  Psychiatric/Behavioral: Negative for dysphoric mood. The patient is not nervous/anxious.        Objective:   Physical Exam Thin female in no acute distress Nose without purulent or discharge noted Neck without lymphadenopathy or thyromegaly Chest with decreased breath sounds in the left base, but surprisingly no crackles or rhonchi. Cardiac exam with regular rate and rhythm Lower extremities without edema, no cyanosis Alert and oriented, moves all 4 extremities.       Assessment & Plan:

## 2012-10-30 NOTE — Assessment & Plan Note (Signed)
The patient has been doing very well since the last visit, with only a probable viral URI in August of this year.  She is maintaining her weight, and is also participating in rehabilitation.  Her oxygen saturations are maintaining even with exertional activity.  I've asked her to continue on her current medications, and to followup with me again in 4 months.

## 2012-10-30 NOTE — Patient Instructions (Addendum)
No change in medications Will give you the flu shot today. Keep working in rehab.  You are doing well. followup with me in 51mo.

## 2012-10-30 NOTE — Assessment & Plan Note (Signed)
No flare while maintaining on dulera.

## 2012-11-01 ENCOUNTER — Encounter (HOSPITAL_COMMUNITY)
Admission: RE | Admit: 2012-11-01 | Discharge: 2012-11-01 | Disposition: A | Discharge status: Home / Self Care | Payer: BC Managed Care – PPO | Source: Ambulatory Visit | Attending: Pulmonary Disease | Admitting: Pulmonary Disease

## 2012-11-03 ENCOUNTER — Encounter (HOSPITAL_COMMUNITY)
Admission: RE | Admit: 2012-11-03 | Discharge: 2012-11-03 | Disposition: A | Discharge status: Home / Self Care | Payer: BC Managed Care – PPO | Source: Ambulatory Visit | Attending: Pulmonary Disease | Admitting: Pulmonary Disease

## 2012-11-06 ENCOUNTER — Encounter (HOSPITAL_COMMUNITY): Payer: BC Managed Care – PPO

## 2012-11-08 ENCOUNTER — Encounter (HOSPITAL_COMMUNITY): Payer: BC Managed Care – PPO

## 2012-11-10 ENCOUNTER — Encounter (HOSPITAL_COMMUNITY): Payer: BC Managed Care – PPO

## 2012-11-13 ENCOUNTER — Encounter (HOSPITAL_COMMUNITY)
Admission: RE | Admit: 2012-11-13 | Discharge: 2012-11-13 | Disposition: A | Discharge status: Home / Self Care | Payer: BC Managed Care – PPO | Source: Ambulatory Visit | Attending: Pulmonary Disease | Admitting: Pulmonary Disease

## 2012-11-15 ENCOUNTER — Encounter (HOSPITAL_COMMUNITY)
Admission: RE | Admit: 2012-11-15 | Discharge: 2012-11-15 | Disposition: A | Discharge status: Home / Self Care | Payer: BC Managed Care – PPO | Source: Ambulatory Visit | Attending: Pulmonary Disease | Admitting: Pulmonary Disease

## 2012-11-17 ENCOUNTER — Encounter (HOSPITAL_COMMUNITY)
Admission: RE | Admit: 2012-11-17 | Discharge: 2012-11-17 | Disposition: A | Discharge status: Home / Self Care | Payer: BC Managed Care – PPO | Source: Ambulatory Visit | Attending: Pulmonary Disease | Admitting: Pulmonary Disease

## 2012-11-20 ENCOUNTER — Encounter (HOSPITAL_COMMUNITY)
Admission: RE | Admit: 2012-11-20 | Discharge: 2012-11-20 | Disposition: A | Discharge status: Home / Self Care | Payer: BC Managed Care – PPO | Source: Ambulatory Visit | Attending: Pulmonary Disease | Admitting: Pulmonary Disease

## 2012-11-22 ENCOUNTER — Encounter (HOSPITAL_COMMUNITY)
Admission: RE | Admit: 2012-11-22 | Discharge: 2012-11-22 | Disposition: A | Discharge status: Home / Self Care | Payer: Self-pay | Source: Ambulatory Visit | Attending: Internal Medicine | Admitting: Internal Medicine

## 2012-11-22 DIAGNOSIS — D649 Anemia, unspecified: Secondary | ICD-10-CM | POA: Insufficient documentation

## 2012-11-24 ENCOUNTER — Encounter (HOSPITAL_COMMUNITY)
Admission: RE | Admit: 2012-11-24 | Discharge: 2012-11-24 | Disposition: A | Discharge status: Home / Self Care | Payer: Self-pay | Source: Ambulatory Visit | Attending: Pulmonary Disease | Admitting: Pulmonary Disease

## 2012-11-27 ENCOUNTER — Encounter (HOSPITAL_COMMUNITY)
Admission: RE | Admit: 2012-11-27 | Discharge: 2012-11-27 | Disposition: A | Discharge status: Home / Self Care | Payer: Self-pay | Source: Ambulatory Visit | Attending: Pulmonary Disease | Admitting: Pulmonary Disease

## 2012-11-29 ENCOUNTER — Encounter (HOSPITAL_COMMUNITY)
Admission: RE | Admit: 2012-11-29 | Discharge: 2012-11-29 | Disposition: A | Discharge status: Home / Self Care | Payer: Self-pay | Source: Ambulatory Visit | Attending: Pulmonary Disease | Admitting: Pulmonary Disease

## 2012-12-01 ENCOUNTER — Encounter (HOSPITAL_COMMUNITY)
Admission: RE | Admit: 2012-12-01 | Discharge: 2012-12-01 | Disposition: A | Discharge status: Home / Self Care | Payer: Self-pay | Source: Ambulatory Visit | Attending: Pulmonary Disease | Admitting: Pulmonary Disease

## 2012-12-04 ENCOUNTER — Encounter (HOSPITAL_COMMUNITY)
Admission: RE | Admit: 2012-12-04 | Discharge: 2012-12-04 | Disposition: A | Discharge status: Home / Self Care | Payer: Self-pay | Source: Ambulatory Visit | Attending: Pulmonary Disease | Admitting: Pulmonary Disease

## 2012-12-06 ENCOUNTER — Encounter (HOSPITAL_COMMUNITY)
Admission: RE | Admit: 2012-12-06 | Discharge: 2012-12-06 | Disposition: A | Discharge status: Home / Self Care | Payer: Self-pay | Source: Ambulatory Visit | Attending: Pulmonary Disease | Admitting: Pulmonary Disease

## 2012-12-08 ENCOUNTER — Encounter (HOSPITAL_COMMUNITY)
Admission: RE | Admit: 2012-12-08 | Discharge: 2012-12-08 | Disposition: A | Discharge status: Home / Self Care | Payer: Self-pay | Source: Ambulatory Visit | Attending: Pulmonary Disease | Admitting: Pulmonary Disease

## 2012-12-11 ENCOUNTER — Encounter (HOSPITAL_COMMUNITY)
Admission: RE | Admit: 2012-12-11 | Discharge: 2012-12-11 | Disposition: A | Discharge status: Home / Self Care | Payer: Self-pay | Source: Ambulatory Visit | Attending: Pulmonary Disease | Admitting: Pulmonary Disease

## 2012-12-11 IMAGING — CR DG CHEST 2V
2 series · 2 of 2 positions shown · non-contrast
Comparison: 10/12/2011, 12/31/2011

CLINICAL DATA: Restrictive lung disease, pneumonia

CHEST - 2 VIEW

[view not recorded (1 of 2)]
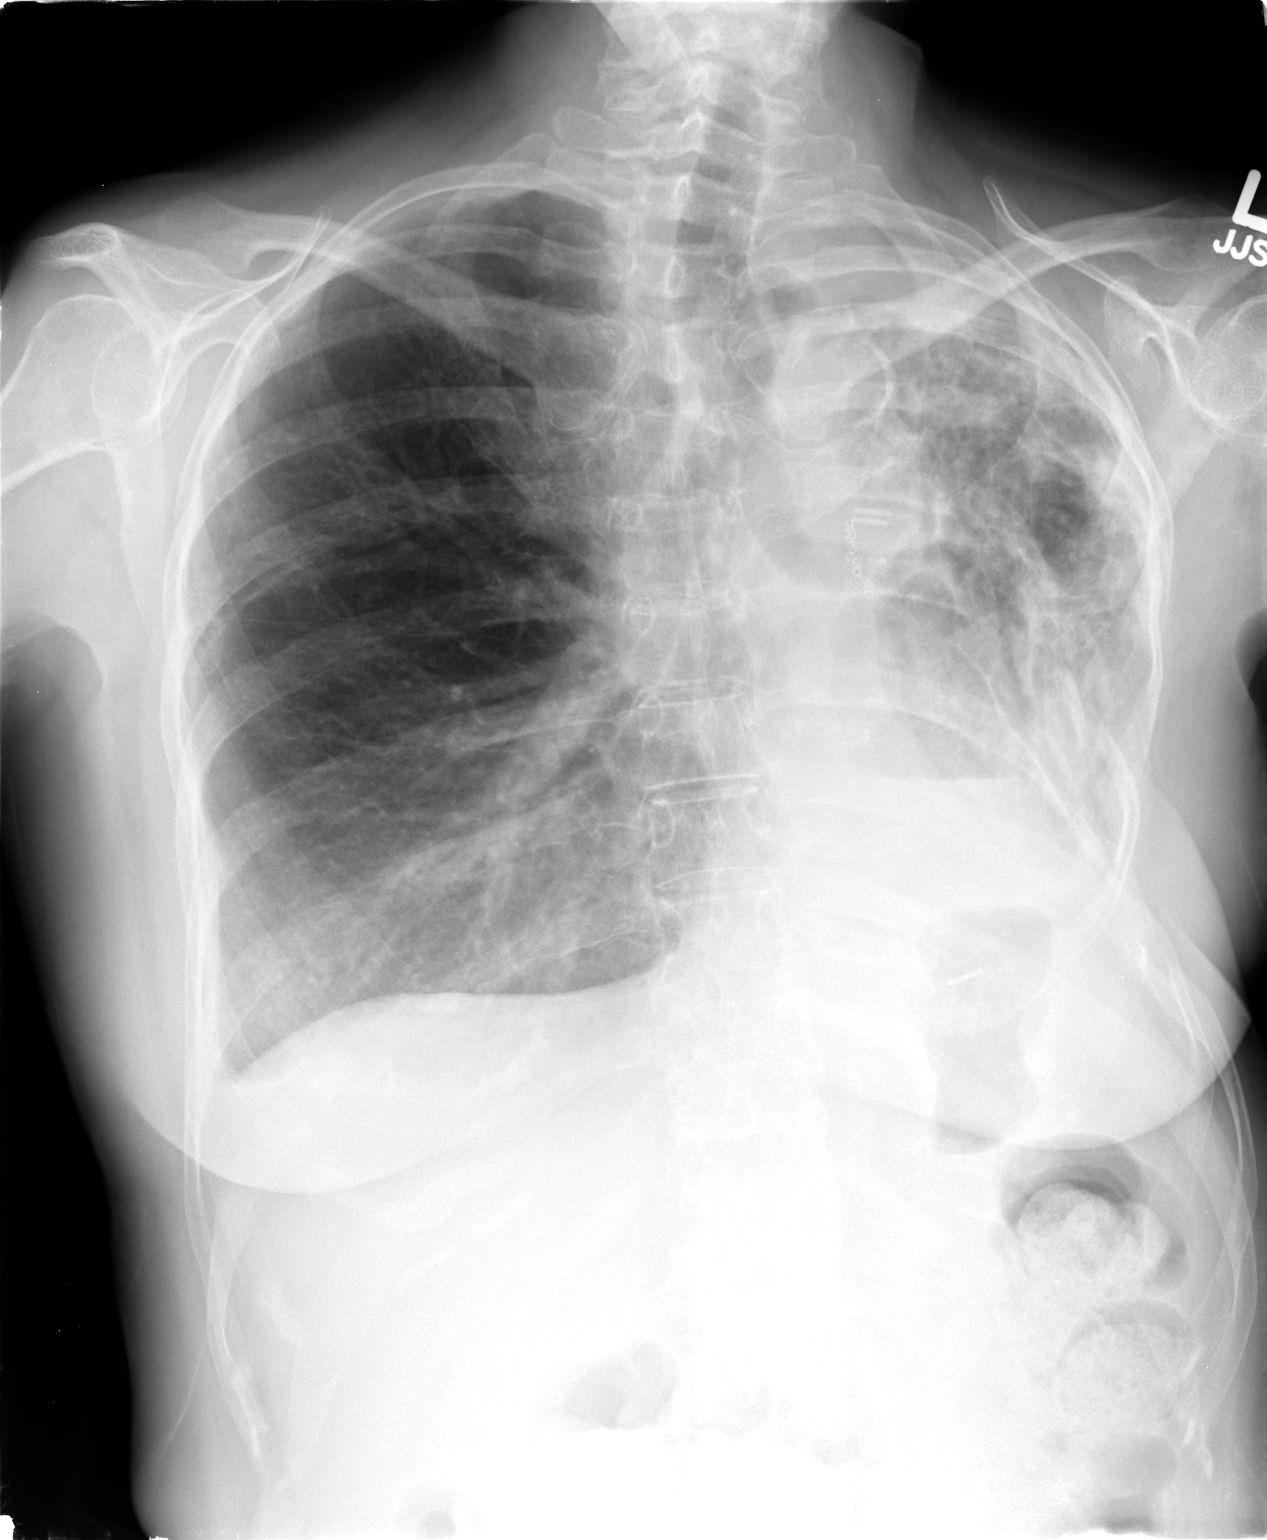

[view not recorded (2 of 2)]
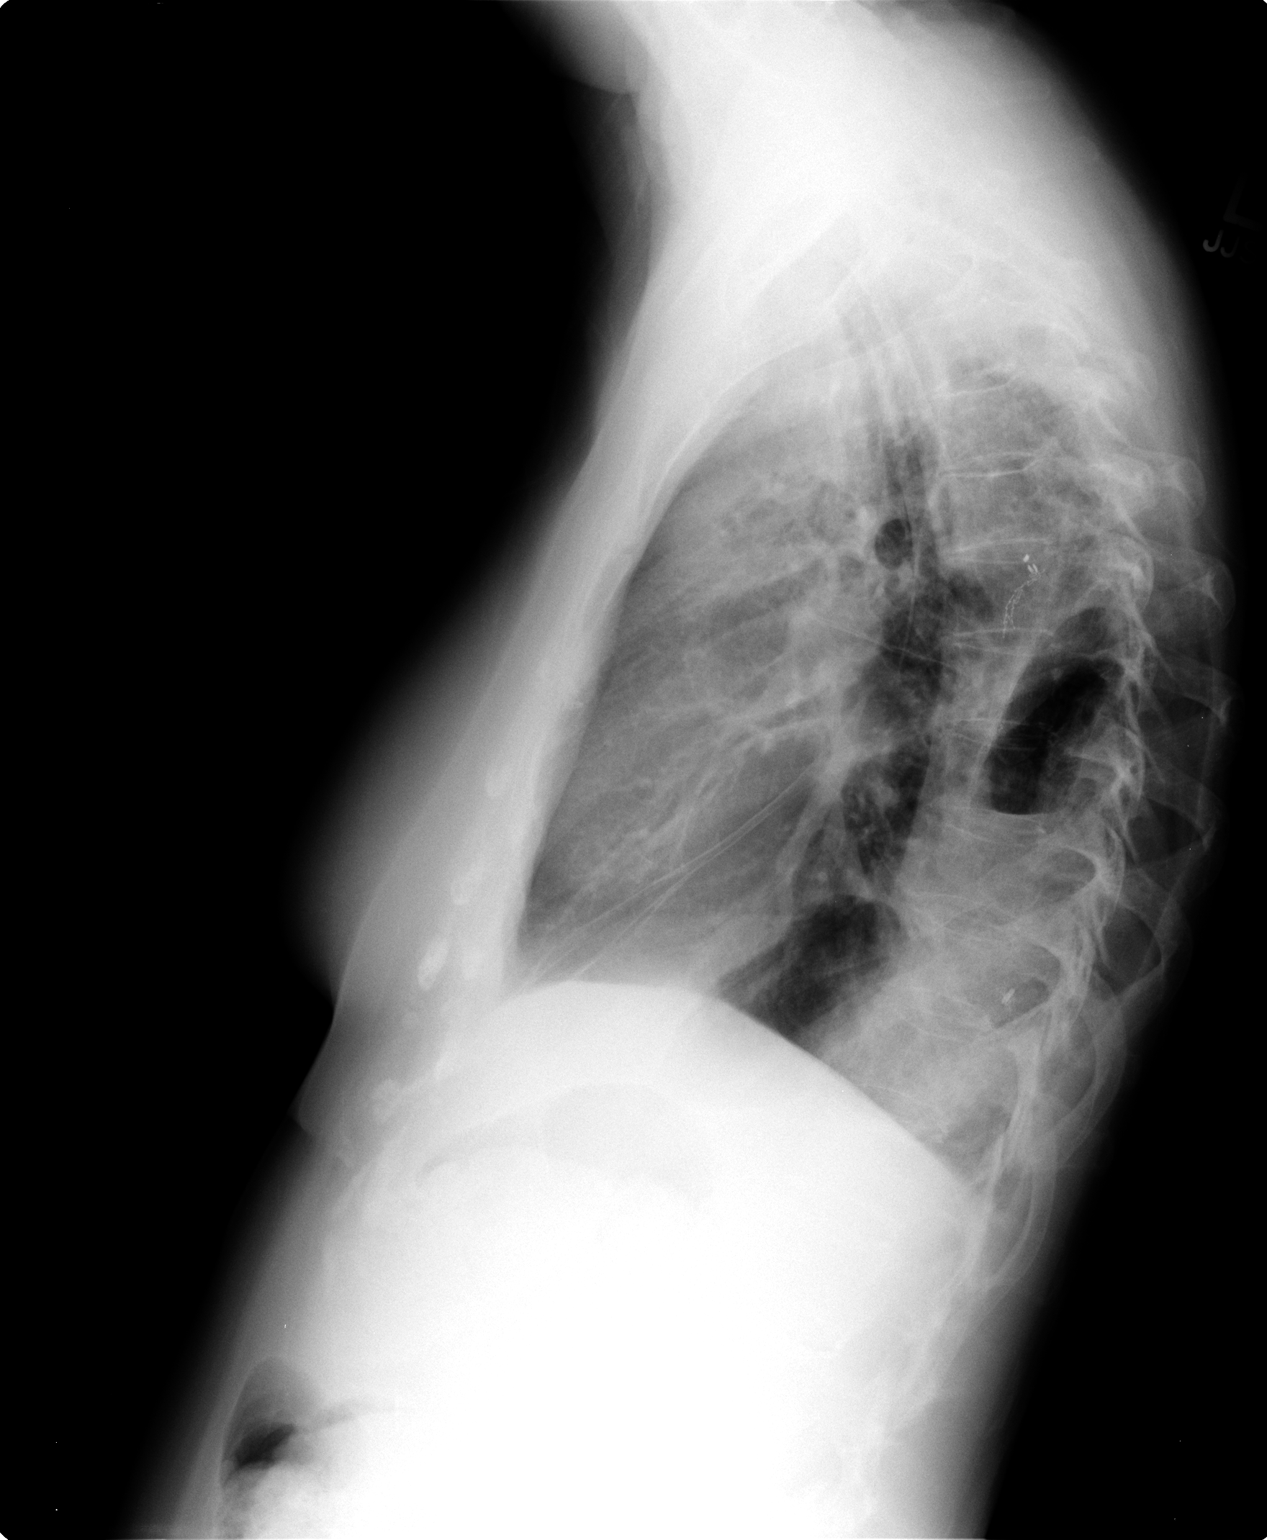

[2 of 2 positions shown; findings below may reference images not displayed]

FINDINGS: Chronic postop changes and deformity of the left chest.
Increased parenchymal opacity in the left upper chest could
represent superimposed left upper lobe pneumonia.  Resolved right
upper lobe peripheral airspace disease/opacity.  Stable right lung
aeration.
IMPRESSION: Improving right upper lobe peripheral infiltrate.

Worsening left upper lobe airspace opacity suspicious for pneumonia
superimposed on chronic left hemithorax postoperative changes and
scarring.

## 2012-12-13 ENCOUNTER — Telehealth (HOSPITAL_COMMUNITY): Payer: Self-pay | Admitting: *Deleted

## 2012-12-13 ENCOUNTER — Encounter (HOSPITAL_COMMUNITY): Admission: RE | Admit: 2012-12-13 | Payer: BC Managed Care – PPO | Source: Ambulatory Visit

## 2012-12-13 NOTE — Telephone Encounter (Signed)
Pt will be absent today from pulmonary maintenance due to transportation issues. Pt plans to return on Friday.

## 2012-12-15 ENCOUNTER — Encounter (HOSPITAL_COMMUNITY)
Admission: RE | Admit: 2012-12-15 | Discharge: 2012-12-15 | Disposition: A | Discharge status: Home / Self Care | Payer: Self-pay | Source: Ambulatory Visit | Attending: Pulmonary Disease | Admitting: Pulmonary Disease

## 2012-12-18 ENCOUNTER — Encounter (HOSPITAL_COMMUNITY)
Admission: RE | Admit: 2012-12-18 | Discharge: 2012-12-18 | Disposition: A | Discharge status: Home / Self Care | Payer: Self-pay | Source: Ambulatory Visit | Attending: Pulmonary Disease | Admitting: Pulmonary Disease

## 2012-12-20 ENCOUNTER — Encounter (HOSPITAL_COMMUNITY)
Admission: RE | Admit: 2012-12-20 | Discharge: 2012-12-20 | Disposition: A | Discharge status: Home / Self Care | Payer: Self-pay | Source: Ambulatory Visit | Attending: Pulmonary Disease | Admitting: Pulmonary Disease

## 2012-12-22 ENCOUNTER — Encounter (HOSPITAL_COMMUNITY)
Admission: RE | Admit: 2012-12-22 | Discharge: 2012-12-22 | Disposition: A | Discharge status: Home / Self Care | Payer: Self-pay | Source: Ambulatory Visit | Attending: Pulmonary Disease | Admitting: Pulmonary Disease

## 2012-12-25 ENCOUNTER — Encounter (HOSPITAL_COMMUNITY)
Admission: RE | Admit: 2012-12-25 | Discharge: 2012-12-25 | Disposition: A | Discharge status: Home / Self Care | Payer: Self-pay | Source: Ambulatory Visit | Attending: Internal Medicine | Admitting: Internal Medicine

## 2012-12-25 DIAGNOSIS — D649 Anemia, unspecified: Secondary | ICD-10-CM | POA: Insufficient documentation

## 2012-12-27 ENCOUNTER — Encounter (HOSPITAL_COMMUNITY)
Admission: RE | Admit: 2012-12-27 | Discharge: 2012-12-27 | Disposition: A | Discharge status: Home / Self Care | Payer: Self-pay | Source: Ambulatory Visit | Attending: Pulmonary Disease | Admitting: Pulmonary Disease

## 2012-12-29 ENCOUNTER — Encounter (HOSPITAL_COMMUNITY)
Admission: RE | Admit: 2012-12-29 | Discharge: 2012-12-29 | Disposition: A | Discharge status: Home / Self Care | Payer: Self-pay | Source: Ambulatory Visit | Attending: Pulmonary Disease | Admitting: Pulmonary Disease

## 2013-01-01 ENCOUNTER — Encounter (HOSPITAL_COMMUNITY)
Admission: RE | Admit: 2013-01-01 | Discharge: 2013-01-01 | Disposition: A | Discharge status: Home / Self Care | Payer: Self-pay | Source: Ambulatory Visit | Attending: Pulmonary Disease | Admitting: Pulmonary Disease

## 2013-01-03 ENCOUNTER — Encounter (HOSPITAL_COMMUNITY)
Admission: RE | Admit: 2013-01-03 | Discharge: 2013-01-03 | Disposition: A | Discharge status: Home / Self Care | Payer: Self-pay | Source: Ambulatory Visit | Attending: Pulmonary Disease | Admitting: Pulmonary Disease

## 2013-01-05 ENCOUNTER — Encounter (HOSPITAL_COMMUNITY)
Admission: RE | Admit: 2013-01-05 | Discharge: 2013-01-05 | Disposition: A | Discharge status: Home / Self Care | Payer: Self-pay | Source: Ambulatory Visit | Attending: Pulmonary Disease | Admitting: Pulmonary Disease

## 2013-01-06 IMAGING — CR DG CHEST 2V
2 series · 2 of 2 positions shown · non-contrast
Comparison: 01/19/2012 and earlier studies

CLINICAL DATA: Pneumonia

CHEST - 2 VIEW

[view not recorded (1 of 2)]
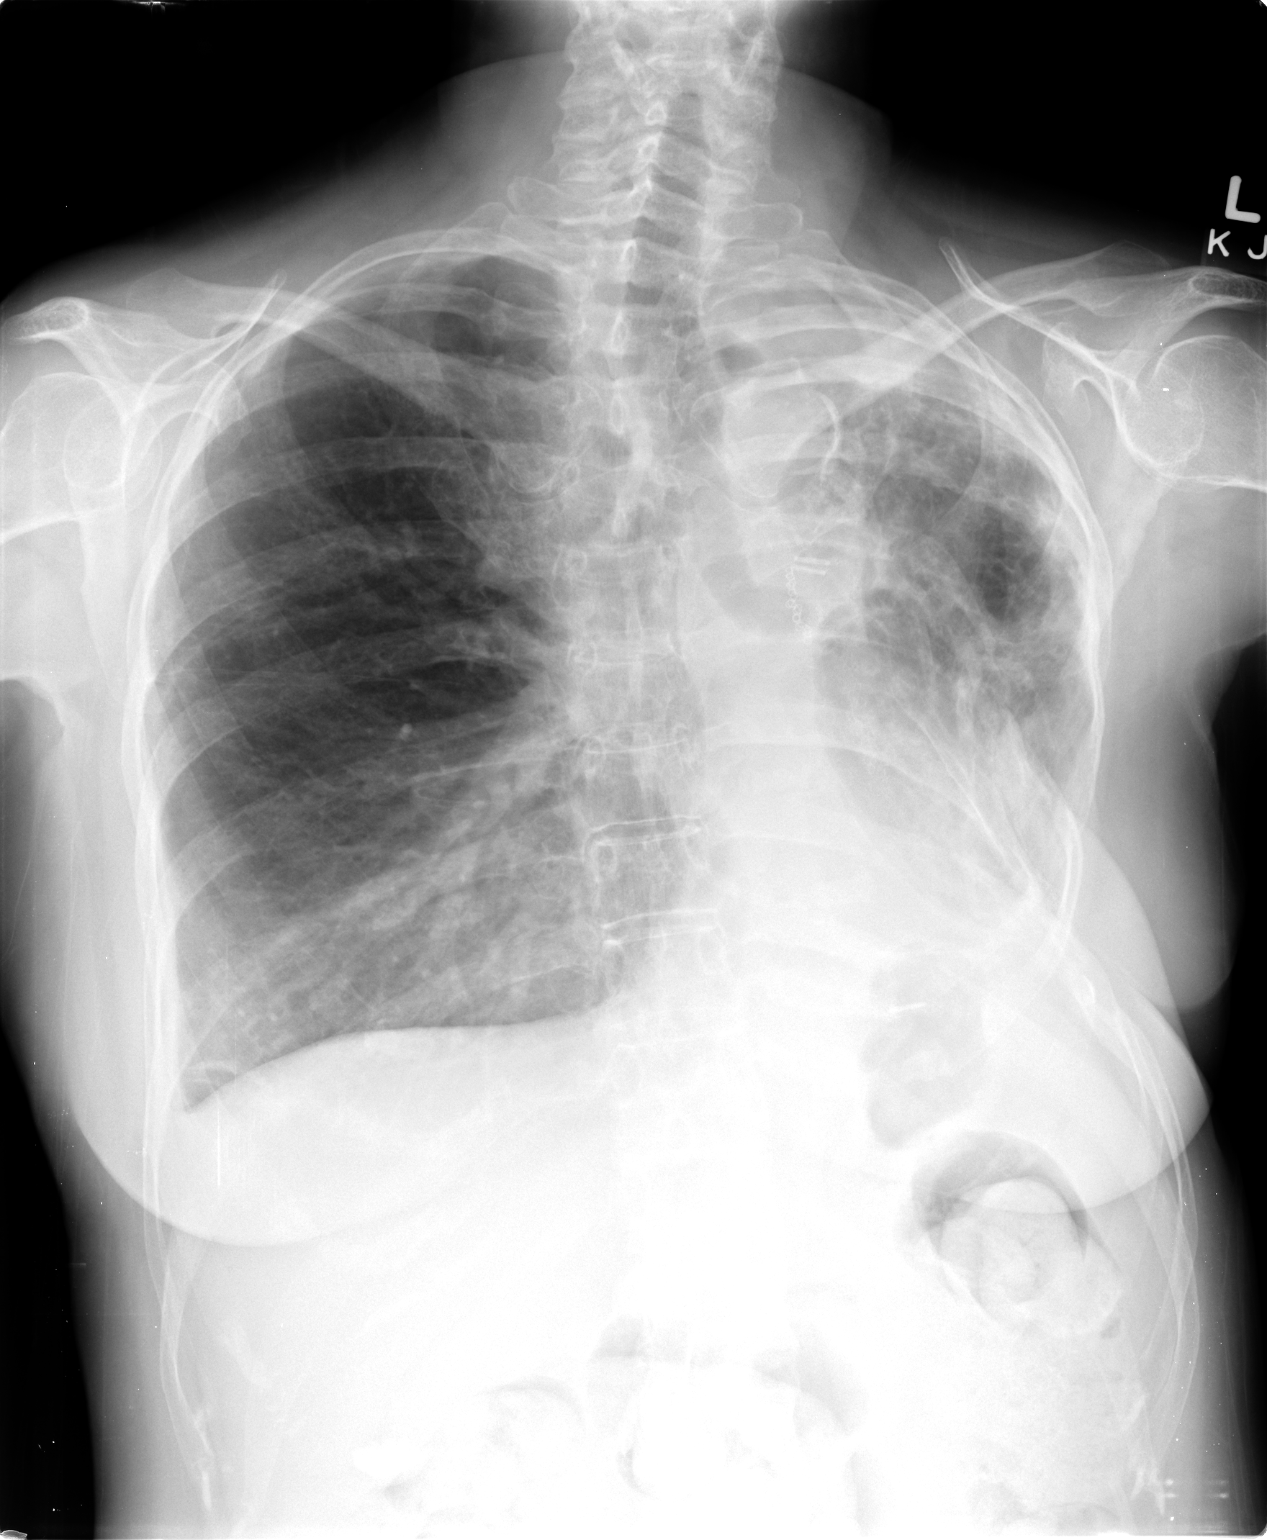

[view not recorded (2 of 2)]
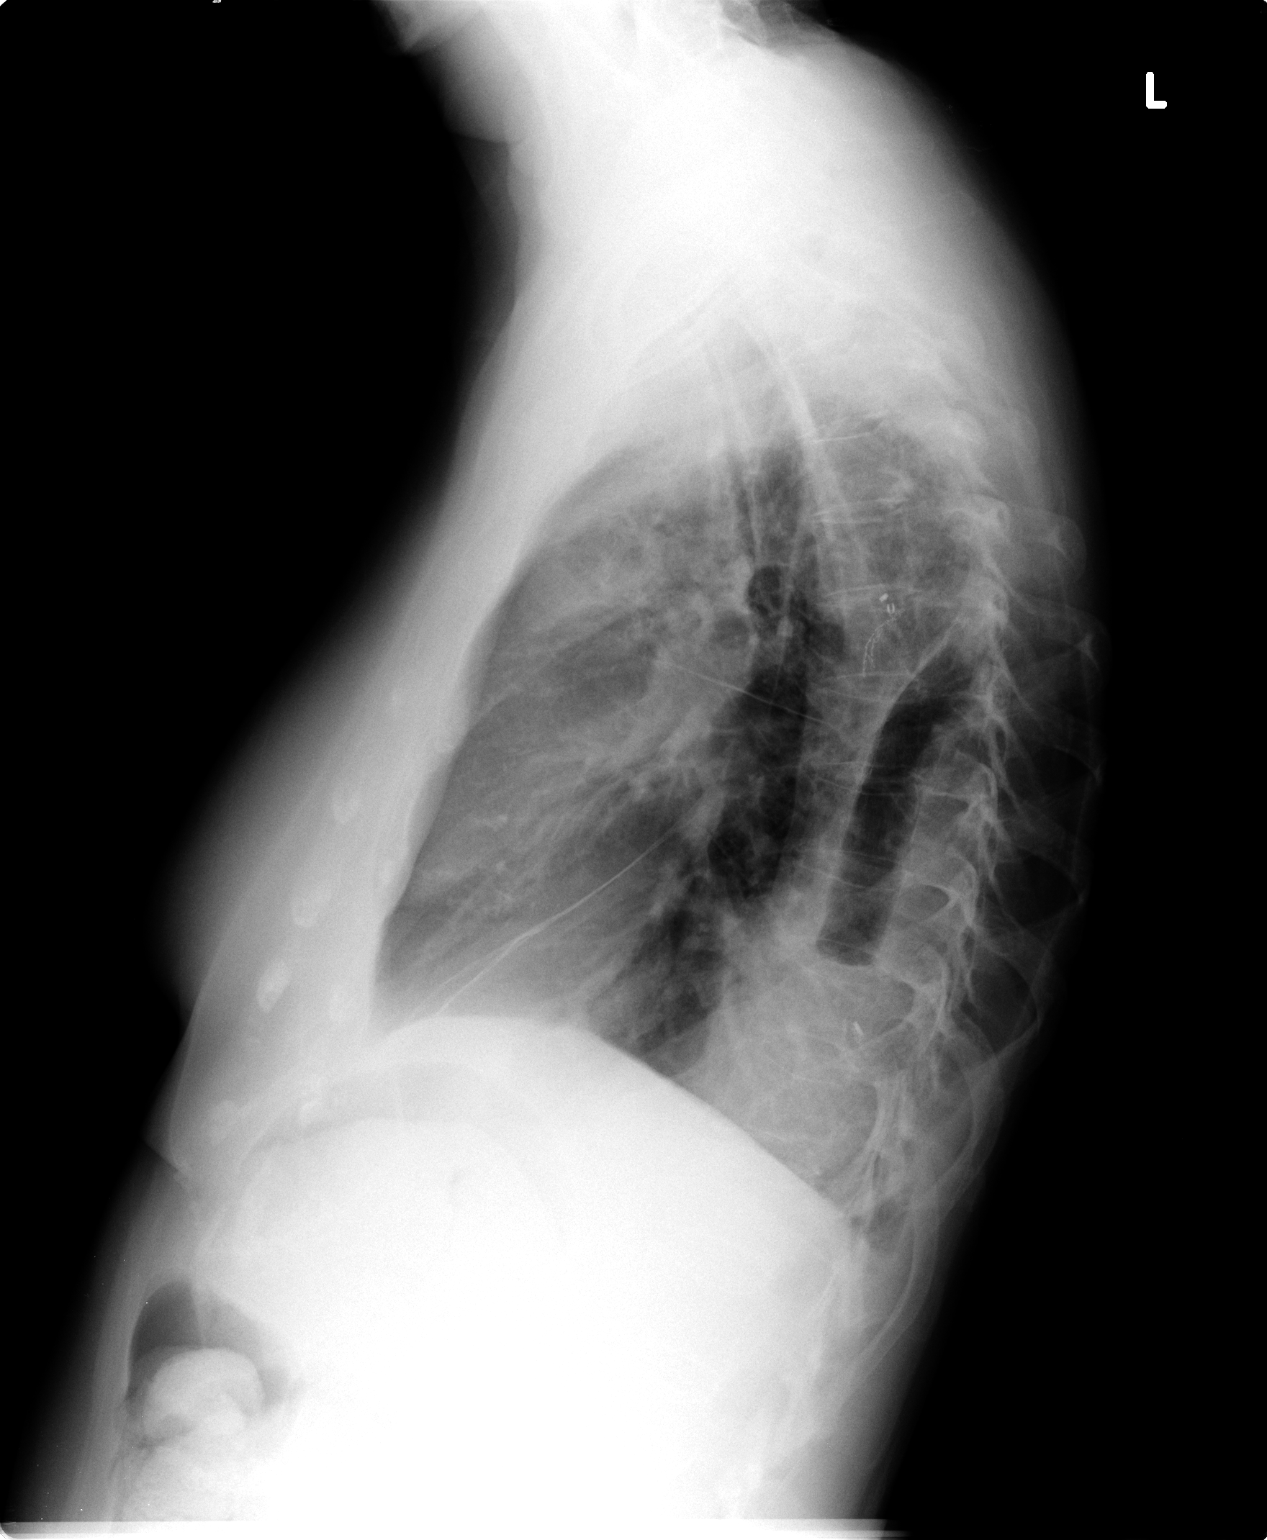

[2 of 2 positions shown; findings below may reference images not displayed]

FINDINGS: Staple line in the posterior left upper lobe.  Chronic
left apical pleuroparenchymal scarring.  Fluid level posteriorly in
the left hemithorax probably loculated hydropneumothorax, as
previously noted.  There is volume loss on the left with stable
leftward deviation of the mediastinum. Opacities at the left lung
base stable.  Mild interstitial prominence in the mid and lower
right lung without focal infiltrate or effusion.  Heart size
difficult to assess due to adjacent opacities and mediastinal
shift.
IMPRESSION: 1.  Little change in left loculated hydropneumothorax, pleural and
parenchymal opacities.

## 2013-01-08 ENCOUNTER — Encounter (HOSPITAL_COMMUNITY)
Admission: RE | Admit: 2013-01-08 | Discharge: 2013-01-08 | Disposition: A | Discharge status: Home / Self Care | Payer: Self-pay | Source: Ambulatory Visit | Attending: Pulmonary Disease | Admitting: Pulmonary Disease

## 2013-01-10 ENCOUNTER — Encounter (HOSPITAL_COMMUNITY)
Admission: RE | Admit: 2013-01-10 | Discharge: 2013-01-10 | Disposition: A | Discharge status: Home / Self Care | Payer: Self-pay | Source: Ambulatory Visit | Attending: Pulmonary Disease | Admitting: Pulmonary Disease

## 2013-01-12 ENCOUNTER — Encounter (HOSPITAL_COMMUNITY)
Admission: RE | Admit: 2013-01-12 | Discharge: 2013-01-12 | Disposition: A | Discharge status: Home / Self Care | Payer: Self-pay | Source: Ambulatory Visit | Attending: Pulmonary Disease | Admitting: Pulmonary Disease

## 2013-01-15 ENCOUNTER — Encounter (HOSPITAL_COMMUNITY)
Admission: RE | Admit: 2013-01-15 | Discharge: 2013-01-15 | Disposition: A | Discharge status: Home / Self Care | Payer: Self-pay | Source: Ambulatory Visit | Attending: Pulmonary Disease | Admitting: Pulmonary Disease

## 2013-01-17 ENCOUNTER — Encounter (HOSPITAL_COMMUNITY)
Admission: RE | Admit: 2013-01-17 | Discharge: 2013-01-17 | Disposition: A | Discharge status: Home / Self Care | Payer: Self-pay | Source: Ambulatory Visit | Attending: Pulmonary Disease | Admitting: Pulmonary Disease

## 2013-01-19 ENCOUNTER — Encounter (HOSPITAL_COMMUNITY): Payer: BC Managed Care – PPO

## 2013-01-22 ENCOUNTER — Encounter (HOSPITAL_COMMUNITY)
Admission: RE | Admit: 2013-01-22 | Discharge: 2013-01-22 | Disposition: A | Discharge status: Home / Self Care | Payer: Self-pay | Source: Ambulatory Visit | Attending: Internal Medicine | Admitting: Internal Medicine

## 2013-01-22 DIAGNOSIS — D649 Anemia, unspecified: Secondary | ICD-10-CM | POA: Insufficient documentation

## 2013-01-24 ENCOUNTER — Encounter (HOSPITAL_COMMUNITY)
Admission: RE | Admit: 2013-01-24 | Discharge: 2013-01-24 | Disposition: A | Discharge status: Home / Self Care | Payer: Self-pay | Source: Ambulatory Visit | Attending: Pulmonary Disease | Admitting: Pulmonary Disease

## 2013-01-26 ENCOUNTER — Encounter (HOSPITAL_COMMUNITY)
Admission: RE | Admit: 2013-01-26 | Discharge: 2013-01-26 | Disposition: A | Discharge status: Home / Self Care | Payer: Self-pay | Source: Ambulatory Visit | Attending: Pulmonary Disease | Admitting: Pulmonary Disease

## 2013-01-29 ENCOUNTER — Encounter (HOSPITAL_COMMUNITY)
Admission: RE | Admit: 2013-01-29 | Discharge: 2013-01-29 | Disposition: A | Discharge status: Home / Self Care | Payer: Self-pay | Source: Ambulatory Visit | Attending: Pulmonary Disease | Admitting: Pulmonary Disease

## 2013-01-31 ENCOUNTER — Encounter (HOSPITAL_COMMUNITY)
Admission: RE | Admit: 2013-01-31 | Discharge: 2013-01-31 | Disposition: A | Discharge status: Home / Self Care | Payer: Self-pay | Source: Ambulatory Visit | Attending: Pulmonary Disease | Admitting: Pulmonary Disease

## 2013-02-02 ENCOUNTER — Encounter (HOSPITAL_COMMUNITY)
Admission: RE | Admit: 2013-02-02 | Discharge: 2013-02-02 | Disposition: A | Discharge status: Home / Self Care | Payer: Self-pay | Source: Ambulatory Visit | Attending: Pulmonary Disease | Admitting: Pulmonary Disease

## 2013-02-05 ENCOUNTER — Encounter (HOSPITAL_COMMUNITY)
Admission: RE | Admit: 2013-02-05 | Discharge: 2013-02-05 | Disposition: A | Discharge status: Home / Self Care | Payer: Self-pay | Source: Ambulatory Visit | Attending: Pulmonary Disease | Admitting: Pulmonary Disease

## 2013-02-07 ENCOUNTER — Encounter (HOSPITAL_COMMUNITY)
Admission: RE | Admit: 2013-02-07 | Discharge: 2013-02-07 | Disposition: A | Discharge status: Home / Self Care | Payer: Self-pay | Source: Ambulatory Visit | Attending: Pulmonary Disease | Admitting: Pulmonary Disease

## 2013-02-09 ENCOUNTER — Encounter (HOSPITAL_COMMUNITY)
Admission: RE | Admit: 2013-02-09 | Discharge: 2013-02-09 | Disposition: A | Discharge status: Home / Self Care | Payer: Self-pay | Source: Ambulatory Visit | Attending: Pulmonary Disease | Admitting: Pulmonary Disease

## 2013-02-12 ENCOUNTER — Encounter (HOSPITAL_COMMUNITY)
Admission: RE | Admit: 2013-02-12 | Discharge: 2013-02-12 | Disposition: A | Discharge status: Home / Self Care | Payer: Self-pay | Source: Ambulatory Visit | Attending: Pulmonary Disease | Admitting: Pulmonary Disease

## 2013-02-14 ENCOUNTER — Encounter (HOSPITAL_COMMUNITY)
Admission: RE | Admit: 2013-02-14 | Discharge: 2013-02-14 | Disposition: A | Discharge status: Home / Self Care | Payer: Self-pay | Source: Ambulatory Visit | Attending: Pulmonary Disease | Admitting: Pulmonary Disease

## 2013-02-16 ENCOUNTER — Encounter (HOSPITAL_COMMUNITY): Payer: BC Managed Care – PPO

## 2013-02-19 ENCOUNTER — Encounter (HOSPITAL_COMMUNITY)
Admission: RE | Admit: 2013-02-19 | Discharge: 2013-02-19 | Disposition: A | Discharge status: Home / Self Care | Payer: Self-pay | Source: Ambulatory Visit | Attending: Pulmonary Disease | Admitting: Pulmonary Disease

## 2013-02-21 ENCOUNTER — Encounter (HOSPITAL_COMMUNITY): Payer: BC Managed Care – PPO

## 2013-02-23 ENCOUNTER — Encounter (HOSPITAL_COMMUNITY)
Admission: RE | Admit: 2013-02-23 | Discharge: 2013-02-23 | Disposition: A | Discharge status: Home / Self Care | Payer: BC Managed Care – PPO | Source: Ambulatory Visit | Attending: Internal Medicine | Admitting: Internal Medicine

## 2013-02-23 DIAGNOSIS — D649 Anemia, unspecified: Secondary | ICD-10-CM | POA: Insufficient documentation

## 2013-02-26 ENCOUNTER — Encounter (HOSPITAL_COMMUNITY): Payer: BC Managed Care – PPO

## 2013-02-27 ENCOUNTER — Ambulatory Visit (INDEPENDENT_AMBULATORY_CARE_PROVIDER_SITE_OTHER): Payer: BC Managed Care – PPO | Admitting: Pulmonary Disease

## 2013-02-27 ENCOUNTER — Encounter: Payer: Self-pay | Admitting: Pulmonary Disease

## 2013-02-27 VITALS — BP 142/80 | HR 107 | Temp 98.1°F | Ht 65.0 in | Wt 126.4 lb

## 2013-02-27 DIAGNOSIS — J45909 Unspecified asthma, uncomplicated: Secondary | ICD-10-CM

## 2013-02-27 DIAGNOSIS — J984 Other disorders of lung: Secondary | ICD-10-CM

## 2013-02-27 DIAGNOSIS — J479 Bronchiectasis, uncomplicated: Secondary | ICD-10-CM

## 2013-02-27 NOTE — Patient Instructions (Signed)
No change in medications Continue with strengthening in rehab, push nutrition with protein intake. followup with me again in 14mo.

## 2013-02-27 NOTE — Assessment & Plan Note (Signed)
The patient has had no issues with acute bronchospasm or worsening dyspnea on exertion. She is staying on her LABA/ICS compliantly.

## 2013-02-27 NOTE — Progress Notes (Signed)
   Subjective:    Patient ID: Deborah [name], female    DOB: 1950/09/22, 63 y.o.   MRN: [id number]  HPI Patient comes in today for followup of her known bronchiectasis and secondary reactive airways disease. She also has restrictive physiology on the basis of chest wall restriction. She has done very well since the last visit, and continues to participate in rehabilitation. She has not had an acute flare since her last visit with me. Nor has she had overused her rescue inhaler. Of note, the patient's weight is up 2 pounds since last visit.   Review of Systems  Constitutional: Negative for fever and unexpected weight change.  HENT: Negative for congestion, dental problem, ear pain, nosebleeds, postnasal drip, rhinorrhea, sinus pressure, sneezing, sore throat and trouble swallowing.   Eyes: Negative for redness and itching.  Respiratory: Negative for cough, chest tightness, shortness of breath and wheezing.   Cardiovascular: Negative for palpitations and leg swelling.  Gastrointestinal: Negative for nausea and vomiting.  Genitourinary: Negative for dysuria.  Musculoskeletal: Negative for joint swelling.  Skin: Negative for rash.  Neurological: Negative for headaches.  Hematological: Does not bruise/bleed easily.  Psychiatric/Behavioral: Negative for dysphoric mood. The patient is not nervous/anxious.        Objective:   Physical Exam  Thin female in no acute distress Nose without purulence or discharge noted Neck without lymphadenopathy or thyromegaly Chest with a few scattered crackles left greater than right, no wheezing Cardiac exam with regular rate and rhythm Lower extremities without edema, no cyanosis Alert and oriented, moves all 4 extremities.       Assessment & Plan:

## 2013-02-27 NOTE — Assessment & Plan Note (Signed)
The patient appears to be doing very well from a bronchiectasis standpoint. She has not required a round of antibiotics since the last visit, and does not have a lot of mucus or congestion at this time. We will continue to follow her closely.

## 2013-02-28 ENCOUNTER — Encounter (HOSPITAL_COMMUNITY): Payer: BC Managed Care – PPO

## 2013-03-02 ENCOUNTER — Encounter (HOSPITAL_COMMUNITY): Payer: BC Managed Care – PPO

## 2013-03-05 ENCOUNTER — Encounter (HOSPITAL_COMMUNITY)
Admission: RE | Admit: 2013-03-05 | Discharge: 2013-03-05 | Disposition: A | Discharge status: Home / Self Care | Payer: Self-pay | Source: Ambulatory Visit | Attending: Pulmonary Disease | Admitting: Pulmonary Disease

## 2013-03-07 ENCOUNTER — Encounter (HOSPITAL_COMMUNITY)
Admission: RE | Admit: 2013-03-07 | Discharge: 2013-03-07 | Disposition: A | Discharge status: Home / Self Care | Payer: Self-pay | Source: Ambulatory Visit | Attending: Pulmonary Disease | Admitting: Pulmonary Disease

## 2013-03-09 ENCOUNTER — Encounter (HOSPITAL_COMMUNITY)
Admission: RE | Admit: 2013-03-09 | Discharge: 2013-03-09 | Disposition: A | Discharge status: Home / Self Care | Payer: Self-pay | Source: Ambulatory Visit | Attending: Pulmonary Disease | Admitting: Pulmonary Disease

## 2013-03-12 ENCOUNTER — Encounter (HOSPITAL_COMMUNITY)
Admission: RE | Admit: 2013-03-12 | Discharge: 2013-03-12 | Disposition: A | Discharge status: Home / Self Care | Payer: Self-pay | Source: Ambulatory Visit | Attending: Pulmonary Disease | Admitting: Pulmonary Disease

## 2013-03-14 ENCOUNTER — Encounter (HOSPITAL_COMMUNITY)
Admission: RE | Admit: 2013-03-14 | Discharge: 2013-03-14 | Disposition: A | Discharge status: Home / Self Care | Payer: Self-pay | Source: Ambulatory Visit | Attending: Pulmonary Disease | Admitting: Pulmonary Disease

## 2013-03-16 ENCOUNTER — Encounter (HOSPITAL_COMMUNITY)
Admission: RE | Admit: 2013-03-16 | Discharge: 2013-03-16 | Disposition: A | Discharge status: Home / Self Care | Payer: Self-pay | Source: Ambulatory Visit | Attending: Pulmonary Disease | Admitting: Pulmonary Disease

## 2013-03-19 ENCOUNTER — Encounter (HOSPITAL_COMMUNITY)
Admission: RE | Admit: 2013-03-19 | Discharge: 2013-03-19 | Disposition: A | Discharge status: Home / Self Care | Payer: Self-pay | Source: Ambulatory Visit | Attending: Pulmonary Disease | Admitting: Pulmonary Disease

## 2013-03-21 ENCOUNTER — Encounter (HOSPITAL_COMMUNITY)
Admission: RE | Admit: 2013-03-21 | Discharge: 2013-03-21 | Disposition: A | Discharge status: Home / Self Care | Payer: Self-pay | Source: Ambulatory Visit | Attending: Pulmonary Disease | Admitting: Pulmonary Disease

## 2013-03-23 ENCOUNTER — Encounter (HOSPITAL_COMMUNITY)
Admission: RE | Admit: 2013-03-23 | Discharge: 2013-03-23 | Disposition: A | Discharge status: Home / Self Care | Payer: Self-pay | Source: Ambulatory Visit | Attending: Pulmonary Disease | Admitting: Pulmonary Disease

## 2013-03-26 ENCOUNTER — Encounter (HOSPITAL_COMMUNITY)
Admission: RE | Admit: 2013-03-26 | Discharge: 2013-03-26 | Disposition: A | Discharge status: Home / Self Care | Payer: Self-pay | Source: Ambulatory Visit | Attending: Internal Medicine | Admitting: Internal Medicine

## 2013-03-26 DIAGNOSIS — D649 Anemia, unspecified: Secondary | ICD-10-CM | POA: Insufficient documentation

## 2013-03-28 ENCOUNTER — Encounter (HOSPITAL_COMMUNITY)
Admission: RE | Admit: 2013-03-28 | Discharge: 2013-03-28 | Disposition: A | Discharge status: Home / Self Care | Payer: Self-pay | Source: Ambulatory Visit | Attending: Pulmonary Disease | Admitting: Pulmonary Disease

## 2013-03-29 ENCOUNTER — Other Ambulatory Visit: Payer: Self-pay | Admitting: Dermatology

## 2013-03-30 ENCOUNTER — Encounter (HOSPITAL_COMMUNITY): Payer: Self-pay

## 2013-04-02 ENCOUNTER — Encounter (HOSPITAL_COMMUNITY)
Admission: RE | Admit: 2013-04-02 | Discharge: 2013-04-02 | Disposition: A | Discharge status: Home / Self Care | Payer: Self-pay | Source: Ambulatory Visit | Attending: Pulmonary Disease | Admitting: Pulmonary Disease

## 2013-04-04 ENCOUNTER — Encounter (HOSPITAL_COMMUNITY)
Admission: RE | Admit: 2013-04-04 | Discharge: 2013-04-04 | Disposition: A | Discharge status: Home / Self Care | Payer: Self-pay | Source: Ambulatory Visit | Attending: Pulmonary Disease | Admitting: Pulmonary Disease

## 2013-04-06 ENCOUNTER — Encounter (HOSPITAL_COMMUNITY)
Admission: RE | Admit: 2013-04-06 | Discharge: 2013-04-06 | Disposition: A | Discharge status: Home / Self Care | Payer: Self-pay | Source: Ambulatory Visit | Attending: Pulmonary Disease | Admitting: Pulmonary Disease

## 2013-04-09 ENCOUNTER — Encounter (HOSPITAL_COMMUNITY)
Admission: RE | Admit: 2013-04-09 | Discharge: 2013-04-09 | Disposition: A | Discharge status: Home / Self Care | Payer: Self-pay | Source: Ambulatory Visit | Attending: Pulmonary Disease | Admitting: Pulmonary Disease

## 2013-04-11 ENCOUNTER — Encounter (HOSPITAL_COMMUNITY)
Admission: RE | Admit: 2013-04-11 | Discharge: 2013-04-11 | Disposition: A | Discharge status: Home / Self Care | Payer: Self-pay | Source: Ambulatory Visit | Attending: Pulmonary Disease | Admitting: Pulmonary Disease

## 2013-04-13 ENCOUNTER — Encounter (HOSPITAL_COMMUNITY)
Admission: RE | Admit: 2013-04-13 | Discharge: 2013-04-13 | Disposition: A | Discharge status: Home / Self Care | Payer: Self-pay | Source: Ambulatory Visit | Attending: Pulmonary Disease | Admitting: Pulmonary Disease

## 2013-04-16 ENCOUNTER — Encounter (HOSPITAL_COMMUNITY)
Admission: RE | Admit: 2013-04-16 | Discharge: 2013-04-16 | Disposition: A | Discharge status: Home / Self Care | Payer: Self-pay | Source: Ambulatory Visit | Attending: Pulmonary Disease | Admitting: Pulmonary Disease

## 2013-04-18 ENCOUNTER — Encounter (HOSPITAL_COMMUNITY)
Admission: RE | Admit: 2013-04-18 | Discharge: 2013-04-18 | Disposition: A | Discharge status: Home / Self Care | Payer: Self-pay | Source: Ambulatory Visit | Attending: Pulmonary Disease | Admitting: Pulmonary Disease

## 2013-04-20 ENCOUNTER — Encounter (HOSPITAL_COMMUNITY)
Admission: RE | Admit: 2013-04-20 | Discharge: 2013-04-20 | Disposition: A | Discharge status: Home / Self Care | Payer: Self-pay | Source: Ambulatory Visit | Attending: Pulmonary Disease | Admitting: Pulmonary Disease

## 2013-04-23 ENCOUNTER — Encounter (HOSPITAL_COMMUNITY)
Admission: RE | Admit: 2013-04-23 | Discharge: 2013-04-23 | Disposition: A | Discharge status: Home / Self Care | Payer: Self-pay | Source: Ambulatory Visit | Attending: Internal Medicine | Admitting: Internal Medicine

## 2013-04-23 DIAGNOSIS — D649 Anemia, unspecified: Secondary | ICD-10-CM | POA: Insufficient documentation

## 2013-04-25 ENCOUNTER — Encounter (HOSPITAL_COMMUNITY)
Admission: RE | Admit: 2013-04-25 | Discharge: 2013-04-25 | Disposition: A | Discharge status: Home / Self Care | Payer: Self-pay | Source: Ambulatory Visit | Attending: Internal Medicine | Admitting: Internal Medicine

## 2013-04-27 ENCOUNTER — Encounter (HOSPITAL_COMMUNITY)
Admission: RE | Admit: 2013-04-27 | Discharge: 2013-04-27 | Disposition: A | Discharge status: Home / Self Care | Payer: Self-pay | Source: Ambulatory Visit | Attending: Internal Medicine | Admitting: Internal Medicine

## 2013-04-30 ENCOUNTER — Encounter (HOSPITAL_COMMUNITY)
Admission: RE | Admit: 2013-04-30 | Discharge: 2013-04-30 | Disposition: A | Discharge status: Home / Self Care | Payer: Self-pay | Source: Ambulatory Visit | Attending: Internal Medicine | Admitting: Internal Medicine

## 2013-05-02 ENCOUNTER — Encounter (HOSPITAL_COMMUNITY)
Admission: RE | Admit: 2013-05-02 | Discharge: 2013-05-02 | Disposition: A | Discharge status: Home / Self Care | Payer: Self-pay | Source: Ambulatory Visit | Attending: Internal Medicine | Admitting: Internal Medicine

## 2013-05-04 ENCOUNTER — Encounter (HOSPITAL_COMMUNITY)
Admission: RE | Admit: 2013-05-04 | Discharge: 2013-05-04 | Disposition: A | Discharge status: Home / Self Care | Payer: Self-pay | Source: Ambulatory Visit | Attending: Internal Medicine | Admitting: Internal Medicine

## 2013-05-07 ENCOUNTER — Encounter (HOSPITAL_COMMUNITY)
Admission: RE | Admit: 2013-05-07 | Discharge: 2013-05-07 | Disposition: A | Discharge status: Home / Self Care | Payer: Self-pay | Source: Ambulatory Visit | Attending: Internal Medicine | Admitting: Internal Medicine

## 2013-05-09 ENCOUNTER — Encounter (HOSPITAL_COMMUNITY)
Admission: RE | Admit: 2013-05-09 | Discharge: 2013-05-09 | Disposition: A | Discharge status: Home / Self Care | Payer: Self-pay | Source: Ambulatory Visit | Attending: Internal Medicine | Admitting: Internal Medicine

## 2013-05-11 ENCOUNTER — Encounter (HOSPITAL_COMMUNITY)
Admission: RE | Admit: 2013-05-11 | Discharge: 2013-05-11 | Disposition: A | Discharge status: Home / Self Care | Payer: Self-pay | Source: Ambulatory Visit | Attending: Internal Medicine | Admitting: Internal Medicine

## 2013-05-14 ENCOUNTER — Encounter (HOSPITAL_COMMUNITY)
Admission: RE | Admit: 2013-05-14 | Discharge: 2013-05-14 | Disposition: A | Discharge status: Home / Self Care | Payer: Self-pay | Source: Ambulatory Visit | Attending: Internal Medicine | Admitting: Internal Medicine

## 2013-05-16 ENCOUNTER — Encounter (HOSPITAL_COMMUNITY)
Admission: RE | Admit: 2013-05-16 | Discharge: 2013-05-16 | Disposition: A | Discharge status: Home / Self Care | Payer: Self-pay | Source: Ambulatory Visit | Attending: Internal Medicine | Admitting: Internal Medicine

## 2013-05-18 ENCOUNTER — Encounter (HOSPITAL_COMMUNITY)
Admission: RE | Admit: 2013-05-18 | Discharge: 2013-05-18 | Disposition: A | Discharge status: Home / Self Care | Payer: Self-pay | Source: Ambulatory Visit | Attending: Internal Medicine | Admitting: Internal Medicine

## 2013-05-21 ENCOUNTER — Encounter (HOSPITAL_COMMUNITY)
Admission: RE | Admit: 2013-05-21 | Discharge: 2013-05-21 | Disposition: A | Discharge status: Home / Self Care | Payer: Self-pay | Source: Ambulatory Visit | Attending: Internal Medicine | Admitting: Internal Medicine

## 2013-05-23 ENCOUNTER — Encounter (HOSPITAL_COMMUNITY)
Admission: RE | Admit: 2013-05-23 | Discharge: 2013-05-23 | Disposition: A | Discharge status: Home / Self Care | Payer: Self-pay | Source: Ambulatory Visit | Attending: Internal Medicine | Admitting: Internal Medicine

## 2013-05-23 DIAGNOSIS — D649 Anemia, unspecified: Secondary | ICD-10-CM | POA: Insufficient documentation

## 2013-05-25 ENCOUNTER — Encounter (HOSPITAL_COMMUNITY)
Admission: RE | Admit: 2013-05-25 | Discharge: 2013-05-25 | Disposition: A | Discharge status: Home / Self Care | Payer: Self-pay | Source: Ambulatory Visit | Attending: Internal Medicine | Admitting: Internal Medicine

## 2013-05-28 ENCOUNTER — Encounter (HOSPITAL_COMMUNITY): Admission: RE | Admit: 2013-05-28 | Payer: Self-pay | Source: Ambulatory Visit

## 2013-05-28 ENCOUNTER — Telehealth (HOSPITAL_COMMUNITY): Payer: Self-pay | Admitting: *Deleted

## 2013-05-30 ENCOUNTER — Encounter (HOSPITAL_COMMUNITY)
Admission: RE | Admit: 2013-05-30 | Discharge: 2013-05-30 | Disposition: A | Discharge status: Home / Self Care | Payer: Self-pay | Source: Ambulatory Visit | Attending: Internal Medicine | Admitting: Internal Medicine

## 2013-06-01 ENCOUNTER — Encounter (HOSPITAL_COMMUNITY)
Admission: RE | Admit: 2013-06-01 | Discharge: 2013-06-01 | Disposition: A | Discharge status: Home / Self Care | Payer: Self-pay | Source: Ambulatory Visit | Attending: Internal Medicine | Admitting: Internal Medicine

## 2013-06-04 ENCOUNTER — Encounter (HOSPITAL_COMMUNITY)
Admission: RE | Admit: 2013-06-04 | Discharge: 2013-06-04 | Disposition: A | Discharge status: Home / Self Care | Payer: BC Managed Care – PPO | Source: Ambulatory Visit | Attending: Internal Medicine | Admitting: Internal Medicine

## 2013-06-06 ENCOUNTER — Encounter (HOSPITAL_COMMUNITY)
Admission: RE | Admit: 2013-06-06 | Discharge: 2013-06-06 | Disposition: A | Discharge status: Home / Self Care | Payer: Self-pay | Source: Ambulatory Visit | Attending: Internal Medicine | Admitting: Internal Medicine

## 2013-06-08 ENCOUNTER — Encounter (HOSPITAL_COMMUNITY)
Admission: RE | Admit: 2013-06-08 | Discharge: 2013-06-08 | Disposition: A | Discharge status: Home / Self Care | Payer: Self-pay | Source: Ambulatory Visit | Attending: Internal Medicine | Admitting: Internal Medicine

## 2013-06-11 ENCOUNTER — Encounter (HOSPITAL_COMMUNITY)
Admission: RE | Admit: 2013-06-11 | Discharge: 2013-06-11 | Disposition: A | Discharge status: Home / Self Care | Payer: Self-pay | Source: Ambulatory Visit | Attending: Internal Medicine | Admitting: Internal Medicine

## 2013-06-13 ENCOUNTER — Encounter (HOSPITAL_COMMUNITY)
Admission: RE | Admit: 2013-06-13 | Discharge: 2013-06-13 | Disposition: A | Discharge status: Home / Self Care | Payer: Self-pay | Source: Ambulatory Visit | Attending: Internal Medicine | Admitting: Internal Medicine

## 2013-06-15 ENCOUNTER — Encounter (HOSPITAL_COMMUNITY): Payer: Self-pay

## 2013-06-18 ENCOUNTER — Encounter (HOSPITAL_COMMUNITY)
Admission: RE | Admit: 2013-06-18 | Discharge: 2013-06-18 | Disposition: A | Discharge status: Home / Self Care | Payer: Self-pay | Source: Ambulatory Visit | Attending: Internal Medicine | Admitting: Internal Medicine

## 2013-06-20 ENCOUNTER — Encounter (HOSPITAL_COMMUNITY): Payer: Self-pay

## 2013-06-22 ENCOUNTER — Telehealth: Payer: Self-pay | Admitting: Pulmonary Disease

## 2013-06-22 ENCOUNTER — Encounter (HOSPITAL_COMMUNITY): Payer: Self-pay

## 2013-06-22 DIAGNOSIS — D649 Anemia, unspecified: Secondary | ICD-10-CM | POA: Insufficient documentation

## 2013-06-22 NOTE — Telephone Encounter (Signed)
Called and spoke with pt.  Has been having increasing chest congestion with cough and low grade fever.  Primary called in omnicef, and she began to have diarrhea after 2-3 days.  Primary asked her to stop, and she is feeling a little bit better (probably from abx).  She is still congested, but ok.  I have asked her to call if she gets sicker this weekend.  High risk for decompensation.  Would call in levaquin if she calls this weekend.

## 2013-06-22 NOTE — Telephone Encounter (Signed)
OV has been made with [place] on 06/27/13 at 4:30pm. Okay to use this time per [facility].  KC has called this pt and requests that I route this to him.

## 2013-06-25 ENCOUNTER — Encounter (HOSPITAL_COMMUNITY): Payer: BC Managed Care – PPO

## 2013-06-27 ENCOUNTER — Ambulatory Visit (INDEPENDENT_AMBULATORY_CARE_PROVIDER_SITE_OTHER)
Admission: RE | Admit: 2013-06-27 | Discharge: 2013-06-27 | Disposition: A | Discharge status: Home / Self Care | Payer: BC Managed Care – PPO | Source: Ambulatory Visit | Attending: Pulmonary Disease | Admitting: Pulmonary Disease

## 2013-06-27 ENCOUNTER — Ambulatory Visit: Payer: BC Managed Care – PPO | Admitting: Pulmonary Disease

## 2013-06-27 ENCOUNTER — Encounter (HOSPITAL_COMMUNITY): Payer: Self-pay

## 2013-06-27 ENCOUNTER — Ambulatory Visit (INDEPENDENT_AMBULATORY_CARE_PROVIDER_SITE_OTHER): Payer: BC Managed Care – PPO | Admitting: Pulmonary Disease

## 2013-06-27 ENCOUNTER — Encounter: Payer: Self-pay | Admitting: Pulmonary Disease

## 2013-06-27 VITALS — BP 140/80 | HR 97 | Temp 97.7°F | Ht 65.0 in | Wt 124.2 lb

## 2013-06-27 DIAGNOSIS — J479 Bronchiectasis, uncomplicated: Secondary | ICD-10-CM

## 2013-06-27 DIAGNOSIS — J45909 Unspecified asthma, uncomplicated: Secondary | ICD-10-CM

## 2013-06-27 MED ORDER — CHOLESTYRAMINE LIGHT 4 GM/DOSE PO POWD: ORAL | Status: DC

## 2013-06-27 NOTE — Addendum Note (Signed)
Addended by: Desmond Dike C on: 06/27/2013 04:37 PM   Modules accepted: Orders

## 2013-06-27 NOTE — Patient Instructions (Addendum)
No change in your breathing medications. Will check chest xray today and call you with results.  Will have you evaluated for portable oxygen concentrator.  You can cancel future apptm, and make one for 73mo.  Please call if you feel symptoms are getting worse.

## 2013-06-27 NOTE — Assessment & Plan Note (Signed)
The patient currently feels that she is improving from a pulmonary standpoint, and denies any worsening mucus production. She has had various issues during the winter that have caused her to have increased shortness of breath, and most recently was concerned about an impending chest infection. She was treated with 5 days of antibiotics which sounds like it may have helped, but she discontinued because of diarrhea. Overall, she feels that she is on the mend, and I would be hesitant to do anything different unless there was obvious evidence for some type of flareup. We'll check a chest x-ray today, as well as ambulatory oximetry. She has difficulty having her saturations checked in pulmonary rehabilitation because of her Raynauds.

## 2013-06-27 NOTE — Addendum Note (Signed)
Addended by: Lilli Few on: 06/27/2013 04:31 PM   Modules accepted: Orders

## 2013-06-27 NOTE — Progress Notes (Signed)
   Subjective:    Patient ID: Deborah [name], female    DOB: 1950/07/04, 63 y.o.   MRN: [id number]  HPI The patient comes in today for followup of her known bronchiectasis with superimposed obstructive lung disease. She has been having issues over the last few months with increasing dyspnea on exertion, as well as some mild increased chest congestion and mildly discolored mucus. She initially thought this was secondary to the cold air, and then felt that she had some type of acute viral illness because of low-grade temperatures. She recently had some increased mucus and was given Omnicef by her primary physician, but then began to have diarrhea after 5 days. This was discontinued, and she was started on Flagyl for possible C. difficile. She feels that her breathing is actually improved over the last week, and she has less congestion with decreased mucus.     Review of Systems  Constitutional: Negative for fever and unexpected weight change.  HENT: Negative for congestion, dental problem, ear pain, nosebleeds, postnasal drip, rhinorrhea, sinus pressure, sneezing, sore throat and trouble swallowing.   Eyes: Negative for redness and itching.  Respiratory: Positive for cough and shortness of breath. Negative for chest tightness and wheezing.   Cardiovascular: Negative for palpitations and leg swelling.  Gastrointestinal: Negative for nausea and vomiting.  Genitourinary: Negative for dysuria.  Musculoskeletal: Negative for joint swelling.  Skin: Negative for rash.  Neurological: Negative for headaches.  Hematological: Does not bruise/bleed easily.  Psychiatric/Behavioral: Negative for dysphoric mood. The patient is not nervous/anxious.        Objective:   Physical Exam Frail-appearing female in no acute distress Nose without purulence or discharge noted Neck without lymphadenopathy or thyromegaly Chest with pops, squeaks, and crackles left greater than right. No wheezing noted Cardiac exam  with regular rate and rhythm Lower extremities without edema, no cyanosis Alert and oriented, moves all 4 extremities.       Assessment & Plan:

## 2013-06-29 ENCOUNTER — Telehealth: Payer: Self-pay | Admitting: Pulmonary Disease

## 2013-06-29 ENCOUNTER — Encounter (HOSPITAL_COMMUNITY)
Admission: RE | Admit: 2013-06-29 | Discharge: 2013-06-29 | Disposition: A | Discharge status: Home / Self Care | Payer: Self-pay | Source: Ambulatory Visit | Attending: Internal Medicine | Admitting: Internal Medicine

## 2013-06-29 NOTE — Telephone Encounter (Signed)
ATC Molly. Had to [facility] x1

## 2013-07-02 ENCOUNTER — Encounter (HOSPITAL_COMMUNITY)
Admission: RE | Admit: 2013-07-02 | Discharge: 2013-07-02 | Disposition: A | Discharge status: Home / Self Care | Payer: Self-pay | Source: Ambulatory Visit | Attending: Internal Medicine | Admitting: Internal Medicine

## 2013-07-02 NOTE — Telephone Encounter (Signed)
Cloyde Reams Returning call

## 2013-07-02 NOTE — Telephone Encounter (Signed)
lmtcb x1 for [employer]

## 2013-07-02 NOTE — Telephone Encounter (Signed)
lmtcbx2 for [employer]. Pt desaturated in office at last OV to 85%, she will need oxygen with activity at rehab.  [place] Bing, CMA

## 2013-07-03 ENCOUNTER — Ambulatory Visit: Payer: BC Managed Care – PPO | Admitting: Pulmonary Disease

## 2013-07-03 NOTE — Telephone Encounter (Signed)
I spoke with [facility] and advised of results of test at last visit. She will begin placing the pt on 2 liters at rehab. [place] Bing, CMA

## 2013-07-04 ENCOUNTER — Encounter (HOSPITAL_COMMUNITY)
Admission: RE | Admit: 2013-07-04 | Discharge: 2013-07-04 | Disposition: A | Discharge status: Home / Self Care | Payer: Self-pay | Source: Ambulatory Visit | Attending: Internal Medicine | Admitting: Internal Medicine

## 2013-07-05 ENCOUNTER — Telehealth: Payer: Self-pay | Admitting: Pulmonary Disease

## 2013-07-05 DIAGNOSIS — J479 Bronchiectasis, uncomplicated: Secondary | ICD-10-CM

## 2013-07-05 NOTE — Telephone Encounter (Signed)
Spoke with [facility] has been handling patients POC order  Order was place 06/27/13 to evaluate for POC. This was done and patient was advised that they could order everything through her insurance. Pt refused to have everything ordered- tanks, concentrator, gas cyclinders (which can only be supplied through insurance as a package) Per Daneil Dan, a [employer] can be done--nothing will be billed to [employer] and she will pay out of pocket for tanks as needed. The order is good for one year and she will just have to pay OOP whenever she needs a refill of her O2 tanks. For insurance to cover the tanks she has to agree to receive the package, which includes Concentrator and gas cylinders as well.   Per Ace Gins, this is per her insurance, not a [employer] rule. -------- Spoke with patient, she is not interested in using O2 "all the time", just for exertion at times when she feels she needs it. Pt wanting portable oxygen tanks ONLY or a concentrator ONLY, is refusing to let [employer] order her O2 POC through insurance d/t the requirements per her insurance. They are requiring her to okay the tanks, concentrator and gas cylinders.  Pt states that she does not want to pay for something monthly that she is only going to use a few times. Her insurance plan has a very high deductible ($2500.00) and it would cost her $159/mo. for the package until she met deductible and the concentrator alone would only cost her $81/mo to rent.  Pt is wanting some guidance on what would be her best option. Is there somewhere that she can purchase her own concentrator or POC so that she does not have to rent monthly. Pt wants to know if all this is a necessity and if she should bite the bullet and let insurance supply all 3 pieces to her; does she "need" all this?  Please advise Dr Gwenette Greet. Thanks.

## 2013-07-05 NOTE — Telephone Encounter (Signed)
Order to be placed with [employer] for POC Pt aware that we are going to try to get her POC through [employer] since we cannot get coverage through DME for POC alone.  Will send to [facility] as FYI of what is going on.

## 2013-07-05 NOTE — Telephone Encounter (Signed)
Lets see what [employer] can do for her.  All of the above comments make no sense, but there is nothing these days that makes any sense when it comes to dme.

## 2013-07-06 ENCOUNTER — Encounter (HOSPITAL_COMMUNITY)
Admission: RE | Admit: 2013-07-06 | Discharge: 2013-07-06 | Disposition: A | Discharge status: Home / Self Care | Payer: Self-pay | Source: Ambulatory Visit | Attending: Internal Medicine | Admitting: Internal Medicine

## 2013-07-06 NOTE — Telephone Encounter (Signed)
Order & records have already been faxed to [facility] at [facility] this morning.

## 2013-07-09 ENCOUNTER — Encounter (HOSPITAL_COMMUNITY)
Admission: RE | Admit: 2013-07-09 | Discharge: 2013-07-09 | Disposition: A | Discharge status: Home / Self Care | Payer: Self-pay | Source: Ambulatory Visit | Attending: Internal Medicine | Admitting: Internal Medicine

## 2013-07-11 ENCOUNTER — Encounter (HOSPITAL_COMMUNITY): Payer: Self-pay

## 2013-07-13 ENCOUNTER — Encounter (HOSPITAL_COMMUNITY)
Admission: RE | Admit: 2013-07-13 | Discharge: 2013-07-13 | Disposition: A | Discharge status: Home / Self Care | Payer: Self-pay | Source: Ambulatory Visit | Attending: Internal Medicine | Admitting: Internal Medicine

## 2013-07-16 ENCOUNTER — Encounter (HOSPITAL_COMMUNITY): Payer: Self-pay

## 2013-07-18 ENCOUNTER — Encounter (HOSPITAL_COMMUNITY)
Admission: RE | Admit: 2013-07-18 | Discharge: 2013-07-18 | Disposition: A | Discharge status: Home / Self Care | Payer: Self-pay | Source: Ambulatory Visit | Attending: Internal Medicine | Admitting: Internal Medicine

## 2013-07-20 ENCOUNTER — Encounter (HOSPITAL_COMMUNITY)
Admission: RE | Admit: 2013-07-20 | Discharge: 2013-07-20 | Disposition: A | Discharge status: Home / Self Care | Payer: Self-pay | Source: Ambulatory Visit | Attending: Internal Medicine | Admitting: Internal Medicine

## 2013-07-23 ENCOUNTER — Encounter (HOSPITAL_COMMUNITY)
Admission: RE | Admit: 2013-07-23 | Discharge: 2013-07-23 | Disposition: A | Discharge status: Home / Self Care | Payer: Self-pay | Source: Ambulatory Visit | Attending: Internal Medicine | Admitting: Internal Medicine

## 2013-07-23 DIAGNOSIS — D649 Anemia, unspecified: Secondary | ICD-10-CM | POA: Insufficient documentation

## 2013-07-24 ENCOUNTER — Telehealth: Payer: Self-pay | Admitting: *Deleted

## 2013-07-24 ENCOUNTER — Ambulatory Visit (INDEPENDENT_AMBULATORY_CARE_PROVIDER_SITE_OTHER)
Admission: RE | Admit: 2013-07-24 | Discharge: 2013-07-24 | Disposition: A | Discharge status: Home / Self Care | Payer: BC Managed Care – PPO | Source: Ambulatory Visit | Attending: Adult Health | Admitting: Adult Health

## 2013-07-24 ENCOUNTER — Other Ambulatory Visit: Payer: Self-pay | Admitting: Adult Health

## 2013-07-24 ENCOUNTER — Ambulatory Visit (INDEPENDENT_AMBULATORY_CARE_PROVIDER_SITE_OTHER): Payer: BC Managed Care – PPO | Admitting: Adult Health

## 2013-07-24 ENCOUNTER — Telehealth: Payer: Self-pay | Admitting: Adult Health

## 2013-07-24 ENCOUNTER — Encounter: Payer: Self-pay | Admitting: Adult Health

## 2013-07-24 VITALS — BP 142/90 | HR 103 | Temp 97.9°F | Ht 65.0 in | Wt 127.0 lb

## 2013-07-24 DIAGNOSIS — R0989 Other specified symptoms and signs involving the circulatory and respiratory systems: Secondary | ICD-10-CM

## 2013-07-24 DIAGNOSIS — R0609 Other forms of dyspnea: Secondary | ICD-10-CM

## 2013-07-24 DIAGNOSIS — J45909 Unspecified asthma, uncomplicated: Secondary | ICD-10-CM

## 2013-07-24 DIAGNOSIS — R0600 Dyspnea, unspecified: Secondary | ICD-10-CM

## 2013-07-24 MED ORDER — LEVOFLOXACIN 500 MG PO TABS: 500.0000 mg | ORAL_TABLET | Freq: Every day | ORAL | Status: DC

## 2013-07-24 MED ORDER — METHYLPREDNISOLONE ACETATE 80 MG/ML IJ SUSP
120.0000 mg | Freq: Once | INTRAMUSCULAR | Status: AC
  Administered 2013-07-24: 120 mg via INTRAMUSCULAR

## 2013-07-24 MED ORDER — LEVALBUTEROL HCL 0.63 MG/3ML IN NEBU
0.6300 mg | INHALATION_SOLUTION | Freq: Once | RESPIRATORY_TRACT | Status: AC
Start: 2013-07-24 — End: 2013-07-24
  Administered 2013-07-24: 0.63 mg via RESPIRATORY_TRACT

## 2013-07-24 MED ORDER — PREDNISONE 10 MG PO TABS: ORAL_TABLET | ORAL | Status: DC

## 2013-07-24 NOTE — Progress Notes (Signed)
Quick Note:  Called spoke with patient, advised of cxr results / recs as stated by TP. Pt verbalized her understanding and denied any questions. Orders only encounter created for Levaquin rx to [facility] [place] and will keep her 6.16.15 w/ repeat cxr ______

## 2013-07-24 NOTE — Telephone Encounter (Signed)
Pt returning call.Deborah [name] ° °

## 2013-07-24 NOTE — Patient Instructions (Signed)
Begin Oxygen 2l/m at rest and 3l/m with activity. (new O2 start -needs delivery today)  Prednisone taper over next week  Depo  Medrol shot today  Mucinex Twice daily  For congestion  Flutter valve several times daily  Increase Dulera 200 2 puffs Twice daily  For 1 week then resume Dulera 100  follow up Dr. Gwenette Greet in  1 week and As needed   Please contact office for sooner follow up if symptoms do not improve or worsen or seek emergency care

## 2013-07-24 NOTE — Telephone Encounter (Signed)
Called pt home # and LMTCB x1 Called husband cell and received message they are not accepting calls at this time. WCB  I called jason. He reports if they can't get in touch with her then he will send a driver out to her home later and see if they can reach her then. I made TP aware and will sign off message

## 2013-07-24 NOTE — Assessment & Plan Note (Signed)
Exacerbation with underlying bronchiectasis and severe restrictive lung dz  Check cxr today  Pt needs to begin O2 as previously recommended.  [facility] contacted for new start and O2 tank sent home with pt  xopenex neb given in office   Plan  Begin Oxygen 2l/m at rest and 3l/m with activity. (new O2 start -needs delivery today)  Prednisone taper over next week  Depo  Medrol shot today  Mucinex Twice daily  For congestion  Flutter valve several times daily  Increase Dulera 200 2 puffs Twice daily  For 1 week then resume Dulera 100  follow up Dr. Gwenette Greet in  1 week and As needed   Please contact office for sooner follow up if symptoms do not improve or worsen or seek emergency care

## 2013-07-24 NOTE — Telephone Encounter (Signed)
I called pt. She is aware of appt 08/07/13 at 1:45.  Also she has already spoken with [facility]. Nothing further needed

## 2013-07-24 NOTE — Progress Notes (Signed)
   Subjective:    Patient ID: Deborah [name], female    DOB: December 19, 1950, 63 y.o.   MRN: [id number]  HPI 63 yo WF bronchiectasis with superimposed obstructive lung disease.  07/24/2013 Acute OV  Presents for an acute office visit accompanied by husband.  Complains of 2 week with increased dyspnea/DOE . Seems to be getting steadily worse over last few months.  Gets winded with minimal activity.  Seen last 4 weeks ago, started on O2 with activity d/t desaturations with walking. However she has not started this. She has ordered a portable concentrator but has not received it yet.  No fever , increased congestion, n/v/d, edema , chest pain, orthopnea or calf pain.  Does admit not using flutter valve on regular basis.  In office today desats with walking ~80% . , Sats 96% on 2l/m  And required 4l/m with walking to keep sats >90%.      Review of Systems Constitutional:   No  weight loss, night sweats,  Fevers, chills,  +fatigue, or  lassitude.  HEENT:   No headaches,  Difficulty swallowing,  Tooth/dental problems, or  Sore throat,                No sneezing, itching, ear ache,  +nasal congestion, post nasal drip,   CV:  No chest pain,  Orthopnea, PND, swelling in lower extremities, anasarca, dizziness, palpitations, syncope.   GI  No heartburn, indigestion, abdominal pain, nausea, vomiting, diarrhea, change in bowel habits, loss of appetite, bloody stools.   Resp.  No chest wall deformity  Skin: no rash or lesions.  GU: no dysuria, change in color of urine, no urgency or frequency.  No flank pain, no hematuria   MS:  No joint pain or swelling.  No decreased range of motion.  No back pain.  Psych:  No change in mood or affect. No depression or anxiety.  No memory loss.         Objective:   Physical Exam GEN: A/Ox3; pleasant , NAD, frail , elderly   HEENT:  [place]/AT,  EACs-clear, TMs-wnl, NOSE-clear, THROAT-clear, no lesions, no postnasal drip or exudate noted.   NECK:   Supple w/ fair ROM; no JVD; normal carotid impulses w/o bruits; no thyromegaly or nodules palpated; no lymphadenopathy.  RESP  Exp wheezes noted, .no accessory muscle use, no dullness to percussion  CARD:  RRR, no m/r/g  , no peripheral edema, pulses intact, no cyanosis or clubbing.  GI:   Soft & nt; nml bowel sounds; no organomegaly or masses detected.  Musco: Warm bil, no deformities or joint swelling noted.   Neuro: alert, no focal deficits noted.    Skin: Warm, no lesions or rashes         Assessment & Plan:

## 2013-07-24 NOTE — Telephone Encounter (Signed)
Per TP discharge and per [facility] pt needs to follow-up in 1 week. [place] is booked. He advised ok to offer appt on 08-07-13 at 1:45. I have scheduled the pt. LMTCBx1 to advise the pt. [place] Bing, CMA

## 2013-07-24 NOTE — Progress Notes (Signed)
Result Notes    Notes Recorded by Rinaldo Ratel, CMA on 07/24/2013 at 5:35 PM Called spoke with patient, advised of cxr results / recs as stated by TP. Pt verbalized her understanding and denied any questions. Orders only encounter created for Levaquin rx to [facility] [place] and will keep her 6.16.15 w/ repeat cxr ------  Notes Recorded by Melvenia Needles, NP on 07/24/2013 at 5:30 PM ?PNA  Begin Levaquin 573m daily for 7 days . (doubt she could tolerate high dose)  Eat yogurt daily .  Begin Probiotic Align daily to prevent diarrhea.  Will repeat cxr on return

## 2013-07-25 ENCOUNTER — Encounter (HOSPITAL_COMMUNITY): Payer: Self-pay

## 2013-07-25 NOTE — Progress Notes (Signed)
Ov reviewed and pt examined.  Agree with plan as outlined.

## 2013-07-27 ENCOUNTER — Encounter (HOSPITAL_COMMUNITY): Payer: Self-pay

## 2013-07-30 ENCOUNTER — Encounter (HOSPITAL_COMMUNITY): Payer: Self-pay

## 2013-08-01 ENCOUNTER — Encounter (HOSPITAL_COMMUNITY)
Admission: RE | Admit: 2013-08-01 | Discharge: 2013-08-01 | Disposition: A | Discharge status: Home / Self Care | Payer: Self-pay | Source: Ambulatory Visit | Attending: Internal Medicine | Admitting: Internal Medicine

## 2013-08-03 ENCOUNTER — Encounter (HOSPITAL_COMMUNITY): Payer: Self-pay

## 2013-08-06 ENCOUNTER — Encounter (HOSPITAL_COMMUNITY)
Admission: RE | Admit: 2013-08-06 | Discharge: 2013-08-06 | Disposition: A | Discharge status: Home / Self Care | Payer: Self-pay | Source: Ambulatory Visit | Attending: Internal Medicine | Admitting: Internal Medicine

## 2013-08-07 ENCOUNTER — Ambulatory Visit (INDEPENDENT_AMBULATORY_CARE_PROVIDER_SITE_OTHER): Payer: BC Managed Care – PPO | Admitting: Pulmonary Disease

## 2013-08-07 ENCOUNTER — Encounter: Payer: Self-pay | Admitting: Pulmonary Disease

## 2013-08-07 VITALS — BP 134/76 | HR 103 | Temp 98.0°F | Ht 65.0 in | Wt 124.4 lb

## 2013-08-07 DIAGNOSIS — J9610 Chronic respiratory failure, unspecified whether with hypoxia or hypercapnia: Secondary | ICD-10-CM

## 2013-08-07 DIAGNOSIS — J479 Bronchiectasis, uncomplicated: Secondary | ICD-10-CM

## 2013-08-07 DIAGNOSIS — J45909 Unspecified asthma, uncomplicated: Secondary | ICD-10-CM

## 2013-08-07 DIAGNOSIS — J984 Other disorders of lung: Secondary | ICD-10-CM

## 2013-08-07 MED ORDER — MOMETASONE FURO-FORMOTEROL FUM 100-5 MCG/ACT IN AERO: 2.0000 | INHALATION_SPRAY | Freq: Two times a day (BID) | RESPIRATORY_TRACT | Status: AC

## 2013-08-07 NOTE — Progress Notes (Signed)
   Subjective:    Patient ID: Deborah [name], female    DOB: 22-Jan-1951, 63 y.o.   MRN: [id number]  HPI Patient comes in today for followup of her known chronic respiratory failure secondary to restrictive lung disease, bronchiectasis, and underlying airflow obstruction. At her last visit she was having oxygen desaturations, felt poorly, and a chest x-ray showed a questionable increased density in the right base. She was treated for an acute flare and as well as antibiotics for possible pneumonia. She comes in today where she is much improved since that time and feels that she is returning to baseline. She denies any significant cough or purulent mucus. She now has her portable concentrator and is wearing compliantly.   Review of Systems  Constitutional: Negative for fever and unexpected weight change.  HENT: Negative for congestion, dental problem, ear pain, nosebleeds, postnasal drip, rhinorrhea, sinus pressure, sneezing, sore throat and trouble swallowing.   Eyes: Negative for redness and itching.  Respiratory: Positive for shortness of breath. Negative for cough, chest tightness and wheezing.   Cardiovascular: Negative for palpitations and leg swelling.  Gastrointestinal: Negative for nausea and vomiting.  Genitourinary: Negative for dysuria.  Musculoskeletal: Negative for joint swelling.  Skin: Negative for rash.  Neurological: Negative for headaches.  Hematological: Does not bruise/bleed easily.  Psychiatric/Behavioral: Negative for dysphoric mood. The patient is not nervous/anxious.        Objective:   Physical Exam Thin female in no acute distress Nose without purulence or discharge noted Neck without lymphadenopathy or thyromegaly Chest with decreased breath sounds left base and a few crackles, otherwise clear Cardiac exam with regular rate and rhythm, 2/6 systolic murmur Lower extremities without edema, no cyanosis Alert and oriented, moves all 4 extremities.         Assessment & Plan:

## 2013-08-07 NOTE — Patient Instructions (Signed)
No change in current meds except to stay on dulera 100 for now.  Will send in prescription for this. Wear oxygen with sleep, and anytime you leave the house or do exertional things around the house.  Do not need at rest if sats are staying greater than 88%. Stay in pulmonary rehab. followup with me at previously scheduled time.

## 2013-08-07 NOTE — Assessment & Plan Note (Signed)
The patient feels that she has returned to her normal baseline since her episode of possible pneumonia. She is wearing her oxygen with sleep and exertion compliantly. She needs to stay on her dulera, but I would prefer the 100 strength. I am willing to increase this if she sees a definite drop in her pulmonary symptoms.

## 2013-08-08 ENCOUNTER — Encounter (HOSPITAL_COMMUNITY)
Admission: RE | Admit: 2013-08-08 | Discharge: 2013-08-08 | Disposition: A | Discharge status: Home / Self Care | Payer: Self-pay | Source: Ambulatory Visit | Attending: Internal Medicine | Admitting: Internal Medicine

## 2013-08-10 ENCOUNTER — Encounter (HOSPITAL_COMMUNITY)
Admission: RE | Admit: 2013-08-10 | Discharge: 2013-08-10 | Disposition: A | Discharge status: Home / Self Care | Payer: Self-pay | Source: Ambulatory Visit | Attending: Internal Medicine | Admitting: Internal Medicine

## 2013-08-13 ENCOUNTER — Encounter (HOSPITAL_COMMUNITY)
Admission: RE | Admit: 2013-08-13 | Discharge: 2013-08-13 | Disposition: A | Discharge status: Home / Self Care | Payer: Self-pay | Source: Ambulatory Visit | Attending: Internal Medicine | Admitting: Internal Medicine

## 2013-08-15 ENCOUNTER — Encounter (HOSPITAL_COMMUNITY)
Admission: RE | Admit: 2013-08-15 | Discharge: 2013-08-15 | Disposition: A | Discharge status: Home / Self Care | Payer: Self-pay | Source: Ambulatory Visit | Attending: Internal Medicine | Admitting: Internal Medicine

## 2013-08-17 ENCOUNTER — Encounter (HOSPITAL_COMMUNITY)
Admission: RE | Admit: 2013-08-17 | Discharge: 2013-08-17 | Disposition: A | Discharge status: Home / Self Care | Payer: Self-pay | Source: Ambulatory Visit | Attending: Internal Medicine | Admitting: Internal Medicine

## 2013-08-20 ENCOUNTER — Encounter (HOSPITAL_COMMUNITY)
Admission: RE | Admit: 2013-08-20 | Discharge: 2013-08-20 | Disposition: A | Discharge status: Home / Self Care | Payer: Self-pay | Source: Ambulatory Visit | Attending: Internal Medicine | Admitting: Internal Medicine

## 2013-08-21 ENCOUNTER — Telehealth: Payer: Self-pay | Admitting: Pulmonary Disease

## 2013-08-21 DIAGNOSIS — J984 Other disorders of lung: Secondary | ICD-10-CM

## 2013-08-21 NOTE — Telephone Encounter (Signed)
ATC PT at home #. Line rang several times. No answer and no VM wcb

## 2013-08-22 ENCOUNTER — Encounter (HOSPITAL_COMMUNITY)
Admission: RE | Admit: 2013-08-22 | Discharge: 2013-08-22 | Disposition: A | Discharge status: Home / Self Care | Payer: Self-pay | Source: Ambulatory Visit | Attending: Internal Medicine | Admitting: Internal Medicine

## 2013-08-22 DIAGNOSIS — D649 Anemia, unspecified: Secondary | ICD-10-CM | POA: Insufficient documentation

## 2013-08-23 NOTE — Telephone Encounter (Signed)
LMTCBx1. [place] Bing, CMA

## 2013-08-23 NOTE — Telephone Encounter (Signed)
Pt returning call.Deborah [name] ° °

## 2013-08-23 NOTE — Telephone Encounter (Signed)
Order placed to D/C O2 Pt aware.  Nothing further needed.

## 2013-08-23 NOTE — Telephone Encounter (Signed)
Pt states that she now has her own oxygen through [employer]. Would like to have O2 supplied through [facility] to be picked up  Please advise Dr Gwenette Greet on order to D/C and pick up O2. Thanks!

## 2013-08-23 NOTE — Telephone Encounter (Signed)
Ok to send d/c order

## 2013-08-27 ENCOUNTER — Encounter (HOSPITAL_COMMUNITY)
Admission: RE | Admit: 2013-08-27 | Discharge: 2013-08-27 | Disposition: A | Discharge status: Home / Self Care | Payer: Self-pay | Source: Ambulatory Visit | Attending: Pulmonary Disease | Admitting: Pulmonary Disease

## 2013-08-29 ENCOUNTER — Encounter (HOSPITAL_COMMUNITY)
Admission: RE | Admit: 2013-08-29 | Discharge: 2013-08-29 | Disposition: A | Discharge status: Home / Self Care | Payer: Self-pay | Source: Ambulatory Visit | Attending: Pulmonary Disease | Admitting: Pulmonary Disease

## 2013-08-31 ENCOUNTER — Encounter (HOSPITAL_COMMUNITY)
Admission: RE | Admit: 2013-08-31 | Discharge: 2013-08-31 | Disposition: A | Discharge status: Home / Self Care | Payer: Self-pay | Source: Ambulatory Visit | Attending: Pulmonary Disease | Admitting: Pulmonary Disease

## 2013-09-03 ENCOUNTER — Encounter (HOSPITAL_COMMUNITY)
Admission: RE | Admit: 2013-09-03 | Discharge: 2013-09-03 | Disposition: A | Discharge status: Home / Self Care | Payer: Self-pay | Source: Ambulatory Visit | Attending: Pulmonary Disease | Admitting: Pulmonary Disease

## 2013-09-05 ENCOUNTER — Encounter (HOSPITAL_COMMUNITY)
Admission: RE | Admit: 2013-09-05 | Discharge: 2013-09-05 | Disposition: A | Discharge status: Home / Self Care | Payer: Self-pay | Source: Ambulatory Visit | Attending: Pulmonary Disease | Admitting: Pulmonary Disease

## 2013-09-07 ENCOUNTER — Encounter (HOSPITAL_COMMUNITY)
Admission: RE | Admit: 2013-09-07 | Discharge: 2013-09-07 | Disposition: A | Discharge status: Home / Self Care | Payer: Self-pay | Source: Ambulatory Visit | Attending: Pulmonary Disease | Admitting: Pulmonary Disease

## 2013-09-10 ENCOUNTER — Encounter (HOSPITAL_COMMUNITY)
Admission: RE | Admit: 2013-09-10 | Discharge: 2013-09-10 | Disposition: A | Discharge status: Home / Self Care | Payer: Self-pay | Source: Ambulatory Visit | Attending: Pulmonary Disease | Admitting: Pulmonary Disease

## 2013-09-12 ENCOUNTER — Encounter (HOSPITAL_COMMUNITY)
Admission: RE | Admit: 2013-09-12 | Discharge: 2013-09-12 | Disposition: A | Discharge status: Home / Self Care | Payer: Self-pay | Source: Ambulatory Visit | Attending: Pulmonary Disease | Admitting: Pulmonary Disease

## 2013-09-14 ENCOUNTER — Encounter (HOSPITAL_COMMUNITY)
Admission: RE | Admit: 2013-09-14 | Discharge: 2013-09-14 | Disposition: A | Discharge status: Home / Self Care | Payer: Self-pay | Source: Ambulatory Visit | Attending: Pulmonary Disease | Admitting: Pulmonary Disease

## 2013-09-17 ENCOUNTER — Encounter (HOSPITAL_COMMUNITY)
Admission: RE | Admit: 2013-09-17 | Discharge: 2013-09-17 | Disposition: A | Discharge status: Home / Self Care | Payer: Self-pay | Source: Ambulatory Visit | Attending: Pulmonary Disease | Admitting: Pulmonary Disease

## 2013-09-18 ENCOUNTER — Ambulatory Visit (INDEPENDENT_AMBULATORY_CARE_PROVIDER_SITE_OTHER)
Admission: RE | Admit: 2013-09-18 | Discharge: 2013-09-18 | Disposition: A | Discharge status: Home / Self Care | Payer: BC Managed Care – PPO | Source: Ambulatory Visit | Attending: Adult Health | Admitting: Adult Health

## 2013-09-18 ENCOUNTER — Ambulatory Visit (INDEPENDENT_AMBULATORY_CARE_PROVIDER_SITE_OTHER): Payer: BC Managed Care – PPO | Admitting: Adult Health

## 2013-09-18 ENCOUNTER — Telehealth: Payer: Self-pay | Admitting: Pulmonary Disease

## 2013-09-18 ENCOUNTER — Encounter: Payer: Self-pay | Admitting: Adult Health

## 2013-09-18 VITALS — BP 120/88 | HR 106 | Temp 97.9°F | Ht 68.0 in | Wt 124.2 lb

## 2013-09-18 DIAGNOSIS — J479 Bronchiectasis, uncomplicated: Secondary | ICD-10-CM

## 2013-09-18 DIAGNOSIS — R0902 Hypoxemia: Secondary | ICD-10-CM

## 2013-09-18 DIAGNOSIS — J9610 Chronic respiratory failure, unspecified whether with hypoxia or hypercapnia: Secondary | ICD-10-CM

## 2013-09-18 DIAGNOSIS — J9611 Chronic respiratory failure with hypoxia: Secondary | ICD-10-CM

## 2013-09-18 NOTE — Patient Instructions (Signed)
Continue with  Oxygen 2l/m at rest and 4l/m with activity.  Continue with Flutter valve several times daily  Increase Dulera 200 2 puffs Twice daily   Chest xray today .  Follow up Dr. Gwenette Greet in  6 weeks and As needed   Please contact office for sooner follow up if symptoms do not improve or worsen or seek emergency care

## 2013-09-18 NOTE — Progress Notes (Signed)
Subjective:    Patient ID: Deborah [name], female    DOB: 02/16/1951, 63 y.o.   MRN: [id number]  HPI  63 yo WF bronchiectasis with superimposed obstructive lung disease.  09/18/2013 Acute OV  Presents for an acute office visit.  Has Innogen concentrator does well at rest, sats >90% but with walking sats drop ~86-88%. Says she is a lot better than she was few weeks ago feels better with oxgyen .  Gets winded with walking at times has to rest. Some dry cough and wheezing , on /off.  No fever or increased discolored mucus.   Review of Systems  Constitutional:   No  weight loss, night sweats,  Fevers, chills,  +fatigue, or  lassitude.  HEENT:   No headaches,  Difficulty swallowing,  Tooth/dental problems, or  Sore throat,                No sneezing, itching, ear ache,  +nasal congestion, post nasal drip,   CV:  No chest pain,  Orthopnea, PND, swelling in lower extremities, anasarca, dizziness, palpitations, syncope.   GI  No heartburn, indigestion, abdominal pain, nausea, vomiting, diarrhea, change in bowel habits, loss of appetite, bloody stools.   Resp.  No chest wall deformity  Skin: no rash or lesions.  GU: no dysuria, change in color of urine, no urgency or frequency.  No flank pain, no hematuria   MS:  No joint pain or swelling.  No decreased range of motion.  No back pain.  Psych:  No change in mood or affect. No depression or anxiety.  No memory loss.         Objective:   Physical Exam  GEN: A/Ox3; pleasant , NAD, frail , elderly   HEENT:  [place]/AT,  EACs-clear, TMs-wnl, NOSE-clear, THROAT-clear, no lesions, no postnasal drip or exudate noted.   NECK:  Supple w/ fair ROM; no JVD; normal carotid impulses w/o bruits; no thyromegaly or nodules palpated; no lymphadenopathy.  RESP  Exp wheezes noted, .no accessory muscle use, no dullness to percussion  CARD:  RRR, no m/r/g  , no peripheral edema, pulses intact, no cyanosis or clubbing.  GI:   Soft & nt; nml  bowel sounds; no organomegaly or masses detected.  Musco: Warm bil, no deformities or joint swelling noted.   Neuro: alert, no focal deficits noted.    Skin: Warm, no lesions or rashes         Assessment & Plan:

## 2013-09-18 NOTE — Telephone Encounter (Signed)
Called spoke with pt. She reports she has been having slight increase SOB. ? Change her inhalers. Since [facility] is off this week she is scheduled to come in and see TP this afternoon. Nothing further needed

## 2013-09-19 ENCOUNTER — Encounter (HOSPITAL_COMMUNITY)
Admission: RE | Admit: 2013-09-19 | Discharge: 2013-09-19 | Disposition: A | Discharge status: Home / Self Care | Payer: Self-pay | Source: Ambulatory Visit | Attending: Pulmonary Disease | Admitting: Pulmonary Disease

## 2013-09-20 NOTE — Progress Notes (Signed)
Quick Note:  Called spoke with patient, advised of cxr results / recs as stated by TP. Pt verbalized her understanding and denied any questions. ______

## 2013-09-21 ENCOUNTER — Encounter (HOSPITAL_COMMUNITY)
Admission: RE | Admit: 2013-09-21 | Discharge: 2013-09-21 | Disposition: A | Discharge status: Home / Self Care | Payer: Self-pay | Source: Ambulatory Visit | Attending: Pulmonary Disease | Admitting: Pulmonary Disease

## 2013-09-21 NOTE — Assessment & Plan Note (Signed)
Recent flare now resolving w/ chronic symtpoms  Check cxr to make sure acute process not occuring  Discussed oxygen demands and usage.  For now can use pulse demand device , needs continuous flow ideally   Plan  Continue with  Oxygen 2l/m at rest and 4l/m with activity.  Continue with Flutter valve several times daily  Increase Dulera 200 2 puffs Twice daily   Chest xray today .  Follow up Dr. Gwenette Greet in  6 weeks and As needed   Please contact office for sooner follow up if symptoms do not improve or worsen or seek emergency care

## 2013-09-24 ENCOUNTER — Encounter (HOSPITAL_COMMUNITY)
Admission: RE | Admit: 2013-09-24 | Discharge: 2013-09-24 | Disposition: A | Discharge status: Home / Self Care | Payer: Self-pay | Source: Ambulatory Visit | Attending: Internal Medicine | Admitting: Internal Medicine

## 2013-09-24 DIAGNOSIS — D649 Anemia, unspecified: Secondary | ICD-10-CM | POA: Insufficient documentation

## 2013-09-26 ENCOUNTER — Encounter (HOSPITAL_COMMUNITY)
Admission: RE | Admit: 2013-09-26 | Discharge: 2013-09-26 | Disposition: A | Discharge status: Home / Self Care | Payer: Self-pay | Source: Ambulatory Visit | Attending: Pulmonary Disease | Admitting: Pulmonary Disease

## 2013-09-26 NOTE — Progress Notes (Signed)
Patient exercises in the Pulmonary Rehab Maintenance program at [facility]. Patient's blood pressure has been elevated at rest the last several sessions at exercise. Today patient's BP was: 1st station: Lt arm 152/80 Rt arm 180/82, 2nd station Lt arm 162/76 Rt arm 182/76. Will fax exercise flow sheet to Dr. Joylene Draft, patient's PCP for review.

## 2013-09-28 ENCOUNTER — Encounter (HOSPITAL_COMMUNITY)
Admission: RE | Admit: 2013-09-28 | Discharge: 2013-09-28 | Disposition: A | Discharge status: Home / Self Care | Payer: Self-pay | Source: Ambulatory Visit | Attending: Pulmonary Disease | Admitting: Pulmonary Disease

## 2013-10-01 ENCOUNTER — Encounter (HOSPITAL_COMMUNITY)
Admission: RE | Admit: 2013-10-01 | Discharge: 2013-10-01 | Disposition: A | Discharge status: Home / Self Care | Payer: Self-pay | Source: Ambulatory Visit | Attending: Pulmonary Disease | Admitting: Pulmonary Disease

## 2013-10-03 ENCOUNTER — Encounter (HOSPITAL_COMMUNITY)
Admission: RE | Admit: 2013-10-03 | Discharge: 2013-10-03 | Disposition: A | Discharge status: Home / Self Care | Payer: Self-pay | Source: Ambulatory Visit | Attending: Pulmonary Disease | Admitting: Pulmonary Disease

## 2013-10-05 ENCOUNTER — Encounter (HOSPITAL_COMMUNITY)
Admission: RE | Admit: 2013-10-05 | Discharge: 2013-10-05 | Disposition: A | Discharge status: Home / Self Care | Payer: Self-pay | Source: Ambulatory Visit | Attending: Pulmonary Disease | Admitting: Pulmonary Disease

## 2013-10-08 ENCOUNTER — Encounter (HOSPITAL_COMMUNITY)
Admission: RE | Admit: 2013-10-08 | Discharge: 2013-10-08 | Disposition: A | Discharge status: Home / Self Care | Payer: Self-pay | Source: Ambulatory Visit | Attending: Pulmonary Disease | Admitting: Pulmonary Disease

## 2013-10-10 ENCOUNTER — Encounter (HOSPITAL_COMMUNITY)
Admission: RE | Admit: 2013-10-10 | Discharge: 2013-10-10 | Disposition: A | Discharge status: Home / Self Care | Payer: Self-pay | Source: Ambulatory Visit | Attending: Pulmonary Disease | Admitting: Pulmonary Disease

## 2013-10-12 ENCOUNTER — Encounter (HOSPITAL_COMMUNITY)
Admission: RE | Admit: 2013-10-12 | Discharge: 2013-10-12 | Disposition: A | Discharge status: Home / Self Care | Payer: Self-pay | Source: Ambulatory Visit | Attending: Pulmonary Disease | Admitting: Pulmonary Disease

## 2013-10-15 ENCOUNTER — Encounter (HOSPITAL_COMMUNITY)
Admission: RE | Admit: 2013-10-15 | Discharge: 2013-10-15 | Disposition: A | Discharge status: Home / Self Care | Payer: Self-pay | Source: Ambulatory Visit | Attending: Pulmonary Disease | Admitting: Pulmonary Disease

## 2013-10-17 ENCOUNTER — Encounter (HOSPITAL_COMMUNITY)
Admission: RE | Admit: 2013-10-17 | Discharge: 2013-10-17 | Disposition: A | Discharge status: Home / Self Care | Payer: Self-pay | Source: Ambulatory Visit | Attending: Pulmonary Disease | Admitting: Pulmonary Disease

## 2013-10-19 ENCOUNTER — Encounter (HOSPITAL_COMMUNITY)
Admission: RE | Admit: 2013-10-19 | Discharge: 2013-10-19 | Disposition: A | Discharge status: Home / Self Care | Payer: Self-pay | Source: Ambulatory Visit | Attending: Pulmonary Disease | Admitting: Pulmonary Disease

## 2013-10-22 ENCOUNTER — Encounter (HOSPITAL_COMMUNITY)
Admission: RE | Admit: 2013-10-22 | Discharge: 2013-10-22 | Disposition: A | Discharge status: Home / Self Care | Payer: Self-pay | Source: Ambulatory Visit | Attending: Pulmonary Disease | Admitting: Pulmonary Disease

## 2013-10-24 ENCOUNTER — Encounter (HOSPITAL_COMMUNITY)
Admission: RE | Admit: 2013-10-24 | Discharge: 2013-10-24 | Disposition: A | Discharge status: Home / Self Care | Payer: Self-pay | Source: Ambulatory Visit | Attending: Internal Medicine | Admitting: Internal Medicine

## 2013-10-24 DIAGNOSIS — D649 Anemia, unspecified: Secondary | ICD-10-CM | POA: Insufficient documentation

## 2013-10-26 ENCOUNTER — Telehealth: Payer: Self-pay | Admitting: Pulmonary Disease

## 2013-10-26 ENCOUNTER — Encounter (HOSPITAL_COMMUNITY)
Admission: RE | Admit: 2013-10-26 | Discharge: 2013-10-26 | Disposition: A | Discharge status: Home / Self Care | Payer: Self-pay | Source: Ambulatory Visit | Attending: Pulmonary Disease | Admitting: Pulmonary Disease

## 2013-10-26 MED ORDER — ALBUTEROL SULFATE HFA 108 (90 BASE) MCG/ACT IN AERS: 2.0000 | INHALATION_SPRAY | Freq: Four times a day (QID) | RESPIRATORY_TRACT | Status: AC | PRN

## 2013-10-26 NOTE — Telephone Encounter (Signed)
Called pt. Aware RX for albuterol has been sent in. Nothing further needed

## 2013-10-29 ENCOUNTER — Encounter (HOSPITAL_COMMUNITY): Payer: Self-pay

## 2013-10-30 ENCOUNTER — Encounter: Payer: Self-pay | Admitting: Pulmonary Disease

## 2013-10-30 ENCOUNTER — Ambulatory Visit (INDEPENDENT_AMBULATORY_CARE_PROVIDER_SITE_OTHER): Payer: BC Managed Care – PPO | Admitting: Pulmonary Disease

## 2013-10-30 ENCOUNTER — Telehealth: Payer: Self-pay | Admitting: Internal Medicine

## 2013-10-30 VITALS — BP 130/70 | HR 108 | Temp 98.0°F | Ht 65.0 in | Wt 124.0 lb

## 2013-10-30 DIAGNOSIS — J984 Other disorders of lung: Secondary | ICD-10-CM

## 2013-10-30 DIAGNOSIS — J9611 Chronic respiratory failure with hypoxia: Secondary | ICD-10-CM

## 2013-10-30 DIAGNOSIS — J479 Bronchiectasis, uncomplicated: Secondary | ICD-10-CM

## 2013-10-30 DIAGNOSIS — Z23 Encounter for immunization: Secondary | ICD-10-CM

## 2013-10-30 DIAGNOSIS — J9610 Chronic respiratory failure, unspecified whether with hypoxia or hypercapnia: Secondary | ICD-10-CM

## 2013-10-30 DIAGNOSIS — R0902 Hypoxemia: Secondary | ICD-10-CM

## 2013-10-30 NOTE — Patient Instructions (Signed)
Continue your breathing medications, and use your pulsed oxygen when around the house and on short trips. Will order continuous oxygen for when you are doing more strenuous activities, or will be active for a long period of time.  Would use 3 liters continuous and see how you do. Continue in pulmonary rehab Will give you the flu shot today followup with me again in 71mo.

## 2013-10-30 NOTE — Assessment & Plan Note (Signed)
The patient has not had an acute exacerbation of her bronchiectasis since last visit. She is not overly congested, nor is she producing purulent mucus currently.

## 2013-10-30 NOTE — Telephone Encounter (Signed)
Error

## 2013-10-30 NOTE — Progress Notes (Signed)
   Subjective:    Patient ID: Deborah [name], female    DOB: 1951/02/06, 63 y.o.   MRN: [id number]  HPI Patient comes in today for followup of her known chronic respiratory failure secondary to extensive bronchiectasis and restrictive chest wall disorder involving the left hemithorax, as well as some changes in the right lung as well. She has been doing fairly well overall, with no change in her dyspnea on exertion. She has not had a recent chest infection. She did have significant desaturation today walking from the parking lot, but it turns out that she had a leak in her oxygen tubing. However, she tells me that she has been desaturating with heavy her exertional activity on the pulsed oxygen for quite some time. She is interested in getting continuous oxygen for her heavy her exertional activities. She is also had a thoracic surgery followup at [facility], and a recent CT there by report showed a new 9 mm nodule in the right lung, as well as some groundglass changes in the right lower lobe. Her left lung was unchanged.   Review of Systems  Constitutional: Negative for fever and unexpected weight change.  HENT: Negative for congestion, dental problem, ear pain, nosebleeds, postnasal drip, rhinorrhea, sinus pressure, sneezing, sore throat and trouble swallowing.   Eyes: Negative for redness and itching.  Respiratory: Positive for shortness of breath. Negative for cough, chest tightness and wheezing.   Cardiovascular: Negative for palpitations and leg swelling.  Gastrointestinal: Negative for nausea and vomiting.  Genitourinary: Negative for dysuria.  Musculoskeletal: Negative for joint swelling.  Skin: Negative for rash.  Neurological: Negative for headaches.  Hematological: Does not bruise/bleed easily.  Psychiatric/Behavioral: Negative for dysphoric mood. The patient is not nervous/anxious.        Objective:   Physical Exam Frail-appearing female in no acute distress Nose without purulence  or discharge noted Neck without lymphadenopathy or thyromegaly Chest with very poor air flow on the left, with right basilar crackles. No wheezing noted Cardiac exam with regular rate and rhythm Lower extremities without edema, no cyanosis Alert and oriented, moves all 4 extremities.       Assessment & Plan:

## 2013-10-30 NOTE — Assessment & Plan Note (Signed)
The patient has a leak in her oxygen tubing today which is contributing to her desaturation, but she also tells me that she really has not maintained sats on pulsed oxygen with heavy her exertional activities. We'll go ahead and get her a continuous oxygen source to use with these types of activities.

## 2013-10-31 ENCOUNTER — Encounter (HOSPITAL_COMMUNITY): Payer: Self-pay

## 2013-11-02 ENCOUNTER — Telehealth: Payer: Self-pay | Admitting: Pulmonary Disease

## 2013-11-02 ENCOUNTER — Encounter (HOSPITAL_COMMUNITY): Payer: Self-pay

## 2013-11-02 NOTE — Telephone Encounter (Signed)
Spoke with Deborah [name] at [place].  Qualifying sats were updated in EMR from ov on 10/30/13.  Results faxed to [facility].  She is also working on getting the continuous oxygen approved through [employer].

## 2013-11-05 ENCOUNTER — Encounter (HOSPITAL_COMMUNITY): Payer: Self-pay

## 2013-11-05 ENCOUNTER — Telehealth: Payer: Self-pay | Admitting: Pulmonary Disease

## 2013-11-06 NOTE — Telephone Encounter (Signed)
LMOM TCB x1 for Mandy w/ [employer]

## 2013-11-07 ENCOUNTER — Encounter (HOSPITAL_COMMUNITY): Payer: Self-pay

## 2013-11-07 NOTE — Telephone Encounter (Signed)
Spoke with Leafy Ro  She wanted [place] to be aware that the pt may be interested in liquid POC  They are going to show her liquid o2 and if she likes this they will need order  Will forward to [facility] as FYI

## 2013-11-09 ENCOUNTER — Encounter (HOSPITAL_COMMUNITY): Payer: Self-pay

## 2013-11-12 ENCOUNTER — Encounter (HOSPITAL_COMMUNITY): Payer: Self-pay

## 2013-11-14 ENCOUNTER — Encounter (HOSPITAL_COMMUNITY): Payer: Self-pay

## 2013-11-16 ENCOUNTER — Encounter (HOSPITAL_COMMUNITY)
Admission: RE | Admit: 2013-11-16 | Discharge: 2013-11-16 | Disposition: A | Discharge status: Home / Self Care | Payer: Self-pay | Source: Ambulatory Visit | Attending: Pulmonary Disease | Admitting: Pulmonary Disease

## 2013-11-19 ENCOUNTER — Encounter (HOSPITAL_COMMUNITY): Payer: Self-pay

## 2013-11-19 ENCOUNTER — Telehealth: Payer: Self-pay | Admitting: Pulmonary Disease

## 2013-11-19 DIAGNOSIS — J9610 Chronic respiratory failure, unspecified whether with hypoxia or hypercapnia: Secondary | ICD-10-CM

## 2013-11-19 NOTE — Telephone Encounter (Signed)
Mandy w/ [employer] returned triage's call & can be reached at [phone].  Satira Anis

## 2013-11-19 NOTE — Telephone Encounter (Signed)
lmomtcb x1 for [employer]

## 2013-11-19 NOTE — Telephone Encounter (Signed)
Spoke with Ron Agee States that she needs an order for Liquid portable oxygen @ 2L/min Needing order for 24hr O2 liquid oxygen, stationary and helios  Please advise Dr Gwenette Greet. Thanks

## 2013-11-20 NOTE — Telephone Encounter (Signed)
Please call pt first and find out if this is really what she wants.  If she says yes, ok to send order for stationary liquid oxygen and a portable liquid oxygen system.

## 2013-11-21 ENCOUNTER — Encounter (HOSPITAL_COMMUNITY): Payer: Self-pay

## 2013-11-21 NOTE — Telephone Encounter (Signed)
Ok to send the order, and would do it for 3 lpm for now.  The pt has oximeter at home, and can then let us know if she is consistently falling to less than 88%

## 2013-11-21 NOTE — Telephone Encounter (Signed)
Spoke with patient-- patient would like to have an order placed for Stationary Liquid Oxygen and a Portable Liquid Oxygen System through [employer].  Leafy Ro is aware that we will send this order. Need to know from [facility] what range he wants her set at. Per our notes, we have her 87% at 3Lmin during recent amb ox; per walk patient needs 4L/min with exertion to maintain O2 above 90%. OV notes from 10/30/13 states Dr Gwenette Greet recommends patient use 3L/min. Patient states that this is not correct and she is supposed to be on 4L/min.  Patient Saturations on RA at rest = 79%  Tested on 10/30/13 by Arena Bing, CMA SATURATION QUALIFICATIONS:  Patient Saturations on 2 liters of oxygen while ambulating = 88% Patient Saturations on 3 liters of oxygen while ambulating = 87% Patient Saturations on 4 Liters of oxygen while Ambulating = 91% [name] CMA 07/24/13  Please advise Dr Gwenette Greet. Thanks

## 2013-11-22 NOTE — Telephone Encounter (Signed)
Pt returned call [phone]

## 2013-11-22 NOTE — Telephone Encounter (Signed)
Spoke with pt and advised of Dr Janifer Adie recommendations to keep oxygen at 3L and will increase of sats stay consistently below 88%

## 2013-11-22 NOTE — Telephone Encounter (Signed)
Called and spoke to Garland with [employer]. Informed Mandy of the O2 update of 3lpm 24/7. Leafy Ro stated the update will be added to the previous order and nothing further is needed.  LMTCB for pt to inform her of the recs per [facility].

## 2013-11-23 ENCOUNTER — Encounter (HOSPITAL_COMMUNITY): Payer: BC Managed Care – PPO | Attending: Internal Medicine

## 2013-11-23 DIAGNOSIS — J9610 Chronic respiratory failure, unspecified whether with hypoxia or hypercapnia: Secondary | ICD-10-CM | POA: Diagnosis not present

## 2013-11-23 DIAGNOSIS — Z5189 Encounter for other specified aftercare: Secondary | ICD-10-CM | POA: Insufficient documentation

## 2013-11-23 DIAGNOSIS — J479 Bronchiectasis, uncomplicated: Secondary | ICD-10-CM | POA: Insufficient documentation

## 2013-11-26 ENCOUNTER — Encounter (HOSPITAL_COMMUNITY): Payer: BC Managed Care – PPO

## 2013-11-28 ENCOUNTER — Encounter (HOSPITAL_COMMUNITY): Payer: BC Managed Care – PPO

## 2013-11-30 ENCOUNTER — Encounter (HOSPITAL_COMMUNITY): Payer: BC Managed Care – PPO

## 2013-12-03 ENCOUNTER — Encounter (HOSPITAL_COMMUNITY): Payer: BC Managed Care – PPO

## 2013-12-05 ENCOUNTER — Encounter (HOSPITAL_COMMUNITY): Payer: BC Managed Care – PPO

## 2013-12-07 ENCOUNTER — Encounter (HOSPITAL_COMMUNITY): Payer: BC Managed Care – PPO

## 2013-12-10 ENCOUNTER — Encounter (HOSPITAL_COMMUNITY): Payer: BC Managed Care – PPO

## 2013-12-12 ENCOUNTER — Encounter (HOSPITAL_COMMUNITY): Payer: BC Managed Care – PPO

## 2013-12-14 ENCOUNTER — Encounter (HOSPITAL_COMMUNITY): Payer: BC Managed Care – PPO

## 2013-12-17 ENCOUNTER — Telehealth (HOSPITAL_COMMUNITY): Payer: Self-pay

## 2013-12-17 ENCOUNTER — Encounter (HOSPITAL_COMMUNITY): Payer: BC Managed Care – PPO

## 2013-12-17 NOTE — Telephone Encounter (Signed)
Called and spoke with patient regarding her health status and to see when she was planning on returning to exercise here at cardiac rehab. Patient stated she was originally planning on returning on 11/1 as per doctors order. She has since been readmitted to [facility] for a 10 day admission for pulmonary hypertension. She stated she had a follow up appointment with her pulmonologist at [facility] on 11/16 and would inquire about her ability to return to exercise at that time. It was decided with the patient that we would discharge her from the maintenance exercise program at this time and readmit when appropriate. Patient instructed to call when she was ready to return.

## 2013-12-19 ENCOUNTER — Encounter (HOSPITAL_COMMUNITY): Payer: BC Managed Care – PPO

## 2014-01-07 ENCOUNTER — Telehealth (HOSPITAL_COMMUNITY): Payer: Self-pay

## 2014-01-07 NOTE — Telephone Encounter (Signed)
Called Mrs. Odem to discuss her options for returning to pulmonary rehab. Informed her that she now qualified for the undergraduate program given her new diagnosis of pulmonary hypertension. She is going to call her insurance to discuss coverage. Codes given. Will call back to confirm her choice for returning to undergrade or continue with the maintenance program and at that time appropriate order will be sent to her MD for completion.

## 2014-01-21 ENCOUNTER — Encounter (HOSPITAL_COMMUNITY): Payer: Self-pay

## 2014-01-21 ENCOUNTER — Encounter (HOSPITAL_COMMUNITY)
Admission: RE | Admit: 2014-01-21 | Discharge: 2014-01-21 | Disposition: A | Discharge status: Home / Self Care | Payer: BC Managed Care – PPO | Source: Ambulatory Visit | Attending: Internal Medicine | Admitting: Internal Medicine

## 2014-01-21 VITALS — BP 134/66 | HR 119 | Resp 20 | Ht 65.0 in | Wt 119.9 lb

## 2014-01-21 DIAGNOSIS — J9610 Chronic respiratory failure, unspecified whether with hypoxia or hypercapnia: Secondary | ICD-10-CM | POA: Insufficient documentation

## 2014-01-21 DIAGNOSIS — Z5189 Encounter for other specified aftercare: Secondary | ICD-10-CM | POA: Insufficient documentation

## 2014-01-21 DIAGNOSIS — J479 Bronchiectasis, uncomplicated: Secondary | ICD-10-CM | POA: Diagnosis not present

## 2014-01-21 DIAGNOSIS — I272 Pulmonary hypertension, unspecified: Secondary | ICD-10-CM

## 2014-01-21 HISTORY — DX: Heart failure, unspecified: I50.9

## 2014-01-22 ENCOUNTER — Encounter (HOSPITAL_COMMUNITY)
Admission: RE | Admit: 2014-01-22 | Discharge: 2014-01-22 | Disposition: A | Discharge status: Home / Self Care | Payer: BC Managed Care – PPO | Source: Ambulatory Visit | Attending: Internal Medicine | Admitting: Internal Medicine

## 2014-01-22 DIAGNOSIS — J9610 Chronic respiratory failure, unspecified whether with hypoxia or hypercapnia: Secondary | ICD-10-CM | POA: Insufficient documentation

## 2014-01-22 DIAGNOSIS — J479 Bronchiectasis, uncomplicated: Secondary | ICD-10-CM | POA: Diagnosis not present

## 2014-01-22 DIAGNOSIS — Z5189 Encounter for other specified aftercare: Secondary | ICD-10-CM | POA: Insufficient documentation

## 2014-01-22 NOTE — Progress Notes (Signed)
Deborah [name] 63 y.o. female Pulmonary Rehab Orientation Note Patient arrived today in Cardiac and Pulmonary Rehab for orientation to Pulmonary Rehab. She was transported from [employer] via wheel chair. She does carry portable oxygen. Per pt, she uses oxygen continuously at 4 liters, pulse when she is using her portable concentrator. Color good, skin warm and dry. Patient is oriented to time and place. Patient's medical history and medications reviewed. Heart rate is tachycardic, breath sounds moderate expiratory wheezing heard both upper lobes with rhonchi that clears with cough. Bilateral lower lobes diminished. Grip strength equal, strong. Distal pulses palpable. Patient reports she does take medications as prescribed. Patient states she follows a Low Sodium, fluid restricted diet. The patient reports no specific efforts to gain or lose weight. She did state she has lost a lot of weight since her illness has progressed. Patient's weight will be monitored closely. Demonstration and practice of PLB using pulse oximeter. Patient able to return demonstration satisfactorily. Safety and hand hygiene in the exercise area reviewed with patient. Patient voices understanding of the information reviewed. Department expectations discussed with patient and achievable goals were set. The patient shows enthusiasm about attending the program and we look forward to working with this nice gentleman. The patient is scheduled for a 6 min walk test on Tuesday 12/1 and to begin exercise on Thursday 12/3 at 1:30.   45 minutes was spent on a variety of activities such as assessment of the patient, obtaining baseline data including height, weight, BMI, and grip strength, verifying medical history, allergies, and current medications, and teaching patient strategies for performing tasks with less respiratory effort with emphasis on pursed lip breathing.

## 2014-01-22 NOTE — Progress Notes (Signed)
Deborah [name] completed a Six-Minute Walk Test on 01/22/14 . Deborah [name] walked 445 feet with 0 breaks.  The patient's lowest oxygen saturation was 91% , highest heart rate was 128 , and highest blood pressure was 144/80. The patient was on 4 liters of oxygen with a nasal cannula. Deborah [name] stated that slight fatigue hindered their walk test.

## 2014-01-24 ENCOUNTER — Encounter (HOSPITAL_COMMUNITY)
Admission: RE | Admit: 2014-01-24 | Discharge: 2014-01-24 | Disposition: A | Discharge status: Home / Self Care | Payer: BC Managed Care – PPO | Source: Ambulatory Visit | Attending: Internal Medicine | Admitting: Internal Medicine

## 2014-01-24 DIAGNOSIS — Z5189 Encounter for other specified aftercare: Secondary | ICD-10-CM | POA: Diagnosis not present

## 2014-01-24 NOTE — Progress Notes (Signed)
Today, Deborah [name] exercised at [employer]. Service time was from 1330 to 1550.  The patient exercised for more than 31 minutes performing aerobic, strengthening, and stretching exercises. Oxygen saturation, heart rate, blood pressure, rate of perceived exertion, and shortness of breath were all monitored before, during, and after exercise. Deborah [name] presented with no problems at today's exercise session. Deborah [name] also attended an education session on pulmonary medication.  There was no workload change during today's exercise session.  Pre-exercise vitals: . Weight kg: 54.3 . Liters of O2: 4L . SpO2: 94 . HR: 118 . BP: 114/60 . CBG: na  Exercise vitals: . Highest heartrate:  122 . Lowest oxygen saturation: 92 . Highest blood pressure: 132/76 . Liters of 02: 4L  Post-exercise vitals: . SpO2: 93 . HR: 118 . BP: 84/56 increased to 136/82 . Liters of O2: 4L . CBG: na  Dr. Brand Males, Medical Director Dr. Tana Coast is immediately available during today's Pulmonary Rehab session for [place] on 01/24/2014 at 1330 class time.

## 2014-01-29 ENCOUNTER — Encounter (HOSPITAL_COMMUNITY)
Admission: RE | Admit: 2014-01-29 | Discharge: 2014-01-29 | Disposition: A | Discharge status: Home / Self Care | Payer: BC Managed Care – PPO | Source: Ambulatory Visit | Attending: Internal Medicine | Admitting: Internal Medicine

## 2014-01-29 DIAGNOSIS — Z5189 Encounter for other specified aftercare: Secondary | ICD-10-CM | POA: Diagnosis not present

## 2014-01-29 NOTE — Progress Notes (Signed)
Today, [place] exercised at [facility]. Service time was from 1:30pm to 3:15pm.  The patient exercised for more than 31 minutes performing aerobic, strengthening, and stretching exercises. Oxygen saturation, heart rate, blood pressure, rate of perceived exertion, and shortness of breath were all monitored before, during, and after exercise. Ubah presented with no problems at today's exercise session.   There was no workload change during today's exercise session.  Pre-exercise vitals: . Weight kg: 54.4 . Liters of O2: 4 . SpO2: 90 . HR: 120 . BP: 126/60 . CBG: na  Exercise vitals: . Highest heartrate:  132 . Lowest oxygen saturation: 89% . Highest blood pressure: 170/66 . Liters of 02: 4  Post-exercise vitals: . SpO2: 99 . HR: 117 . BP: 132/80 . Liters of O2: 4 . CBG: na  Dr. Brand Males, Medical Director Dr. Tana Coast is immediately available during today's Pulmonary Rehab session for [facility] on 01/29/14 at 1:30pm class time.

## 2014-01-31 ENCOUNTER — Encounter (HOSPITAL_COMMUNITY)
Admission: RE | Admit: 2014-01-31 | Discharge: 2014-01-31 | Disposition: A | Discharge status: Home / Self Care | Payer: BC Managed Care – PPO | Source: Ambulatory Visit | Attending: Internal Medicine | Admitting: Internal Medicine

## 2014-01-31 DIAGNOSIS — Z5189 Encounter for other specified aftercare: Secondary | ICD-10-CM | POA: Diagnosis not present

## 2014-01-31 NOTE — Progress Notes (Signed)
Today, Bonni exercised at [employer] [facility]. Service time was from 1340 to 1510.  The patient exercised for more than 31 minutes performing aerobic, strengthening, and stretching exercises. Oxygen saturation, heart rate, blood pressure, rate of perceived exertion, and shortness of breath were all monitored before, during, and after exercise. Malory presented with no problems at today's exercise session. Kristeena also attended an education session on advanced directives.  There was no workload change during today's exercise session.  Pre-exercise vitals: . Weight kg: 54.8 . Liters of O2: 4L . SpO2: 93 . HR: 119 . BP: 122/56 . CBG: na  Exercise vitals: . Highest heartrate:  132 down to 119 . Lowest oxygen saturation: 86 increased to 94 with rest and PLB . Highest blood pressure: 142/66 . Liters of 02: 4L  Post-exercise vitals: . SpO2: 99 . HR: 102 . BP: 116/68 . Liters of O2: 4L . CBG: na  Dr. Brand Males, Medical Director Dr. [name] Pear is immediately available during today's Pulmonary Rehab session for [place] on 01/31/2014 at 1340 class time.

## 2014-02-05 ENCOUNTER — Encounter (HOSPITAL_COMMUNITY)
Admission: RE | Admit: 2014-02-05 | Discharge: 2014-02-05 | Disposition: A | Discharge status: Home / Self Care | Payer: BC Managed Care – PPO | Source: Ambulatory Visit | Attending: Internal Medicine | Admitting: Internal Medicine

## 2014-02-05 DIAGNOSIS — Z5189 Encounter for other specified aftercare: Secondary | ICD-10-CM | POA: Diagnosis not present

## 2014-02-05 NOTE — Progress Notes (Signed)
Deborah [name] 63 y.o. female Nutrition Note Spoke with pt. Pt is at a normal wt for her ht and has been maintaining her wt over the past year.  There are some ways the pt can make her eating habits healthier. Pt's Rate Your Plate results reviewed with pt. Pt avoids salty food; does not use canned/ convenience food.  Pt does not eat out on a weekly basis and does not add salt to food. Pt aware of the role of sodium in lung disease and specifically related to pulmonary HTN. Pt is following a 2 L fluid restriction. Pt states her husband helps her with the grocery shopping and cooking. Ways for pt to help her husband in the kitchen before dinner time discussed. Pt c/o dry mouth likely due to "oxygen, medications, and fluid restriction." Ways to decrease dry mouth discussed. Pt expressed understanding of the information reviewed.   Nutrition Diagnosis ? Food-and nutrition-related knowledge deficit related to lack of exposure to information as related to diagnosis of pulmonary disease ?  Nutrition Rx/Est. Daily Nutrition Needs for: ? maintenance 1700-1900 Kcal  65-85 gm protein   1500 mg or less sodium      Nutrition Intervention ? Pt's individual nutrition plan and goals reviewed with pt. ? Benefits of adopting healthy eating habits discussed when pt's Rate Your Plate reviewed. ? Handout for: Local caterers that adjust food to pt nutrition needs, Nutrition II class handouts (includes Ways to Eating Out and Make Healthy Choices) ? Pt to attend the Nutrition and Lung Disease class ? Continual client-centered nutrition education by RD, as part of interdisciplinary care. Goal(s) 1. The pt will recognize symptoms that can interfere with adequate oral intake, such as shortness of breath, N/V, early satiety, fatigue, ability to secure and prepare food, taste and smell changes, chewing/swallowing difficulties, and/ or pain when eating. Monitor and Evaluate progress toward nutrition goal with team.   Derek Mound, M.Ed, RD, LDN, CDE 02/05/2014 2:50 PM

## 2014-02-05 NOTE — Progress Notes (Signed)
Today, Deborah [name] exercised at [employer] [facility]. Service time was from 1330 to 1500.  The patient exercised for more than 31 minutes performing aerobic, strengthening, and stretching exercises. Oxygen saturation, heart rate, blood pressure, rate of perceived exertion, and shortness of breath were all monitored before, during, and after exercise. Deborah [name] presented with frequent desaturations at today's exercise session.   There was no workload change during today's exercise session.  Pre-exercise vitals: . Weight kg: 54.8 . Liters of O2: 4 . SpO2: 86 which increased to 92 . HR: 126 . BP: 120/58 . CBG: na  Exercise vitals: . Highest heartrate:  128 . Lowest oxygen saturation: 81 which increased to 92 with rest and purse lip breathing . Highest blood pressure: 144/80 . Liters of 02: 4  Post-exercise vitals: . SpO2: 93 . HR: 118 . BP: 120/60 . Liters of O2: 4 . CBG: na Dr. Brand Males, Medical Director Dr. [name] Pear is immediately available during today's Pulmonary Rehab session for Deborah [name] on 02/05/2014 at 1330 class time.  Marland Kitchen

## 2014-02-07 ENCOUNTER — Encounter (HOSPITAL_COMMUNITY): Payer: BC Managed Care – PPO

## 2014-02-12 ENCOUNTER — Encounter (HOSPITAL_COMMUNITY): Payer: BC Managed Care – PPO

## 2014-02-14 ENCOUNTER — Encounter (HOSPITAL_COMMUNITY): Payer: BC Managed Care – PPO

## 2014-02-18 ENCOUNTER — Telehealth (HOSPITAL_COMMUNITY): Payer: Self-pay | Admitting: *Deleted

## 2014-02-19 ENCOUNTER — Encounter (HOSPITAL_COMMUNITY): Admission: RE | Admit: 2014-02-19 | Payer: BC Managed Care – PPO | Source: Ambulatory Visit

## 2014-02-21 ENCOUNTER — Encounter (HOSPITAL_COMMUNITY)
Admission: RE | Admit: 2014-02-21 | Discharge: 2014-02-21 | Disposition: A | Discharge status: Home / Self Care | Payer: BC Managed Care – PPO | Source: Ambulatory Visit | Attending: Internal Medicine | Admitting: Internal Medicine

## 2014-02-21 DIAGNOSIS — Z5189 Encounter for other specified aftercare: Secondary | ICD-10-CM | POA: Diagnosis not present

## 2014-02-21 NOTE — Progress Notes (Signed)
Today, Letina exercised at [employer] [facility]. Service time was from 1330 to 1530.  The patient exercised for more than 31 minutes performing aerobic, strengthening, and stretching exercises. Oxygen saturation, heart rate, blood pressure, rate of perceived exertion, and shortness of breath were all monitored before, during, and after exercise. Mishka presented with no problems at today's exercise session. Patient attended the warning signs and symptoms class today.  There was no workload change during today's exercise session.  Pre-exercise vitals: . Weight kg: 54.3 . Liters of O2: 4 . SpO2: 92 . HR: 119 . BP: 148/70 . CBG: na  Exercise vitals: . Highest heartrate:  119 . Lowest oxygen saturation: 91 . Highest blood pressure: 156/70 . Liters of 02: 4  Post-exercise vitals: . SpO2: 98 . HR: 107 . BP: 140/80 . Liters of O2: 4 . CBG: na Dr. Brand Males, Medical Director Dr. Candiss Norse is immediately available during today's Pulmonary Rehab session for [place] on 02/21/2014 at 1330  class time.  Marland Kitchen

## 2014-02-26 ENCOUNTER — Encounter (HOSPITAL_COMMUNITY)
Admission: RE | Admit: 2014-02-26 | Discharge: 2014-02-26 | Disposition: A | Discharge status: Home / Self Care | Payer: BLUE CROSS/BLUE SHIELD | Source: Ambulatory Visit | Attending: Internal Medicine | Admitting: Internal Medicine

## 2014-02-26 DIAGNOSIS — Z5189 Encounter for other specified aftercare: Secondary | ICD-10-CM | POA: Diagnosis not present

## 2014-02-26 DIAGNOSIS — J9610 Chronic respiratory failure, unspecified whether with hypoxia or hypercapnia: Secondary | ICD-10-CM | POA: Diagnosis not present

## 2014-02-26 DIAGNOSIS — J479 Bronchiectasis, uncomplicated: Secondary | ICD-10-CM | POA: Insufficient documentation

## 2014-02-26 NOTE — Progress Notes (Signed)
Today, Deborah [name] exercised at [employer] [facility]. Service time was from 1330 to 1515.  The patient exercised for more than 31 minutes performing aerobic, strengthening, and stretching exercises. Oxygen saturation, heart rate, blood pressure, rate of perceived exertion, and shortness of breath were all monitored before, during, and after exercise. Deborah [name] presented with no problems at today's exercise session.   There was a workload change during today's exercise session.  Pre-exercise vitals: . Weight kg: 54.2 . Liters of O2: 4L . SpO2: 96 . HR: 119 . BP: 108/56 . CBG: na  Exercise vitals: . Highest heartrate:  120 . Lowest oxygen saturation: 92 . Highest blood pressure: 130/70 . Liters of 02: 4L  Post-exercise vitals: . SpO2: 100 . HR: 111 . BP: 120/70 . Liters of O2: 4L . CBG: na  Dr. Brand Males, Medical Director Dr. [name] Pear is immediately available during today's Pulmonary Rehab session for [name] on 02/26/2014 at 1330 class time.

## 2014-02-28 ENCOUNTER — Encounter (HOSPITAL_COMMUNITY)
Admission: RE | Admit: 2014-02-28 | Discharge: 2014-02-28 | Disposition: A | Discharge status: Home / Self Care | Payer: BLUE CROSS/BLUE SHIELD | Source: Ambulatory Visit | Attending: Internal Medicine | Admitting: Internal Medicine

## 2014-02-28 DIAGNOSIS — Z5189 Encounter for other specified aftercare: Secondary | ICD-10-CM | POA: Diagnosis not present

## 2014-02-28 NOTE — Progress Notes (Signed)
Today, Marzelle exercised at [employer] [facility]. Service time was from 1330 to 1530.  The patient exercised for more than 31 minutes performing aerobic, strengthening, and stretching exercises. Oxygen saturation, heart rate, blood pressure, rate of perceived exertion, and shortness of breath were all monitored before, during, and after exercise. Rozena presented with no problems at today's exercise session. Patient attended the Risk Factor Reduction class today.  There was no workload change during today's exercise session.  Pre-exercise vitals: . Weight kg: 54.0 . Liters of O2: 4 . SpO2: 98 . HR: 110 . BP: 108/40 . CBG: na  Exercise vitals: . Highest heartrate:  128 . Lowest oxygen saturation: 90 . Highest blood pressure: 164/80 . Liters of 02: 4  Post-exercise vitals: . SpO2: 99 . HR: 102 . BP: 104/64 . Liters of O2: 4 . CBG: na Dr. Brand Males, Medical Director Dr. [name] Pear is immediately available during today's Pulmonary Rehab session for [place] on 02/28/2014 at 1330 class time.  Marland Kitchen

## 2014-02-28 NOTE — Progress Notes (Signed)
Today, Deborah [name] exercised at [facility]. Service time was from 1330 to 1530.  The patient exercised for more than 31 minutes performing aerobic, strengthening, and stretching exercises. Oxygen saturation, heart rate, blood pressure, rate of perceived exertion, and shortness of breath were all monitored before, during, and after exercise. Deborah [name] presented with no problems at today's exercise session. Patient attended the Risk Factor Reduction class today.  There was no workload change during today's exercise session.  Pre-exercise vitals: . Weight kg: 79.9 . Liters of O2: ra . SpO2: 96 . HR: 69 . BP: 142/72 . CBG: na  Exercise vitals: . Highest heartrate:  87 . Lowest oxygen saturation: 92 . Highest blood pressure: 170/100 . Liters of 02: ra  Post-exercise vitals: . SpO2: 94 . HR: 76 . BP: 140/76 . Liters of O2: ra . CBG: na Dr. Brand Males, Medical Director Dr. [name] Pear is immediately available during today's Pulmonary Rehab session for [place] on 02/28/2014 at 1330 class time.  Marland Kitchen

## 2014-03-05 ENCOUNTER — Ambulatory Visit: Payer: BC Managed Care – PPO | Admitting: Pulmonary Disease

## 2014-03-05 ENCOUNTER — Encounter (HOSPITAL_COMMUNITY)
Admission: RE | Admit: 2014-03-05 | Discharge: 2014-03-05 | Disposition: A | Discharge status: Home / Self Care | Payer: BLUE CROSS/BLUE SHIELD | Source: Ambulatory Visit | Attending: Internal Medicine | Admitting: Internal Medicine

## 2014-03-05 DIAGNOSIS — Z5189 Encounter for other specified aftercare: Secondary | ICD-10-CM | POA: Diagnosis not present

## 2014-03-05 NOTE — Progress Notes (Signed)
Today, Deborah [name] exercised at [facility]. Service time was from 1330 to 1515.  The patient exercised for more than 31 minutes performing aerobic, strengthening, and stretching exercises. Oxygen saturation, heart rate, blood pressure, rate of perceived exertion, and shortness of breath were all monitored before, during, and after exercise. Tacy presented with no problems at today's exercise session.   There was no workload change during today's exercise session.  Pre-exercise vitals: . Weight kg: 53.8 . Liters of O2: 4L . SpO2: 97 . HR: 118 . BP: 140/70 . CBG: na  Exercise vitals: . Highest heartrate:  128 . Lowest oxygen saturation: 88 increased to 91 with PLB . Highest blood pressure: 124/60 . Liters of 02: 4L  Post-exercise vitals: . SpO2: 100 . HR: 112 . BP: 126/64 . Liters of O2: 4L . CBG: na  Dr. Brand Males, Medical Director Dr. [name] Pear is immediately available during today's Pulmonary Rehab session for [name] on 03/05/2014 at 1330 class time.

## 2014-03-07 ENCOUNTER — Encounter (HOSPITAL_COMMUNITY): Payer: BLUE CROSS/BLUE SHIELD

## 2014-03-12 ENCOUNTER — Encounter (HOSPITAL_COMMUNITY)
Admission: RE | Admit: 2014-03-12 | Discharge: 2014-03-12 | Disposition: A | Discharge status: Home / Self Care | Payer: BLUE CROSS/BLUE SHIELD | Source: Ambulatory Visit | Attending: Internal Medicine | Admitting: Internal Medicine

## 2014-03-12 DIAGNOSIS — Z5189 Encounter for other specified aftercare: Secondary | ICD-10-CM | POA: Diagnosis not present

## 2014-03-12 NOTE — Progress Notes (Signed)
Today, Deborah [name] exercised at [facility]. Service time was from 1330 to 1500.  The patient exercised for more than 31 minutes performing aerobic, strengthening, and stretching exercises. Oxygen saturation, heart rate, blood pressure, rate of perceived exertion, and shortness of breath were all monitored before, during, and after exercise. Deborah [name] presented with no problems at today's exercise session.   There was one workload change during today's exercise session.  Pre-exercise vitals: . Weight kg: 54.3 . Liters of O2: 4 . SpO2: 93 . HR: 120 . BP: 112/58 . CBG: na  Exercise vitals: . Highest heartrate:  132 . Lowest oxygen saturation: 89 . Highest blood pressure: 140/70 . Liters of 02: 4  Post-exercise vitals: . SpO2: 97 . HR: 107 . BP: 112/60 . Liters of O2: 4 . CBG: na Dr. Brand Males, Medical Director Dr. Algis Liming is immediately available during today's Pulmonary Rehab session for Deborah [name] on 03/12/2014 at 1330 class time.  Marland Kitchen

## 2014-03-14 ENCOUNTER — Encounter (HOSPITAL_COMMUNITY)
Admission: RE | Admit: 2014-03-14 | Discharge: 2014-03-14 | Disposition: A | Discharge status: Home / Self Care | Payer: BLUE CROSS/BLUE SHIELD | Source: Ambulatory Visit | Attending: Internal Medicine | Admitting: Internal Medicine

## 2014-03-14 DIAGNOSIS — Z5189 Encounter for other specified aftercare: Secondary | ICD-10-CM | POA: Diagnosis not present

## 2014-03-14 NOTE — Progress Notes (Addendum)
Today, [name] exercised at [facility]. Service time was from 1330 to 1530.  The patient exercised for more than 31 minutes performing aerobic, strengthening, and stretching exercises. Oxygen saturation, heart rate, blood pressure, rate of perceived exertion, and shortness of breath were all monitored before, during, and after exercise. [name] presented with no problems at today's exercise session. [name] also attended an education session on oxygen use and safety.  There was no workload change during today's exercise session.  Pre-exercise vitals: . Weight kg: 54.6 . Liters of O2: 4L . SpO2: 92 . HR: 117 . BP: 170/84 . CBG: na  Exercise vitals: . Highest heartrate:  126 . Lowest oxygen saturation: 91 . Highest blood pressure: 166/68 . Liters of 02: 4L  Post-exercise vitals: . SpO2: 100 . HR: 107 . BP: 108/64 . Liters of O2: 4L . CBG: na  Dr. Brand Males, Medical Director Dr. Daleen Bo is immediately available during today's Pulmonary Rehab session for Deborah [name] on 03/14/2014 at 1330 class time.

## 2014-03-19 ENCOUNTER — Encounter (HOSPITAL_COMMUNITY)
Admission: RE | Admit: 2014-03-19 | Discharge: 2014-03-19 | Disposition: A | Discharge status: Home / Self Care | Payer: BLUE CROSS/BLUE SHIELD | Source: Ambulatory Visit | Attending: Internal Medicine | Admitting: Internal Medicine

## 2014-03-19 DIAGNOSIS — Z5189 Encounter for other specified aftercare: Secondary | ICD-10-CM | POA: Diagnosis not present

## 2014-03-19 NOTE — Progress Notes (Signed)
Today, Zenith exercised at [facility]. Service time was from 1:30pm to 2:45pm.  The patient exercised for more than 31 minutes performing aerobic, strengthening, and stretching exercises. Oxygen saturation, heart rate, blood pressure, rate of perceived exertion, and shortness of breath were all monitored before, during, and after exercise. Deborah [name] presented with no problems at today's exercise session.   There was an increase in workload change during today's exercise session.  Pre-exercise vitals: . Weight kg: 55.1 . Liters of O2: 4 . SpO2: 97 . HR: 122 . BP: 112/60 . CBG: na  Exercise vitals: . Highest heartrate:  133 . Lowest oxygen saturation: 90 . Highest blood pressure: 122/60 . Liters of 02: 4  Post-exercise vitals: . SpO2: 100 . HR: 111 . BP: 112/76 . Liters of O2: 4 . CBG: na  Dr. Brand Males, Medical Director Dr. Tana Coast is immediately available during today's Pulmonary Rehab session for Deborah [name] on 03/19/14 at 1:30pm class time.

## 2014-03-21 ENCOUNTER — Encounter (HOSPITAL_COMMUNITY)
Admission: RE | Admit: 2014-03-21 | Discharge: 2014-03-21 | Disposition: A | Discharge status: Home / Self Care | Payer: BLUE CROSS/BLUE SHIELD | Source: Ambulatory Visit | Attending: Internal Medicine | Admitting: Internal Medicine

## 2014-03-21 DIAGNOSIS — Z5189 Encounter for other specified aftercare: Secondary | ICD-10-CM | POA: Diagnosis not present

## 2014-03-21 NOTE — Progress Notes (Signed)
Today, Deborah [name] exercised at [facility]. Service time was from 1:30p to 3:20p.  The patient exercised for more than 31 minutes performing aerobic, strengthening, and stretching exercises. Oxygen saturation, heart rate, blood pressure, rate of perceived exertion, and shortness of breath were all monitored before, during, and after exercise. [name] presented with no problems at today's exercise session. The patient attended education with Deborah [name] on Advanced Directives.   There was no workload change during today's exercise session.  Pre-exercise vitals: . Weight kg: 54.8 . Liters of O2: 4 . SpO2: 96 . HR: 119 . BP: 155/80 . CBG: na  Exercise vitals: . Highest heartrate:  127 . Lowest oxygen saturation: 92 . Highest blood pressure: 164/84 . Liters of 02: 4  Post-exercise vitals: . SpO2: 98 . HR: 108 . BP: 114/58 . Liters of O2: 4 . CBG: na  Dr. Brand Males, Medical Director Dr. Tana Coast is immediately available during today's Pulmonary Rehab session for Deborah [name] on 03/21/14 at 1:30p class time.

## 2014-03-26 ENCOUNTER — Encounter (HOSPITAL_COMMUNITY)
Admission: RE | Admit: 2014-03-26 | Discharge: 2014-03-26 | Disposition: A | Discharge status: Home / Self Care | Payer: BLUE CROSS/BLUE SHIELD | Source: Ambulatory Visit | Attending: Internal Medicine | Admitting: Internal Medicine

## 2014-03-26 DIAGNOSIS — J479 Bronchiectasis, uncomplicated: Secondary | ICD-10-CM | POA: Insufficient documentation

## 2014-03-26 DIAGNOSIS — J9610 Chronic respiratory failure, unspecified whether with hypoxia or hypercapnia: Secondary | ICD-10-CM | POA: Insufficient documentation

## 2014-03-26 DIAGNOSIS — Z5189 Encounter for other specified aftercare: Secondary | ICD-10-CM | POA: Insufficient documentation

## 2014-03-26 NOTE — Progress Notes (Signed)
Today, Deborah [name] exercised at [facility]. Service time was from 1330 to 1500.  The patient exercised for more than 31 minutes performing aerobic, strengthening, and stretching exercises. Oxygen saturation, heart rate, blood pressure, rate of perceived exertion, and shortness of breath were all monitored before, during, and after exercise. Deborah [name] presented with no problems at today's exercise session.   There was no workload change during today's exercise session.  Pre-exercise vitals: . Weight kg: 54.8 . Liters of O2: 4L . SpO2: 97 . HR: 118 . BP: 132/62 . CBG: na  Exercise vitals: . Highest heartrate:  131 . Lowest oxygen saturation: 89 increased to 91 with PLB and rest break . Highest blood pressure: 146/80 . Liters of 02: 4L  Post-exercise vitals: . SpO2: 100 . HR: 107 . BP: 124/60 . Liters of O2: 4L . CBG: na  Dr. Brand Males, Medical Director Dr. Tana Coast is immediately available during today's Pulmonary Rehab session for Deborah [name] on 03/26/2014 at 1330 class time.

## 2014-03-26 NOTE — Progress Notes (Signed)
I have reviewed a Home Exercise Prescription with Deborah [name] . Deborah [name] is not currently exercising at home.  The patient was advised to walk 2-3 days a week for 15 minutes.  Deborah [name] and I discussed how to progress their exercise prescription.  The patient stated that their goals were to be able to walk to the barn to see her horses and increase overall walking endurance.  The patient stated that they understand the exercise prescription.  We reviewed exercise guidelines, target heart rate during exercise, oxygen use, weather, home pulse oximeter, endpoints for exercise, and goals.  Patient is encouraged to come to me with any questions. I will continue to follow up with the patient to assist them with progression and safety.

## 2014-03-28 ENCOUNTER — Encounter (HOSPITAL_COMMUNITY): Payer: BLUE CROSS/BLUE SHIELD

## 2014-04-02 ENCOUNTER — Encounter (HOSPITAL_COMMUNITY)
Admission: RE | Admit: 2014-04-02 | Discharge: 2014-04-02 | Disposition: A | Discharge status: Home / Self Care | Payer: BLUE CROSS/BLUE SHIELD | Source: Ambulatory Visit | Attending: Internal Medicine | Admitting: Internal Medicine

## 2014-04-02 DIAGNOSIS — J479 Bronchiectasis, uncomplicated: Secondary | ICD-10-CM | POA: Diagnosis not present

## 2014-04-02 DIAGNOSIS — Z5189 Encounter for other specified aftercare: Secondary | ICD-10-CM | POA: Diagnosis not present

## 2014-04-02 DIAGNOSIS — J9610 Chronic respiratory failure, unspecified whether with hypoxia or hypercapnia: Secondary | ICD-10-CM | POA: Diagnosis not present

## 2014-04-02 NOTE — Progress Notes (Signed)
Today, Antania exercised at [facility]. Service time was from 1330 to 1515.  The patient exercised for more than 31 minutes performing aerobic, strengthening, and stretching exercises. Oxygen saturation, heart rate, blood pressure, rate of perceived exertion, and shortness of breath were all monitored before, during, and after exercise. Deborah [name] presented with no problems at today's exercise session.   There was no workload change during today's exercise session.  Pre-exercise vitals: . Weight kg: 55.0 . Liters of O2: 4 . SpO2: 96 . HR: 115 . BP: 142/60 . CBG: na  Exercise vitals: . Highest heartrate:  122 . Lowest oxygen saturation: 93 . Highest blood pressure: 168/74 . Liters of 02: 4  Post-exercise vitals: . SpO2: 98 . HR: 106 . BP: 118/60 . Liters of O2: 4 . CBG: na Dr. Brand Males, Medical Director Dr. Wyline Copas is immediately available during today's Pulmonary Rehab session for Winry Egnew Guttman on 04/02/2014 at 1330 class time.  Marland Kitchen

## 2014-04-04 ENCOUNTER — Encounter (HOSPITAL_COMMUNITY)
Admission: RE | Admit: 2014-04-04 | Discharge: 2014-04-04 | Disposition: A | Discharge status: Home / Self Care | Payer: BLUE CROSS/BLUE SHIELD | Source: Ambulatory Visit | Attending: Internal Medicine | Admitting: Internal Medicine

## 2014-04-04 DIAGNOSIS — Z5189 Encounter for other specified aftercare: Secondary | ICD-10-CM | POA: Diagnosis not present

## 2014-04-04 NOTE — Progress Notes (Signed)
Today, Deborah [name] exercised at [facility]. Service time was from 1330 to 1545.  The patient exercised for more than 31 minutes performing aerobic, strengthening, and stretching exercises. Oxygen saturation, heart rate, blood pressure, rate of perceived exertion, and shortness of breath were all monitored before, during, and after exercise. Deborah [name] presented with no problems at today's exercise session. Patient attended the [place] services class today.  There was no workload change during today's exercise session.  Pre-exercise vitals: . Weight kg: 54.9 . Liters of O2: 4 . SpO2: 91 . HR: 114 . BP: 110/54 . CBG: na  Exercise vitals: . Highest heartrate:  118 . Lowest oxygen saturation: 88 which increased to 90 with rest  . Highest blood pressure: 148/62 . Liters of 02: 4  Post-exercise vitals: . SpO2: 92 . HR: 107 . BP: 116/66 . Liters of O2: 4 . CBG: na Dr. Brand Males, Medical Director Dr. Candiss Norse is immediately available during today's Pulmonary Rehab session for Deborah [name] on 04/04/2014 at 1330 class time.  Marland Kitchen

## 2014-04-09 ENCOUNTER — Encounter (HOSPITAL_COMMUNITY)
Admission: RE | Admit: 2014-04-09 | Discharge: 2014-04-09 | Disposition: A | Discharge status: Home / Self Care | Payer: BLUE CROSS/BLUE SHIELD | Source: Ambulatory Visit | Attending: Internal Medicine | Admitting: Internal Medicine

## 2014-04-09 DIAGNOSIS — Z5189 Encounter for other specified aftercare: Secondary | ICD-10-CM | POA: Diagnosis not present

## 2014-04-09 NOTE — Progress Notes (Signed)
Today, Deborah [name] exercised at [facility]. Service time was from 1330 to 1500.  The patient exercised for more than 31 minutes performing aerobic, strengthening, and stretching exercises. Oxygen saturation, heart rate, blood pressure, rate of perceived exertion, and shortness of breath were all monitored before, during, and after exercise. Deborah [name] presented with no problems at today's exercise session.   There was no workload change during today's exercise session.  Pre-exercise vitals: . Weight kg: 55.5 . Liters of O2: 4L . SpO2: 90 increased to 95 with PLB . HR: 121 . BP: 118/50 . CBG: na  Exercise vitals: . Highest heartrate:  125 . Lowest oxygen saturation: 91 . Highest blood pressure: 150/64 . Liters of 02: 4L  Post-exercise vitals: . SpO2: 100 . HR: 111 . BP: 116/68 . Liters of O2: 4L . CBG: na  Dr. Brand Males, Medical Director Dr. Candiss Norse is immediately available during today's Pulmonary Rehab session for Deborah [name] on 04/09/2014 at 1330 class time.

## 2014-04-11 ENCOUNTER — Encounter (HOSPITAL_COMMUNITY)
Admission: RE | Admit: 2014-04-11 | Discharge: 2014-04-11 | Disposition: A | Discharge status: Home / Self Care | Payer: BLUE CROSS/BLUE SHIELD | Source: Ambulatory Visit | Attending: Internal Medicine | Admitting: Internal Medicine

## 2014-04-11 DIAGNOSIS — Z5189 Encounter for other specified aftercare: Secondary | ICD-10-CM | POA: Diagnosis not present

## 2014-04-11 NOTE — Progress Notes (Signed)
Today, Ajeenah exercised at [facility]. Service time was from 1330 to 1515.  The patient exercised for more than 31 minutes performing aerobic, strengthening, and stretching exercises. Oxygen saturation, heart rate, blood pressure, rate of perceived exertion, and shortness of breath were all monitored before, during, and after exercise. Madalee presented with no problems at today's exercise session. Annslee also attended an education session on exercise for the pulmonary patient.  There was a workload change during today's exercise session.  Pre-exercise vitals: . Weight kg: 55.4 . Liters of O2: 4L . SpO2: 91 . HR: 116 . BP: 128/64 . CBG: na  Exercise vitals: . Highest heartrate:  131 . Lowest oxygen saturation: 91 . Highest blood pressure: 116/54 . Liters of 02: 4L  Post-exercise vitals: . SpO2: 100 . HR: 103 . BP: 112/60 . Liters of O2: 4L . CBG: na  Dr. Brand Males, Medical Director Dr. Candiss Norse is immediately available during today's Pulmonary Rehab session for Clarabelle Oscarson Cappelli on 04/11/2014 at 1330 class time.

## 2014-04-16 ENCOUNTER — Encounter (HOSPITAL_COMMUNITY): Payer: BLUE CROSS/BLUE SHIELD

## 2014-04-18 ENCOUNTER — Encounter (HOSPITAL_COMMUNITY)
Admission: RE | Admit: 2014-04-18 | Discharge: 2014-04-18 | Disposition: A | Discharge status: Home / Self Care | Payer: BLUE CROSS/BLUE SHIELD | Source: Ambulatory Visit | Attending: Internal Medicine | Admitting: Internal Medicine

## 2014-04-18 DIAGNOSIS — Z5189 Encounter for other specified aftercare: Secondary | ICD-10-CM | POA: Diagnosis not present

## 2014-04-18 NOTE — Progress Notes (Signed)
Today, Deborah [name] exercised at [facility]. Service time was from 1330 to 1530.  The patient exercised for more than 31 minutes performing aerobic, strengthening, and stretching exercises. Oxygen saturation, heart rate, blood pressure, rate of perceived exertion, and shortness of breath were all monitored before, during, and after exercise. [name] presented with no problems at today's exercise session. Patient attended the Sign and Symptoms class today.  There was no workload change during today's exercise session.  Pre-exercise vitals: . Weight kg: 55.9 . Liters of O2: 4 . SpO2: 93 . HR: 117 . BP: 130/60 . CBG: na  Exercise vitals: . Highest heartrate:  129 . Lowest oxygen saturation: 87 . Highest blood pressure: 164/62 . Liters of 02: 4  Post-exercise vitals: . SpO2: 100 . HR: 106 . BP: 104/60 . Liters of O2: 4 . CBG: na Dr. Brand Males, Medical Director Dr. Candiss Norse is immediately available during today's Pulmonary Rehab session for [name] on 04/18/2014 at 1330 class time.  Marland Kitchen

## 2014-04-23 ENCOUNTER — Encounter (HOSPITAL_COMMUNITY): Payer: BLUE CROSS/BLUE SHIELD

## 2014-04-25 ENCOUNTER — Encounter (HOSPITAL_COMMUNITY)
Admission: RE | Admit: 2014-04-25 | Discharge: 2014-04-25 | Disposition: A | Discharge status: Home / Self Care | Payer: BLUE CROSS/BLUE SHIELD | Source: Ambulatory Visit | Attending: Internal Medicine | Admitting: Internal Medicine

## 2014-04-25 DIAGNOSIS — J479 Bronchiectasis, uncomplicated: Secondary | ICD-10-CM | POA: Diagnosis not present

## 2014-04-25 DIAGNOSIS — J9610 Chronic respiratory failure, unspecified whether with hypoxia or hypercapnia: Secondary | ICD-10-CM | POA: Diagnosis not present

## 2014-04-25 DIAGNOSIS — Z5189 Encounter for other specified aftercare: Secondary | ICD-10-CM | POA: Insufficient documentation

## 2014-04-25 NOTE — Progress Notes (Signed)
Today, Deborah [name] exercised at [facility]. Service time was from 1330 to 1530.  The patient exercised for more than 31 minutes performing aerobic, strengthening, and stretching exercises. Oxygen saturation, heart rate, blood pressure, rate of perceived exertion, and shortness of breath were all monitored before, during, and after exercise. Deborah [name] presented with no problems at today's exercise session. Patient attended the Anatomy, Physiology, and Intimacy class today.    There was no workload change during today's exercise session.  Pre-exercise vitals: . Weight kg: 55.6 . Liters of O2: 3 . SpO2: 90 . HR: 112 . BP: 104/68 . CBG: na  Exercise vitals: . Highest heartrate:  129 . Lowest oxygen saturation: 88 which increased to 94 with rest and purse-lip breathing . Highest blood pressure: 150/78 . Liters of 02: 4  Post-exercise vitals: . SpO2: 97 . HR: 115 . BP: 110/64 . Liters of O2: 4 . CBG: NA Dr. Brand Males, Medical Director Dr. Waldron Labs is immediately available during today's Pulmonary Rehab session for Deborah [name] on 04/25/2014 at 1330 class time.  Marland Kitchen

## 2014-04-30 ENCOUNTER — Encounter (HOSPITAL_COMMUNITY)
Admission: RE | Admit: 2014-04-30 | Discharge: 2014-04-30 | Disposition: A | Discharge status: Home / Self Care | Payer: BLUE CROSS/BLUE SHIELD | Source: Ambulatory Visit | Attending: Internal Medicine | Admitting: Internal Medicine

## 2014-04-30 DIAGNOSIS — Z5189 Encounter for other specified aftercare: Secondary | ICD-10-CM | POA: Diagnosis not present

## 2014-04-30 NOTE — Progress Notes (Signed)
Today, Deborah [name] exercised at [facility]. Service time was from 1330 to 1500.  The patient exercised for more than 31 minutes performing aerobic, strengthening, and stretching exercises. Oxygen saturation, heart rate, blood pressure, rate of perceived exertion, and shortness of breath were all monitored before, during, and after exercise. Deborah [name] presented with no problems at today's exercise session.   There was no workload change during today's exercise session.  Pre-exercise vitals: . Weight kg: 55.9 . Liters of O2: 3 . SpO2: 95 . HR: 120 . BP: 148/68 . CBG: na  Exercise vitals: . Highest heartrate:  130 . Lowest oxygen saturation: 85 . Highest blood pressure: 140/68 . Liters of 02: 4  Post-exercise vitals: . SpO2: 98 . HR: 106 . BP: 104/60 . Liters of O2: 4 . CBG: na Dr. Brand Males, Medical Director Dr. Waldron Labs is immediately available during today's Pulmonary Rehab session for Deborah [name] on 04/30/2014 at 1330 class time.  Marland Kitchen

## 2014-05-02 ENCOUNTER — Encounter (HOSPITAL_COMMUNITY)
Admission: RE | Admit: 2014-05-02 | Discharge: 2014-05-02 | Disposition: A | Discharge status: Home / Self Care | Payer: BLUE CROSS/BLUE SHIELD | Source: Ambulatory Visit | Attending: Internal Medicine | Admitting: Internal Medicine

## 2014-05-02 DIAGNOSIS — Z5189 Encounter for other specified aftercare: Secondary | ICD-10-CM | POA: Diagnosis not present

## 2014-05-02 NOTE — Progress Notes (Signed)
Today, Gratia exercised at [facility]. Service time was from 1330 to 1515.  The patient exercised for more than 31 minutes performing aerobic, strengthening, and stretching exercises. Oxygen saturation, heart rate, blood pressure, rate of perceived exertion, and shortness of breath were all monitored before, during, and after exercise. [place]enne presented with no problems at today's exercise session. Kandis also attended an education session on pulmonary medications.  There was a workload change during today's exercise session.  Pre-exercise vitals: . Weight kg: 56.5 . Liters of O2: 3L . SpO2: 90 . HR: 115 . BP: 130/62 . CBG: na  Exercise vitals: . Highest heartrate:  112 . Lowest oxygen saturation: 92 . Highest blood pressure: 152/64 . Liters of 02: 4L  Post-exercise vitals: . SpO2: 98 . HR: 102 . BP: 124/76 . Liters of O2: 4L . CBG: na  Dr. Brand Males, Medical Director Dr. Waldron Labs is immediately available during today's Pulmonary Rehab session for [place] on 05/02/2014 at 1330 class time.  Marland Kitchen

## 2014-05-07 ENCOUNTER — Encounter (HOSPITAL_COMMUNITY): Payer: BLUE CROSS/BLUE SHIELD

## 2014-05-09 ENCOUNTER — Encounter (HOSPITAL_COMMUNITY)
Admission: RE | Admit: 2014-05-09 | Discharge: 2014-05-09 | Disposition: A | Discharge status: Home / Self Care | Payer: BLUE CROSS/BLUE SHIELD | Source: Ambulatory Visit | Attending: Internal Medicine | Admitting: Internal Medicine

## 2014-05-09 DIAGNOSIS — Z5189 Encounter for other specified aftercare: Secondary | ICD-10-CM | POA: Diagnosis not present

## 2014-05-09 NOTE — Progress Notes (Signed)
Today, Miosha exercised at [facility]. Service time was from 1:30 to 4:15pm.  The patient exercised for more than 31 minutes performing aerobic, strengthening, and stretching exercises. Oxygen saturation, heart rate, blood pressure, rate of perceived exertion, and shortness of breath were all monitored before, during, and after exercise. Maitland presented with no problems at today's exercise session. The patient attended education today with Derek Mound on Pulmonary Nutrition.  There was no increase in workload change during today's exercise session.  Pre-exercise vitals: . Weight kg: 56.3 . Liters of O2: 3 . SpO2: 91 . HR: 112 . BP: 140/58 . CBG: na  Exercise vitals: . Highest heartrate:  126 . Lowest oxygen saturation: 86% . Highest blood pressure: 130/64 . Liters of 02: 4  Post-exercise vitals: . SpO2: 98 . HR: 95 . BP: 112/60 . Liters of O2: 4 . CBG: na  Dr. Brand Males, Medical Director Dr. Frederic Jericho is immediately available during today's Pulmonary Rehab session for Jamye Balicki Daughdrill on 05/09/14 at 1:30pm class time.

## 2014-05-14 ENCOUNTER — Encounter (HOSPITAL_COMMUNITY)
Admission: RE | Admit: 2014-05-14 | Discharge: 2014-05-14 | Disposition: A | Discharge status: Home / Self Care | Payer: BLUE CROSS/BLUE SHIELD | Source: Ambulatory Visit | Attending: Internal Medicine | Admitting: Internal Medicine

## 2014-05-14 DIAGNOSIS — Z5189 Encounter for other specified aftercare: Secondary | ICD-10-CM | POA: Diagnosis not present

## 2014-05-14 NOTE — Progress Notes (Signed)
Today, Deborah [name] exercised at [facility]. Service time was from 1330 to 1530.  The patient exercised by preforming aerobic, strengthening, and stretching exercises. Oxygen saturation, heart rate, blood pressure, rate of perceived exertion, and shortness of breath were all monitored before, during, and after exercise. [name] presented with no problems at today's exercise session. Patient attended the purse lip breathing and diaphragmatic breathing class today.   No workload changes today. Pre-exercise vitals: . Weight kg: 56.3 . Liters of O2: 4 . SpO2: 94 . HR: 119 . BP: 102/54 . CBG: na  Exercise vitals: . Highest heartrate:  125 . Lowest oxygen saturation: 88 . Highest blood pressure: 136/58 . Liters of 02: 4   Post-exercise vitals: . SpO2: 96 . HR: 103 . BP: 124/68 . Liters of O2: 4 . CBG: na Dr. Brand Males, Medical Director Dr. Frederic Jericho is immediately available during today's Pulmonary Rehab session for Deborah [name] on 05/14/2014 at 1330 class time.

## 2014-05-16 ENCOUNTER — Encounter (HOSPITAL_COMMUNITY)
Admission: RE | Admit: 2014-05-16 | Discharge: 2014-05-16 | Disposition: A | Discharge status: Home / Self Care | Payer: BLUE CROSS/BLUE SHIELD | Source: Ambulatory Visit | Attending: Internal Medicine | Admitting: Internal Medicine

## 2014-05-16 DIAGNOSIS — Z5189 Encounter for other specified aftercare: Secondary | ICD-10-CM | POA: Diagnosis not present

## 2014-05-16 NOTE — Progress Notes (Signed)
Today, [name] exercised at [facility]. Service time was from 1330 to 1515.  The patient exercised by performing aerobic, strengthening, and stretching exercises. Oxygen saturation, heart rate, blood pressure, rate of perceived exertion, and shortness of breath were all monitored before, during, and after exercise. Deborah [name] presented with no problems at today's exercise session.Patient attended the risk factor reduction class today.   The patient did not have an increase in workload intensity during today's exercise session.  Pre-exercise vitals: . Weight kg:56.2  . Liters of O2: 4 . SpO2: 94 . HR: 106 . BP: 98/52 . CBG: na  Exercise vitals: . Highest heartrate:  126 . Lowest oxygen saturation: 88 which increased to 91 with rest and purse lip breathing . Highest blood pressure: 138/76 . Liters of 02: 4  Post-exercise vitals: . SpO2: 100 . HR: 110 . BP: 122/74 . Liters of O2: 4 . CBG: na Dr. Brand Males, Medical Director Dr. Sheran Fava is immediately available during today's Pulmonary Rehab session for Deborah [name] on 05/16/2014 at 1330 class time.  Marland Kitchen

## 2014-05-21 ENCOUNTER — Encounter (HOSPITAL_COMMUNITY)
Admission: RE | Admit: 2014-05-21 | Discharge: 2014-05-21 | Disposition: A | Discharge status: Home / Self Care | Payer: BLUE CROSS/BLUE SHIELD | Source: Ambulatory Visit | Attending: Internal Medicine | Admitting: Internal Medicine

## 2014-05-21 DIAGNOSIS — Z5189 Encounter for other specified aftercare: Secondary | ICD-10-CM | POA: Diagnosis not present

## 2014-05-21 NOTE — Progress Notes (Signed)
Today, Deborah [name] exercised at [facility]. Service time was from 1:30p to 3:00p.  The patient exercised by performing aerobic, strengthening, and stretching exercises. Oxygen saturation, heart rate, blood pressure, rate of perceived exertion, and shortness of breath were all monitored before, during, and after exercise. [name] presented with no problems at today's exercise session.  The patient did not have an increase in workload intensity during today's exercise session.  Pre-exercise vitals: . Weight kg: 56.2 . Liters of O2: 3 . SpO2: 96 . HR: 115 . BP: 118/54 . CBG: na  Exercise vitals: . Highest heartrate:  128 . Lowest oxygen saturation: 89% . Highest blood pressure: 140/80 . Liters of 02: 4  Post-exercise vitals: . SpO2: 98 . HR: 103 . BP: 122/74 . Liters of O2: 4 . CBG: na  Dr. Brand Males, Medical Director Dr. Sheran Fava is immediately available during today's Pulmonary Rehab session for Deborah [name] on 05/21/14 at 1:30am class time.

## 2014-05-23 ENCOUNTER — Encounter (HOSPITAL_COMMUNITY)
Admission: RE | Admit: 2014-05-23 | Discharge: 2014-05-23 | Disposition: A | Discharge status: Home / Self Care | Payer: BLUE CROSS/BLUE SHIELD | Source: Ambulatory Visit | Attending: Internal Medicine | Admitting: Internal Medicine

## 2014-05-23 DIAGNOSIS — Z5189 Encounter for other specified aftercare: Secondary | ICD-10-CM | POA: Diagnosis not present

## 2014-05-23 NOTE — Progress Notes (Signed)
Today, Deborah [name] exercised at [facility]. Service time was from 1330 to 1430.  The patient exercised by performing aerobic, strengthening, and stretching exercises. Oxygen saturation, heart rate, blood pressure, rate of perceived exertion, and shortness of breath were all monitored before, during, and after exercise. Saliha presented with no problems at today's exercise session.  The patient did not have an increase in workload intensity during today's exercise session.  Pre-exercise vitals: . Weight kg: 56.2 . Liters of O2: 3 . SpO2: 88 which increased to 90 . HR: 117 . BP: 118/58 . CBG: na  Exercise vitals: . Highest heartrate:  121 . Lowest oxygen saturation: 90 . Highest blood pressure: 150/56 . Liters of 02: 4  Post-exercise vitals: . SpO2: 99 . HR: 106 . BP: 122/60 . Liters of O2: 3 . CBG: na Dr. Brand Males, Medical Director Dr. Aileen Fass is immediately available during today's Pulmonary Rehab session for Deborah [name] on 05/23/2014 at 1330 class time.  Marland Kitchen

## 2014-05-28 ENCOUNTER — Encounter (HOSPITAL_COMMUNITY)
Admission: RE | Admit: 2014-05-28 | Discharge: 2014-05-28 | Disposition: A | Discharge status: Home / Self Care | Payer: BLUE CROSS/BLUE SHIELD | Source: Ambulatory Visit | Attending: Internal Medicine | Admitting: Internal Medicine

## 2014-05-28 DIAGNOSIS — J479 Bronchiectasis, uncomplicated: Secondary | ICD-10-CM | POA: Insufficient documentation

## 2014-05-28 DIAGNOSIS — Z5189 Encounter for other specified aftercare: Secondary | ICD-10-CM | POA: Diagnosis present

## 2014-05-28 DIAGNOSIS — J9610 Chronic respiratory failure, unspecified whether with hypoxia or hypercapnia: Secondary | ICD-10-CM | POA: Insufficient documentation

## 2014-05-28 NOTE — Progress Notes (Signed)
Today, [name] exercised at [facility]. Service time was from 1330 to 1500.  The patient exercised by performing aerobic, strengthening, and stretching exercises. Oxygen saturation, heart rate, blood pressure, rate of perceived exertion, and shortness of breath were all monitored before, during, and after exercise. [name] presented with no problems at today's exercise session.  The patient did not have an increase in workload intensity during today's exercise session.  Pre-exercise vitals: . Weight kg: 56.4 . Liters of O2: 3L . SpO2: 90 . HR: 120 . BP: 100/62 . CBG: na  Exercise vitals: . Highest heartrate:  121 . Lowest oxygen saturation: 86 increased to 91 with rest break . Highest blood pressure: 122/58 . Liters of 02: 4L  Post-exercise vitals: . SpO2: 99 . HR: 104 . BP: 110/70 . Liters of O2: 4L . CBG: na  Dr. Brand Males, Medical Director Dr. Clementeen Graham is immediately available during today's Pulmonary Rehab session for Deborah [name] on 05/28/2014 at 1330 class time.

## 2014-05-30 ENCOUNTER — Encounter (HOSPITAL_COMMUNITY): Admission: RE | Admit: 2014-05-30 | Payer: BLUE CROSS/BLUE SHIELD | Source: Ambulatory Visit

## 2014-06-04 ENCOUNTER — Encounter (HOSPITAL_COMMUNITY)
Admission: RE | Admit: 2014-06-04 | Discharge: 2014-06-04 | Disposition: A | Discharge status: Home / Self Care | Payer: BLUE CROSS/BLUE SHIELD | Source: Ambulatory Visit | Attending: Internal Medicine | Admitting: Internal Medicine

## 2014-06-04 DIAGNOSIS — Z5189 Encounter for other specified aftercare: Secondary | ICD-10-CM | POA: Diagnosis not present

## 2014-06-04 NOTE — Progress Notes (Signed)
Today, Deborah [name] exercised at [facility] [facility]. Service time was from 1:30pm to 2:55pm.  The patient exercised by performing aerobic, strengthening, and stretching exercises. Oxygen saturation, heart rate, blood pressure, rate of perceived exertion, and shortness of breath were all monitored before, during, and after exercise. [name] presented with no problems at today's exercise session.  The patient did not have an increase in workload intensity during today's exercise session.  Pre-exercise vitals: . Weight kg: 56.0 . Liters of O2: 3 . SpO2: 96 . HR: 107 . BP: 98/58 . CBG: na  Exercise vitals: . Highest heartrate:  128 . Lowest oxygen saturation: 91 . Highest blood pressure: 122/58 . Liters of 02: 4  Post-exercise vitals: . SpO2: 100 . HR: 104 . BP: 128/74 . Liters of O2: 4 . CBG: na  Dr. Brand Males, Medical Director Dr. Clementeen Graham is immediately available during today's Pulmonary Rehab session for Kameshia Madruga Broda on 06/04/14 at 1:30pm class time.

## 2014-06-06 ENCOUNTER — Encounter (HOSPITAL_COMMUNITY)
Admission: RE | Admit: 2014-06-06 | Discharge: 2014-06-06 | Disposition: A | Discharge status: Home / Self Care | Payer: BLUE CROSS/BLUE SHIELD | Source: Ambulatory Visit | Attending: Internal Medicine | Admitting: Internal Medicine

## 2014-06-06 DIAGNOSIS — Z5189 Encounter for other specified aftercare: Secondary | ICD-10-CM | POA: Diagnosis not present

## 2014-06-06 NOTE — Progress Notes (Signed)
Today, Deborah [name] exercised at [facility]. Service time was from 1330 to 1515.  The patient exercised by performing aerobic, strengthening, and stretching exercises. Oxygen saturation, heart rate, blood pressure, rate of perceived exertion, and shortness of breath were all monitored before, during, and after exercise. Deborah [name] presented with no problems at today's exercise session.Patient attended the education class which addressed purse-lip breathing and diaphragmatic breathing today.  The patient did not have an increase in workload intensity during today's exercise session.  Pre-exercise vitals: . Weight kg: 56.5 . Liters of O2: 3 . SpO2: 98 . HR: 105 . BP: 142/68 . CBG: na  Exercise vitals: . Highest heartrate:  120 . Lowest oxygen saturation: 90 . Highest blood pressure: 120/64 . Liters of 02: 4  Post-exercise vitals: . SpO2: 99 . HR: 101 . BP: 118/70 . Liters of O2: 4 . CBG: na Dr. Brand Males, Medical Director Dr. Wyline Copas is immediately available during today's Pulmonary Rehab session for Deborah [name] on 06/06/2014 at 1330 class time.  Marland Kitchen

## 2014-06-11 ENCOUNTER — Encounter (HOSPITAL_COMMUNITY)
Admission: RE | Admit: 2014-06-11 | Discharge: 2014-06-11 | Disposition: A | Discharge status: Home / Self Care | Payer: BLUE CROSS/BLUE SHIELD | Source: Ambulatory Visit | Attending: Internal Medicine | Admitting: Internal Medicine

## 2014-06-11 DIAGNOSIS — Z5189 Encounter for other specified aftercare: Secondary | ICD-10-CM | POA: Diagnosis not present

## 2014-06-11 NOTE — Progress Notes (Signed)
Today, Deborah [name] exercised at [facility]. Service time was from 1:30pm to 3:15pm.  The patient exercised by performing aerobic, strengthening, and stretching exercises. Oxygen saturation, heart rate, blood pressure, rate of perceived exertion, and shortness of breath were all monitored before, during, and after exercise. Deborah [name] presented with no problems at today's exercise session.  The patient did have an increase in workload intensity during today's exercise session.  Pre-exercise vitals: . Weight kg: 56.5 . Liters of O2: 3 . SpO2: 94 . HR: 114 . BP: 112/60 . CBG: na  Exercise vitals: . Highest heartrate:  128 . Lowest oxygen saturation: 90 . Highest blood pressure: 128/60 . Liters of 02: 4  Post-exercise vitals: . SpO2: 100 . HR: 104 . BP: 100/60 . Liters of O2: 4 . CBG: na  Dr. Brand Males, Medical Director Dr. Wyline Copas is immediately available during today's Pulmonary Rehab session for [place] on 06/11/14 at 1:30pm class time.

## 2014-06-13 ENCOUNTER — Encounter (HOSPITAL_COMMUNITY)
Admission: RE | Admit: 2014-06-13 | Discharge: 2014-06-13 | Disposition: A | Discharge status: Home / Self Care | Payer: BLUE CROSS/BLUE SHIELD | Source: Ambulatory Visit | Attending: Internal Medicine | Admitting: Internal Medicine

## 2014-06-13 DIAGNOSIS — Z5189 Encounter for other specified aftercare: Secondary | ICD-10-CM | POA: Diagnosis not present

## 2014-06-13 NOTE — Progress Notes (Signed)
Today, [name] exercised at [facility]. Service time was from 1330 to 1530.  The patient exercised by performing aerobic, strengthening, and stretching exercises. Oxygen saturation, heart rate, blood pressure, rate of perceived exertion, and shortness of breath were all monitored before, during, and after exercise. Deborah [name] presented with no problems at today's exercise session.  The patient did not have an increase in workload intensity during today's exercise session.  Pre-exercise vitals: . Weight kg: 56.3 . Liters of O2: 3 . SpO2: 90 . HR: 113 . BP: 120/58 . CBG: na  Exercise vitals: . Highest heartrate:  118 . Lowest oxygen saturation: 90 . Highest blood pressure: 152/64 . Liters of 02: 4  Post-exercise vitals: . SpO2: 100 . HR: 105 . BP: 104/60 . Liters of O2: 4 . CBG: na Dr. Brand Males, Medical Director Dr. Sheran Fava is immediately available during today's Pulmonary Rehab session for Deborah [name] on 06/13/2014 at 1330 class time.  Marland Kitchen

## 2014-06-18 ENCOUNTER — Encounter (HOSPITAL_COMMUNITY)
Admission: RE | Admit: 2014-06-18 | Discharge: 2014-06-18 | Disposition: A | Discharge status: Home / Self Care | Payer: BLUE CROSS/BLUE SHIELD | Source: Ambulatory Visit | Attending: Internal Medicine | Admitting: Internal Medicine

## 2014-06-18 DIAGNOSIS — Z5189 Encounter for other specified aftercare: Secondary | ICD-10-CM | POA: Diagnosis not present

## 2014-06-18 NOTE — Progress Notes (Signed)
Today, Deborah [name] exercised at [facility]. Service time was from 1330 to 1515.  The patient exercised by performing aerobic, strengthening, and stretching exercises. Oxygen saturation, heart rate, blood pressure, rate of perceived exertion, and shortness of breath were all monitored before, during, and after exercise. [name] presented with no problems at today's exercise session.  The patient did not have an increase in workload intensity during today's exercise session.  Pre-exercise vitals: . Weight kg: 56.7 . Liters of O2: 4L . SpO2: 98 . HR: 109 . BP: 94/88 . CBG: na  Exercise vitals: . Highest heartrate:  131 . Lowest oxygen saturation: 90 . Highest blood pressure: 116/58 . Liters of 02: 4L  Post-exercise vitals: . SpO2: 98 . HR: 119 . BP: 126/60 . Liters of O2: 4L . CBG: na  Dr. Brand Males, Medical Director Dr. Sheran Fava is immediately available during today's Pulmonary Rehab session for Deborah [name] on 06/18/2014 at 1330 class time.

## 2014-06-20 ENCOUNTER — Encounter (HOSPITAL_COMMUNITY): Payer: BLUE CROSS/BLUE SHIELD

## 2014-06-25 ENCOUNTER — Encounter (HOSPITAL_COMMUNITY)
Admission: RE | Admit: 2014-06-25 | Discharge: 2014-06-25 | Disposition: A | Discharge status: Home / Self Care | Payer: BLUE CROSS/BLUE SHIELD | Source: Ambulatory Visit | Attending: Internal Medicine | Admitting: Internal Medicine

## 2014-06-25 DIAGNOSIS — J9610 Chronic respiratory failure, unspecified whether with hypoxia or hypercapnia: Secondary | ICD-10-CM | POA: Insufficient documentation

## 2014-06-25 DIAGNOSIS — J479 Bronchiectasis, uncomplicated: Secondary | ICD-10-CM | POA: Diagnosis not present

## 2014-06-25 DIAGNOSIS — Z5189 Encounter for other specified aftercare: Secondary | ICD-10-CM | POA: Diagnosis not present

## 2014-06-25 NOTE — Progress Notes (Signed)
Today, Annais exercised at [facility]. Service time was from 1330 to 1500.  The patient exercised by performing aerobic, strengthening, and stretching exercises. Oxygen saturation, heart rate, blood pressure, rate of perceived exertion, and shortness of breath were all monitored before, during, and after exercise. Khaylee presented with no problems at today's exercise session.  The patient did not have an increase in workload intensity during today's exercise session.  Pre-exercise vitals: . Weight kg: 56.4 . Liters of O2: 3 . SpO2: 91 . HR: 117 . BP: 118/62 . CBG: NA  Exercise vitals: . Highest heartrate:  129 . Lowest oxygen saturation: 90 . Highest blood pressure: 124/62 . Liters of 02: 4  Post-exercise vitals: . SpO2:100 . HR: 104 . BP: 108/70 . Liters of O2: 4 . CBG: NA Dr. Brand Males, Medical Director Dr. Algis Liming is immediately available during today's Pulmonary Rehab session for Denaja Verhoeven [place] on 06/25/2014  at 1330 class time.   Marland Kitchen  Marland Kitchen

## 2014-06-27 ENCOUNTER — Encounter (HOSPITAL_COMMUNITY): Payer: BLUE CROSS/BLUE SHIELD

## 2014-06-27 ENCOUNTER — Encounter (HOSPITAL_COMMUNITY)
Admission: RE | Admit: 2014-06-27 | Discharge: 2014-06-27 | Disposition: A | Discharge status: Home / Self Care | Payer: BLUE CROSS/BLUE SHIELD | Source: Ambulatory Visit | Attending: Internal Medicine | Admitting: Internal Medicine

## 2014-06-27 DIAGNOSIS — Z5189 Encounter for other specified aftercare: Secondary | ICD-10-CM | POA: Diagnosis not present

## 2014-06-27 NOTE — Progress Notes (Signed)
Today, [name] exercised at [facility]. Service time was from 1330 to 1530.  The patient exercised by performing aerobic, strengthening, and stretching exercises. Oxygen saturation, heart rate, blood pressure, rate of perceived exertion, and shortness of breath were all monitored before, during, and after exercise. [name] presented with no problems at today's exercise session. Patient attended warning signs and symptoms class.  The patient did not have an increase in workload intensity during today's exercise session.  Pre-exercise vitals: . Weight kg: 57.1 . Liters of O2: 3 . SpO2: 95 . HR: 104 . BP: 128/60 . CBG: NA  Exercise vitals: . Highest heartrate:  121 . Lowest oxygen saturation: 91 . Highest blood pressure: 154/68 . Liters of 02: 4  Post-exercise vitals: . SpO2: 99 . HR: 103 . BP: 102/60 . Liters of O2: 4 . CBG: NA Dr. Brand Males, Medical Director Dr. Algis Liming is immediately available during today's Pulmonary Rehab session for Deborah [name] on 06/27/2014  at 1330 class time.   Marland Kitchen  Marland Kitchen

## 2014-07-01 ENCOUNTER — Encounter: Payer: Self-pay | Admitting: Internal Medicine

## 2014-07-02 ENCOUNTER — Encounter (HOSPITAL_COMMUNITY)
Admission: RE | Admit: 2014-07-02 | Discharge: 2014-07-02 | Disposition: A | Discharge status: Home / Self Care | Payer: BLUE CROSS/BLUE SHIELD | Source: Ambulatory Visit | Attending: Internal Medicine | Admitting: Internal Medicine

## 2014-07-02 DIAGNOSIS — Z5189 Encounter for other specified aftercare: Secondary | ICD-10-CM | POA: Diagnosis not present

## 2014-07-02 NOTE — Progress Notes (Signed)
Today, Deborah [name] exercised at [facility]. Service time was from 1330 to 1535.  The patient exercised by performing aerobic, strengthening, and stretching exercises. Oxygen saturation, heart rate, blood pressure, rate of perceived exertion, and shortness of breath were all monitored before, during, and after exercise. Deborah [name] presented with no problems at today's exercise session. Deborah [name] also attended an education session on anatomy and physiology of the respiratory system and intimacy.  The patient did not have an increase in workload intensity during today's exercise session.  Pre-exercise vitals: . Weight kg: 56.8 . Liters of O2: 3L . SpO2: 3L . HR: 97 . BP: 105 . CBG: na  Exercise vitals: . Highest heartrate:  115 . Lowest oxygen saturation: 91 . Highest blood pressure: 144/60 . Liters of 02: 4L  Post-exercise vitals: . SpO2: 100 . HR: 106 . BP: 120/60 . Liters of O2: 4L . CBG: na  Dr. Brand Males, Medical Director Dr. Algis Liming is immediately available during today's Pulmonary Rehab session for Deborah [name] on 07/02/2014 at 1330 class time.

## 2014-07-03 ENCOUNTER — Other Ambulatory Visit: Payer: Self-pay | Admitting: Obstetrics and Gynecology

## 2014-07-04 ENCOUNTER — Encounter (HOSPITAL_COMMUNITY)
Admission: RE | Admit: 2014-07-04 | Discharge: 2014-07-04 | Disposition: A | Discharge status: Home / Self Care | Payer: BLUE CROSS/BLUE SHIELD | Source: Ambulatory Visit | Attending: Internal Medicine | Admitting: Internal Medicine

## 2014-07-04 DIAGNOSIS — Z5189 Encounter for other specified aftercare: Secondary | ICD-10-CM | POA: Diagnosis not present

## 2014-07-04 LAB — CYTOLOGY - PAP

## 2014-07-04 NOTE — Progress Notes (Signed)
Today, Deborah [name] exercised at [facility]. Service time was from 1330 to 1530.  The patient exercised by performing aerobic, strengthening, and stretching exercises. Oxygen saturation, heart rate, blood pressure, rate of perceived exertion, and shortness of breath were all monitored before, during, and after exercise. [name] presented with no problems at today's exercise session. Patient attended the Purse-lip and Diaphragmatic Breathing class today.  The patient did  have an increase in workload intensity during today's exercise session.  Pre-exercise vitals: . Weight kg: 57.3 . Liters of O2: 3 . SpO2: 94 . HR: 99 . BP: 116/52 . CBG: na  Exercise vitals: . Highest heartrate:  124 . Lowest oxygen saturation: 89 . Highest blood pressure: 148/76 . Liters of 02: 4  Post-exercise vitals: . SpO2: 99 . HR: 105 . BP: 124/72 . Liters of O2: 4 . CBG: na Dr. Brand Males, Medical Director Dr. Wendee Beavers is immediately available during today's Pulmonary Rehab session for Deborah [name] on 07/04/2014  at 1330 class time.  Marland Kitchen

## 2014-07-09 ENCOUNTER — Encounter (HOSPITAL_COMMUNITY)
Admission: RE | Admit: 2014-07-09 | Discharge: 2014-07-09 | Disposition: A | Discharge status: Home / Self Care | Payer: BLUE CROSS/BLUE SHIELD | Source: Ambulatory Visit | Attending: Internal Medicine | Admitting: Internal Medicine

## 2014-07-09 DIAGNOSIS — Z5189 Encounter for other specified aftercare: Secondary | ICD-10-CM | POA: Diagnosis not present

## 2014-07-09 NOTE — Progress Notes (Signed)
Deborah [name] completed a Six-Minute Walk Test on 07/09/14 . Deborah [name] walked 785 feet with 0 breaks.  The patient's lowest oxygen saturation was 88 %, highest heart rate was 140 bpm , and highest blood pressure was 150/68. The patient was on 4 liters of oxygen with a nasal cannula. Patient stated that nothing hindered their walk test.

## 2014-07-11 ENCOUNTER — Encounter (HOSPITAL_COMMUNITY): Payer: BLUE CROSS/BLUE SHIELD

## 2014-07-15 NOTE — Progress Notes (Signed)
Discharge Note from Pulmonary Rehab  Deborah [name] has graduated from the pulmonary rehab program as of 07/04/2014.  She definitely improved in strength, stamina, and endurance.  She increased her walk test distance by 43%, from 445 ft. to 785 ft.  She even gain 3 kg while she was here which we encouraged.  She has steadily lost weight over the last few years.  Her quality of life improved as well.  She did meet her goals which were to increase her strength, stamina, and endurance, but she still is not riding her horse which was one of her goals.  She will continue exercising in the maintenance pulmonary rehab which meets on Monday, Wednesday, and Friday at 1:30pm.

## 2014-07-16 ENCOUNTER — Encounter (HOSPITAL_COMMUNITY): Payer: BLUE CROSS/BLUE SHIELD

## 2014-07-26 ENCOUNTER — Encounter (HOSPITAL_COMMUNITY)
Admission: RE | Admit: 2014-07-26 | Discharge: 2014-07-26 | Disposition: A | Discharge status: Home / Self Care | Payer: Self-pay | Source: Ambulatory Visit | Attending: Internal Medicine | Admitting: Internal Medicine

## 2014-07-26 DIAGNOSIS — J9610 Chronic respiratory failure, unspecified whether with hypoxia or hypercapnia: Secondary | ICD-10-CM | POA: Insufficient documentation

## 2014-07-26 DIAGNOSIS — J479 Bronchiectasis, uncomplicated: Secondary | ICD-10-CM | POA: Insufficient documentation

## 2014-07-26 DIAGNOSIS — Z5189 Encounter for other specified aftercare: Secondary | ICD-10-CM | POA: Insufficient documentation

## 2014-07-29 ENCOUNTER — Encounter (HOSPITAL_COMMUNITY)
Admission: RE | Admit: 2014-07-29 | Discharge: 2014-07-29 | Disposition: A | Discharge status: Home / Self Care | Payer: Self-pay | Source: Ambulatory Visit | Attending: Pulmonary Disease | Admitting: Pulmonary Disease

## 2014-07-31 ENCOUNTER — Encounter (HOSPITAL_COMMUNITY)
Admission: RE | Admit: 2014-07-31 | Discharge: 2014-07-31 | Disposition: A | Discharge status: Home / Self Care | Payer: Self-pay | Source: Ambulatory Visit | Attending: Pulmonary Disease | Admitting: Pulmonary Disease

## 2014-08-02 ENCOUNTER — Encounter (HOSPITAL_COMMUNITY)
Admission: RE | Admit: 2014-08-02 | Discharge: 2014-08-02 | Disposition: A | Discharge status: Home / Self Care | Payer: Self-pay | Source: Ambulatory Visit | Attending: Pulmonary Disease | Admitting: Pulmonary Disease

## 2014-08-05 ENCOUNTER — Encounter (HOSPITAL_COMMUNITY)
Admission: RE | Admit: 2014-08-05 | Discharge: 2014-08-05 | Disposition: A | Discharge status: Home / Self Care | Payer: Self-pay | Source: Ambulatory Visit | Attending: Pulmonary Disease | Admitting: Pulmonary Disease

## 2014-08-07 ENCOUNTER — Encounter (HOSPITAL_COMMUNITY)
Admission: RE | Admit: 2014-08-07 | Discharge: 2014-08-07 | Disposition: A | Discharge status: Home / Self Care | Payer: Self-pay | Source: Ambulatory Visit | Attending: Pulmonary Disease | Admitting: Pulmonary Disease

## 2014-08-09 ENCOUNTER — Encounter (HOSPITAL_COMMUNITY)
Admission: RE | Admit: 2014-08-09 | Discharge: 2014-08-09 | Disposition: A | Discharge status: Home / Self Care | Payer: Self-pay | Source: Ambulatory Visit | Attending: Pulmonary Disease | Admitting: Pulmonary Disease

## 2014-08-12 ENCOUNTER — Encounter (HOSPITAL_COMMUNITY)
Admission: RE | Admit: 2014-08-12 | Discharge: 2014-08-12 | Disposition: A | Discharge status: Home / Self Care | Payer: Self-pay | Source: Ambulatory Visit | Attending: Pulmonary Disease | Admitting: Pulmonary Disease

## 2014-08-14 ENCOUNTER — Encounter (HOSPITAL_COMMUNITY): Payer: Self-pay

## 2014-08-16 ENCOUNTER — Encounter (HOSPITAL_COMMUNITY)
Admission: RE | Admit: 2014-08-16 | Discharge: 2014-08-16 | Disposition: A | Discharge status: Home / Self Care | Payer: Self-pay | Source: Ambulatory Visit | Attending: Pulmonary Disease | Admitting: Pulmonary Disease

## 2014-08-19 ENCOUNTER — Encounter (HOSPITAL_COMMUNITY)
Admission: RE | Admit: 2014-08-19 | Discharge: 2014-08-19 | Disposition: A | Discharge status: Home / Self Care | Payer: Self-pay | Source: Ambulatory Visit | Attending: Pulmonary Disease | Admitting: Pulmonary Disease

## 2014-08-21 ENCOUNTER — Encounter (HOSPITAL_COMMUNITY): Payer: Self-pay

## 2014-08-23 ENCOUNTER — Encounter (HOSPITAL_COMMUNITY)
Admission: RE | Admit: 2014-08-23 | Discharge: 2014-08-23 | Disposition: A | Discharge status: Home / Self Care | Payer: BC Managed Care – PPO | Source: Ambulatory Visit | Attending: Internal Medicine | Admitting: Internal Medicine

## 2014-08-23 DIAGNOSIS — J479 Bronchiectasis, uncomplicated: Secondary | ICD-10-CM | POA: Insufficient documentation

## 2014-08-23 DIAGNOSIS — J9610 Chronic respiratory failure, unspecified whether with hypoxia or hypercapnia: Secondary | ICD-10-CM | POA: Insufficient documentation

## 2014-08-23 DIAGNOSIS — Z5189 Encounter for other specified aftercare: Secondary | ICD-10-CM | POA: Insufficient documentation

## 2014-08-26 ENCOUNTER — Encounter (HOSPITAL_COMMUNITY): Payer: BLUE CROSS/BLUE SHIELD

## 2014-08-28 ENCOUNTER — Encounter (HOSPITAL_COMMUNITY)
Admission: RE | Admit: 2014-08-28 | Discharge: 2014-08-28 | Disposition: A | Discharge status: Home / Self Care | Payer: Self-pay | Source: Ambulatory Visit | Attending: Pulmonary Disease | Admitting: Pulmonary Disease

## 2014-08-30 ENCOUNTER — Encounter (HOSPITAL_COMMUNITY)
Admission: RE | Admit: 2014-08-30 | Discharge: 2014-08-30 | Disposition: A | Discharge status: Home / Self Care | Payer: Self-pay | Source: Ambulatory Visit | Attending: Pulmonary Disease | Admitting: Pulmonary Disease

## 2014-09-02 ENCOUNTER — Encounter (HOSPITAL_COMMUNITY)
Admission: RE | Admit: 2014-09-02 | Discharge: 2014-09-02 | Disposition: A | Discharge status: Home / Self Care | Payer: Self-pay | Source: Ambulatory Visit | Attending: Pulmonary Disease | Admitting: Pulmonary Disease

## 2014-09-04 ENCOUNTER — Encounter: Payer: Self-pay | Admitting: Internal Medicine

## 2014-09-04 ENCOUNTER — Encounter (HOSPITAL_COMMUNITY)
Admission: RE | Admit: 2014-09-04 | Discharge: 2014-09-04 | Disposition: A | Discharge status: Home / Self Care | Payer: Self-pay | Source: Ambulatory Visit | Attending: Pulmonary Disease | Admitting: Pulmonary Disease

## 2014-09-06 ENCOUNTER — Encounter (HOSPITAL_COMMUNITY)
Admission: RE | Admit: 2014-09-06 | Discharge: 2014-09-06 | Disposition: A | Discharge status: Home / Self Care | Payer: Self-pay | Source: Ambulatory Visit | Attending: Pulmonary Disease | Admitting: Pulmonary Disease

## 2014-09-09 ENCOUNTER — Encounter (HOSPITAL_COMMUNITY)
Admission: RE | Admit: 2014-09-09 | Discharge: 2014-09-09 | Disposition: A | Discharge status: Home / Self Care | Payer: Self-pay | Source: Ambulatory Visit | Attending: Pulmonary Disease | Admitting: Pulmonary Disease

## 2014-09-11 ENCOUNTER — Encounter (HOSPITAL_COMMUNITY)
Admission: RE | Admit: 2014-09-11 | Discharge: 2014-09-11 | Disposition: A | Discharge status: Home / Self Care | Payer: Self-pay | Source: Ambulatory Visit | Attending: Pulmonary Disease | Admitting: Pulmonary Disease

## 2014-09-13 ENCOUNTER — Encounter (HOSPITAL_COMMUNITY)
Admission: RE | Admit: 2014-09-13 | Discharge: 2014-09-13 | Disposition: A | Discharge status: Home / Self Care | Payer: Self-pay | Source: Ambulatory Visit | Attending: Pulmonary Disease | Admitting: Pulmonary Disease

## 2014-09-16 ENCOUNTER — Encounter (HOSPITAL_COMMUNITY)
Admission: RE | Admit: 2014-09-16 | Discharge: 2014-09-16 | Disposition: A | Discharge status: Home / Self Care | Payer: Self-pay | Source: Ambulatory Visit | Attending: Pulmonary Disease | Admitting: Pulmonary Disease

## 2014-09-16 ENCOUNTER — Encounter (HOSPITAL_COMMUNITY): Payer: BC Managed Care – PPO

## 2014-09-18 ENCOUNTER — Encounter (HOSPITAL_COMMUNITY)
Admission: RE | Admit: 2014-09-18 | Discharge: 2014-09-18 | Disposition: A | Discharge status: Home / Self Care | Payer: Self-pay | Source: Ambulatory Visit | Attending: Pulmonary Disease | Admitting: Pulmonary Disease

## 2014-09-20 ENCOUNTER — Encounter (HOSPITAL_COMMUNITY)
Admission: RE | Admit: 2014-09-20 | Discharge: 2014-09-20 | Disposition: A | Discharge status: Home / Self Care | Payer: Self-pay | Source: Ambulatory Visit | Attending: Pulmonary Disease | Admitting: Pulmonary Disease

## 2014-09-23 ENCOUNTER — Encounter (HOSPITAL_COMMUNITY)
Admission: RE | Admit: 2014-09-23 | Discharge: 2014-09-23 | Disposition: A | Discharge status: Home / Self Care | Payer: Self-pay | Source: Ambulatory Visit | Attending: Internal Medicine | Admitting: Internal Medicine

## 2014-09-23 DIAGNOSIS — Z5189 Encounter for other specified aftercare: Secondary | ICD-10-CM | POA: Insufficient documentation

## 2014-09-23 DIAGNOSIS — J479 Bronchiectasis, uncomplicated: Secondary | ICD-10-CM | POA: Insufficient documentation

## 2014-09-23 DIAGNOSIS — J9610 Chronic respiratory failure, unspecified whether with hypoxia or hypercapnia: Secondary | ICD-10-CM | POA: Insufficient documentation

## 2014-09-25 ENCOUNTER — Encounter (HOSPITAL_COMMUNITY)
Admission: RE | Admit: 2014-09-25 | Discharge: 2014-09-25 | Disposition: A | Discharge status: Home / Self Care | Payer: Self-pay | Source: Ambulatory Visit | Attending: Pulmonary Disease | Admitting: Pulmonary Disease

## 2014-09-27 ENCOUNTER — Encounter (HOSPITAL_COMMUNITY)
Admission: RE | Admit: 2014-09-27 | Discharge: 2014-09-27 | Disposition: A | Discharge status: Home / Self Care | Payer: Self-pay | Source: Ambulatory Visit | Attending: Pulmonary Disease | Admitting: Pulmonary Disease

## 2014-09-30 ENCOUNTER — Encounter (HOSPITAL_COMMUNITY)
Admission: RE | Admit: 2014-09-30 | Discharge: 2014-09-30 | Disposition: A | Discharge status: Home / Self Care | Payer: Self-pay | Source: Ambulatory Visit | Attending: Pulmonary Disease | Admitting: Pulmonary Disease

## 2014-10-02 ENCOUNTER — Encounter (HOSPITAL_COMMUNITY)
Admission: RE | Admit: 2014-10-02 | Discharge: 2014-10-02 | Disposition: A | Discharge status: Home / Self Care | Payer: Self-pay | Source: Ambulatory Visit | Attending: Pulmonary Disease | Admitting: Pulmonary Disease

## 2014-10-04 ENCOUNTER — Encounter (HOSPITAL_COMMUNITY): Payer: Self-pay

## 2014-10-04 ENCOUNTER — Encounter (HOSPITAL_COMMUNITY): Admission: RE | Admit: 2014-10-04 | Payer: Self-pay | Source: Ambulatory Visit

## 2014-10-04 ENCOUNTER — Ambulatory Visit (HOSPITAL_COMMUNITY)
Admission: RE | Admit: 2014-10-04 | Discharge: 2014-10-04 | Disposition: A | Discharge status: Home / Self Care | Payer: BLUE CROSS/BLUE SHIELD | Source: Ambulatory Visit | Attending: Internal Medicine | Admitting: Internal Medicine

## 2014-10-04 DIAGNOSIS — M810 Age-related osteoporosis without current pathological fracture: Secondary | ICD-10-CM | POA: Insufficient documentation

## 2014-10-04 MED ORDER — DENOSUMAB 60 MG/ML ~~LOC~~ SOLN
60.0000 mg | Freq: Once | SUBCUTANEOUS | Status: AC
  Administered 2014-10-04: 60 mg via SUBCUTANEOUS
  Filled 2014-10-04: qty 1

## 2014-10-04 NOTE — Discharge Instructions (Signed)
PROLIA Denosumab injection What is this medicine? DENOSUMAB (den oh sue mab) slows bone breakdown. Prolia is used to treat osteoporosis in women after menopause and in men. Delton See is used to prevent bone fractures and other bone problems caused by cancer bone metastases. Delton See is also used to treat giant cell tumor of the bone. This medicine may be used for other purposes; ask your health care provider or pharmacist if you have questions. COMMON BRAND NAME(S): Prolia, XGEVA What should I tell my health care provider before I take this medicine? They need to know if you have any of these conditions: -dental disease -eczema -infection or history of infections -kidney disease or on dialysis -low blood calcium or vitamin D -malabsorption syndrome -scheduled to have surgery or tooth extraction -taking medicine that contains denosumab -thyroid or parathyroid disease -an unusual reaction to denosumab, other medicines, foods, dyes, or preservatives -pregnant or trying to get pregnant -breast-feeding How should I use this medicine? This medicine is for injection under the skin. It is given by a health care professional in a hospital or clinic setting. If you are getting Prolia, a special MedGuide will be given to you by the pharmacist with each prescription and refill. Be sure to read this information carefully each time. For Prolia, talk to your pediatrician regarding the use of this medicine in children. Special care may be needed. For Delton See, talk to your pediatrician regarding the use of this medicine in children. While this drug may be prescribed for children as young as 13 years for selected conditions, precautions do apply. Overdosage: If you think you've taken too much of this medicine contact a poison control center or emergency room at once. Overdosage: If you think you have taken too much of this medicine contact a poison control center or emergency room at once. NOTE: This medicine is only  for you. Do not share this medicine with others. What if I miss a dose? It is important not to miss your dose. Call your doctor or health care professional if you are unable to keep an appointment. What may interact with this medicine? Do not take this medicine with any of the following medications: -other medicines containing denosumab This medicine may also interact with the following medications: -medicines that suppress the immune system -medicines that treat cancer -steroid medicines like prednisone or cortisone This list may not describe all possible interactions. Give your health care provider a list of all the medicines, herbs, non-prescription drugs, or dietary supplements you use. Also tell them if you smoke, drink alcohol, or use illegal drugs. Some items may interact with your medicine. What should I watch for while using this medicine? Visit your doctor or health care professional for regular checks on your progress. Your doctor or health care professional may order blood tests and other tests to see how you are doing. Call your doctor or health care professional if you get a cold or other infection while receiving this medicine. Do not treat yourself. This medicine may decrease your body's ability to fight infection. You should make sure you get enough calcium and vitamin D while you are taking this medicine, unless your doctor tells you not to. Discuss the foods you eat and the vitamins you take with your health care professional. See your dentist regularly. Brush and floss your teeth as directed. Before you have any dental work done, tell your dentist you are receiving this medicine. Do not become pregnant while taking this medicine or for 5 months after  stopping it. Women should inform their doctor if they wish to become pregnant or think they might be pregnant. There is a potential for serious side effects to an unborn child. Talk to your health care professional or pharmacist for  more information. What side effects may I notice from receiving this medicine? Side effects that you should report to your doctor or health care professional as soon as possible: -allergic reactions like skin rash, itching or hives, swelling of the face, lips, or tongue -breathing problems -chest pain -fast, irregular heartbeat -feeling faint or lightheaded, falls -fever, chills, or any other sign of infection -muscle spasms, tightening, or twitches -numbness or tingling -skin blisters or bumps, or is dry, peels, or red -slow healing or unexplained pain in the mouth or jaw -unusual bleeding or bruising Side effects that usually do not require medical attention (Report these to your doctor or health care professional if they continue or are bothersome.): -muscle pain -stomach upset, gas This list may not describe all possible side effects. Call your doctor for medical advice about side effects. You may report side effects to FDA at 1-800-FDA-1088. Where should I keep my medicine? This medicine is only given in a clinic, doctor's office, or other health care setting and will not be stored at home. NOTE: This sheet is a summary. It may not cover all possible information. If you have questions about this medicine, talk to your doctor, pharmacist, or health care provider.  2015, Elsevier/Gold Standard. (2011-08-09 12:37:47) Osteoporosis Throughout your life, your body breaks down old bone and replaces it with new bone. As you get older, your body does not replace bone as quickly as it breaks it down. By the age of 12 years, most people begin to gradually lose bone because of the imbalance between bone loss and replacement. Some people lose more bone than others. Bone loss beyond a specified normal degree is considered osteoporosis.  Osteoporosis affects the strength and durability of your bones. The inside of the ends of your bones and your flat bones, like the bones of your pelvis, look like  honeycomb, filled with tiny open spaces. As bone loss occurs, your bones become less dense. This means that the open spaces inside your bones become bigger and the walls between these spaces become thinner. This makes your bones weaker. Bones of a person with osteoporosis can become so weak that they can break (fracture) during minor accidents, such as a simple fall. CAUSES  The following factors have been associated with the development of osteoporosis:  Smoking.  Drinking more than 2 alcoholic drinks several days per week.  Long-term use of certain medicines:  Corticosteroids.  Chemotherapy medicines.  Thyroid medicines.  Antiepileptic medicines.  Gonadal hormone suppression medicine.  Immunosuppression medicine.  Being underweight.  Lack of physical activity.  Lack of exposure to the sun. This can lead to vitamin D deficiency.  Certain medical conditions:  Certain inflammatory bowel diseases, such as Crohn disease and ulcerative colitis.  Diabetes.  Hyperthyroidism.  Hyperparathyroidism. RISK FACTORS Anyone can develop osteoporosis. However, the following factors can increase your risk of developing osteoporosis:  Gender--Women are at higher risk than men.  Age--Being older than 50 years increases your risk.  Ethnicity--White and Asian people have an increased risk.  Weight --Being extremely underweight can increase your risk of osteoporosis.  Family history of osteoporosis--Having a family member who has developed osteoporosis can increase your risk. SYMPTOMS  Usually, people with osteoporosis have no symptoms.  DIAGNOSIS  Signs during  a physical exam that may prompt your caregiver to suspect osteoporosis include:  Decreased height. This is usually caused by the compression of the bones that form your spine (vertebrae) because they have weakened and become fractured.  A curving or rounding of the upper back (kyphosis). To confirm signs of osteoporosis,  your caregiver may request a procedure that uses 2 low-dose X-ray beams with different levels of energy to measure your bone mineral density (dual-energy X-ray absorptiometry [DXA]). Also, your caregiver may check your level of vitamin D. TREATMENT  The goal of osteoporosis treatment is to strengthen bones in order to decrease the risk of bone fractures. There are different types of medicines available to help achieve this goal. Some of these medicines work by slowing the processes of bone loss. Some medicines work by increasing bone density. Treatment also involves making sure that your levels of calcium and vitamin D are adequate. PREVENTION  There are things you can do to help prevent osteoporosis. Adequate intake of calcium and vitamin D can help you achieve optimal bone mineral density. Regular exercise can also help, especially resistance and weight-bearing activities. If you smoke, quitting smoking is an important part of osteoporosis prevention. MAKE SURE YOU:  Understand these instructions.  Will watch your condition.  Will get help right away if you are not doing well or get worse. FOR MORE INFORMATION www.osteo.org and [employer].com.ee Document Released: 11/18/2004 Document Revised: 06/05/2012 Document Reviewed: 01/23/2011 [facility] Patient Information 2015 [place], [place]. This information is not intended to replace advice given to you by your health care provider. Make sure you discuss any questions you have with your health care provider.

## 2014-10-07 ENCOUNTER — Encounter (HOSPITAL_COMMUNITY)
Admission: RE | Admit: 2014-10-07 | Discharge: 2014-10-07 | Disposition: A | Discharge status: Home / Self Care | Payer: Self-pay | Source: Ambulatory Visit | Attending: Pulmonary Disease | Admitting: Pulmonary Disease

## 2014-10-09 ENCOUNTER — Encounter (HOSPITAL_COMMUNITY)
Admission: RE | Admit: 2014-10-09 | Discharge: 2014-10-09 | Disposition: A | Discharge status: Home / Self Care | Payer: Self-pay | Source: Ambulatory Visit | Attending: Pulmonary Disease | Admitting: Pulmonary Disease

## 2014-10-11 ENCOUNTER — Encounter (HOSPITAL_COMMUNITY)
Admission: RE | Admit: 2014-10-11 | Discharge: 2014-10-11 | Disposition: A | Discharge status: Home / Self Care | Payer: Self-pay | Source: Ambulatory Visit | Attending: Pulmonary Disease | Admitting: Pulmonary Disease

## 2014-10-14 ENCOUNTER — Encounter (HOSPITAL_COMMUNITY): Payer: Self-pay

## 2014-10-16 ENCOUNTER — Encounter (HOSPITAL_COMMUNITY): Payer: Self-pay

## 2014-10-18 ENCOUNTER — Encounter (HOSPITAL_COMMUNITY)
Admission: RE | Admit: 2014-10-18 | Discharge: 2014-10-18 | Disposition: A | Discharge status: Home / Self Care | Payer: Self-pay | Source: Ambulatory Visit | Attending: Pulmonary Disease | Admitting: Pulmonary Disease

## 2014-10-21 ENCOUNTER — Encounter (HOSPITAL_COMMUNITY): Admission: RE | Admit: 2014-10-21 | Payer: Self-pay | Source: Ambulatory Visit

## 2014-10-21 ENCOUNTER — Telehealth (HOSPITAL_COMMUNITY): Payer: Self-pay | Admitting: *Deleted

## 2014-10-23 ENCOUNTER — Encounter (HOSPITAL_COMMUNITY): Payer: Self-pay

## 2014-10-25 ENCOUNTER — Encounter (HOSPITAL_COMMUNITY): Payer: BC Managed Care – PPO

## 2014-10-25 DIAGNOSIS — J479 Bronchiectasis, uncomplicated: Secondary | ICD-10-CM | POA: Insufficient documentation

## 2014-10-25 DIAGNOSIS — Z5189 Encounter for other specified aftercare: Secondary | ICD-10-CM | POA: Insufficient documentation

## 2014-10-25 DIAGNOSIS — J9610 Chronic respiratory failure, unspecified whether with hypoxia or hypercapnia: Secondary | ICD-10-CM | POA: Insufficient documentation

## 2014-10-28 ENCOUNTER — Encounter (HOSPITAL_COMMUNITY): Payer: Self-pay

## 2014-10-30 ENCOUNTER — Encounter: Payer: Self-pay | Admitting: Gastroenterology

## 2014-10-30 ENCOUNTER — Encounter (HOSPITAL_COMMUNITY)
Admission: RE | Admit: 2014-10-30 | Discharge: 2014-10-30 | Disposition: A | Discharge status: Home / Self Care | Payer: Self-pay | Source: Ambulatory Visit | Attending: Internal Medicine | Admitting: Internal Medicine

## 2014-11-01 ENCOUNTER — Encounter (HOSPITAL_COMMUNITY)
Admission: RE | Admit: 2014-11-01 | Discharge: 2014-11-01 | Disposition: A | Discharge status: Home / Self Care | Payer: Self-pay | Source: Ambulatory Visit | Attending: Pulmonary Disease | Admitting: Pulmonary Disease

## 2014-11-04 ENCOUNTER — Encounter (HOSPITAL_COMMUNITY)
Admission: RE | Admit: 2014-11-04 | Discharge: 2014-11-04 | Disposition: A | Discharge status: Home / Self Care | Payer: Self-pay | Source: Ambulatory Visit | Attending: Pulmonary Disease | Admitting: Pulmonary Disease

## 2014-11-06 ENCOUNTER — Encounter (HOSPITAL_COMMUNITY)
Admission: RE | Admit: 2014-11-06 | Discharge: 2014-11-06 | Disposition: A | Discharge status: Home / Self Care | Payer: Self-pay | Source: Ambulatory Visit | Attending: Pulmonary Disease | Admitting: Pulmonary Disease

## 2014-11-08 ENCOUNTER — Encounter (HOSPITAL_COMMUNITY)
Admission: RE | Admit: 2014-11-08 | Discharge: 2014-11-08 | Disposition: A | Discharge status: Home / Self Care | Payer: Self-pay | Source: Ambulatory Visit | Attending: Pulmonary Disease | Admitting: Pulmonary Disease

## 2014-11-11 ENCOUNTER — Encounter (HOSPITAL_COMMUNITY)
Admission: RE | Admit: 2014-11-11 | Discharge: 2014-11-11 | Disposition: A | Discharge status: Home / Self Care | Payer: Self-pay | Source: Ambulatory Visit | Attending: Pulmonary Disease | Admitting: Pulmonary Disease

## 2014-11-13 ENCOUNTER — Encounter: Payer: Self-pay | Admitting: Gastroenterology

## 2014-11-13 ENCOUNTER — Encounter (HOSPITAL_COMMUNITY)
Admission: RE | Admit: 2014-11-13 | Discharge: 2014-11-13 | Disposition: A | Discharge status: Home / Self Care | Payer: Self-pay | Source: Ambulatory Visit | Attending: Pulmonary Disease | Admitting: Pulmonary Disease

## 2014-11-15 ENCOUNTER — Encounter (HOSPITAL_COMMUNITY)
Admission: RE | Admit: 2014-11-15 | Discharge: 2014-11-15 | Disposition: A | Discharge status: Home / Self Care | Payer: Self-pay | Source: Ambulatory Visit | Attending: Pulmonary Disease | Admitting: Pulmonary Disease

## 2014-11-18 ENCOUNTER — Encounter (HOSPITAL_COMMUNITY): Payer: Self-pay

## 2014-11-19 ENCOUNTER — Encounter: Payer: Self-pay | Admitting: Gastroenterology

## 2014-11-19 ENCOUNTER — Ambulatory Visit (INDEPENDENT_AMBULATORY_CARE_PROVIDER_SITE_OTHER): Payer: BLUE CROSS/BLUE SHIELD | Admitting: Gastroenterology

## 2014-11-19 ENCOUNTER — Ambulatory Visit: Payer: BC Managed Care – PPO | Admitting: Gastroenterology

## 2014-11-19 VITALS — BP 142/66 | HR 112 | Ht 65.0 in | Wt 125.1 lb

## 2014-11-19 DIAGNOSIS — Z86010 Personal history of colonic polyps: Secondary | ICD-10-CM | POA: Diagnosis not present

## 2014-11-19 DIAGNOSIS — R197 Diarrhea, unspecified: Secondary | ICD-10-CM | POA: Insufficient documentation

## 2014-11-19 NOTE — Patient Instructions (Addendum)
Please follow up with Dr. Carlean Purl on 01/21/15 at 3:30pm to discuss screening Colonoscopy.

## 2014-11-19 NOTE — Progress Notes (Signed)
11/19/2014 Deborah [name] [id number] 05-Feb-1951   HISTORY OF PRESENT ILLNESS:  This is a pleasant 64 year old female who is well-known to Dr. Leroy Kennedy.  Her last colonoscopy was in May 2011 at which time she was found to have a 5 mm polyp in the descending colon that was removed. She does have a prior history of tubulovillous adenoma which was removed in November 2007. It was recommended that she have a repeat colonoscopy in 5 years from the last.   She presents to our office today at the request of her PCP, Dr. Joylene Draft, with complaints of diarrhea. She was placed on CellCept for her severe restrictive lung disease in April of this year. The dose was escalated over time. For 3 weeks recently she had watery diarrhea 1-2 times daily. Even though it was not much in frequency it was preventing her from going to her pulmonary rehabilitation. She sees pulmonologist at [facility]. She is also now on continuous oxygen for almost the past last year and a half. They allowed her to discontinue the CellCept for 2 weeks and the diarrhea resolved within about 3 or 4 days after discontinuing the medication. She was off of it for about 3 weeks, but now has been back on it for 5 days with no recurrence of her diarrhea at this point.  She says that when she is not having diarrhea she actually tends more towards constipation and has been using a fiber supplement daily, which she thinks may be helping.   Past Medical History  Diagnosis Date  . Hyperlipidemia   . Osteopenia   . C. difficile colitis   . Raynaud's disease   . Restrictive lung disease   . Hyperactive airway disease   . Lung cancer carcinoid tumor  2004  . Hypertension   . CHF (congestive heart failure)     right sided heart failure from pulmonary hypertension  . Pneumonia   . Hearing loss     right  . Shingles   . Cataract   . Anemia   . Colon polyps   . COPD (chronic obstructive pulmonary disease)   . CAD (coronary artery disease)   .  Gallstones    Past Surgical History  Procedure Laterality Date  . Broncho pleural fistula  2004  . Pulmonary carcinoid  2004  . Thoracotomies  2004  . Video bronchoscopy  12/07/2011    Procedure: VIDEO BRONCHOSCOPY WITHOUT FLUORO;  Surgeon: Kathee Delton, MD;  Location: [facility];  Service: Cardiopulmonary;  Laterality: Bilateral;    reports that she has never smoked. She has never used smokeless tobacco. She reports that she does not drink alcohol or use illicit drugs. family history is not on file. She was adopted. Allergies  Allergen Reactions  . Megace Es [Megestrol Acetate]   . Megestrol Acetate     REACTION: rash  . Lodine [Etodolac] Rash    REACTION: rash      Outpatient Encounter Prescriptions as of 11/19/2014  Medication Sig  . albuterol (PROVENTIL HFA;VENTOLIN HFA) 108 (90 BASE) MCG/ACT inhaler Inhale 2 puffs into the lungs every 6 (six) hours as needed. For shortness of breath.  . calcium-vitamin D (OSCAL WITH D) 500-200 MG-UNIT per tablet Take 1 tablet by mouth daily.  . cholestyramine light (PREVALITE) 4 GM/DOSE powder Take 1-2 scoopfulls at bedtime as needed  . denosumab (PROLIA) 60 MG/ML SOLN injection Inject 60 mg into the skin every 6 (six) months. Administer in upper arm, thigh, or abdomen  .  DULoxetine (CYMBALTA) 60 MG capsule Take 60 mg by mouth every evening.   . furosemide (LASIX) 40 MG tablet Take by mouth.  . gabapentin (NEURONTIN) 400 MG capsule Take 400 mg by mouth 4 (four) times daily.  Marland Kitchen guaiFENesin (MUCINEX) 600 MG 12 hr tablet Take 600 mg by mouth daily.   Marland Kitchen lidocaine (LIDODERM) 5 % Place 1 patch onto the skin daily. Remove & Discard patch within 12 hours or as directed by MD  . mometasone-formoterol (DULERA) 100-5 MCG/ACT AERO Inhale 2 puffs into the lungs 2 (two) times daily.  . Multiple Vitamin (MULTIVITAMIN WITH MINERALS) TABS Take 1 tablet by mouth daily.  . mycophenolate (CELLCEPT) 500 MG tablet Take 500 mg by mouth as directed.   Marland Kitchen oxycodone  (OXY-IR) 5 MG capsule Take 5 mg by mouth every 6 (six) hours as needed.  . pantoprazole (PROTONIX) 40 MG tablet Take by mouth.  Vladimir Faster Glycol-Propyl Glycol 0.4-0.3 % SOLN Apply to eye.  . potassium chloride SA (K-DUR,KLOR-CON) 20 MEQ tablet Take by mouth.  . predniSONE (DELTASONE) 5 MG tablet Take 5 mg by mouth daily with breakfast.  . saccharomyces boulardii (FLORASTOR) 250 MG capsule Take 1 capsule (250 mg total) by mouth 2 (two) times daily.  . Treprostinil (TYVASO) 0.6 MG/ML SOLN Inhale into the lungs.  . Vitamin D, Ergocalciferol, (DRISDOL) 50000 UNITS CAPS Take 50,000 Units by mouth every 7 (seven) days.   . [DISCONTINUED] mycophenolate (CELLCEPT) 500 MG tablet Take 1,500 mg by mouth at bedtime. 1018m am/ 15067mpm   No facility-administered encounter medications on file as of 11/19/2014.     REVIEW OF SYSTEMS  : All other systems reviewed and negative except where noted in the History of Present Illness.   PHYSICAL EXAM: BP 142/66 mmHg  Pulse 112  Ht _0  (1.651 m)  Wt 125 lb 2 oz (56.756 kg)  BMI 20.82 kg/m2 General: Well developed white female in no acute distress Head: Normocephalic and atraumatic Eyes:  Sclerae anicteric, conjunctiva pink. Ears: Normal auditory acuity Lungs: Decreased breath sounds noted. Heart: Tachy.  No murmurs noted. Abdomen: Soft, non-distended.  Normal bowel sounds.  Non-tender. Musculoskeletal: Symmetrical with no gross deformities  Skin: No lesions on visible extremities Extremities: No edema  Neurological: Alert oriented x 4, grossly non-focal Psychological:  Alert and cooperative. Normal mood and affect  ASSESSMENT AND PLAN: -Diarrhea:  Resolved with discontinuation of CellCept.  She is now back on the medication, however, has not yet had recurrence of the diarrhea.   -Personal history of colon polyps:  Patient is on continuous O2 and now on cellcept for severe restrictive lung disease.  She is at much higher risk for screening  procedures at this point.  Discussed with Dr. GeCarlean Purlnd with patient; will hold off with surveillance colonoscopy at this time.  Will follow-up with Dr. GeCarlean Purln 6-8 weeks to re-discuss. -Progressive restrictive lung disease with PAH and CREST syndrome:  Follows with Pulmonary at [facility] on continuous O2 and immunosuppressive therapy.   CC:  PeCrist InfanteMD

## 2014-11-20 ENCOUNTER — Encounter (HOSPITAL_COMMUNITY)
Admission: RE | Admit: 2014-11-20 | Discharge: 2014-11-20 | Disposition: A | Discharge status: Home / Self Care | Payer: Self-pay | Source: Ambulatory Visit | Attending: Pulmonary Disease | Admitting: Pulmonary Disease

## 2014-11-22 ENCOUNTER — Encounter (HOSPITAL_COMMUNITY)
Admission: RE | Admit: 2014-11-22 | Discharge: 2014-11-22 | Disposition: A | Discharge status: Home / Self Care | Payer: Self-pay | Source: Ambulatory Visit | Attending: Pulmonary Disease | Admitting: Pulmonary Disease

## 2014-11-24 ENCOUNTER — Encounter: Payer: Self-pay | Admitting: Internal Medicine

## 2014-11-24 NOTE — Telephone Encounter (Signed)
  This encounter was created in error - please disregard.

## 2014-11-24 NOTE — Progress Notes (Signed)
Agree with Ms. Alphia Kava management.  Gatha Mayer, MD, [facility]

## 2014-11-25 ENCOUNTER — Encounter (HOSPITAL_COMMUNITY)
Admission: RE | Admit: 2014-11-25 | Discharge: 2014-11-25 | Disposition: A | Discharge status: Home / Self Care | Payer: Self-pay | Source: Ambulatory Visit | Attending: Internal Medicine | Admitting: Internal Medicine

## 2014-11-25 DIAGNOSIS — Z5189 Encounter for other specified aftercare: Secondary | ICD-10-CM | POA: Insufficient documentation

## 2014-11-25 DIAGNOSIS — J9610 Chronic respiratory failure, unspecified whether with hypoxia or hypercapnia: Secondary | ICD-10-CM | POA: Insufficient documentation

## 2014-11-25 DIAGNOSIS — J479 Bronchiectasis, uncomplicated: Secondary | ICD-10-CM | POA: Insufficient documentation

## 2014-11-27 ENCOUNTER — Encounter (HOSPITAL_COMMUNITY)
Admission: RE | Admit: 2014-11-27 | Discharge: 2014-11-27 | Disposition: A | Discharge status: Home / Self Care | Payer: Self-pay | Source: Ambulatory Visit | Attending: Pulmonary Disease | Admitting: Pulmonary Disease

## 2014-11-29 ENCOUNTER — Encounter (HOSPITAL_COMMUNITY)
Admission: RE | Admit: 2014-11-29 | Discharge: 2014-11-29 | Disposition: A | Discharge status: Home / Self Care | Payer: Self-pay | Source: Ambulatory Visit | Attending: Pulmonary Disease | Admitting: Pulmonary Disease

## 2014-12-02 ENCOUNTER — Encounter (HOSPITAL_COMMUNITY)
Admission: RE | Admit: 2014-12-02 | Discharge: 2014-12-02 | Disposition: A | Discharge status: Home / Self Care | Payer: Self-pay | Source: Ambulatory Visit | Attending: Pulmonary Disease | Admitting: Pulmonary Disease

## 2014-12-04 ENCOUNTER — Encounter (HOSPITAL_COMMUNITY)
Admission: RE | Admit: 2014-12-04 | Discharge: 2014-12-04 | Disposition: A | Discharge status: Home / Self Care | Payer: Self-pay | Source: Ambulatory Visit | Attending: Pulmonary Disease | Admitting: Pulmonary Disease

## 2014-12-06 ENCOUNTER — Encounter (HOSPITAL_COMMUNITY)
Admission: RE | Admit: 2014-12-06 | Discharge: 2014-12-06 | Disposition: A | Discharge status: Home / Self Care | Payer: Self-pay | Source: Ambulatory Visit | Attending: Pulmonary Disease | Admitting: Pulmonary Disease

## 2014-12-09 ENCOUNTER — Encounter (HOSPITAL_COMMUNITY)
Admission: RE | Admit: 2014-12-09 | Discharge: 2014-12-09 | Disposition: A | Discharge status: Home / Self Care | Payer: Self-pay | Source: Ambulatory Visit | Attending: Pulmonary Disease | Admitting: Pulmonary Disease

## 2014-12-11 ENCOUNTER — Encounter (HOSPITAL_COMMUNITY)
Admission: RE | Admit: 2014-12-11 | Discharge: 2014-12-11 | Disposition: A | Discharge status: Home / Self Care | Payer: Self-pay | Source: Ambulatory Visit | Attending: Pulmonary Disease | Admitting: Pulmonary Disease

## 2014-12-13 ENCOUNTER — Encounter (HOSPITAL_COMMUNITY)
Admission: RE | Admit: 2014-12-13 | Discharge: 2014-12-13 | Disposition: A | Discharge status: Home / Self Care | Payer: Self-pay | Source: Ambulatory Visit | Attending: Pulmonary Disease | Admitting: Pulmonary Disease

## 2014-12-16 ENCOUNTER — Encounter (HOSPITAL_COMMUNITY)
Admission: RE | Admit: 2014-12-16 | Discharge: 2014-12-16 | Disposition: A | Discharge status: Home / Self Care | Payer: Self-pay | Source: Ambulatory Visit | Attending: Pulmonary Disease | Admitting: Pulmonary Disease

## 2014-12-18 ENCOUNTER — Encounter (HOSPITAL_COMMUNITY)
Admission: RE | Admit: 2014-12-18 | Discharge: 2014-12-18 | Disposition: A | Discharge status: Home / Self Care | Payer: Self-pay | Source: Ambulatory Visit | Attending: Pulmonary Disease | Admitting: Pulmonary Disease

## 2014-12-20 ENCOUNTER — Encounter (HOSPITAL_COMMUNITY): Payer: Self-pay

## 2014-12-23 ENCOUNTER — Encounter (HOSPITAL_COMMUNITY): Payer: Self-pay

## 2014-12-25 ENCOUNTER — Encounter (HOSPITAL_COMMUNITY)
Admission: RE | Admit: 2014-12-25 | Discharge: 2014-12-25 | Disposition: A | Discharge status: Home / Self Care | Payer: Self-pay | Source: Ambulatory Visit | Attending: Internal Medicine | Admitting: Internal Medicine

## 2014-12-25 DIAGNOSIS — J9610 Chronic respiratory failure, unspecified whether with hypoxia or hypercapnia: Secondary | ICD-10-CM | POA: Insufficient documentation

## 2014-12-25 DIAGNOSIS — J479 Bronchiectasis, uncomplicated: Secondary | ICD-10-CM | POA: Insufficient documentation

## 2014-12-25 DIAGNOSIS — Z5189 Encounter for other specified aftercare: Secondary | ICD-10-CM | POA: Insufficient documentation

## 2014-12-27 ENCOUNTER — Encounter (HOSPITAL_COMMUNITY)
Admission: RE | Admit: 2014-12-27 | Discharge: 2014-12-27 | Disposition: A | Discharge status: Home / Self Care | Payer: Self-pay | Source: Ambulatory Visit | Attending: Pulmonary Disease | Admitting: Pulmonary Disease

## 2014-12-30 ENCOUNTER — Encounter (HOSPITAL_COMMUNITY)
Admission: RE | Admit: 2014-12-30 | Discharge: 2014-12-30 | Disposition: A | Discharge status: Home / Self Care | Payer: Self-pay | Source: Ambulatory Visit | Attending: Pulmonary Disease | Admitting: Pulmonary Disease

## 2015-01-01 ENCOUNTER — Encounter (HOSPITAL_COMMUNITY)
Admission: RE | Admit: 2015-01-01 | Discharge: 2015-01-01 | Disposition: A | Discharge status: Home / Self Care | Payer: Self-pay | Source: Ambulatory Visit | Attending: Pulmonary Disease | Admitting: Pulmonary Disease

## 2015-01-03 ENCOUNTER — Encounter (HOSPITAL_COMMUNITY)
Admission: RE | Admit: 2015-01-03 | Discharge: 2015-01-03 | Disposition: A | Discharge status: Home / Self Care | Payer: Self-pay | Source: Ambulatory Visit | Attending: Pulmonary Disease | Admitting: Pulmonary Disease

## 2015-01-06 ENCOUNTER — Encounter (HOSPITAL_COMMUNITY)
Admission: RE | Admit: 2015-01-06 | Discharge: 2015-01-06 | Disposition: A | Discharge status: Home / Self Care | Payer: Self-pay | Source: Ambulatory Visit | Attending: Pulmonary Disease | Admitting: Pulmonary Disease

## 2015-01-07 ENCOUNTER — Ambulatory Visit: Payer: BC Managed Care – PPO | Admitting: Internal Medicine

## 2015-01-08 ENCOUNTER — Encounter (HOSPITAL_COMMUNITY)
Admission: RE | Admit: 2015-01-08 | Discharge: 2015-01-08 | Disposition: A | Discharge status: Home / Self Care | Payer: Self-pay | Source: Ambulatory Visit | Attending: Pulmonary Disease | Admitting: Pulmonary Disease

## 2015-01-10 ENCOUNTER — Encounter (HOSPITAL_COMMUNITY)
Admission: RE | Admit: 2015-01-10 | Discharge: 2015-01-10 | Disposition: A | Discharge status: Home / Self Care | Payer: Self-pay | Source: Ambulatory Visit | Attending: Pulmonary Disease | Admitting: Pulmonary Disease

## 2015-01-13 ENCOUNTER — Encounter (HOSPITAL_COMMUNITY)
Admission: RE | Admit: 2015-01-13 | Discharge: 2015-01-13 | Disposition: A | Discharge status: Home / Self Care | Payer: Self-pay | Source: Ambulatory Visit | Attending: Pulmonary Disease | Admitting: Pulmonary Disease

## 2015-01-15 ENCOUNTER — Encounter (HOSPITAL_COMMUNITY): Payer: Self-pay

## 2015-01-17 ENCOUNTER — Encounter (HOSPITAL_COMMUNITY): Payer: Self-pay

## 2015-01-20 ENCOUNTER — Encounter (HOSPITAL_COMMUNITY): Payer: Self-pay

## 2015-01-21 ENCOUNTER — Encounter: Payer: Self-pay | Admitting: Internal Medicine

## 2015-01-21 ENCOUNTER — Ambulatory Visit (INDEPENDENT_AMBULATORY_CARE_PROVIDER_SITE_OTHER): Payer: BLUE CROSS/BLUE SHIELD | Admitting: Internal Medicine

## 2015-01-21 VITALS — BP 114/60 | HR 112 | Ht 65.0 in | Wt 123.1 lb

## 2015-01-21 DIAGNOSIS — Z86010 Personal history of colonic polyps: Secondary | ICD-10-CM

## 2015-01-21 DIAGNOSIS — J069 Acute upper respiratory infection, unspecified: Secondary | ICD-10-CM

## 2015-01-21 DIAGNOSIS — K589 Irritable bowel syndrome without diarrhea: Secondary | ICD-10-CM | POA: Diagnosis not present

## 2015-01-21 NOTE — Progress Notes (Signed)
   Subjective:    Patient ID: Deborah [name], female    DOB: Mar 15, 1950, 64 y.o.   MRN: [id number] Cc: hx colon polyps, hx diarrhea ? colonoscopy HPI [place] returns - recently saw [employer] - we deferred decision on a colonoscopy to now. She was having bad diarrhea - held CellCept and better though still with loose stools - usually continent but wears a pad in case. Tends to have swings of no stools and the multiple - has IBS. Says she uses fiber prn and suspects would be better if took regularly  She is on O2 and has pulmonary htn  Having several days of a cough, mildly productive tan/yellow sputum. Husband has had same thing and course is similar to his - he is resolving.  Medications, allergies, past medical history, past surgical history, family history and social history are reviewed and updated in the EMR.  Review of Systems As above    Objective:   Physical Exam BP 114/60 mmHg  Pulse 112  Ht _0  (1.651 m)  Wt 123 lb 2 oz (55.849 kg)  BMI 20.49 kg/m2 On [place] O2 Mildly chronically ill Pharynx clear Neck no lymphadenoathy Lungs w/ decreased breath sound LUL area - coarse bronchovesicular sounds elsewhere L/R and RLL crackles Affect - flat     Assessment & Plan:   Hx of colonic polyps  IBS (irritable bowel syndrome)  Acute upper respiratory infection   I  don't think risk/benfits favor surveillance colonoscopy w/ her co-morbidities of pulmonary hypertension, scleroderma.  It seems that Dr. Corrin Parker shares those concerns re: sedation and resp difficulty.  I explained to her my rationale and that I would check with Dr. Corrin Parker as we could do it at hospital w/ anesthesia assistance but even if airway/resp issues ok I do not think she would tolerate other possible complications well.  I recommended she contact Dr. Corrin Parker  if not better URI by Thursday  Benefiber 1 tbsp/day for IBS  I appreciate the opportunity to care for this patient.  YC:XKGYJE,HUDJ A, MD Cresenciano Lick, MD

## 2015-01-21 NOTE — Patient Instructions (Signed)
   Today you are being given a handout to read and follow on benefiber.    Dr Carlean Purl is to consult with your pulmonary Doctor at [facility]  In regards to doing a colonoscopy.    I appreciate the opportunity to care for you. Silvano Rusk, MD, [facility]

## 2015-01-22 ENCOUNTER — Encounter (HOSPITAL_COMMUNITY): Payer: Self-pay

## 2015-01-24 ENCOUNTER — Encounter (HOSPITAL_COMMUNITY): Payer: BLUE CROSS/BLUE SHIELD

## 2015-01-24 DIAGNOSIS — J9610 Chronic respiratory failure, unspecified whether with hypoxia or hypercapnia: Secondary | ICD-10-CM | POA: Insufficient documentation

## 2015-01-24 DIAGNOSIS — Z5189 Encounter for other specified aftercare: Secondary | ICD-10-CM | POA: Insufficient documentation

## 2015-01-24 DIAGNOSIS — J479 Bronchiectasis, uncomplicated: Secondary | ICD-10-CM | POA: Insufficient documentation

## 2015-01-27 ENCOUNTER — Encounter (HOSPITAL_COMMUNITY): Payer: Self-pay

## 2015-01-29 ENCOUNTER — Encounter (HOSPITAL_COMMUNITY)
Admission: RE | Admit: 2015-01-29 | Discharge: 2015-01-29 | Disposition: A | Discharge status: Home / Self Care | Payer: Self-pay | Source: Ambulatory Visit | Attending: Internal Medicine | Admitting: Internal Medicine

## 2015-01-31 ENCOUNTER — Encounter (HOSPITAL_COMMUNITY)
Admission: RE | Admit: 2015-01-31 | Discharge: 2015-01-31 | Disposition: A | Discharge status: Home / Self Care | Payer: Self-pay | Source: Ambulatory Visit | Attending: Pulmonary Disease | Admitting: Pulmonary Disease

## 2015-02-03 ENCOUNTER — Encounter (HOSPITAL_COMMUNITY)
Admission: RE | Admit: 2015-02-03 | Discharge: 2015-02-03 | Disposition: A | Discharge status: Home / Self Care | Payer: Self-pay | Source: Ambulatory Visit | Attending: Pulmonary Disease | Admitting: Pulmonary Disease

## 2015-02-05 ENCOUNTER — Encounter (HOSPITAL_COMMUNITY): Payer: Self-pay

## 2015-02-07 ENCOUNTER — Encounter (HOSPITAL_COMMUNITY): Payer: Self-pay

## 2015-02-10 ENCOUNTER — Encounter (HOSPITAL_COMMUNITY): Payer: Self-pay

## 2015-02-12 ENCOUNTER — Encounter (HOSPITAL_COMMUNITY): Payer: Self-pay

## 2015-02-14 ENCOUNTER — Encounter (HOSPITAL_COMMUNITY): Payer: Self-pay

## 2015-02-17 ENCOUNTER — Encounter (HOSPITAL_COMMUNITY): Payer: Self-pay

## 2015-02-19 ENCOUNTER — Encounter (HOSPITAL_COMMUNITY)
Admission: RE | Admit: 2015-02-19 | Discharge: 2015-02-19 | Disposition: A | Discharge status: Home / Self Care | Payer: Self-pay | Source: Ambulatory Visit | Attending: Pulmonary Disease | Admitting: Pulmonary Disease

## 2015-02-26 ENCOUNTER — Encounter (HOSPITAL_COMMUNITY)
Admission: RE | Admit: 2015-02-26 | Discharge: 2015-02-26 | Disposition: A | Discharge status: Home / Self Care | Payer: Self-pay | Source: Ambulatory Visit | Attending: Internal Medicine | Admitting: Internal Medicine

## 2015-02-26 DIAGNOSIS — Z5189 Encounter for other specified aftercare: Secondary | ICD-10-CM | POA: Insufficient documentation

## 2015-02-26 DIAGNOSIS — J9610 Chronic respiratory failure, unspecified whether with hypoxia or hypercapnia: Secondary | ICD-10-CM | POA: Insufficient documentation

## 2015-02-26 DIAGNOSIS — J479 Bronchiectasis, uncomplicated: Secondary | ICD-10-CM | POA: Insufficient documentation

## 2015-02-28 ENCOUNTER — Encounter (HOSPITAL_COMMUNITY)
Admission: RE | Admit: 2015-02-28 | Discharge: 2015-02-28 | Disposition: A | Discharge status: Home / Self Care | Payer: Self-pay | Source: Ambulatory Visit | Attending: Pulmonary Disease | Admitting: Pulmonary Disease

## 2015-03-05 ENCOUNTER — Encounter (HOSPITAL_COMMUNITY)
Admission: RE | Admit: 2015-03-05 | Discharge: 2015-03-05 | Disposition: A | Discharge status: Home / Self Care | Payer: Self-pay | Source: Ambulatory Visit | Attending: Pulmonary Disease | Admitting: Pulmonary Disease

## 2015-03-07 ENCOUNTER — Encounter (HOSPITAL_COMMUNITY)
Admission: RE | Admit: 2015-03-07 | Discharge: 2015-03-07 | Disposition: A | Discharge status: Home / Self Care | Payer: Self-pay | Source: Ambulatory Visit | Attending: Cardiovascular Disease | Admitting: Cardiovascular Disease

## 2015-03-10 ENCOUNTER — Encounter (HOSPITAL_COMMUNITY)
Admission: RE | Admit: 2015-03-10 | Discharge: 2015-03-10 | Disposition: A | Discharge status: Home / Self Care | Payer: Self-pay | Source: Ambulatory Visit | Attending: Cardiovascular Disease | Admitting: Cardiovascular Disease

## 2015-03-12 ENCOUNTER — Encounter (HOSPITAL_COMMUNITY): Payer: Self-pay

## 2015-03-14 ENCOUNTER — Encounter (HOSPITAL_COMMUNITY)
Admission: RE | Admit: 2015-03-14 | Discharge: 2015-03-14 | Disposition: A | Discharge status: Home / Self Care | Payer: Self-pay | Source: Ambulatory Visit | Attending: Cardiovascular Disease | Admitting: Cardiovascular Disease

## 2015-03-17 ENCOUNTER — Encounter (HOSPITAL_COMMUNITY)
Admission: RE | Admit: 2015-03-17 | Discharge: 2015-03-17 | Disposition: A | Discharge status: Home / Self Care | Payer: Self-pay | Source: Ambulatory Visit | Attending: Cardiovascular Disease | Admitting: Cardiovascular Disease

## 2015-03-19 ENCOUNTER — Encounter (HOSPITAL_COMMUNITY)
Admission: RE | Admit: 2015-03-19 | Discharge: 2015-03-19 | Disposition: A | Discharge status: Home / Self Care | Payer: Self-pay | Source: Ambulatory Visit | Attending: Cardiovascular Disease | Admitting: Cardiovascular Disease

## 2015-03-21 ENCOUNTER — Encounter (HOSPITAL_COMMUNITY)
Admission: RE | Admit: 2015-03-21 | Discharge: 2015-03-21 | Disposition: A | Discharge status: Home / Self Care | Payer: Self-pay | Source: Ambulatory Visit | Attending: Cardiovascular Disease | Admitting: Cardiovascular Disease

## 2015-03-24 ENCOUNTER — Encounter (HOSPITAL_COMMUNITY)
Admission: RE | Admit: 2015-03-24 | Discharge: 2015-03-24 | Disposition: A | Discharge status: Home / Self Care | Payer: Self-pay | Source: Ambulatory Visit | Attending: Cardiovascular Disease | Admitting: Cardiovascular Disease

## 2015-03-26 ENCOUNTER — Encounter (HOSPITAL_COMMUNITY)
Admission: RE | Admit: 2015-03-26 | Discharge: 2015-03-26 | Disposition: A | Discharge status: Home / Self Care | Payer: Self-pay | Source: Ambulatory Visit | Attending: Internal Medicine | Admitting: Internal Medicine

## 2015-03-26 DIAGNOSIS — J9610 Chronic respiratory failure, unspecified whether with hypoxia or hypercapnia: Secondary | ICD-10-CM | POA: Insufficient documentation

## 2015-03-26 DIAGNOSIS — J479 Bronchiectasis, uncomplicated: Secondary | ICD-10-CM | POA: Insufficient documentation

## 2015-03-26 DIAGNOSIS — Z5189 Encounter for other specified aftercare: Secondary | ICD-10-CM | POA: Insufficient documentation

## 2015-03-28 ENCOUNTER — Encounter (HOSPITAL_COMMUNITY)
Admission: RE | Admit: 2015-03-28 | Discharge: 2015-03-28 | Disposition: A | Discharge status: Home / Self Care | Payer: Self-pay | Source: Ambulatory Visit | Attending: Cardiovascular Disease | Admitting: Cardiovascular Disease

## 2015-03-31 ENCOUNTER — Encounter (HOSPITAL_COMMUNITY)
Admission: RE | Admit: 2015-03-31 | Discharge: 2015-03-31 | Disposition: A | Discharge status: Home / Self Care | Payer: Self-pay | Source: Ambulatory Visit | Attending: Cardiovascular Disease | Admitting: Cardiovascular Disease

## 2015-04-02 ENCOUNTER — Encounter (HOSPITAL_COMMUNITY)
Admission: RE | Admit: 2015-04-02 | Discharge: 2015-04-02 | Disposition: A | Discharge status: Home / Self Care | Payer: Self-pay | Source: Ambulatory Visit | Attending: Cardiovascular Disease | Admitting: Cardiovascular Disease

## 2015-04-04 ENCOUNTER — Encounter (HOSPITAL_COMMUNITY): Payer: Self-pay

## 2015-04-04 ENCOUNTER — Ambulatory Visit (HOSPITAL_COMMUNITY)
Admission: RE | Admit: 2015-04-04 | Discharge: 2015-04-04 | Disposition: A | Discharge status: Home / Self Care | Payer: BLUE CROSS/BLUE SHIELD | Source: Ambulatory Visit | Attending: Internal Medicine | Admitting: Internal Medicine

## 2015-04-04 DIAGNOSIS — M810 Age-related osteoporosis without current pathological fracture: Secondary | ICD-10-CM | POA: Diagnosis present

## 2015-04-04 MED ORDER — DENOSUMAB 60 MG/ML ~~LOC~~ SOLN
60.0000 mg | Freq: Once | SUBCUTANEOUS | Status: AC
  Administered 2015-04-04: 60 mg via SUBCUTANEOUS
  Filled 2015-04-04: qty 1

## 2015-04-04 NOTE — Discharge Instructions (Signed)
IF YOU ARE GOING TO BE 15 OR MINUTES LATE FOR YOUR APPOINTMENT, PLEASE CALL [phone] TO MAKE OTHER ARRANGEMENTS FOR YOUR TREATMENT  IF YOU ARRIVE EARLY FOR YOUR SCHEDULED APPOINTMENT , YOU MAY HAVE TO WAIT UNTIL YOUR SCHEDULED TIME.Denosumab injection What is this medicine? DENOSUMAB (den oh sue mab) slows bone breakdown. Prolia is used to treat osteoporosis in women after menopause and in men. Delton See is used to prevent bone fractures and other bone problems caused by cancer bone metastases. Delton See is also used to treat giant cell tumor of the bone. This medicine may be used for other purposes; ask your health care provider or pharmacist if you have questions. What should I tell my health care provider before I take this medicine? They need to know if you have any of these conditions: -dental disease -eczema -infection or history of infections -kidney disease or on dialysis -low blood calcium or vitamin D -malabsorption syndrome -scheduled to have surgery or tooth extraction -taking medicine that contains denosumab -thyroid or parathyroid disease -an unusual reaction to denosumab, other medicines, foods, dyes, or preservatives -pregnant or trying to get pregnant -breast-feeding How should I use this medicine? This medicine is for injection under the skin. It is given by a health care professional in a hospital or clinic setting. If you are getting Prolia, a special MedGuide will be given to you by the pharmacist with each prescription and refill. Be sure to read this information carefully each time. For Prolia, talk to your pediatrician regarding the use of this medicine in children. Special care may be needed. For Delton See, talk to your pediatrician regarding the use of this medicine in children. While this drug may be prescribed for children as young as 13 years for selected conditions, precautions do apply. Overdosage: If you think you have taken too much of this medicine contact a poison  control center or emergency room at once. NOTE: This medicine is only for you. Do not share this medicine with others. What if I miss a dose? It is important not to miss your dose. Call your doctor or health care professional if you are unable to keep an appointment. What may interact with this medicine? Do not take this medicine with any of the following medications: -other medicines containing denosumab This medicine may also interact with the following medications: -medicines that suppress the immune system -medicines that treat cancer -steroid medicines like prednisone or cortisone This list may not describe all possible interactions. Give your health care provider a list of all the medicines, herbs, non-prescription drugs, or dietary supplements you use. Also tell them if you smoke, drink alcohol, or use illegal drugs. Some items may interact with your medicine. What should I watch for while using this medicine? Visit your doctor or health care professional for regular checks on your progress. Your doctor or health care professional may order blood tests and other tests to see how you are doing. Call your doctor or health care professional if you get a cold or other infection while receiving this medicine. Do not treat yourself. This medicine may decrease your body's ability to fight infection. You should make sure you get enough calcium and vitamin D while you are taking this medicine, unless your doctor tells you not to. Discuss the foods you eat and the vitamins you take with your health care professional. See your dentist regularly. Brush and floss your teeth as directed. Before you have any dental work done, tell your dentist you are receiving this  medicine. Do not become pregnant while taking this medicine or for 5 months after stopping it. Women should inform their doctor if they wish to become pregnant or think they might be pregnant. There is a potential for serious side effects to an  unborn child. Talk to your health care professional or pharmacist for more information. What side effects may I notice from receiving this medicine? Side effects that you should report to your doctor or health care professional as soon as possible: -allergic reactions like skin rash, itching or hives, swelling of the face, lips, or tongue -breathing problems -chest pain -fast, irregular heartbeat -feeling faint or lightheaded, falls -fever, chills, or any other sign of infection -muscle spasms, tightening, or twitches -numbness or tingling -skin blisters or bumps, or is dry, peels, or red -slow healing or unexplained pain in the mouth or jaw -unusual bleeding or bruising Side effects that usually do not require medical attention (Report these to your doctor or health care professional if they continue or are bothersome.): -muscle pain -stomach upset, gas This list may not describe all possible side effects. Call your doctor for medical advice about side effects. You may report side effects to FDA at 1-800-FDA-1088. Where should I keep my medicine? This medicine is only given in a clinic, doctor's office, or other health care setting and will not be stored at home. NOTE: This sheet is a summary. It may not cover all possible information. If you have questions about this medicine, talk to your doctor, pharmacist, or health care provider.    2016, Elsevier/Gold Standard. (2011-08-09 12:37:47)

## 2015-04-07 ENCOUNTER — Encounter (HOSPITAL_COMMUNITY)
Admission: RE | Admit: 2015-04-07 | Discharge: 2015-04-07 | Disposition: A | Discharge status: Home / Self Care | Payer: Self-pay | Source: Ambulatory Visit | Attending: Cardiovascular Disease | Admitting: Cardiovascular Disease

## 2015-04-09 ENCOUNTER — Encounter (HOSPITAL_COMMUNITY)
Admission: RE | Admit: 2015-04-09 | Discharge: 2015-04-09 | Disposition: A | Discharge status: Home / Self Care | Payer: Self-pay | Source: Ambulatory Visit | Attending: Cardiovascular Disease | Admitting: Cardiovascular Disease

## 2015-04-11 ENCOUNTER — Encounter (HOSPITAL_COMMUNITY): Payer: Self-pay

## 2015-04-14 ENCOUNTER — Encounter (HOSPITAL_COMMUNITY)
Admission: RE | Admit: 2015-04-14 | Discharge: 2015-04-14 | Disposition: A | Discharge status: Home / Self Care | Payer: Self-pay | Source: Ambulatory Visit | Attending: Cardiovascular Disease | Admitting: Cardiovascular Disease

## 2015-04-16 ENCOUNTER — Encounter (HOSPITAL_COMMUNITY)
Admission: RE | Admit: 2015-04-16 | Discharge: 2015-04-16 | Disposition: A | Discharge status: Home / Self Care | Payer: Self-pay | Source: Ambulatory Visit | Attending: Cardiovascular Disease | Admitting: Cardiovascular Disease

## 2015-04-18 ENCOUNTER — Encounter (HOSPITAL_COMMUNITY)
Admission: RE | Admit: 2015-04-18 | Discharge: 2015-04-18 | Disposition: A | Discharge status: Home / Self Care | Payer: Self-pay | Source: Ambulatory Visit | Attending: Cardiovascular Disease | Admitting: Cardiovascular Disease

## 2015-04-21 ENCOUNTER — Encounter (HOSPITAL_COMMUNITY)
Admission: RE | Admit: 2015-04-21 | Discharge: 2015-04-21 | Disposition: A | Discharge status: Home / Self Care | Payer: Self-pay | Source: Ambulatory Visit | Attending: Cardiovascular Disease | Admitting: Cardiovascular Disease

## 2015-04-23 ENCOUNTER — Encounter (HOSPITAL_COMMUNITY)
Admission: RE | Admit: 2015-04-23 | Discharge: 2015-04-23 | Disposition: A | Discharge status: Home / Self Care | Payer: Self-pay | Source: Ambulatory Visit | Attending: Internal Medicine | Admitting: Internal Medicine

## 2015-04-23 DIAGNOSIS — Z5189 Encounter for other specified aftercare: Secondary | ICD-10-CM | POA: Insufficient documentation

## 2015-04-23 DIAGNOSIS — J9610 Chronic respiratory failure, unspecified whether with hypoxia or hypercapnia: Secondary | ICD-10-CM | POA: Insufficient documentation

## 2015-04-23 DIAGNOSIS — J479 Bronchiectasis, uncomplicated: Secondary | ICD-10-CM | POA: Insufficient documentation

## 2015-04-25 ENCOUNTER — Encounter (HOSPITAL_COMMUNITY)
Admission: RE | Admit: 2015-04-25 | Discharge: 2015-04-25 | Disposition: A | Discharge status: Home / Self Care | Payer: Self-pay | Source: Ambulatory Visit | Attending: Cardiovascular Disease | Admitting: Cardiovascular Disease

## 2015-04-28 ENCOUNTER — Encounter (HOSPITAL_COMMUNITY)
Admission: RE | Admit: 2015-04-28 | Discharge: 2015-04-28 | Disposition: A | Discharge status: Home / Self Care | Payer: Self-pay | Source: Ambulatory Visit | Attending: Cardiovascular Disease | Admitting: Cardiovascular Disease

## 2015-04-30 ENCOUNTER — Encounter (HOSPITAL_COMMUNITY)
Admission: RE | Admit: 2015-04-30 | Discharge: 2015-04-30 | Disposition: A | Discharge status: Home / Self Care | Payer: Self-pay | Source: Ambulatory Visit | Attending: Cardiovascular Disease | Admitting: Cardiovascular Disease

## 2015-05-02 ENCOUNTER — Encounter (HOSPITAL_COMMUNITY)
Admission: RE | Admit: 2015-05-02 | Discharge: 2015-05-02 | Disposition: A | Discharge status: Home / Self Care | Payer: Self-pay | Source: Ambulatory Visit | Attending: Cardiovascular Disease | Admitting: Cardiovascular Disease

## 2015-05-05 ENCOUNTER — Encounter (HOSPITAL_COMMUNITY)
Admission: RE | Admit: 2015-05-05 | Discharge: 2015-05-05 | Disposition: A | Discharge status: Home / Self Care | Payer: Self-pay | Source: Ambulatory Visit | Attending: Cardiovascular Disease | Admitting: Cardiovascular Disease

## 2015-05-07 ENCOUNTER — Encounter (HOSPITAL_COMMUNITY)
Admission: RE | Admit: 2015-05-07 | Discharge: 2015-05-07 | Disposition: A | Discharge status: Home / Self Care | Payer: Self-pay | Source: Ambulatory Visit | Attending: Cardiovascular Disease | Admitting: Cardiovascular Disease

## 2015-05-09 ENCOUNTER — Encounter (HOSPITAL_COMMUNITY)
Admission: RE | Admit: 2015-05-09 | Discharge: 2015-05-09 | Disposition: A | Discharge status: Home / Self Care | Payer: Self-pay | Source: Ambulatory Visit | Attending: Cardiovascular Disease | Admitting: Cardiovascular Disease

## 2015-05-12 ENCOUNTER — Encounter (HOSPITAL_COMMUNITY)
Admission: RE | Admit: 2015-05-12 | Discharge: 2015-05-12 | Disposition: A | Discharge status: Home / Self Care | Payer: Self-pay | Source: Ambulatory Visit | Attending: Cardiovascular Disease | Admitting: Cardiovascular Disease

## 2015-05-14 ENCOUNTER — Encounter (HOSPITAL_COMMUNITY): Payer: Self-pay

## 2015-05-16 ENCOUNTER — Encounter (HOSPITAL_COMMUNITY)
Admission: RE | Admit: 2015-05-16 | Discharge: 2015-05-16 | Disposition: A | Discharge status: Home / Self Care | Payer: Self-pay | Source: Ambulatory Visit | Attending: Cardiovascular Disease | Admitting: Cardiovascular Disease

## 2015-05-19 ENCOUNTER — Encounter (HOSPITAL_COMMUNITY)
Admission: RE | Admit: 2015-05-19 | Discharge: 2015-05-19 | Disposition: A | Discharge status: Home / Self Care | Payer: Self-pay | Source: Ambulatory Visit | Attending: Cardiovascular Disease | Admitting: Cardiovascular Disease

## 2015-05-21 ENCOUNTER — Encounter (HOSPITAL_COMMUNITY)
Admission: RE | Admit: 2015-05-21 | Discharge: 2015-05-21 | Disposition: A | Discharge status: Home / Self Care | Payer: Self-pay | Source: Ambulatory Visit | Attending: Cardiovascular Disease | Admitting: Cardiovascular Disease

## 2015-05-23 ENCOUNTER — Encounter (HOSPITAL_COMMUNITY)
Admission: RE | Admit: 2015-05-23 | Discharge: 2015-05-23 | Disposition: A | Discharge status: Home / Self Care | Payer: Self-pay | Source: Ambulatory Visit | Attending: Cardiovascular Disease | Admitting: Cardiovascular Disease

## 2015-05-26 ENCOUNTER — Encounter (HOSPITAL_COMMUNITY)
Admission: RE | Admit: 2015-05-26 | Discharge: 2015-05-26 | Disposition: A | Discharge status: Home / Self Care | Payer: Self-pay | Source: Ambulatory Visit | Attending: Internal Medicine | Admitting: Internal Medicine

## 2015-05-26 DIAGNOSIS — J9610 Chronic respiratory failure, unspecified whether with hypoxia or hypercapnia: Secondary | ICD-10-CM | POA: Insufficient documentation

## 2015-05-26 DIAGNOSIS — J479 Bronchiectasis, uncomplicated: Secondary | ICD-10-CM | POA: Insufficient documentation

## 2015-05-26 DIAGNOSIS — Z5189 Encounter for other specified aftercare: Secondary | ICD-10-CM | POA: Insufficient documentation

## 2015-05-28 ENCOUNTER — Encounter (HOSPITAL_COMMUNITY)
Admission: RE | Admit: 2015-05-28 | Discharge: 2015-05-28 | Disposition: A | Discharge status: Home / Self Care | Payer: Self-pay | Source: Ambulatory Visit | Attending: Cardiovascular Disease | Admitting: Cardiovascular Disease

## 2015-05-30 ENCOUNTER — Encounter (HOSPITAL_COMMUNITY)
Admission: RE | Admit: 2015-05-30 | Discharge: 2015-05-30 | Disposition: A | Discharge status: Home / Self Care | Payer: Self-pay | Source: Ambulatory Visit | Attending: Cardiovascular Disease | Admitting: Cardiovascular Disease

## 2015-06-02 ENCOUNTER — Encounter (HOSPITAL_COMMUNITY)
Admission: RE | Admit: 2015-06-02 | Discharge: 2015-06-02 | Disposition: A | Discharge status: Home / Self Care | Payer: Self-pay | Source: Ambulatory Visit | Attending: Cardiovascular Disease | Admitting: Cardiovascular Disease

## 2015-06-04 ENCOUNTER — Encounter (HOSPITAL_COMMUNITY)
Admission: RE | Admit: 2015-06-04 | Discharge: 2015-06-04 | Disposition: A | Discharge status: Home / Self Care | Payer: Self-pay | Source: Ambulatory Visit | Attending: Cardiovascular Disease | Admitting: Cardiovascular Disease

## 2015-06-06 ENCOUNTER — Encounter (HOSPITAL_COMMUNITY)
Admission: RE | Admit: 2015-06-06 | Discharge: 2015-06-06 | Disposition: A | Discharge status: Home / Self Care | Payer: Self-pay | Source: Ambulatory Visit | Attending: Cardiovascular Disease | Admitting: Cardiovascular Disease

## 2015-06-09 ENCOUNTER — Encounter (HOSPITAL_COMMUNITY)
Admission: RE | Admit: 2015-06-09 | Discharge: 2015-06-09 | Disposition: A | Discharge status: Home / Self Care | Payer: Self-pay | Source: Ambulatory Visit | Attending: Cardiovascular Disease | Admitting: Cardiovascular Disease

## 2015-06-11 ENCOUNTER — Encounter (HOSPITAL_COMMUNITY)
Admission: RE | Admit: 2015-06-11 | Discharge: 2015-06-11 | Disposition: A | Discharge status: Home / Self Care | Payer: Self-pay | Source: Ambulatory Visit | Attending: Cardiovascular Disease | Admitting: Cardiovascular Disease

## 2015-06-13 ENCOUNTER — Encounter (HOSPITAL_COMMUNITY)
Admission: RE | Admit: 2015-06-13 | Discharge: 2015-06-13 | Disposition: A | Discharge status: Home / Self Care | Payer: Self-pay | Source: Ambulatory Visit | Attending: Cardiovascular Disease | Admitting: Cardiovascular Disease

## 2015-06-16 ENCOUNTER — Encounter (HOSPITAL_COMMUNITY): Payer: Self-pay

## 2015-06-18 ENCOUNTER — Encounter (HOSPITAL_COMMUNITY)
Admission: RE | Admit: 2015-06-18 | Discharge: 2015-06-18 | Disposition: A | Discharge status: Home / Self Care | Payer: Self-pay | Source: Ambulatory Visit | Attending: Cardiovascular Disease | Admitting: Cardiovascular Disease

## 2015-06-20 ENCOUNTER — Encounter (HOSPITAL_COMMUNITY)
Admission: RE | Admit: 2015-06-20 | Discharge: 2015-06-20 | Disposition: A | Discharge status: Home / Self Care | Payer: Self-pay | Source: Ambulatory Visit | Attending: Cardiovascular Disease | Admitting: Cardiovascular Disease

## 2015-06-23 ENCOUNTER — Encounter (HOSPITAL_COMMUNITY)
Admission: RE | Admit: 2015-06-23 | Discharge: 2015-06-23 | Disposition: A | Discharge status: Home / Self Care | Payer: Self-pay | Source: Ambulatory Visit | Attending: Internal Medicine | Admitting: Internal Medicine

## 2015-06-23 DIAGNOSIS — Z5189 Encounter for other specified aftercare: Secondary | ICD-10-CM | POA: Insufficient documentation

## 2015-06-23 DIAGNOSIS — J479 Bronchiectasis, uncomplicated: Secondary | ICD-10-CM | POA: Insufficient documentation

## 2015-06-23 DIAGNOSIS — J9610 Chronic respiratory failure, unspecified whether with hypoxia or hypercapnia: Secondary | ICD-10-CM | POA: Insufficient documentation

## 2015-06-25 ENCOUNTER — Encounter (HOSPITAL_COMMUNITY): Payer: Self-pay

## 2015-06-27 ENCOUNTER — Encounter (HOSPITAL_COMMUNITY)
Admission: RE | Admit: 2015-06-27 | Discharge: 2015-06-27 | Disposition: A | Discharge status: Home / Self Care | Payer: Self-pay | Source: Ambulatory Visit | Attending: Cardiovascular Disease | Admitting: Cardiovascular Disease

## 2015-06-30 ENCOUNTER — Encounter (HOSPITAL_COMMUNITY)
Admission: RE | Admit: 2015-06-30 | Discharge: 2015-06-30 | Disposition: A | Discharge status: Home / Self Care | Payer: Self-pay | Source: Ambulatory Visit | Attending: Cardiovascular Disease | Admitting: Cardiovascular Disease

## 2015-07-02 ENCOUNTER — Encounter (HOSPITAL_COMMUNITY)
Admission: RE | Admit: 2015-07-02 | Discharge: 2015-07-02 | Disposition: A | Discharge status: Home / Self Care | Payer: Self-pay | Source: Ambulatory Visit | Attending: Cardiovascular Disease | Admitting: Cardiovascular Disease

## 2015-07-04 ENCOUNTER — Encounter (HOSPITAL_COMMUNITY)
Admission: RE | Admit: 2015-07-04 | Discharge: 2015-07-04 | Disposition: A | Discharge status: Home / Self Care | Payer: Self-pay | Source: Ambulatory Visit | Attending: Cardiovascular Disease | Admitting: Cardiovascular Disease

## 2015-07-07 ENCOUNTER — Encounter (HOSPITAL_COMMUNITY)
Admission: RE | Admit: 2015-07-07 | Discharge: 2015-07-07 | Disposition: A | Discharge status: Home / Self Care | Payer: Self-pay | Source: Ambulatory Visit | Attending: Cardiovascular Disease | Admitting: Cardiovascular Disease

## 2015-07-09 ENCOUNTER — Encounter (HOSPITAL_COMMUNITY): Payer: Self-pay

## 2015-07-11 ENCOUNTER — Encounter (HOSPITAL_COMMUNITY): Payer: Self-pay

## 2015-07-14 ENCOUNTER — Encounter (HOSPITAL_COMMUNITY)
Admission: RE | Admit: 2015-07-14 | Discharge: 2015-07-14 | Disposition: A | Discharge status: Home / Self Care | Payer: Self-pay | Source: Ambulatory Visit | Attending: Cardiovascular Disease | Admitting: Cardiovascular Disease

## 2015-07-16 ENCOUNTER — Encounter (HOSPITAL_COMMUNITY)
Admission: RE | Admit: 2015-07-16 | Discharge: 2015-07-16 | Disposition: A | Discharge status: Home / Self Care | Payer: Self-pay | Source: Ambulatory Visit | Attending: Cardiovascular Disease | Admitting: Cardiovascular Disease

## 2015-07-18 ENCOUNTER — Encounter (HOSPITAL_COMMUNITY): Payer: Self-pay

## 2015-07-21 ENCOUNTER — Encounter (HOSPITAL_COMMUNITY): Payer: Self-pay

## 2015-07-23 ENCOUNTER — Encounter (HOSPITAL_COMMUNITY)
Admission: RE | Admit: 2015-07-23 | Discharge: 2015-07-23 | Disposition: A | Discharge status: Home / Self Care | Payer: Self-pay | Source: Ambulatory Visit | Attending: Cardiovascular Disease | Admitting: Cardiovascular Disease

## 2015-07-25 ENCOUNTER — Encounter (HOSPITAL_COMMUNITY)
Admission: RE | Admit: 2015-07-25 | Discharge: 2015-07-25 | Disposition: A | Discharge status: Home / Self Care | Payer: Self-pay | Source: Ambulatory Visit | Attending: Internal Medicine | Admitting: Internal Medicine

## 2015-07-25 DIAGNOSIS — J479 Bronchiectasis, uncomplicated: Secondary | ICD-10-CM | POA: Insufficient documentation

## 2015-07-25 DIAGNOSIS — Z5189 Encounter for other specified aftercare: Secondary | ICD-10-CM | POA: Insufficient documentation

## 2015-07-25 DIAGNOSIS — J9610 Chronic respiratory failure, unspecified whether with hypoxia or hypercapnia: Secondary | ICD-10-CM | POA: Insufficient documentation

## 2015-07-28 ENCOUNTER — Encounter (HOSPITAL_COMMUNITY)
Admission: RE | Admit: 2015-07-28 | Discharge: 2015-07-28 | Disposition: A | Discharge status: Home / Self Care | Payer: Self-pay | Source: Ambulatory Visit | Attending: Cardiovascular Disease | Admitting: Cardiovascular Disease

## 2015-07-30 ENCOUNTER — Encounter (HOSPITAL_COMMUNITY)
Admission: RE | Admit: 2015-07-30 | Discharge: 2015-07-30 | Disposition: A | Discharge status: Home / Self Care | Payer: Self-pay | Source: Ambulatory Visit | Attending: Cardiovascular Disease | Admitting: Cardiovascular Disease

## 2015-08-01 ENCOUNTER — Encounter (HOSPITAL_COMMUNITY)
Admission: RE | Admit: 2015-08-01 | Discharge: 2015-08-01 | Disposition: A | Discharge status: Home / Self Care | Payer: Self-pay | Source: Ambulatory Visit | Attending: Cardiovascular Disease | Admitting: Cardiovascular Disease

## 2015-08-04 ENCOUNTER — Encounter (HOSPITAL_COMMUNITY)
Admission: RE | Admit: 2015-08-04 | Discharge: 2015-08-04 | Disposition: A | Discharge status: Home / Self Care | Payer: Self-pay | Source: Ambulatory Visit | Attending: Cardiovascular Disease | Admitting: Cardiovascular Disease

## 2015-08-06 ENCOUNTER — Encounter (HOSPITAL_COMMUNITY)
Admission: RE | Admit: 2015-08-06 | Discharge: 2015-08-06 | Disposition: A | Discharge status: Home / Self Care | Payer: Self-pay | Source: Ambulatory Visit | Attending: Cardiovascular Disease | Admitting: Cardiovascular Disease

## 2015-08-08 ENCOUNTER — Encounter (HOSPITAL_COMMUNITY)
Admission: RE | Admit: 2015-08-08 | Discharge: 2015-08-08 | Disposition: A | Discharge status: Home / Self Care | Payer: Self-pay | Source: Ambulatory Visit | Attending: Cardiovascular Disease | Admitting: Cardiovascular Disease

## 2015-08-11 ENCOUNTER — Encounter (HOSPITAL_COMMUNITY)
Admission: RE | Admit: 2015-08-11 | Discharge: 2015-08-11 | Disposition: A | Discharge status: Home / Self Care | Payer: Self-pay | Source: Ambulatory Visit | Attending: Cardiovascular Disease | Admitting: Cardiovascular Disease

## 2015-08-13 ENCOUNTER — Encounter (HOSPITAL_COMMUNITY)
Admission: RE | Admit: 2015-08-13 | Discharge: 2015-08-13 | Disposition: A | Discharge status: Home / Self Care | Payer: Self-pay | Source: Ambulatory Visit | Attending: Cardiovascular Disease | Admitting: Cardiovascular Disease

## 2015-08-15 ENCOUNTER — Encounter (HOSPITAL_COMMUNITY)
Admission: RE | Admit: 2015-08-15 | Discharge: 2015-08-15 | Disposition: A | Discharge status: Home / Self Care | Payer: Self-pay | Source: Ambulatory Visit | Attending: Cardiovascular Disease | Admitting: Cardiovascular Disease

## 2015-08-18 ENCOUNTER — Encounter (HOSPITAL_COMMUNITY)
Admission: RE | Admit: 2015-08-18 | Discharge: 2015-08-18 | Disposition: A | Discharge status: Home / Self Care | Payer: Self-pay | Source: Ambulatory Visit | Attending: Cardiovascular Disease | Admitting: Cardiovascular Disease

## 2015-08-20 ENCOUNTER — Encounter (HOSPITAL_COMMUNITY)
Admission: RE | Admit: 2015-08-20 | Discharge: 2015-08-20 | Disposition: A | Discharge status: Home / Self Care | Payer: Self-pay | Source: Ambulatory Visit | Attending: Cardiovascular Disease | Admitting: Cardiovascular Disease

## 2015-08-22 ENCOUNTER — Encounter (HOSPITAL_COMMUNITY)
Admission: RE | Admit: 2015-08-22 | Discharge: 2015-08-22 | Disposition: A | Discharge status: Home / Self Care | Payer: Self-pay | Source: Ambulatory Visit | Attending: Cardiovascular Disease | Admitting: Cardiovascular Disease

## 2015-08-25 ENCOUNTER — Encounter (HOSPITAL_COMMUNITY): Payer: Self-pay

## 2015-08-25 DIAGNOSIS — Z5189 Encounter for other specified aftercare: Secondary | ICD-10-CM | POA: Insufficient documentation

## 2015-08-25 DIAGNOSIS — J479 Bronchiectasis, uncomplicated: Secondary | ICD-10-CM | POA: Insufficient documentation

## 2015-08-25 DIAGNOSIS — J9610 Chronic respiratory failure, unspecified whether with hypoxia or hypercapnia: Secondary | ICD-10-CM | POA: Insufficient documentation

## 2015-08-27 ENCOUNTER — Encounter (HOSPITAL_COMMUNITY): Payer: Self-pay

## 2015-08-29 ENCOUNTER — Encounter (HOSPITAL_COMMUNITY)
Admission: RE | Admit: 2015-08-29 | Discharge: 2015-08-29 | Disposition: A | Discharge status: Home / Self Care | Payer: Self-pay | Source: Ambulatory Visit | Attending: Internal Medicine | Admitting: Internal Medicine

## 2015-09-01 ENCOUNTER — Encounter (HOSPITAL_COMMUNITY)
Admission: RE | Admit: 2015-09-01 | Discharge: 2015-09-01 | Disposition: A | Discharge status: Home / Self Care | Payer: Self-pay | Source: Ambulatory Visit | Attending: Cardiovascular Disease | Admitting: Cardiovascular Disease

## 2015-09-03 ENCOUNTER — Encounter (HOSPITAL_COMMUNITY)
Admission: RE | Admit: 2015-09-03 | Discharge: 2015-09-03 | Disposition: A | Discharge status: Home / Self Care | Payer: Self-pay | Source: Ambulatory Visit | Attending: Cardiovascular Disease | Admitting: Cardiovascular Disease

## 2015-09-05 ENCOUNTER — Encounter (HOSPITAL_COMMUNITY)
Admission: RE | Admit: 2015-09-05 | Discharge: 2015-09-05 | Disposition: A | Discharge status: Home / Self Care | Payer: Self-pay | Source: Ambulatory Visit | Attending: Cardiovascular Disease | Admitting: Cardiovascular Disease

## 2015-09-08 ENCOUNTER — Encounter (HOSPITAL_COMMUNITY)
Admission: RE | Admit: 2015-09-08 | Discharge: 2015-09-08 | Disposition: A | Discharge status: Home / Self Care | Payer: Self-pay | Source: Ambulatory Visit | Attending: Cardiovascular Disease | Admitting: Cardiovascular Disease

## 2015-09-10 ENCOUNTER — Encounter (HOSPITAL_COMMUNITY)
Admission: RE | Admit: 2015-09-10 | Discharge: 2015-09-10 | Disposition: A | Discharge status: Home / Self Care | Payer: Self-pay | Source: Ambulatory Visit | Attending: Cardiovascular Disease | Admitting: Cardiovascular Disease

## 2015-09-12 ENCOUNTER — Encounter (HOSPITAL_COMMUNITY)
Admission: RE | Admit: 2015-09-12 | Discharge: 2015-09-12 | Disposition: A | Discharge status: Home / Self Care | Payer: Self-pay | Source: Ambulatory Visit | Attending: Cardiovascular Disease | Admitting: Cardiovascular Disease

## 2015-09-15 ENCOUNTER — Encounter (HOSPITAL_COMMUNITY)
Admission: RE | Admit: 2015-09-15 | Discharge: 2015-09-15 | Disposition: A | Discharge status: Home / Self Care | Payer: Self-pay | Source: Ambulatory Visit | Attending: Cardiovascular Disease | Admitting: Cardiovascular Disease

## 2015-09-17 ENCOUNTER — Encounter (HOSPITAL_COMMUNITY)
Admission: RE | Admit: 2015-09-17 | Discharge: 2015-09-17 | Disposition: A | Discharge status: Home / Self Care | Payer: Self-pay | Source: Ambulatory Visit | Attending: Cardiovascular Disease | Admitting: Cardiovascular Disease

## 2015-09-19 ENCOUNTER — Encounter (HOSPITAL_COMMUNITY)
Admission: RE | Admit: 2015-09-19 | Discharge: 2015-09-19 | Disposition: A | Discharge status: Home / Self Care | Payer: Self-pay | Source: Ambulatory Visit | Attending: Cardiovascular Disease | Admitting: Cardiovascular Disease

## 2015-09-22 ENCOUNTER — Encounter (HOSPITAL_COMMUNITY)
Admission: RE | Admit: 2015-09-22 | Discharge: 2015-09-22 | Disposition: A | Discharge status: Home / Self Care | Payer: Self-pay | Source: Ambulatory Visit | Attending: Cardiovascular Disease | Admitting: Cardiovascular Disease

## 2015-09-24 ENCOUNTER — Encounter (HOSPITAL_COMMUNITY)
Admission: RE | Admit: 2015-09-24 | Discharge: 2015-09-24 | Disposition: A | Discharge status: Home / Self Care | Payer: Self-pay | Source: Ambulatory Visit | Attending: Internal Medicine | Admitting: Internal Medicine

## 2015-09-24 DIAGNOSIS — J479 Bronchiectasis, uncomplicated: Secondary | ICD-10-CM | POA: Insufficient documentation

## 2015-09-24 DIAGNOSIS — J9610 Chronic respiratory failure, unspecified whether with hypoxia or hypercapnia: Secondary | ICD-10-CM | POA: Insufficient documentation

## 2015-09-24 DIAGNOSIS — Z5189 Encounter for other specified aftercare: Secondary | ICD-10-CM | POA: Insufficient documentation

## 2015-09-26 ENCOUNTER — Encounter (HOSPITAL_COMMUNITY)
Admission: RE | Admit: 2015-09-26 | Discharge: 2015-09-26 | Disposition: A | Discharge status: Home / Self Care | Payer: Self-pay | Source: Ambulatory Visit | Attending: Cardiovascular Disease | Admitting: Cardiovascular Disease

## 2015-09-29 ENCOUNTER — Encounter (HOSPITAL_COMMUNITY)
Admission: RE | Admit: 2015-09-29 | Discharge: 2015-09-29 | Disposition: A | Discharge status: Home / Self Care | Payer: Self-pay | Source: Ambulatory Visit | Attending: Cardiovascular Disease | Admitting: Cardiovascular Disease

## 2015-10-01 ENCOUNTER — Encounter (HOSPITAL_COMMUNITY)
Admission: RE | Admit: 2015-10-01 | Discharge: 2015-10-01 | Disposition: A | Discharge status: Home / Self Care | Payer: Self-pay | Source: Ambulatory Visit | Attending: Cardiovascular Disease | Admitting: Cardiovascular Disease

## 2015-10-03 ENCOUNTER — Encounter (HOSPITAL_COMMUNITY)
Admission: RE | Admit: 2015-10-03 | Discharge: 2015-10-03 | Disposition: A | Discharge status: Home / Self Care | Payer: Self-pay | Source: Ambulatory Visit | Attending: Cardiovascular Disease | Admitting: Cardiovascular Disease

## 2015-10-06 ENCOUNTER — Ambulatory Visit (HOSPITAL_COMMUNITY)
Admission: RE | Admit: 2015-10-06 | Discharge: 2015-10-06 | Disposition: A | Discharge status: Home / Self Care | Payer: BLUE CROSS/BLUE SHIELD | Source: Ambulatory Visit | Attending: Internal Medicine | Admitting: Internal Medicine

## 2015-10-06 ENCOUNTER — Encounter (HOSPITAL_COMMUNITY): Payer: Self-pay

## 2015-10-06 DIAGNOSIS — M810 Age-related osteoporosis without current pathological fracture: Secondary | ICD-10-CM | POA: Insufficient documentation

## 2015-10-06 MED ORDER — DENOSUMAB 60 MG/ML ~~LOC~~ SOLN
60.0000 mg | Freq: Once | SUBCUTANEOUS | Status: AC
  Administered 2015-10-06: 60 mg via SUBCUTANEOUS
  Filled 2015-10-06: qty 1

## 2015-10-06 NOTE — Discharge Instructions (Signed)
Denosumab injection  What is this medicine?  DENOSUMAB (den oh sue mab) slows bone breakdown. Prolia is used to treat osteoporosis in women after menopause and in men. Xgeva is used to prevent bone fractures and other bone problems caused by cancer bone metastases. Xgeva is also used to treat giant cell tumor of the bone.  This medicine may be used for other purposes; ask your health care provider or pharmacist if you have questions.  What should I tell my health care provider before I take this medicine?  They need to know if you have any of these conditions:  -dental disease  -eczema  -infection or history of infections  -kidney disease or on dialysis  -low blood calcium or vitamin D  -malabsorption syndrome  -scheduled to have surgery or tooth extraction  -taking medicine that contains denosumab  -thyroid or parathyroid disease  -an unusual reaction to denosumab, other medicines, foods, dyes, or preservatives  -pregnant or trying to get pregnant  -breast-feeding  How should I use this medicine?  This medicine is for injection under the skin. It is given by a health care professional in a hospital or clinic setting.  If you are getting Prolia, a special MedGuide will be given to you by the pharmacist with each prescription and refill. Be sure to read this information carefully each time.  For Prolia, talk to your pediatrician regarding the use of this medicine in children. Special care may be needed. For Xgeva, talk to your pediatrician regarding the use of this medicine in children. While this drug may be prescribed for children as young as 13 years for selected conditions, precautions do apply.  Overdosage: If you think you have taken too much of this medicine contact a poison control center or emergency room at once.  NOTE: This medicine is only for you. Do not share this medicine with others.  What if I miss a dose?  It is important not to miss your dose. Call your doctor or health care professional if you are  unable to keep an appointment.  What may interact with this medicine?  Do not take this medicine with any of the following medications:  -other medicines containing denosumab  This medicine may also interact with the following medications:  -medicines that suppress the immune system  -medicines that treat cancer  -steroid medicines like prednisone or cortisone  This list may not describe all possible interactions. Give your health care provider a list of all the medicines, herbs, non-prescription drugs, or dietary supplements you use. Also tell them if you smoke, drink alcohol, or use illegal drugs. Some items may interact with your medicine.  What should I watch for while using this medicine?  Visit your doctor or health care professional for regular checks on your progress. Your doctor or health care professional may order blood tests and other tests to see how you are doing.  Call your doctor or health care professional if you get a cold or other infection while receiving this medicine. Do not treat yourself. This medicine may decrease your body's ability to fight infection.  You should make sure you get enough calcium and vitamin D while you are taking this medicine, unless your doctor tells you not to. Discuss the foods you eat and the vitamins you take with your health care professional.  See your dentist regularly. Brush and floss your teeth as directed. Before you have any dental work done, tell your dentist you are receiving this medicine.  Do   effects to an unborn child. Talk to your health care professional or pharmacist for more information. What side effects may I notice from receiving this medicine? Side effects that you should report to your doctor or health care professional as  soon as possible: -allergic reactions like skin rash, itching or hives, swelling of the face, lips, or tongue -breathing problems -chest pain -fast, irregular heartbeat -feeling faint or lightheaded, falls -fever, chills, or any other sign of infection -muscle spasms, tightening, or twitches -numbness or tingling -skin blisters or bumps, or is dry, peels, or red -slow healing or unexplained pain in the mouth or jaw -unusual bleeding or bruising Side effects that usually do not require medical attention (Report these to your doctor or health care professional if they continue or are bothersome.): -muscle pain -stomach upset, gas This list may not describe all possible side effects. Call your doctor for medical advice about side effects. You may report side effects to FDA at 1-800-FDA-1088. Where should I keep my medicine? This medicine is only given in a clinic, doctor's office, or other health care setting and will not be stored at home. NOTE: This sheet is a summary. It may not cover all possible information. If you have questions about this medicine, talk to your doctor, pharmacist, or health care provider.    2016, Elsevier/Gold Standard. (2011-08-09 12:37:47)

## 2015-10-08 ENCOUNTER — Encounter (HOSPITAL_COMMUNITY)
Admission: RE | Admit: 2015-10-08 | Discharge: 2015-10-08 | Disposition: A | Discharge status: Home / Self Care | Payer: Self-pay | Source: Ambulatory Visit | Attending: Cardiovascular Disease | Admitting: Cardiovascular Disease

## 2015-10-10 ENCOUNTER — Encounter (HOSPITAL_COMMUNITY)
Admission: RE | Admit: 2015-10-10 | Discharge: 2015-10-10 | Disposition: A | Discharge status: Home / Self Care | Payer: Self-pay | Source: Ambulatory Visit | Attending: Cardiovascular Disease | Admitting: Cardiovascular Disease

## 2015-10-13 ENCOUNTER — Encounter (HOSPITAL_COMMUNITY)
Admission: RE | Admit: 2015-10-13 | Discharge: 2015-10-13 | Disposition: A | Discharge status: Home / Self Care | Payer: Self-pay | Source: Ambulatory Visit | Attending: Cardiovascular Disease | Admitting: Cardiovascular Disease

## 2015-10-15 ENCOUNTER — Encounter (HOSPITAL_COMMUNITY)
Admission: RE | Admit: 2015-10-15 | Discharge: 2015-10-15 | Disposition: A | Discharge status: Home / Self Care | Payer: Self-pay | Source: Ambulatory Visit | Attending: Cardiovascular Disease | Admitting: Cardiovascular Disease

## 2015-10-17 ENCOUNTER — Encounter (HOSPITAL_COMMUNITY)
Admission: RE | Admit: 2015-10-17 | Discharge: 2015-10-17 | Disposition: A | Discharge status: Home / Self Care | Payer: Self-pay | Source: Ambulatory Visit | Attending: Cardiovascular Disease | Admitting: Cardiovascular Disease

## 2015-10-20 ENCOUNTER — Encounter (HOSPITAL_COMMUNITY): Payer: Self-pay

## 2015-10-22 ENCOUNTER — Encounter (HOSPITAL_COMMUNITY)
Admission: RE | Admit: 2015-10-22 | Discharge: 2015-10-22 | Disposition: A | Discharge status: Home / Self Care | Payer: Self-pay | Source: Ambulatory Visit | Attending: Cardiovascular Disease | Admitting: Cardiovascular Disease

## 2015-10-24 ENCOUNTER — Encounter (HOSPITAL_COMMUNITY)
Admission: RE | Admit: 2015-10-24 | Discharge: 2015-10-24 | Disposition: A | Discharge status: Home / Self Care | Payer: Self-pay | Source: Ambulatory Visit | Attending: Internal Medicine | Admitting: Internal Medicine

## 2015-10-24 DIAGNOSIS — J9610 Chronic respiratory failure, unspecified whether with hypoxia or hypercapnia: Secondary | ICD-10-CM | POA: Insufficient documentation

## 2015-10-24 DIAGNOSIS — J479 Bronchiectasis, uncomplicated: Secondary | ICD-10-CM | POA: Insufficient documentation

## 2015-10-24 DIAGNOSIS — Z5189 Encounter for other specified aftercare: Secondary | ICD-10-CM | POA: Insufficient documentation

## 2015-10-27 ENCOUNTER — Encounter (HOSPITAL_COMMUNITY): Payer: Self-pay

## 2015-10-29 ENCOUNTER — Encounter (HOSPITAL_COMMUNITY)
Admission: RE | Admit: 2015-10-29 | Discharge: 2015-10-29 | Disposition: A | Discharge status: Home / Self Care | Payer: Self-pay | Source: Ambulatory Visit | Attending: Cardiovascular Disease | Admitting: Cardiovascular Disease

## 2015-10-31 ENCOUNTER — Encounter (HOSPITAL_COMMUNITY)
Admission: RE | Admit: 2015-10-31 | Discharge: 2015-10-31 | Disposition: A | Discharge status: Home / Self Care | Payer: Self-pay | Source: Ambulatory Visit | Attending: Cardiovascular Disease | Admitting: Cardiovascular Disease

## 2015-11-03 ENCOUNTER — Encounter (HOSPITAL_COMMUNITY): Payer: Self-pay

## 2015-11-05 ENCOUNTER — Encounter (HOSPITAL_COMMUNITY)
Admission: RE | Admit: 2015-11-05 | Discharge: 2015-11-05 | Disposition: A | Discharge status: Home / Self Care | Payer: Self-pay | Source: Ambulatory Visit | Attending: Cardiovascular Disease | Admitting: Cardiovascular Disease

## 2015-11-07 ENCOUNTER — Encounter (HOSPITAL_COMMUNITY)
Admission: RE | Admit: 2015-11-07 | Discharge: 2015-11-07 | Disposition: A | Discharge status: Home / Self Care | Payer: Self-pay | Source: Ambulatory Visit | Attending: Cardiovascular Disease | Admitting: Cardiovascular Disease

## 2015-11-10 ENCOUNTER — Encounter (HOSPITAL_COMMUNITY)
Admission: RE | Admit: 2015-11-10 | Discharge: 2015-11-10 | Disposition: A | Discharge status: Home / Self Care | Payer: Self-pay | Source: Ambulatory Visit | Attending: Cardiovascular Disease | Admitting: Cardiovascular Disease

## 2015-11-12 ENCOUNTER — Encounter (HOSPITAL_COMMUNITY): Payer: Self-pay

## 2015-11-14 ENCOUNTER — Encounter (HOSPITAL_COMMUNITY): Payer: Self-pay

## 2015-11-17 ENCOUNTER — Encounter (HOSPITAL_COMMUNITY): Payer: Self-pay

## 2015-11-19 ENCOUNTER — Encounter (HOSPITAL_COMMUNITY): Admission: RE | Admit: 2015-11-19 | Payer: Self-pay | Source: Ambulatory Visit

## 2015-11-21 ENCOUNTER — Encounter (HOSPITAL_COMMUNITY): Payer: Self-pay

## 2015-11-24 ENCOUNTER — Encounter (HOSPITAL_COMMUNITY)
Admission: RE | Admit: 2015-11-24 | Discharge: 2015-11-24 | Disposition: A | Discharge status: Home / Self Care | Payer: Self-pay | Source: Ambulatory Visit | Attending: Internal Medicine | Admitting: Internal Medicine

## 2015-11-24 DIAGNOSIS — J479 Bronchiectasis, uncomplicated: Secondary | ICD-10-CM | POA: Insufficient documentation

## 2015-11-24 DIAGNOSIS — J9610 Chronic respiratory failure, unspecified whether with hypoxia or hypercapnia: Secondary | ICD-10-CM | POA: Insufficient documentation

## 2015-11-24 DIAGNOSIS — Z5189 Encounter for other specified aftercare: Secondary | ICD-10-CM | POA: Insufficient documentation

## 2015-11-26 ENCOUNTER — Encounter (HOSPITAL_COMMUNITY)
Admission: RE | Admit: 2015-11-26 | Discharge: 2015-11-26 | Disposition: A | Discharge status: Home / Self Care | Payer: Self-pay | Source: Ambulatory Visit | Attending: Cardiovascular Disease | Admitting: Cardiovascular Disease

## 2015-11-28 ENCOUNTER — Encounter (HOSPITAL_COMMUNITY)
Admission: RE | Admit: 2015-11-28 | Discharge: 2015-11-28 | Disposition: A | Discharge status: Home / Self Care | Payer: Self-pay | Source: Ambulatory Visit | Attending: Cardiovascular Disease | Admitting: Cardiovascular Disease

## 2015-12-01 ENCOUNTER — Encounter (HOSPITAL_COMMUNITY)
Admission: RE | Admit: 2015-12-01 | Discharge: 2015-12-01 | Disposition: A | Discharge status: Home / Self Care | Payer: Self-pay | Source: Ambulatory Visit | Attending: Cardiovascular Disease | Admitting: Cardiovascular Disease

## 2015-12-03 ENCOUNTER — Encounter (HOSPITAL_COMMUNITY)
Admission: RE | Admit: 2015-12-03 | Discharge: 2015-12-03 | Disposition: A | Discharge status: Home / Self Care | Payer: Self-pay | Source: Ambulatory Visit | Attending: Cardiovascular Disease | Admitting: Cardiovascular Disease

## 2015-12-05 ENCOUNTER — Encounter (HOSPITAL_COMMUNITY)
Admission: RE | Admit: 2015-12-05 | Discharge: 2015-12-05 | Disposition: A | Discharge status: Home / Self Care | Payer: Self-pay | Source: Ambulatory Visit | Attending: Cardiovascular Disease | Admitting: Cardiovascular Disease

## 2015-12-08 ENCOUNTER — Encounter (HOSPITAL_COMMUNITY)
Admission: RE | Admit: 2015-12-08 | Discharge: 2015-12-08 | Disposition: A | Discharge status: Home / Self Care | Payer: Self-pay | Source: Ambulatory Visit | Attending: Cardiovascular Disease | Admitting: Cardiovascular Disease

## 2015-12-10 ENCOUNTER — Encounter (HOSPITAL_COMMUNITY)
Admission: RE | Admit: 2015-12-10 | Discharge: 2015-12-10 | Disposition: A | Discharge status: Home / Self Care | Payer: Self-pay | Source: Ambulatory Visit | Attending: Cardiovascular Disease | Admitting: Cardiovascular Disease

## 2015-12-12 ENCOUNTER — Encounter (HOSPITAL_COMMUNITY): Payer: Self-pay

## 2015-12-15 ENCOUNTER — Encounter (HOSPITAL_COMMUNITY)
Admission: RE | Admit: 2015-12-15 | Discharge: 2015-12-15 | Disposition: A | Discharge status: Home / Self Care | Payer: Self-pay | Source: Ambulatory Visit | Attending: Cardiovascular Disease | Admitting: Cardiovascular Disease

## 2015-12-17 ENCOUNTER — Encounter (HOSPITAL_COMMUNITY)
Admission: RE | Admit: 2015-12-17 | Discharge: 2015-12-17 | Disposition: A | Discharge status: Home / Self Care | Payer: Self-pay | Source: Ambulatory Visit | Attending: Cardiovascular Disease | Admitting: Cardiovascular Disease

## 2015-12-19 ENCOUNTER — Encounter (HOSPITAL_COMMUNITY)
Admission: RE | Admit: 2015-12-19 | Discharge: 2015-12-19 | Disposition: A | Discharge status: Home / Self Care | Payer: Self-pay | Source: Ambulatory Visit | Attending: Cardiovascular Disease | Admitting: Cardiovascular Disease

## 2015-12-22 ENCOUNTER — Encounter (HOSPITAL_COMMUNITY)
Admission: RE | Admit: 2015-12-22 | Discharge: 2015-12-22 | Disposition: A | Discharge status: Home / Self Care | Payer: Self-pay | Source: Ambulatory Visit | Attending: Cardiovascular Disease | Admitting: Cardiovascular Disease

## 2015-12-24 ENCOUNTER — Encounter (HOSPITAL_COMMUNITY)
Admission: RE | Admit: 2015-12-24 | Discharge: 2015-12-24 | Disposition: A | Discharge status: Home / Self Care | Payer: Self-pay | Source: Ambulatory Visit | Attending: Internal Medicine | Admitting: Internal Medicine

## 2015-12-24 DIAGNOSIS — J9610 Chronic respiratory failure, unspecified whether with hypoxia or hypercapnia: Secondary | ICD-10-CM | POA: Insufficient documentation

## 2015-12-24 DIAGNOSIS — J479 Bronchiectasis, uncomplicated: Secondary | ICD-10-CM | POA: Insufficient documentation

## 2015-12-24 DIAGNOSIS — Z5189 Encounter for other specified aftercare: Secondary | ICD-10-CM | POA: Insufficient documentation

## 2015-12-26 ENCOUNTER — Encounter (HOSPITAL_COMMUNITY)
Admission: RE | Admit: 2015-12-26 | Discharge: 2015-12-26 | Disposition: A | Discharge status: Home / Self Care | Payer: Self-pay | Source: Ambulatory Visit | Attending: Internal Medicine | Admitting: Internal Medicine

## 2015-12-29 ENCOUNTER — Encounter (HOSPITAL_COMMUNITY)
Admission: RE | Admit: 2015-12-29 | Discharge: 2015-12-29 | Disposition: A | Discharge status: Home / Self Care | Payer: Self-pay | Source: Ambulatory Visit | Attending: Internal Medicine | Admitting: Internal Medicine

## 2015-12-31 ENCOUNTER — Encounter (HOSPITAL_COMMUNITY)
Admission: RE | Admit: 2015-12-31 | Discharge: 2015-12-31 | Disposition: A | Discharge status: Home / Self Care | Payer: Self-pay | Source: Ambulatory Visit | Attending: Internal Medicine | Admitting: Internal Medicine

## 2016-01-02 ENCOUNTER — Encounter (HOSPITAL_COMMUNITY)
Admission: RE | Admit: 2016-01-02 | Discharge: 2016-01-02 | Disposition: A | Discharge status: Home / Self Care | Payer: Self-pay | Source: Ambulatory Visit | Attending: Internal Medicine | Admitting: Internal Medicine

## 2016-01-05 ENCOUNTER — Encounter (HOSPITAL_COMMUNITY)
Admission: RE | Admit: 2016-01-05 | Discharge: 2016-01-05 | Disposition: A | Discharge status: Home / Self Care | Payer: Self-pay | Source: Ambulatory Visit | Attending: Internal Medicine | Admitting: Internal Medicine

## 2016-01-07 ENCOUNTER — Encounter (HOSPITAL_COMMUNITY)
Admission: RE | Admit: 2016-01-07 | Discharge: 2016-01-07 | Disposition: A | Discharge status: Home / Self Care | Payer: Self-pay | Source: Ambulatory Visit | Attending: Internal Medicine | Admitting: Internal Medicine

## 2016-01-09 ENCOUNTER — Encounter (HOSPITAL_COMMUNITY)
Admission: RE | Admit: 2016-01-09 | Discharge: 2016-01-09 | Disposition: A | Discharge status: Home / Self Care | Payer: Self-pay | Source: Ambulatory Visit | Attending: Internal Medicine | Admitting: Internal Medicine

## 2016-01-12 ENCOUNTER — Encounter (HOSPITAL_COMMUNITY): Payer: Self-pay

## 2016-01-14 ENCOUNTER — Encounter (HOSPITAL_COMMUNITY): Payer: Self-pay

## 2016-01-16 ENCOUNTER — Encounter (HOSPITAL_COMMUNITY): Payer: Self-pay

## 2016-01-19 ENCOUNTER — Encounter (HOSPITAL_COMMUNITY): Payer: Self-pay

## 2016-01-21 ENCOUNTER — Encounter (HOSPITAL_COMMUNITY): Admission: RE | Admit: 2016-01-21 | Payer: Self-pay | Source: Ambulatory Visit

## 2016-01-23 ENCOUNTER — Encounter (HOSPITAL_COMMUNITY): Payer: BLUE CROSS/BLUE SHIELD

## 2016-01-23 DIAGNOSIS — J9610 Chronic respiratory failure, unspecified whether with hypoxia or hypercapnia: Secondary | ICD-10-CM | POA: Insufficient documentation

## 2016-01-23 DIAGNOSIS — J479 Bronchiectasis, uncomplicated: Secondary | ICD-10-CM | POA: Insufficient documentation

## 2016-01-23 DIAGNOSIS — Z5189 Encounter for other specified aftercare: Secondary | ICD-10-CM | POA: Insufficient documentation

## 2016-01-26 ENCOUNTER — Encounter (HOSPITAL_COMMUNITY): Payer: BLUE CROSS/BLUE SHIELD

## 2016-01-28 ENCOUNTER — Encounter (HOSPITAL_COMMUNITY): Admission: RE | Admit: 2016-01-28 | Payer: BLUE CROSS/BLUE SHIELD | Source: Ambulatory Visit

## 2016-01-30 ENCOUNTER — Encounter (HOSPITAL_COMMUNITY): Admission: RE | Admit: 2016-01-30 | Payer: BLUE CROSS/BLUE SHIELD | Source: Ambulatory Visit

## 2016-02-02 ENCOUNTER — Encounter (HOSPITAL_COMMUNITY): Payer: BLUE CROSS/BLUE SHIELD

## 2016-02-03 ENCOUNTER — Encounter (HOSPITAL_COMMUNITY): Payer: Self-pay | Admitting: *Deleted

## 2016-02-03 NOTE — Progress Notes (Signed)
Deborah [name] has been in the hospital for a week at [facility]. She states that she is weak and is not sure that she can return to the Maintenance program. I discussed with her that I felt that she should consider the Under Graduate program. Brettney agrees with this recommendation. We will start the process of getting a referral from her pulmonologist.

## 2016-02-04 ENCOUNTER — Encounter (HOSPITAL_COMMUNITY): Payer: BLUE CROSS/BLUE SHIELD

## 2016-02-06 ENCOUNTER — Encounter (HOSPITAL_COMMUNITY)
Admission: RE | Admit: 2016-02-06 | Discharge: 2016-02-06 | Disposition: A | Discharge status: Home / Self Care | Payer: BLUE CROSS/BLUE SHIELD | Source: Ambulatory Visit | Attending: Internal Medicine | Admitting: Internal Medicine

## 2016-02-06 ENCOUNTER — Encounter (HOSPITAL_COMMUNITY): Payer: BLUE CROSS/BLUE SHIELD

## 2016-02-06 ENCOUNTER — Encounter (HOSPITAL_COMMUNITY): Payer: Self-pay

## 2016-02-06 VITALS — BP 153/66 | HR 122 | Resp 20 | Ht 65.0 in | Wt 121.3 lb

## 2016-02-06 DIAGNOSIS — J9610 Chronic respiratory failure, unspecified whether with hypoxia or hypercapnia: Secondary | ICD-10-CM | POA: Diagnosis not present

## 2016-02-06 DIAGNOSIS — Z5189 Encounter for other specified aftercare: Secondary | ICD-10-CM | POA: Diagnosis not present

## 2016-02-06 DIAGNOSIS — J479 Bronchiectasis, uncomplicated: Secondary | ICD-10-CM | POA: Diagnosis not present

## 2016-02-06 DIAGNOSIS — I272 Pulmonary hypertension, unspecified: Secondary | ICD-10-CM

## 2016-02-06 NOTE — Progress Notes (Signed)
Deborah [name] 65 y.o. female Pulmonary Rehab Orientation Note Patient arrived today in Cardiac and Pulmonary Rehab for orientation to Pulmonary Rehab. She was transported from [employer] via wheel chair by her husband. She does carry portable oxygen. Per pt, she uses oxygen continuously. Color good, skin warm and dry. Patient is oriented to time and place. Patient's medical history, psychosocial health, and medications reviewed. Psychosocial assessment reveals pt lives with their spouse. Pt is currently unemployed. Pt hobbies include reading riding her horse however she has not been able to ride since her last hospitalization. Pt reports her stress level is moderate. Areas of stress/anxiety include Health.  Pt does not exhibit signs of depression. PHQ2/9 score 0/na. Pt shows good  coping skills with positive outlook. She is offered emotional support and reassurance. Will continue to monitor and evaluate progress toward psychosocial goal(s) of feeling more confident in being independent with ADLs. Physical assessment reveals heart rate is tachycardic. This is patients norm. Upper lobes clear bilat with fine crackles noted in both bases.Grip strength equal, strong. Distal pulses palpable. Patient reports she does take medications as prescribed. Patient states she follows a Regular diet. The patient reports no specific efforts to gain or lose weight.. Patient's weight will be monitored closely. Demonstration and practice of PLB using pulse oximeter. Patient able to return demonstration satisfactorily. Safety and hand hygiene in the exercise area reviewed with patient. Patient voices understanding of the information reviewed. Department expectations discussed with patient and achievable goals were set. The patient shows enthusiasm about attending the program and we look forward to working with this nice lady. The patient is scheduled for a 6 min walk test on 02/12/16 and to begin exercise on 02/19/16 in the  1:30 class.   45 minutes was spent on a variety of activities such as assessment of the patient, obtaining baseline data including height, weight, BMI, and grip strength, verifying medical history, allergies, and current medications, and teaching patient strategies for performing tasks with less respiratory effort with emphasis on pursed lip breathing.

## 2016-02-09 ENCOUNTER — Encounter (HOSPITAL_COMMUNITY): Payer: BLUE CROSS/BLUE SHIELD

## 2016-02-11 ENCOUNTER — Encounter (HOSPITAL_COMMUNITY): Payer: BLUE CROSS/BLUE SHIELD

## 2016-02-12 ENCOUNTER — Encounter (HOSPITAL_COMMUNITY)
Admission: RE | Admit: 2016-02-12 | Discharge: 2016-02-12 | Disposition: A | Discharge status: Home / Self Care | Payer: BLUE CROSS/BLUE SHIELD | Source: Ambulatory Visit

## 2016-02-12 ENCOUNTER — Encounter (HOSPITAL_COMMUNITY): Payer: BLUE CROSS/BLUE SHIELD

## 2016-02-12 DIAGNOSIS — Z5189 Encounter for other specified aftercare: Secondary | ICD-10-CM | POA: Diagnosis not present

## 2016-02-12 DIAGNOSIS — I272 Pulmonary hypertension, unspecified: Secondary | ICD-10-CM

## 2016-02-13 ENCOUNTER — Encounter (HOSPITAL_COMMUNITY): Payer: BLUE CROSS/BLUE SHIELD

## 2016-02-13 NOTE — Progress Notes (Signed)
Pulmonary Individual Treatment Plan  Patient Details  Name: Deborah [name] MRN: [id number] Date of Birth: 1950-05-12 Referring Provider:   [name] Pulmonary Rehab Walk Test from 02/12/2016 in [place]  Referring Provider  Dr. Nelda Marseille      Initial Encounter Date:  Flowsheet Row Pulmonary Rehab Walk Test from 02/12/2016 in [place]  Date  02/13/16  Referring Provider  Dr. Nelda Marseille      Visit Diagnosis: Pulmonary hypertension  Patient's Home Medications on Admission:   Current Outpatient Prescriptions:  .  albuterol (PROVENTIL HFA;VENTOLIN HFA) 108 (90 BASE) MCG/ACT inhaler, Inhale 2 puffs into the lungs every 6 (six) hours as needed. For shortness of breath., Disp: 1 Inhaler, Rfl: 3 .  calcium-vitamin D (OSCAL WITH D) 500-200 MG-UNIT per tablet, Take 1 tablet by mouth daily., Disp: , Rfl:  .  cholecalciferol (VITAMIN D) 1000 units tablet, Take 2,000 Units by mouth 2 (two) times a week., Disp: , Rfl:  .  cholestyramine light (PREVALITE) 4 GM/DOSE powder, Take 1-2 scoopfulls at bedtime as needed, Disp: 239.4 g, Rfl: 1 .  denosumab (PROLIA) 60 MG/ML SOLN injection, Inject 60 mg into the skin every 6 (six) months. Administer in upper arm, thigh, or abdomen, Disp: , Rfl:  .  DULoxetine (CYMBALTA) 60 MG capsule, Take 60 mg by mouth every evening. , Disp: , Rfl:  .  furosemide (LASIX) 40 MG tablet, Take 40 mg by mouth daily. , Disp: , Rfl:  .  gabapentin (NEURONTIN) 400 MG capsule, Take 400 mg by mouth 4 (four) times daily., Disp: , Rfl:  .  guaiFENesin (MUCINEX) 600 MG 12 hr tablet, Take 600 mg by mouth daily. , Disp: , Rfl:  .  lidocaine (LIDODERM) 5 %, Place 1 patch onto the skin daily. Remove & Discard patch within 12 hours or as directed by MD, Disp: , Rfl:  .  mometasone-formoterol (DULERA) 100-5 MCG/ACT AERO, Inhale 2 puffs into the lungs 2 (two) times daily., Disp: 1 Inhaler, Rfl: 3 .  Multiple Vitamin  (MULTIVITAMIN WITH MINERALS) TABS, Take 1 tablet by mouth daily., Disp: , Rfl:  .  mycophenolate (CELLCEPT) 500 MG tablet, Take 500 mg by mouth as directed. , Disp: , Rfl:  .  oxycodone (OXY-IR) 5 MG capsule, Take 5 mg by mouth every 6 (six) hours as needed., Disp: , Rfl:  .  OXYGEN, Inhale into the lungs. Continuous, 4 L/min when exercising and 3 L/min when sitting, Disp: , Rfl:  .  pantoprazole (PROTONIX) 40 MG tablet, Take by mouth., Disp: , Rfl:  .  Polyethyl Glycol-Propyl Glycol 0.4-0.3 % SOLN, Apply to eye., Disp: , Rfl:  .  potassium chloride SA (K-DUR,KLOR-CON) 20 MEQ tablet, Take by mouth., Disp: , Rfl:  .  predniSONE (DELTASONE) 10 MG tablet, Take by mouth., Disp: , Rfl:  .  predniSONE (DELTASONE) 5 MG tablet, Take by mouth., Disp: , Rfl:  .  saccharomyces boulardii (FLORASTOR) 250 MG capsule, Take 1 capsule (250 mg total) by mouth 2 (two) times daily., Disp: 14 capsule, Rfl: 0 .  Vitamin D, Ergocalciferol, (DRISDOL) 50000 UNITS CAPS, Take 50,000 Units by mouth every 7 (seven) days. , Disp: , Rfl:   Past Medical History: Past Medical History:  Diagnosis Date  . Anemia   . C. difficile colitis   . CAD (coronary artery disease)   . Cataract   . CHF (congestive heart failure) (HCC)    right sided heart failure from pulmonary hypertension  .  COPD (chronic obstructive pulmonary disease) ([place])   . Gallstones   . Hearing loss    right  . Hyperactive airway disease   . Hyperlipidemia   . Hypertension   . Lung cancer ([place]) carcinoid tumor  2004  . Osteopenia   . Pneumonia   . Raynaud's disease   . Restrictive lung disease   . Shingles   . Tubulovillous adenoma of rectum 2007    Tobacco Use: History  Smoking Status  . Never Smoker  Smokeless Tobacco  . Never Used    Labs: Recent Review Flowsheet Data    Labs for ITP Cardiac and Pulmonary Rehab Latest Ref Rng & Units 12/07/2006 01/24/2008 02/03/2009 02/25/2010 03/01/2011   Cholestrol 0 - 200 mg/dL 248(HH) 251(HH) 238(H)  247(H) 211(H)   LDLDIRECT mg/dL 167.0 173.7 165.7 172.0 143.1   HDL >39.00 mg/dL 41.9 49.3 47.70 53.50 44.00   Trlycerides 0.0 - 149.0 mg/dL 207(HH) 165(H) 140.0 137.0 105.0      Capillary Blood Glucose: No results found for: GLUCAP   ADL UCSD:   Pulmonary Function Assessment:     Pulmonary Function Assessment - 02/06/16 1502      Breath   Bilateral Breath Sounds Other   Other fine crackles bases bilat right greater than left   Shortness of Breath Fear of Shortness of Breath;Limiting activity      Exercise Target Goals: Date: 02/13/16  Exercise Program Goal: Individual exercise prescription set with THRR, safety & activity barriers. Participant demonstrates ability to understand and report RPE using BORG scale, to self-measure pulse accurately, and to acknowledge the importance of the exercise prescription.  Exercise Prescription Goal: Starting with aerobic activity 30 plus minutes a day, 3 days per week for initial exercise prescription. Provide home exercise prescription and guidelines that participant acknowledges understanding prior to discharge.  Activity Barriers & Risk Stratification:     Activity Barriers & Cardiac Risk Stratification - 02/06/16 1500      Activity Barriers & Cardiac Risk Stratification   Activity Barriers Deconditioning;Muscular Weakness;Shortness of Breath      6 Minute Walk:     6 Minute Walk    Row Name 02/13/16 0744         6 Minute Walk   Phase Initial     Distance 552 feet     Walk Time -  5 minutes and 45 seconds     # of Rest Breaks 1  15 seconds     MPH 1.04     METS 1.77     RPE 14     Perceived Dyspnea  4     Symptoms No     Resting HR 124 bpm     Resting BP 140/70     Max Ex. HR 142 bpm     Max Ex. BP 160/60     2 Minute Post BP 124/64       Interval HR   Baseline HR 124     1 Minute HR 124     2 Minute HR 128     3 Minute HR 132     4 Minute HR 135     5 Minute HR 139     6 Minute HR 142     2 Minute  Post HR 122     Interval Heart Rate? Yes       Interval Oxygen   Interval Oxygen? Yes     Baseline Oxygen Saturation % 94 %     Baseline Liters  of Oxygen 4 L     1 Minute Oxygen Saturation % 92 %     1 Minute Liters of Oxygen 4 L     2 Minute Oxygen Saturation % 89 %     2 Minute Liters of Oxygen 4 L     3 Minute Oxygen Saturation % 89 %     3 Minute Liters of Oxygen 4 L     4 Minute Oxygen Saturation % 88 %     4 Minute Liters of Oxygen 4 L     5 Minute Oxygen Saturation % 87 %     5 Minute Liters of Oxygen 4 L     6 Minute Oxygen Saturation % 89 %     6 Minute Liters of Oxygen 4 L     2 Minute Post Oxygen Saturation % 100 %     2 Minute Post Liters of Oxygen 4 L        Initial Exercise Prescription:     Initial Exercise Prescription - 02/13/16 0700      Date of Initial Exercise RX and Referring Provider   Date 02/13/16   Referring Provider Dr. Nelda Marseille     Oxygen   Oxygen Continuous   Liters 4     Bike   Level 0.3   Minutes 17     NuStep   Level 1   Minutes 17   METs 1.5     Track   Laps 5   Minutes 17     Prescription Details   Frequency (times per week) 2   Duration Progress to 45 minutes of aerobic exercise without signs/symptoms of physical distress     Intensity   THRR 40-80% of Max Heartrate 63-126   Ratings of Perceived Exertion 11-13   Perceived Dyspnea 0-4     Progression   Progression Continue progressive overload as per policy without signs/symptoms or physical distress.     Resistance Training   Training Prescription Yes   Weight orange bands   Reps 10-12      Perform Capillary Blood Glucose checks as needed.  Exercise Prescription Changes:   Exercise Comments:   Discharge Exercise Prescription (Final Exercise Prescription Changes):    Nutrition:  Target Goals: Understanding of nutrition guidelines, daily intake of sodium <1538m, cholesterol <2051m calories 30% from fat and 7% or less from saturated fats, daily to have 5  or more servings of fruits and vegetables.  Biometrics:    Nutrition Therapy Plan and Nutrition Goals:   Nutrition Discharge: Rate Your Plate Scores:   Psychosocial: Target Goals: Acknowledge presence or absence of depression, maximize coping skills, provide positive support system. Participant is able to verbalize types and ability to use techniques and skills needed for reducing stress and depression.  Initial Review & Psychosocial Screening:     Initial Psych Review & Screening - 02/06/16 15[place]     Barriers   Psychosocial barriers to participate in program The patient should benefit from training in stress management and relaxation.     Screening Interventions   Interventions Encouraged to exercise      Quality of Life Scores:   PHQ-9: Recent Review Flowsheet Data    [facility] 02/06/2016 07/09/2014 01/21/2014   Decreased Interest 1 1 0   Down, Depressed, Hopeless 0 0 0   PHQ - 2 Score 1 1 0      Psychosocial  Evaluation and Intervention:     Psychosocial Evaluation - 02/06/16 1504      Psychosocial Evaluation & Interventions   Interventions Encouraged to exercise with the program and follow exercise prescription      Psychosocial Re-Evaluation:  Education: Education Goals: Education classes will be provided on a weekly basis, covering required topics. Participant will state understanding/return demonstration of topics presented.  Learning Barriers/Preferences:     Learning Barriers/Preferences - 02/06/16 1500      Learning Barriers/Preferences   Learning Barriers None   Learning Preferences Computer/Internet;Group Instruction;Verbal Instruction;Written Material      Education Topics: Risk Factor Reduction:  -Group instruction that is supported by a PowerPoint presentation. Instructor discusses the definition of a risk factor, different risk factors for pulmonary disease, and how the  heart and lungs work together.     Nutrition for Pulmonary Patient:  -Group instruction provided by PowerPoint slides, verbal discussion, and written materials to support subject matter. The instructor gives an explanation and review of healthy diet recommendations, which includes a discussion on weight management, recommendations for fruit and vegetable consumption, as well as protein, fluid, caffeine, fiber, sodium, sugar, and alcohol. Tips for eating when patients are short of breath are discussed.   Pursed Lip Breathing:  -Group instruction that is supported by demonstration and informational handouts. Instructor discusses the benefits of pursed lip and diaphragmatic breathing and detailed demonstration on how to preform both.     Oxygen Safety:  -Group instruction provided by PowerPoint, verbal discussion, and written material to support subject matter. There is an overview of "What is Oxygen" and "Why do we need it".  Instructor also reviews how to create a safe environment for oxygen use, the importance of using oxygen as prescribed, and the risks of noncompliance. There is a brief discussion on traveling with oxygen and resources the patient may utilize.   Oxygen Equipment:  -Group instruction provided by [facility] Staff utilizing handouts, written materials, and equipment demonstrations.   Signs and Symptoms:  -Group instruction provided by written material and verbal discussion to support subject matter. Warning signs and symptoms of infection, stroke, and heart attack are reviewed and when to call the physician/911 reinforced. Tips for preventing the spread of infection discussed.   Advanced Directives:  -Group instruction provided by verbal instruction and written material to support subject matter. Instructor reviews Advanced Directive laws and proper instruction for filling out document.   Pulmonary Video:  -Group video education that reviews the importance of medication  and oxygen compliance, exercise, good nutrition, pulmonary hygiene, and pursed lip and diaphragmatic breathing for the pulmonary patient.   Exercise for the Pulmonary Patient:  -Group instruction that is supported by a PowerPoint presentation. Instructor discusses benefits of exercise, core components of exercise, frequency, duration, and intensity of an exercise routine, importance of utilizing pulse oximetry during exercise, safety while exercising, and options of places to exercise outside of rehab.     Pulmonary Medications:  -Verbally interactive group education provided by instructor with focus on inhaled medications and proper administration.   Anatomy and Physiology of the Respiratory System and Intimacy:  -Group instruction provided by PowerPoint, verbal discussion, and written material to support subject matter. Instructor reviews respiratory cycle and anatomical components of the respiratory system and their functions. Instructor also reviews differences in obstructive and restrictive respiratory diseases with examples of each. Intimacy, Sex, and Sexuality differences are reviewed with a discussion on how relationships can change when diagnosed with pulmonary disease. Common sexual concerns are  reviewed.   Knowledge Questionnaire Score:   Core Components/Risk Factors/Patient Goals at Admission:     Personal Goals and Risk Factors at Admission - 02/06/16 1503      Core Components/Risk Factors/Patient Goals on Admission    Weight Management Weight Maintenance   Sedentary Yes   Intervention Provide advice, education, support and counseling about physical activity/exercise needs.;Develop an individualized exercise prescription for aerobic and resistive training based on initial evaluation findings, risk stratification, comorbidities and participant's personal goals.   Expected Outcomes Achievement of increased cardiorespiratory fitness and enhanced flexibility, muscular endurance  and strength shown through measurements of functional capacity and personal statement of participant.   Increase Strength and Stamina Yes   Intervention Provide advice, education, support and counseling about physical activity/exercise needs.;Develop an individualized exercise prescription for aerobic and resistive training based on initial evaluation findings, risk stratification, comorbidities and participant's personal goals.   Expected Outcomes Achievement of increased cardiorespiratory fitness and enhanced flexibility, muscular endurance and strength shown through measurements of functional capacity and personal statement of participant.   Improve shortness of breath with ADL's Yes   Intervention Provide education, individualized exercise plan and daily activity instruction to help decrease symptoms of SOB with activities of daily living.   Expected Outcomes Short Term: Achieves a reduction of symptoms when performing activities of daily living.      Core Components/Risk Factors/Patient Goals Review:    Core Components/Risk Factors/Patient Goals at Discharge (Final Review):    ITP Comments:   Comments: .

## 2016-02-16 ENCOUNTER — Encounter (HOSPITAL_COMMUNITY): Payer: BLUE CROSS/BLUE SHIELD

## 2016-02-18 ENCOUNTER — Encounter (HOSPITAL_COMMUNITY): Payer: BLUE CROSS/BLUE SHIELD

## 2016-02-19 ENCOUNTER — Encounter (HOSPITAL_COMMUNITY)
Admission: RE | Admit: 2016-02-19 | Discharge: 2016-02-19 | Disposition: A | Discharge status: Home / Self Care | Payer: BLUE CROSS/BLUE SHIELD | Source: Ambulatory Visit | Attending: Internal Medicine | Admitting: Internal Medicine

## 2016-02-19 VITALS — Wt 121.9 lb

## 2016-02-19 DIAGNOSIS — I272 Pulmonary hypertension, unspecified: Secondary | ICD-10-CM

## 2016-02-19 DIAGNOSIS — Z5189 Encounter for other specified aftercare: Secondary | ICD-10-CM | POA: Diagnosis not present

## 2016-02-19 NOTE — Progress Notes (Signed)
Pulmonary Individual Treatment Plan  Patient Details  Name: Deborah [name] MRN: [id number] Date of Birth: Jul 12, 1950 Referring Provider:   [name] Pulmonary Rehab Walk Test from 02/12/2016 in [place]  Referring Provider  Dr. Nelda Marseille      Initial Encounter Date:  Flowsheet Row Pulmonary Rehab Walk Test from 02/12/2016 in [place]  Date  02/13/16  Referring Provider  Dr. Nelda Marseille      Visit Diagnosis: Pulmonary hypertension  Patient's Home Medications on Admission:   Current Outpatient Prescriptions:  .  albuterol (PROVENTIL HFA;VENTOLIN HFA) 108 (90 BASE) MCG/ACT inhaler, Inhale 2 puffs into the lungs every 6 (six) hours as needed. For shortness of breath., Disp: 1 Inhaler, Rfl: 3 .  calcium-vitamin D (OSCAL WITH D) 500-200 MG-UNIT per tablet, Take 1 tablet by mouth daily., Disp: , Rfl:  .  cholecalciferol (VITAMIN D) 1000 units tablet, Take 2,000 Units by mouth 2 (two) times a week., Disp: , Rfl:  .  cholestyramine light (PREVALITE) 4 GM/DOSE powder, Take 1-2 scoopfulls at bedtime as needed, Disp: 239.4 g, Rfl: 1 .  denosumab (PROLIA) 60 MG/ML SOLN injection, Inject 60 mg into the skin every 6 (six) months. Administer in upper arm, thigh, or abdomen, Disp: , Rfl:  .  DULoxetine (CYMBALTA) 60 MG capsule, Take 60 mg by mouth every evening. , Disp: , Rfl:  .  furosemide (LASIX) 40 MG tablet, Take 40 mg by mouth daily. , Disp: , Rfl:  .  gabapentin (NEURONTIN) 400 MG capsule, Take 400 mg by mouth 4 (four) times daily., Disp: , Rfl:  .  guaiFENesin (MUCINEX) 600 MG 12 hr tablet, Take 600 mg by mouth daily. , Disp: , Rfl:  .  lidocaine (LIDODERM) 5 %, Place 1 patch onto the skin daily. Remove & Discard patch within 12 hours or as directed by MD, Disp: , Rfl:  .  mometasone-formoterol (DULERA) 100-5 MCG/ACT AERO, Inhale 2 puffs into the lungs 2 (two) times daily., Disp: 1 Inhaler, Rfl: 3 .  Multiple Vitamin  (MULTIVITAMIN WITH MINERALS) TABS, Take 1 tablet by mouth daily., Disp: , Rfl:  .  mycophenolate (CELLCEPT) 500 MG tablet, Take 500 mg by mouth as directed. , Disp: , Rfl:  .  oxycodone (OXY-IR) 5 MG capsule, Take 5 mg by mouth every 6 (six) hours as needed., Disp: , Rfl:  .  OXYGEN, Inhale into the lungs. Continuous, 4 L/min when exercising and 3 L/min when sitting, Disp: , Rfl:  .  pantoprazole (PROTONIX) 40 MG tablet, Take by mouth., Disp: , Rfl:  .  Polyethyl Glycol-Propyl Glycol 0.4-0.3 % SOLN, Apply to eye., Disp: , Rfl:  .  potassium chloride SA (K-DUR,KLOR-CON) 20 MEQ tablet, Take by mouth., Disp: , Rfl:  .  predniSONE (DELTASONE) 10 MG tablet, Take by mouth., Disp: , Rfl:  .  predniSONE (DELTASONE) 5 MG tablet, Take by mouth., Disp: , Rfl:  .  saccharomyces boulardii (FLORASTOR) 250 MG capsule, Take 1 capsule (250 mg total) by mouth 2 (two) times daily., Disp: 14 capsule, Rfl: 0 .  Vitamin D, Ergocalciferol, (DRISDOL) 50000 UNITS CAPS, Take 50,000 Units by mouth every 7 (seven) days. , Disp: , Rfl:   Past Medical History: Past Medical History:  Diagnosis Date  . Anemia   . C. difficile colitis   . CAD (coronary artery disease)   . Cataract   . CHF (congestive heart failure) (HCC)    right sided heart failure from pulmonary hypertension  .  COPD (chronic obstructive pulmonary disease) ([place])   . Gallstones   . Hearing loss    right  . Hyperactive airway disease   . Hyperlipidemia   . Hypertension   . Lung cancer ([place]) carcinoid tumor  2004  . Osteopenia   . Pneumonia   . Raynaud's disease   . Restrictive lung disease   . Shingles   . Tubulovillous adenoma of rectum 2007    Tobacco Use: History  Smoking Status  . Never Smoker  Smokeless Tobacco  . Never Used    Labs: Recent Review Flowsheet Data    Labs for ITP Cardiac and Pulmonary Rehab Latest Ref Rng & Units 12/07/2006 01/24/2008 02/03/2009 02/25/2010 03/01/2011   Cholestrol 0 - 200 mg/dL 248(HH) 251(HH) 238(H)  247(H) 211(H)   LDLDIRECT mg/dL 167.0 173.7 165.7 172.0 143.1   HDL >39.00 mg/dL 41.9 49.3 47.70 53.50 44.00   Trlycerides 0.0 - 149.0 mg/dL 207(HH) 165(H) 140.0 137.0 105.0      Capillary Blood Glucose: No results found for: GLUCAP   ADL UCSD:   Pulmonary Function Assessment:     Pulmonary Function Assessment - 02/06/16 1502      Breath   Bilateral Breath Sounds Other   Other fine crackles bases bilat right greater than left   Shortness of Breath Fear of Shortness of Breath;Limiting activity      Exercise Target Goals:    Exercise Program Goal: Individual exercise prescription set with THRR, safety & activity barriers. Participant demonstrates ability to understand and report RPE using BORG scale, to self-measure pulse accurately, and to acknowledge the importance of the exercise prescription.  Exercise Prescription Goal: Starting with aerobic activity 30 plus minutes a day, 3 days per week for initial exercise prescription. Provide home exercise prescription and guidelines that participant acknowledges understanding prior to discharge.  Activity Barriers & Risk Stratification:     Activity Barriers & Cardiac Risk Stratification - 02/06/16 1500      Activity Barriers & Cardiac Risk Stratification   Activity Barriers Deconditioning;Muscular Weakness;Shortness of Breath      6 Minute Walk:     6 Minute Walk    Row Name 02/13/16 0744         6 Minute Walk   Phase Initial     Distance 552 feet     Walk Time -  5 minutes and 45 seconds     # of Rest Breaks 1  15 seconds     MPH 1.04     METS 1.77     RPE 14     Perceived Dyspnea  4     Symptoms No     Resting HR 124 bpm     Resting BP 140/70     Max Ex. HR 142 bpm     Max Ex. BP 160/60     2 Minute Post BP 124/64       Interval HR   Baseline HR 124     1 Minute HR 124     2 Minute HR 128     3 Minute HR 132     4 Minute HR 135     5 Minute HR 139     6 Minute HR 142     2 Minute Post HR 122      Interval Heart Rate? Yes       Interval Oxygen   Interval Oxygen? Yes     Baseline Oxygen Saturation % 94 %     Baseline Liters  of Oxygen 4 L     1 Minute Oxygen Saturation % 92 %     1 Minute Liters of Oxygen 4 L     2 Minute Oxygen Saturation % 89 %     2 Minute Liters of Oxygen 4 L     3 Minute Oxygen Saturation % 89 %     3 Minute Liters of Oxygen 4 L     4 Minute Oxygen Saturation % 88 %     4 Minute Liters of Oxygen 4 L     5 Minute Oxygen Saturation % 87 %     5 Minute Liters of Oxygen 4 L     6 Minute Oxygen Saturation % 89 %     6 Minute Liters of Oxygen 4 L     2 Minute Post Oxygen Saturation % 100 %     2 Minute Post Liters of Oxygen 4 L        Initial Exercise Prescription:     Initial Exercise Prescription - 02/13/16 0700      Date of Initial Exercise RX and Referring Provider   Date 02/13/16   Referring Provider Dr. Nelda Marseille     Oxygen   Oxygen Continuous   Liters 4     Bike   Level 0.3   Minutes 17     NuStep   Level 1   Minutes 17   METs 1.5     Track   Laps 5   Minutes 17     Prescription Details   Frequency (times per week) 2   Duration Progress to 45 minutes of aerobic exercise without signs/symptoms of physical distress     Intensity   THRR 40-80% of Max Heartrate 63-126   Ratings of Perceived Exertion 11-13   Perceived Dyspnea 0-4     Progression   Progression Continue progressive overload as per policy without signs/symptoms or physical distress.     Resistance Training   Training Prescription Yes   Weight orange bands   Reps 10-12      Perform Capillary Blood Glucose checks as needed.  Exercise Prescription Changes:     Exercise Prescription Changes    Row Name 02/19/16 1500             Response to Exercise   Blood Pressure (Admit) 140/60       Blood Pressure (Exercise) 132/60       Blood Pressure (Exit) 142/70       Heart Rate (Admit) 121 bpm       Heart Rate (Exercise) 122 bpm       Heart Rate (Exit) 116  bpm       Oxygen Saturation (Admit) 92 %       Oxygen Saturation (Exercise) 9592 %       Oxygen Saturation (Exit) 96 %       Duration Progress to 45 minutes of aerobic exercise without signs/symptoms of physical distress       Intensity -  40-80% HRR         Progression   Progression Continue to progress workloads to maintain intensity without signs/symptoms of physical distress.         Resistance Training   Training Prescription Yes       Weight orange bands       Reps 10-12  10 minutes of strength training         Interval Training   Interval Training No  Recumbant Bike   Level 1       Minutes 17         NuStep   Level 1       Minutes 17          Exercise Comments:   Discharge Exercise Prescription (Final Exercise Prescription Changes):     Exercise Prescription Changes - 02/19/16 1500      Response to Exercise   Blood Pressure (Admit) 140/60   Blood Pressure (Exercise) 132/60   Blood Pressure (Exit) 142/70   Heart Rate (Admit) 121 bpm   Heart Rate (Exercise) 122 bpm   Heart Rate (Exit) 116 bpm   Oxygen Saturation (Admit) 92 %   Oxygen Saturation (Exercise) 9592 %   Oxygen Saturation (Exit) 96 %   Duration Progress to 45 minutes of aerobic exercise without signs/symptoms of physical distress   Intensity --  40-80% HRR     Progression   Progression Continue to progress workloads to maintain intensity without signs/symptoms of physical distress.     Resistance Training   Training Prescription Yes   Weight orange bands   Reps 10-12  10 minutes of strength training     Interval Training   Interval Training No     Recumbant Bike   Level 1   Minutes 17     NuStep   Level 1   Minutes 17       Nutrition:  Target Goals: Understanding of nutrition guidelines, daily intake of sodium <159m, cholesterol <2072m calories 30% from fat and 7% or less from saturated fats, daily to have 5 or more servings of fruits and vegetables.  Biometrics:      Pre Biometrics - 02/19/16 1554      Pre Biometrics   Grip Strength 22 kg       Nutrition Therapy Plan and Nutrition Goals:   Nutrition Discharge: Rate Your Plate Scores:   Psychosocial: Target Goals: Acknowledge presence or absence of depression, maximize coping skills, provide positive support system. Participant is able to verbalize types and ability to use techniques and skills needed for reducing stress and depression.  Initial Review & Psychosocial Screening:     Initial Psych Review & Screening - 02/06/16 [id number]     Barriers   Psychosocial barriers to participate in program The patient should benefit from training in stress management and relaxation.     Screening Interventions   Interventions Encouraged to exercise      Quality of Life Scores:   PHQ-9: Recent Review Flowsheet Data    [facility] 02/06/2016 07/09/2014 01/21/2014   Decreased Interest 1 1 0   Down, Depressed, Hopeless 0 0 0   PHQ - 2 Score 1 1 0      Psychosocial Evaluation and Intervention:     Psychosocial Evaluation - 02/06/16 1504      Psychosocial Evaluation & Interventions   Interventions Encouraged to exercise with the program and follow exercise prescription      Psychosocial Re-Evaluation:     Psychosocial Re-Evaluation    [place] 02/19/16 1633             Psychosocial Re-Evaluation   Interventions Encouraged to attend Pulmonary Rehabilitation for the exercise       Comments no psychosocial issue identified       Continued Psychosocial Services Needed No         Education: Education Goals: Education  classes will be provided on a weekly basis, covering required topics. Participant will state understanding/return demonstration of topics presented.  Learning Barriers/Preferences:     Learning Barriers/Preferences - 02/06/16 1500      Learning Barriers/Preferences   Learning Barriers None    Learning Preferences Computer/Internet;Group Instruction;Verbal Instruction;Written Material      Education Topics: Risk Factor Reduction:  -Group instruction that is supported by a PowerPoint presentation. Instructor discusses the definition of a risk factor, different risk factors for pulmonary disease, and how the heart and lungs work together.     Nutrition for Pulmonary Patient:  -Group instruction provided by PowerPoint slides, verbal discussion, and written materials to support subject matter. The instructor gives an explanation and review of healthy diet recommendations, which includes a discussion on weight management, recommendations for fruit and vegetable consumption, as well as protein, fluid, caffeine, fiber, sodium, sugar, and alcohol. Tips for eating when patients are short of breath are discussed.   Pursed Lip Breathing:  -Group instruction that is supported by demonstration and informational handouts. Instructor discusses the benefits of pursed lip and diaphragmatic breathing and detailed demonstration on how to preform both.     Oxygen Safety:  -Group instruction provided by PowerPoint, verbal discussion, and written material to support subject matter. There is an overview of "What is Oxygen" and "Why do we need it".  Instructor also reviews how to create a safe environment for oxygen use, the importance of using oxygen as prescribed, and the risks of noncompliance. There is a brief discussion on traveling with oxygen and resources the patient may utilize.   Oxygen Equipment:  -Group instruction provided by [facility] Staff utilizing handouts, written materials, and equipment demonstrations.   Signs and Symptoms:  -Group instruction provided by written material and verbal discussion to support subject matter. Warning signs and symptoms of infection, stroke, and heart attack are reviewed and when to call the physician/911 reinforced. Tips for preventing the spread of  infection discussed.   Advanced Directives:  -Group instruction provided by verbal instruction and written material to support subject matter. Instructor reviews Advanced Directive laws and proper instruction for filling out document.   Pulmonary Video:  -Group video education that reviews the importance of medication and oxygen compliance, exercise, good nutrition, pulmonary hygiene, and pursed lip and diaphragmatic breathing for the pulmonary patient. Flowsheet Row PULMONARY REHAB OTHER RESPIRATORY from 02/19/2016 in [place]  Date  02/19/16  Educator  video  Instruction Review Code  2- meets goals/outcomes      Exercise for the Pulmonary Patient:  -Group instruction that is supported by a PowerPoint presentation. Instructor discusses benefits of exercise, core components of exercise, frequency, duration, and intensity of an exercise routine, importance of utilizing pulse oximetry during exercise, safety while exercising, and options of places to exercise outside of rehab.     Pulmonary Medications:  -Verbally interactive group education provided by instructor with focus on inhaled medications and proper administration.   Anatomy and Physiology of the Respiratory System and Intimacy:  -Group instruction provided by PowerPoint, verbal discussion, and written material to support subject matter. Instructor reviews respiratory cycle and anatomical components of the respiratory system and their functions. Instructor also reviews differences in obstructive and restrictive respiratory diseases with examples of each. Intimacy, Sex, and Sexuality differences are reviewed with a discussion on how relationships can change when diagnosed with pulmonary disease. Common sexual concerns are reviewed.   Knowledge Questionnaire Score:   Core Components/Risk Factors/Patient Goals  at Admission:     Personal Goals and Risk Factors at Admission - 02/19/16 1631       Core Components/Risk Factors/Patient Goals on Admission    Weight Management Weight Maintenance      Core Components/Risk Factors/Patient Goals Review:      Goals and Risk Factor Review    Row Name 02/19/16 1632             Core Components/Risk Factors/Patient Goals Review   Personal Goals Review Increase Strength and Stamina;Improve shortness of breath with ADL's;Weight Management/Obesity       Review Just started program today, work on weight management, too early to see any progression.          Core Components/Risk Factors/Patient Goals at Discharge (Final Review):      Goals and Risk Factor Review - 02/19/16 1632      Core Components/Risk Factors/Patient Goals Review   Personal Goals Review Increase Strength and Stamina;Improve shortness of breath with ADL's;Weight Management/Obesity   Review Just started program today, work on weight management, too early to see any progression.      ITP Comments:   Comments: ITP REVIEW Pt is making expected progress toward pulmonary rehab goals after completing 1 sessions. Recommend continued exercise, life style modification, education, and utilization of breathing techniques to increase stamina and strength and decrease shortness of breath with exertion.

## 2016-02-19 NOTE — Progress Notes (Signed)
Daily Session Note  Patient Details  Name: Deborah [name] MRN: [id number] Date of Birth: [birth year] Referring Provider:   [name] Pulmonary Rehab Walk Test from 02/12/2016 in [place]  Referring Provider  Dr. Nelda Marseille      Encounter Date: 02/19/2016  Check In:     Session Check In - 02/19/16 1330      Check-In   Location [facility]   Staff Present Trish Fountain, RN, Maxcine Ham, RN, Roque Cash, RN   Supervising physician immediately available to respond to emergencies [facility] immediately available   Physician(s) Dr. Wynetta Emery   Medication changes reported     No   Fall or balance concerns reported    No   Warm-up and Cool-down Performed as group-led instruction   Resistance Training Performed Yes   VAD Patient? No     Pain Assessment   Currently in Pain? No/denies   Multiple Pain Sites No      Capillary Blood Glucose: No results found for this or any previous visit (from the past 24 hour(s)).      Exercise Prescription Changes - 02/19/16 1500      Response to Exercise   Blood Pressure (Admit) 140/60   Blood Pressure (Exercise) 132/60   Blood Pressure (Exit) 142/70   Heart Rate (Admit) 121 bpm   Heart Rate (Exercise) 122 bpm   Heart Rate (Exit) 116 bpm   Oxygen Saturation (Admit) 92 %   Oxygen Saturation (Exercise) 9592 %   Oxygen Saturation (Exit) 96 %   Duration Progress to 45 minutes of aerobic exercise without signs/symptoms of physical distress   Intensity --  40-80% HRR     Progression   Progression Continue to progress workloads to maintain intensity without signs/symptoms of physical distress.     Resistance Training   Training Prescription Yes   Weight orange bands   Reps 10-12  10 minutes of strength training     Interval Training   Interval Training No     Recumbant Bike   Level 1   Minutes 17     NuStep   Level 1   Minutes 17     Goals Met:  Exercise  tolerated well Strength training completed today  Goals Unmet:  Not Applicable  Comments: Service time is from1330  to 1515    Dr. Rush Farmer is Medical Director for Pulmonary Rehab at [facility].

## 2016-02-20 ENCOUNTER — Encounter (HOSPITAL_COMMUNITY): Payer: BLUE CROSS/BLUE SHIELD

## 2016-02-23 ENCOUNTER — Encounter (HOSPITAL_COMMUNITY): Payer: BLUE CROSS/BLUE SHIELD

## 2016-02-24 ENCOUNTER — Encounter (HOSPITAL_COMMUNITY)
Admission: RE | Admit: 2016-02-24 | Discharge: 2016-02-24 | Disposition: A | Discharge status: Home / Self Care | Payer: BLUE CROSS/BLUE SHIELD | Source: Ambulatory Visit | Attending: Internal Medicine | Admitting: Internal Medicine

## 2016-02-24 VITALS — Wt 122.4 lb

## 2016-02-24 DIAGNOSIS — J9610 Chronic respiratory failure, unspecified whether with hypoxia or hypercapnia: Secondary | ICD-10-CM | POA: Insufficient documentation

## 2016-02-24 DIAGNOSIS — I272 Pulmonary hypertension, unspecified: Secondary | ICD-10-CM

## 2016-02-24 DIAGNOSIS — Z5189 Encounter for other specified aftercare: Secondary | ICD-10-CM | POA: Diagnosis not present

## 2016-02-24 DIAGNOSIS — J479 Bronchiectasis, uncomplicated: Secondary | ICD-10-CM | POA: Diagnosis not present

## 2016-02-24 NOTE — Progress Notes (Signed)
Daily Session Note  Patient Details  Name: Deborah [name] MRN: [id number] Date of Birth: 03/26/1950 Referring Provider:   [name] Pulmonary Rehab Walk Test from 02/12/2016 in [place]  Referring Provider  Dr. Nelda Marseille      Encounter Date: 02/24/2016  Check In:     Session Check In - 02/24/16 1324      Check-In   Location [facility]   Staff Present Rosebud Poles, RN, BSN;Molly diVincenzo, MS, ACSM RCEP, Exercise Physiologist;[place] Ysidro Evert, RN   Supervising physician immediately available to respond to emergencies [facility] immediately available   Physician(s) Dr. Quincy Simmonds   Medication changes reported     No   Fall or balance concerns reported    No   Warm-up and Cool-down Performed as group-led instruction   Resistance Training Performed Yes   VAD Patient? No     Pain Assessment   Currently in Pain? No/denies   Multiple Pain Sites No      Capillary Blood Glucose: No results found for this or any previous visit (from the past 24 hour(s)).      Exercise Prescription Changes - 02/24/16 1500      Response to Exercise   Blood Pressure (Admit) 102/60   Blood Pressure (Exercise) 160/80   Blood Pressure (Exit) 126/62   Heart Rate (Admit) 120 bpm   Heart Rate (Exercise) 130 bpm   Heart Rate (Exit) 115 bpm   Oxygen Saturation (Admit) 99 %   Oxygen Saturation (Exercise) 89 %   Oxygen Saturation (Exit) 96 %   Rating of Perceived Exertion (Exercise) 13   Perceived Dyspnea (Exercise) 2   Duration Progress to 45 minutes of aerobic exercise without signs/symptoms of physical distress   Intensity THRR unchanged     Progression   Progression Continue to progress workloads to maintain intensity without signs/symptoms of physical distress.     Resistance Training   Training Prescription Yes   Weight orange bands   Reps 10-12  10 minutes of strength training     Interval Training   Interval Training No     Oxygen   Oxygen Continuous   Liters 4     Recumbant Bike   Level 1   Minutes 17     NuStep   Level 1   Minutes 17   METs 1.4     Track   Laps 5   Minutes 17     Goals Met:  Exercise tolerated well No report of cardiac concerns or symptoms Strength training completed today  Goals Unmet:  Not Applicable  Comments: Service time is from 1030 to 1450    Dr. Rush Farmer is Medical Director for Pulmonary Rehab at [facility].

## 2016-02-25 ENCOUNTER — Encounter (HOSPITAL_COMMUNITY): Payer: BLUE CROSS/BLUE SHIELD

## 2016-02-26 ENCOUNTER — Encounter (HOSPITAL_COMMUNITY)
Admission: RE | Admit: 2016-02-26 | Discharge: 2016-02-26 | Disposition: A | Discharge status: Home / Self Care | Payer: BLUE CROSS/BLUE SHIELD | Source: Ambulatory Visit | Attending: Internal Medicine | Admitting: Internal Medicine

## 2016-02-26 VITALS — Wt 121.7 lb

## 2016-02-26 DIAGNOSIS — Z5189 Encounter for other specified aftercare: Secondary | ICD-10-CM | POA: Diagnosis not present

## 2016-02-26 DIAGNOSIS — I272 Pulmonary hypertension, unspecified: Secondary | ICD-10-CM

## 2016-02-26 NOTE — Progress Notes (Signed)
Daily Session Note  Patient Details  Name: Deborah [name] MRN: [id number] Date of Birth: 07/13/1950 Referring Provider:   [name] Pulmonary Rehab Walk Test from 02/12/2016 in [place]  Referring Provider  Dr. Nelda Marseille      Encounter Date: 02/26/2016  Check In:     Session Check In - 02/26/16 1335      Check-In   Location [facility]   Staff Present Rosebud Poles, RN, BSN;Molly diVincenzo, MS, ACSM RCEP, Exercise Physiologist;Lisa Ysidro Evert, RN;Portia Rollene Rotunda, RN, BSN   Supervising physician immediately available to respond to emergencies [facility] immediately available   Physician(s) Dr. Tana Coast   Medication changes reported     No   Fall or balance concerns reported    No   Warm-up and Cool-down Performed as group-led instruction   Resistance Training Performed Yes   VAD Patient? No     Pain Assessment   Currently in Pain? No/denies   Multiple Pain Sites No      Capillary Blood Glucose: No results found for this or any previous visit (from the past 24 hour(s)).      Exercise Prescription Changes - 02/26/16 1600      Response to Exercise   Blood Pressure (Admit) 150/66   Blood Pressure (Exercise) 210/80  she will add her lasix back in her medicines,    Blood Pressure (Exit) 150/50   Heart Rate (Admit) 124 bpm   Heart Rate (Exercise) 139 bpm   Heart Rate (Exit) 128 bpm   Oxygen Saturation (Admit) 94 %   Oxygen Saturation (Exercise) 88 %   Oxygen Saturation (Exit) 94 %   Rating of Perceived Exertion (Exercise) 16   Perceived Dyspnea (Exercise) 3   Duration Progress to 45 minutes of aerobic exercise without signs/symptoms of physical distress   Intensity THRR unchanged     Progression   Progression Continue to progress workloads to maintain intensity without signs/symptoms of physical distress.     Resistance Training   Training Prescription Yes   Weight orange bands   Reps 10-12  10 minutes of strength  training     Interval Training   Interval Training No     Oxygen   Oxygen Continuous   Liters 6     Track   Laps 5  performed another 6 minute walk test during class   Minutes 17     Goals Met:  Exercise tolerated well Strength training completed today  Goals Unmet:  Not Applicable  Comments: Service time is from 1330 to 1530    Dr. Rush Farmer is Medical Director for Pulmonary Rehab at [facility].

## 2016-02-27 ENCOUNTER — Encounter (HOSPITAL_COMMUNITY): Payer: BLUE CROSS/BLUE SHIELD

## 2016-03-01 ENCOUNTER — Encounter (HOSPITAL_COMMUNITY): Payer: BLUE CROSS/BLUE SHIELD

## 2016-03-02 ENCOUNTER — Encounter (HOSPITAL_COMMUNITY)
Admission: RE | Admit: 2016-03-02 | Discharge: 2016-03-02 | Disposition: A | Discharge status: Home / Self Care | Payer: BLUE CROSS/BLUE SHIELD | Source: Ambulatory Visit | Attending: Internal Medicine | Admitting: Internal Medicine

## 2016-03-02 VITALS — Wt 121.0 lb

## 2016-03-02 DIAGNOSIS — I272 Pulmonary hypertension, unspecified: Secondary | ICD-10-CM

## 2016-03-02 DIAGNOSIS — Z5189 Encounter for other specified aftercare: Secondary | ICD-10-CM | POA: Diagnosis not present

## 2016-03-02 NOTE — Progress Notes (Signed)
Daily Session Note  Patient Details  Name: Deborah [name] MRN: [id number] Date of Birth: Jul 24, 1950 Referring Provider:   [name] Pulmonary Rehab Walk Test from 02/12/2016 in [place]  Referring Provider  Dr. Nelda Marseille      Encounter Date: 03/02/2016  Check In:     Session Check In - 03/02/16 1330      Check-In   Location [facility]   Staff Present Rosebud Poles, RN, BSN;Molly diVincenzo, MS, ACSM RCEP, Exercise Physiologist;Lene Mckay Ysidro Evert, RN;Portia Rollene Rotunda, RN, BSN   Supervising physician immediately available to respond to emergencies [facility] immediately available   Physician(s) Dr. Tana Coast   Medication changes reported     No   Fall or balance concerns reported    No   Warm-up and Cool-down Performed as group-led instruction   Resistance Training Performed Yes   VAD Patient? No     Pain Assessment   Currently in Pain? No/denies   Multiple Pain Sites No      Capillary Blood Glucose: No results found for this or any previous visit (from the past 24 hour(s)).      Exercise Prescription Changes - 03/02/16 1500      Response to Exercise   Blood Pressure (Admit) 92/50   Blood Pressure (Exercise) 126/60   Blood Pressure (Exit) 106/60   Heart Rate (Admit) 119 bpm   Heart Rate (Exercise) 132 bpm   Heart Rate (Exit) 114 bpm   Oxygen Saturation (Admit) 93 %   Oxygen Saturation (Exercise) 89 %   Oxygen Saturation (Exit) 97 %   Rating of Perceived Exertion (Exercise) 13   Perceived Dyspnea (Exercise) 1   Duration Progress to 45 minutes of aerobic exercise without signs/symptoms of physical distress   Intensity THRR unchanged     Progression   Progression Continue to progress workloads to maintain intensity without signs/symptoms of physical distress.     Resistance Training   Training Prescription Yes   Weight orange bands   Reps 10-12  10 minutes of strength training     Interval Training   Interval  Training No     Oxygen   Oxygen Continuous   Liters 6     Recumbant Bike   Level 1   Minutes 17     NuStep   Level 1   Minutes 17     Track   Laps 8   Minutes 17     Goals Met:  Exercise tolerated well No report of cardiac concerns or symptoms Strength training completed today  Goals Unmet:  Not Applicable  Comments: Service time is from 1330 to 1500    Dr. Rush Farmer is Medical Director for Pulmonary Rehab at [facility].

## 2016-03-02 NOTE — Progress Notes (Signed)
Pt completed Quality of Life survey as a participant in Pulmonary Rehab. Scores 21.0 or below are considered low. Pt score very low in several areas Overall 13.05, Health and Function 9.17, physiological and spiritual 14.57, family 10.50.  Deborah [name] has pulmonary hypertension and her health has steadily been decreasing approximately over the last 10 years.  She has had a recent hospitalization which she is trying to regain her strength by enrolling in undergraduate pulmonary rehab.  She is hoping when she regains her strength back that her quality of life will improve.

## 2016-03-03 ENCOUNTER — Encounter (HOSPITAL_COMMUNITY): Payer: BLUE CROSS/BLUE SHIELD

## 2016-03-04 ENCOUNTER — Encounter (HOSPITAL_COMMUNITY)
Admission: RE | Admit: 2016-03-04 | Discharge: 2016-03-04 | Disposition: A | Discharge status: Home / Self Care | Payer: BLUE CROSS/BLUE SHIELD | Source: Ambulatory Visit | Attending: Internal Medicine | Admitting: Internal Medicine

## 2016-03-04 VITALS — Wt 121.9 lb

## 2016-03-04 DIAGNOSIS — Z5189 Encounter for other specified aftercare: Secondary | ICD-10-CM | POA: Diagnosis not present

## 2016-03-04 DIAGNOSIS — I272 Pulmonary hypertension, unspecified: Secondary | ICD-10-CM

## 2016-03-04 NOTE — Progress Notes (Signed)
Daily Session Note  Patient Details  Name: Deborah [name] MRN: [id number] Date of Birth: [birth year] Referring Provider:   [name] Pulmonary Rehab Walk Test from 02/12/2016 in [place]  Referring Provider  Dr. Nelda Marseille      Encounter Date: 03/04/2016  Check In:     Session Check In - 03/04/16 1342      Check-In   Location [facility]   Staff Present Rosebud Poles, RN, BSN;Jnae Thomaston, MS, ACSM RCEP, Exercise Physiologist;Lisa Ysidro Evert, RN;Portia Rollene Rotunda, RN, BSN   Supervising physician immediately available to respond to emergencies [facility] immediately available   Physician(s) Dr. Eliseo Squires   Medication changes reported     No   Fall or balance concerns reported    No   Warm-up and Cool-down Performed as group-led instruction   Resistance Training Performed Yes   VAD Patient? No     Pain Assessment   Currently in Pain? No/denies   Multiple Pain Sites No      Capillary Blood Glucose: No results found for this or any previous visit (from the past 24 hour(s)).      Exercise Prescription Changes - 03/04/16 1600      Response to Exercise   Blood Pressure (Admit) 110/40   Blood Pressure (Exit) 120/64   Heart Rate (Admit) 118 bpm   Heart Rate (Exercise) 122 bpm   Heart Rate (Exit) 111 bpm   Oxygen Saturation (Admit) 90 %   Oxygen Saturation (Exercise) 96 %   Oxygen Saturation (Exit) 100 %   Rating of Perceived Exertion (Exercise) 13   Perceived Dyspnea (Exercise) 2   Duration Progress to 45 minutes of aerobic exercise without signs/symptoms of physical distress   Intensity THRR unchanged     Progression   Progression Continue to progress workloads to maintain intensity without signs/symptoms of physical distress.     Resistance Training   Training Prescription Yes   Weight orange bands   Reps 10-12  10 minutes of strength training     Interval Training   Interval Training No     Oxygen   Oxygen  Continuous   Liters 6     Recumbant Bike   Level 1   Minutes 17     Track   Laps 7   Minutes 17     Goals Met:  Exercise tolerated well No report of cardiac concerns or symptoms Strength training completed today  Goals Unmet:  Not Applicable  Comments: Service time is from 1:30p to 3:40p    Dr. Rush Farmer is Medical Director for Pulmonary Rehab at [facility]

## 2016-03-05 ENCOUNTER — Encounter (HOSPITAL_COMMUNITY): Payer: BLUE CROSS/BLUE SHIELD

## 2016-03-08 ENCOUNTER — Encounter (HOSPITAL_COMMUNITY): Payer: BLUE CROSS/BLUE SHIELD

## 2016-03-09 ENCOUNTER — Encounter (HOSPITAL_COMMUNITY)
Admission: RE | Admit: 2016-03-09 | Discharge: 2016-03-09 | Disposition: A | Discharge status: Home / Self Care | Payer: BLUE CROSS/BLUE SHIELD | Source: Ambulatory Visit | Attending: Internal Medicine | Admitting: Internal Medicine

## 2016-03-09 VITALS — Wt 121.7 lb

## 2016-03-09 DIAGNOSIS — I272 Pulmonary hypertension, unspecified: Secondary | ICD-10-CM

## 2016-03-09 DIAGNOSIS — Z5189 Encounter for other specified aftercare: Secondary | ICD-10-CM | POA: Diagnosis not present

## 2016-03-09 NOTE — Progress Notes (Signed)
Daily Session Note  Patient Details  Name: Deborah [name] MRN: [id number] Date of Birth: 1950-12-12 Referring Provider:   [name] Pulmonary Rehab Walk Test from 02/12/2016 in [place]  Referring Provider  Dr. Nelda Marseille      Encounter Date: 03/09/2016  Check In:     Session Check In - 03/09/16 1340      Check-In   Location [facility]   Staff Present Rosebud Poles, RN, BSN;Molly diVincenzo, MS, ACSM RCEP, Exercise Physiologist;Lisa Ysidro Evert, RN;Rohnan Bartleson Rollene Rotunda, RN, BSN   Supervising physician immediately available to respond to emergencies [facility] immediately available   Physician(s) Dr. Eliseo Squires   Medication changes reported     No   Fall or balance concerns reported    No   Warm-up and Cool-down Performed as group-led instruction   Resistance Training Performed Yes   VAD Patient? No     Pain Assessment   Currently in Pain? No/denies   Multiple Pain Sites No      Capillary Blood Glucose: No results found for this or any previous visit (from the past 24 hour(s)).      Exercise Prescription Changes - 03/09/16 1515      Response to Exercise   Blood Pressure (Admit) 100/40   Blood Pressure (Exercise) 100/40   Blood Pressure (Exit) 126/72   Heart Rate (Admit) 119 bpm   Heart Rate (Exercise) 130 bpm   Heart Rate (Exit) 109 bpm   Oxygen Saturation (Admit) 95 %   Oxygen Saturation (Exercise) 91 %   Oxygen Saturation (Exit) 98 %   Rating of Perceived Exertion (Exercise) 13   Perceived Dyspnea (Exercise) 2   Duration Progress to 45 minutes of aerobic exercise without signs/symptoms of physical distress   Intensity THRR unchanged     Progression   Progression Continue to progress workloads to maintain intensity without signs/symptoms of physical distress.     Resistance Training   Training Prescription Yes   Weight orange bands   Reps 10-12  10 minutes of strength training     Interval Training   Interval  Training No     Oxygen   Oxygen Continuous   Liters 6     Recumbant Bike   Level 1   Minutes 17     NuStep   Level 1   Minutes 17     Track   Laps 7   Minutes 17     Goals Met:  Using PLB without cueing & demonstrates good technique Exercise tolerated well No report of cardiac concerns or symptoms Strength training completed today  Goals Unmet:  Not Applicable  Comments: Service time is from 1330 to 1500   Dr. Rush Farmer is Medical Director for Pulmonary Rehab at [facility].

## 2016-03-10 ENCOUNTER — Encounter (HOSPITAL_COMMUNITY): Payer: BLUE CROSS/BLUE SHIELD

## 2016-03-11 ENCOUNTER — Encounter (HOSPITAL_COMMUNITY): Admission: RE | Admit: 2016-03-11 | Payer: BLUE CROSS/BLUE SHIELD | Source: Ambulatory Visit

## 2016-03-12 ENCOUNTER — Encounter (HOSPITAL_COMMUNITY): Payer: BLUE CROSS/BLUE SHIELD

## 2016-03-15 ENCOUNTER — Encounter (HOSPITAL_COMMUNITY): Payer: BLUE CROSS/BLUE SHIELD

## 2016-03-16 ENCOUNTER — Encounter (HOSPITAL_COMMUNITY)
Admission: RE | Admit: 2016-03-16 | Discharge: 2016-03-16 | Disposition: A | Discharge status: Home / Self Care | Payer: BLUE CROSS/BLUE SHIELD | Source: Ambulatory Visit | Attending: Internal Medicine | Admitting: Internal Medicine

## 2016-03-16 VITALS — Wt 121.9 lb

## 2016-03-16 DIAGNOSIS — I272 Pulmonary hypertension, unspecified: Secondary | ICD-10-CM

## 2016-03-16 DIAGNOSIS — Z5189 Encounter for other specified aftercare: Secondary | ICD-10-CM | POA: Diagnosis not present

## 2016-03-16 NOTE — Progress Notes (Signed)
Pulmonary Individual Treatment Plan  Patient Details  Name: Deborah [name] MRN: [id number] Date of Birth: 09/30/1950 Referring Provider:   [name] Pulmonary Rehab Walk Test from 02/12/2016 in [place]  Referring Provider  Dr. Nelda Marseille      Initial Encounter Date:  Flowsheet Row Pulmonary Rehab Walk Test from 02/12/2016 in [place]  Date  02/13/16  Referring Provider  Dr. Nelda Marseille      Visit Diagnosis: Pulmonary hypertension  Patient's Home Medications on Admission:   Current Outpatient Prescriptions:  .  albuterol (PROVENTIL HFA;VENTOLIN HFA) 108 (90 BASE) MCG/ACT inhaler, Inhale 2 puffs into the lungs every 6 (six) hours as needed. For shortness of breath., Disp: 1 Inhaler, Rfl: 3 .  calcium-vitamin D (OSCAL WITH D) 500-200 MG-UNIT per tablet, Take 1 tablet by mouth daily., Disp: , Rfl:  .  cholecalciferol (VITAMIN D) 1000 units tablet, Take 2,000 Units by mouth 2 (two) times a week., Disp: , Rfl:  .  cholestyramine light (PREVALITE) 4 GM/DOSE powder, Take 1-2 scoopfulls at bedtime as needed, Disp: 239.4 g, Rfl: 1 .  denosumab (PROLIA) 60 MG/ML SOLN injection, Inject 60 mg into the skin every 6 (six) months. Administer in upper arm, thigh, or abdomen, Disp: , Rfl:  .  DULoxetine (CYMBALTA) 60 MG capsule, Take 60 mg by mouth every evening. , Disp: , Rfl:  .  furosemide (LASIX) 40 MG tablet, Take 40 mg by mouth daily. , Disp: , Rfl:  .  gabapentin (NEURONTIN) 400 MG capsule, Take 400 mg by mouth 4 (four) times daily., Disp: , Rfl:  .  guaiFENesin (MUCINEX) 600 MG 12 hr tablet, Take 600 mg by mouth daily. , Disp: , Rfl:  .  lidocaine (LIDODERM) 5 %, Place 1 patch onto the skin daily. Remove & Discard patch within 12 hours or as directed by MD, Disp: , Rfl:  .  mometasone-formoterol (DULERA) 100-5 MCG/ACT AERO, Inhale 2 puffs into the lungs 2 (two) times daily., Disp: 1 Inhaler, Rfl: 3 .  Multiple Vitamin  (MULTIVITAMIN WITH MINERALS) TABS, Take 1 tablet by mouth daily., Disp: , Rfl:  .  mycophenolate (CELLCEPT) 500 MG tablet, Take 500 mg by mouth as directed. , Disp: , Rfl:  .  oxycodone (OXY-IR) 5 MG capsule, Take 5 mg by mouth every 6 (six) hours as needed., Disp: , Rfl:  .  OXYGEN, Inhale into the lungs. Continuous, 4 L/min when exercising and 3 L/min when sitting, Disp: , Rfl:  .  pantoprazole (PROTONIX) 40 MG tablet, Take by mouth., Disp: , Rfl:  .  Polyethyl Glycol-Propyl Glycol 0.4-0.3 % SOLN, Apply to eye., Disp: , Rfl:  .  potassium chloride SA (K-DUR,KLOR-CON) 20 MEQ tablet, Take by mouth., Disp: , Rfl:  .  predniSONE (DELTASONE) 10 MG tablet, Take by mouth., Disp: , Rfl:  .  saccharomyces boulardii (FLORASTOR) 250 MG capsule, Take 1 capsule (250 mg total) by mouth 2 (two) times daily., Disp: 14 capsule, Rfl: 0 .  Vitamin D, Ergocalciferol, (DRISDOL) 50000 UNITS CAPS, Take 50,000 Units by mouth every 7 (seven) days. , Disp: , Rfl:   Past Medical History: Past Medical History:  Diagnosis Date  . Anemia   . C. difficile colitis   . CAD (coronary artery disease)   . Cataract   . CHF (congestive heart failure) (HCC)    right sided heart failure from pulmonary hypertension  . COPD (chronic obstructive pulmonary disease) ([place])   . Gallstones   .  Hearing loss    right  . Hyperactive airway disease   . Hyperlipidemia   . Hypertension   . Lung cancer ([place]) carcinoid tumor  2004  . Osteopenia   . Pneumonia   . Raynaud's disease   . Restrictive lung disease   . Shingles   . Tubulovillous adenoma of rectum 2007    Tobacco Use: History  Smoking Status  . Never Smoker  Smokeless Tobacco  . Never Used    Labs: Recent Review Flowsheet Data    Labs for ITP Cardiac and Pulmonary Rehab Latest Ref Rng & Units 12/07/2006 01/24/2008 02/03/2009 02/25/2010 03/01/2011   Cholestrol 0 - 200 mg/dL 248(HH) 251(HH) 238(H) 247(H) 211(H)   LDLDIRECT mg/dL 167.0 173.7 165.7 172.0 143.1   HDL  >39.00 mg/dL 41.9 49.3 47.70 53.50 44.00   Trlycerides 0.0 - 149.0 mg/dL 207(HH) 165(H) 140.0 137.0 105.0      Capillary Blood Glucose: No results found for: GLUCAP   ADL UCSD:     Pulmonary Assessment Scores    Row Name 02/25/16 1121         ADL UCSD   ADL Phase Entry     SOB Score total 67        Pulmonary Function Assessment:     Pulmonary Function Assessment - 02/06/16 1502      Breath   Bilateral Breath Sounds Other   Other fine crackles bases bilat right greater than left   Shortness of Breath Fear of Shortness of Breath;Limiting activity      Exercise Target Goals:    Exercise Program Goal: Individual exercise prescription set with THRR, safety & activity barriers. Participant demonstrates ability to understand and report RPE using BORG scale, to self-measure pulse accurately, and to acknowledge the importance of the exercise prescription.  Exercise Prescription Goal: Starting with aerobic activity 30 plus minutes a day, 3 days per week for initial exercise prescription. Provide home exercise prescription and guidelines that participant acknowledges understanding prior to discharge.  Activity Barriers & Risk Stratification:     Activity Barriers & Cardiac Risk Stratification - 02/06/16 1500      Activity Barriers & Cardiac Risk Stratification   Activity Barriers Deconditioning;Muscular Weakness;Shortness of Breath      6 Minute Walk:     6 Minute Walk    Row Name 02/13/16 0744 02/26/16 1605       6 Minute Walk   Phase Initial Mid Program  for doctors office concerning o2 needs    Distance 552 feet 675 feet    Walk Time -  5 minutes and 45 seconds 6 minutes    # of Rest Breaks 1  15 seconds 0    MPH 1.04 1.27    METS 1.77 1.92    RPE 14 16    Perceived Dyspnea  4 3    Symptoms No No    Resting HR 124 bpm 124 bpm    Resting BP 140/70 150/66    Max Ex. HR 142 bpm 139 bpm    Max Ex. BP 160/60 210/80    2 Minute Post BP 124/64 150/50       Interval HR   Baseline HR 124 124    1 Minute HR 124 128    2 Minute HR 128 130    3 Minute HR 132 132    4 Minute HR 135 139    5 Minute HR 139 139    6 Minute HR 142 136    2  Minute Post HR 122 128    Interval Heart Rate? Yes Yes      Interval Oxygen   Interval Oxygen? Yes Yes    Baseline Oxygen Saturation % 94 % 94 %    Baseline Liters of Oxygen 4 L 4 L    1 Minute Oxygen Saturation % 92 % 92 %    1 Minute Liters of Oxygen 4 L 4 L    2 Minute Oxygen Saturation % 89 % 90 %    2 Minute Liters of Oxygen 4 L 4 L    3 Minute Oxygen Saturation % 89 % 89 %    3 Minute Liters of Oxygen 4 L 4 L    4 Minute Oxygen Saturation % 88 % 88 %    4 Minute Liters of Oxygen 4 L 4 L    5 Minute Oxygen Saturation % 87 % 90 %    5 Minute Liters of Oxygen 4 L 6 L    6 Minute Oxygen Saturation % 89 % 91 %    6 Minute Liters of Oxygen 4 L 6 L    2 Minute Post Oxygen Saturation % 100 % 94 %    2 Minute Post Liters of Oxygen 4 L 6 L       Initial Exercise Prescription:     Initial Exercise Prescription - 02/13/16 0700      Date of Initial Exercise RX and Referring Provider   Date 02/13/16   Referring Provider Dr. Nelda Marseille     Oxygen   Oxygen Continuous   Liters 4     Bike   Level 0.3   Minutes 17     NuStep   Level 1   Minutes 17   METs 1.5     Track   Laps 5   Minutes 17     Prescription Details   Frequency (times per week) 2   Duration Progress to 45 minutes of aerobic exercise without signs/symptoms of physical distress     Intensity   THRR 40-80% of Max Heartrate 63-126   Ratings of Perceived Exertion 11-13   Perceived Dyspnea 0-4     Progression   Progression Continue progressive overload as per policy without signs/symptoms or physical distress.     Resistance Training   Training Prescription Yes   Weight orange bands   Reps 10-12      Perform Capillary Blood Glucose checks as needed.  Exercise Prescription Changes:     Exercise Prescription Changes     Row Name 02/19/16 1500 02/24/16 1500 02/26/16 1600 03/02/16 1500 03/04/16 1600     Response to Exercise   Blood Pressure (Admit) 140/60 102/60 150/66 92/50 110/40   Blood Pressure (Exercise) 132/60 160/80 210/80  she will add her lasix back in her medicines,  126/60  -   Blood Pressure (Exit) 142/70 126/62 150/50 106/60 120/64   Heart Rate (Admit) 121 bpm 120 bpm 124 bpm 119 bpm 118 bpm   Heart Rate (Exercise) 122 bpm 130 bpm 139 bpm 132 bpm 122 bpm   Heart Rate (Exit) 116 bpm 115 bpm 128 bpm 114 bpm 111 bpm   Oxygen Saturation (Admit) 92 % 99 % 94 % 93 % 90 %   Oxygen Saturation (Exercise) 9592 % 89 % 88 % 89 % 96 %   Oxygen Saturation (Exit) 96 % 96 % 94 % 97 % 100 %   Rating of Perceived Exertion (Exercise)  - _0 13  Perceived Dyspnea (Exercise)  - _0 Duration Progress to 45 minutes of aerobic exercise without signs/symptoms of physical distress Progress to 45 minutes of aerobic exercise without signs/symptoms of physical distress Progress to 45 minutes of aerobic exercise without signs/symptoms of physical distress Progress to 45 minutes of aerobic exercise without signs/symptoms of physical distress Progress to 45 minutes of aerobic exercise without signs/symptoms of physical distress   Intensity -  40-80% HRR THRR unchanged THRR unchanged THRR unchanged THRR unchanged     Progression   Progression Continue to progress workloads to maintain intensity without signs/symptoms of physical distress. Continue to progress workloads to maintain intensity without signs/symptoms of physical distress. Continue to progress workloads to maintain intensity without signs/symptoms of physical distress. Continue to progress workloads to maintain intensity without signs/symptoms of physical distress. Continue to progress workloads to maintain intensity without signs/symptoms of physical distress.     Resistance Training   Training Prescription _1    Weight orange bands  orange bands orange bands orange bands orange bands   Reps 10-12  10 minutes of strength training 10-12  10 minutes of strength training 10-12  10 minutes of strength training 10-12  10 minutes of strength training 10-12  10 minutes of strength training     Interval Training   Interval Training _2      Oxygen   Oxygen  - Continuous Continuous Continuous Continuous   Liters  - _3 Recumbant Bike   Level 1 1  - 1 1   Minutes 17 17  - 17 17     NuStep   Level 1 1  - 1  -   Minutes 17 17  - 17  -   METs  - 1.4  -  -  -     Track   Laps  - 5 5  performed another 6 minute walk test during class 8 7   Minutes  - _4 Row Name 03/09/16 1515             Response to Exercise   Blood Pressure (Admit) 100/40       Blood Pressure (Exercise) 100/40       Blood Pressure (Exit) 126/72       Heart Rate (Admit) 119 bpm       Heart Rate (Exercise) 130 bpm       Heart Rate (Exit) 109 bpm       Oxygen Saturation (Admit) 95 %       Oxygen Saturation (Exercise) 91 %       Oxygen Saturation (Exit) 98 %       Rating of Perceived Exertion (Exercise) 13       Perceived Dyspnea (Exercise) 2       Duration Progress to 45 minutes of aerobic exercise without signs/symptoms of physical distress       Intensity THRR unchanged         Progression   Progression Continue to progress workloads to maintain intensity without signs/symptoms of physical distress.         Resistance Training   Training Prescription Yes       Weight orange bands       Reps 10-12  10 minutes of strength training         Interval Training   Interval Training No  Oxygen   Oxygen Continuous       Liters 6         Recumbant Bike   Level 1       Minutes 17         NuStep   Level 1       Minutes 17         Track   Laps 7       Minutes 17          Exercise Comments:     Exercise Comments    Row Name 03/16/16 [id number]           Exercise Comments Patient has only  attended 6 exercise sessions. She is up to 8 laps on the track and her MET level places her at a low level. Will cont. to motivate and encourage patient to increase her intensities.           Discharge Exercise Prescription (Final Exercise Prescription Changes):     Exercise Prescription Changes - 03/09/16 1515      Response to Exercise   Blood Pressure (Admit) 100/40   Blood Pressure (Exercise) 100/40   Blood Pressure (Exit) 126/72   Heart Rate (Admit) 119 bpm   Heart Rate (Exercise) 130 bpm   Heart Rate (Exit) 109 bpm   Oxygen Saturation (Admit) 95 %   Oxygen Saturation (Exercise) 91 %   Oxygen Saturation (Exit) 98 %   Rating of Perceived Exertion (Exercise) 13   Perceived Dyspnea (Exercise) 2   Duration Progress to 45 minutes of aerobic exercise without signs/symptoms of physical distress   Intensity THRR unchanged     Progression   Progression Continue to progress workloads to maintain intensity without signs/symptoms of physical distress.     Resistance Training   Training Prescription Yes   Weight orange bands   Reps 10-12  10 minutes of strength training     Interval Training   Interval Training No     Oxygen   Oxygen Continuous   Liters 6     Recumbant Bike   Level 1   Minutes 17     NuStep   Level 1   Minutes 17     Track   Laps 7   Minutes 17       Nutrition:  Target Goals: Understanding of nutrition guidelines, daily intake of sodium <1559m, cholesterol <2025m calories 30% from fat and 7% or less from saturated fats, daily to have 5 or more servings of fruits and vegetables.  Biometrics:     Pre Biometrics - 02/19/16 1554      Pre Biometrics   Grip Strength 22 kg       Nutrition Therapy Plan and Nutrition Goals:   Nutrition Discharge: Rate Your Plate Scores:   Psychosocial: Target Goals: Acknowledge presence or absence of depression, maximize coping skills, provide positive support system. Participant is able to verbalize types  and ability to use techniques and skills needed for reducing stress and depression.  Initial Review & Psychosocial Screening:     Initial Psych Review & Screening - 02/06/16 15CenterYes     Barriers   Psychosocial barriers to participate in program The patient should benefit from training in stress management and relaxation.     Screening Interventions   Interventions Encouraged to exercise      Quality of Life Scores:     Quality of Life - 02/25/16  1121      Quality of Life Scores   Health/Function Pre 9.17 %   Socioeconomic Pre 20.93 %   Psych/Spiritual Pre 14.57 %   Family Pre 10.5 %   GLOBAL Pre 13.05 %      PHQ-9: Recent Review Flowsheet Data    Depression screen [facility] 2/9 02/06/2016 07/09/2014 01/21/2014   Decreased Interest 1 1 0   Down, Depressed, Hopeless 0 0 0   PHQ - 2 Score 1 1 0      Psychosocial Evaluation and Intervention:     Psychosocial Evaluation - 02/06/16 1504      Psychosocial Evaluation & Interventions   Interventions Encouraged to exercise with the program and follow exercise prescription      Psychosocial Re-Evaluation:     Psychosocial Re-Evaluation    [place] Name 02/19/16 1633 03/15/16 1458           Psychosocial Re-Evaluation   Interventions Encouraged to attend Pulmonary Rehabilitation for the exercise Encouraged to attend Pulmonary Rehabilitation for the exercise      Comments no psychosocial issue identified no psychosocial issues identified      Continued Psychosocial Services Needed No No        Education: Education Goals: Education classes will be provided on a weekly basis, covering required topics. Participant will state understanding/return demonstration of topics presented.  Learning Barriers/Preferences:     Learning Barriers/Preferences - 02/06/16 1500      Learning Barriers/Preferences   Learning Barriers None   Learning Preferences Computer/Internet;Group  Instruction;Verbal Instruction;Written Material      Education Topics: Risk Factor Reduction:  -Group instruction that is supported by a PowerPoint presentation. Instructor discusses the definition of a risk factor, different risk factors for pulmonary disease, and how the heart and lungs work together.     Nutrition for Pulmonary Patient:  -Group instruction provided by PowerPoint slides, verbal discussion, and written materials to support subject matter. The instructor gives an explanation and review of healthy diet recommendations, which includes a discussion on weight management, recommendations for fruit and vegetable consumption, as well as protein, fluid, caffeine, fiber, sodium, sugar, and alcohol. Tips for eating when patients are short of breath are discussed. Flowsheet Row PULMONARY REHAB OTHER RESPIRATORY from 03/04/2016 in [place]  Date  03/04/16  Educator  [place]  Instruction Review Code  2- meets goals/outcomes      Pursed Lip Breathing:  -Group instruction that is supported by demonstration and informational handouts. Instructor discusses the benefits of pursed lip and diaphragmatic breathing and detailed demonstration on how to preform both.     Oxygen Safety:  -Group instruction provided by PowerPoint, verbal discussion, and written material to support subject matter. There is an overview of "What is Oxygen" and "Why do we need it".  Instructor also reviews how to create a safe environment for oxygen use, the importance of using oxygen as prescribed, and the risks of noncompliance. There is a brief discussion on traveling with oxygen and resources the patient may utilize.   Oxygen Equipment:  -Group instruction provided by [facility] Staff utilizing handouts, written materials, and equipment demonstrations. Flowsheet Row PULMONARY REHAB OTHER RESPIRATORY from 03/04/2016 in [place]  Date  02/26/16   Educator  [employer]  Instruction Review Code  2- meets goals/outcomes      Signs and Symptoms:  -Group instruction provided by written material and verbal discussion to support subject matter. Warning signs and symptoms of infection, stroke,  and heart attack are reviewed and when to call the physician/911 reinforced. Tips for preventing the spread of infection discussed.   Advanced Directives:  -Group instruction provided by verbal instruction and written material to support subject matter. Instructor reviews Advanced Directive laws and proper instruction for filling out document.   Pulmonary Video:  -Group video education that reviews the importance of medication and oxygen compliance, exercise, good nutrition, pulmonary hygiene, and pursed lip and diaphragmatic breathing for the pulmonary patient. Flowsheet Row PULMONARY REHAB OTHER RESPIRATORY from 03/04/2016 in [place]  Date  02/19/16  Educator  video  Instruction Review Code  2- meets goals/outcomes      Exercise for the Pulmonary Patient:  -Group instruction that is supported by a PowerPoint presentation. Instructor discusses benefits of exercise, core components of exercise, frequency, duration, and intensity of an exercise routine, importance of utilizing pulse oximetry during exercise, safety while exercising, and options of places to exercise outside of rehab.     Pulmonary Medications:  -Verbally interactive group education provided by instructor with focus on inhaled medications and proper administration.   Anatomy and Physiology of the Respiratory System and Intimacy:  -Group instruction provided by PowerPoint, verbal discussion, and written material to support subject matter. Instructor reviews respiratory cycle and anatomical components of the respiratory system and their functions. Instructor also reviews differences in obstructive and restrictive respiratory diseases with examples of  each. Intimacy, Sex, and Sexuality differences are reviewed with a discussion on how relationships can change when diagnosed with pulmonary disease. Common sexual concerns are reviewed.   Knowledge Questionnaire Score:     Knowledge Questionnaire Score - 02/25/16 1120      Knowledge Questionnaire Score   Pre Score 13/13      Core Components/Risk Factors/Patient Goals at Admission:     Personal Goals and Risk Factors at Admission - 02/19/16 1631      Core Components/Risk Factors/Patient Goals on Admission    Weight Management Weight Maintenance      Core Components/Risk Factors/Patient Goals Review:      Goals and Risk Factor Review    Row Name 02/19/16 1632 03/15/16 1456           Core Components/Risk Factors/Patient Goals Review   Personal Goals Review Increase Strength and Stamina;Improve shortness of breath with ADL's;Weight Management/Obesity Increase Strength and Stamina;Improve shortness of breath with ADL's;Weight Management/Obesity  weight gain, not weight loss      Review Just started program today, work on weight management, too early to see any progression. 6 sessions completed, looks much better than at start of program, adjusting well      Expected Outcomes  - continue to progress with workload increases         Core Components/Risk Factors/Patient Goals at Discharge (Final Review):      Goals and Risk Factor Review - 03/15/16 1456      Core Components/Risk Factors/Patient Goals Review   Personal Goals Review Increase Strength and Stamina;Improve shortness of breath with ADL's;Weight Management/Obesity  weight gain, not weight loss   Review 6 sessions completed, looks much better than at start of program, adjusting well   Expected Outcomes continue to progress with workload increases      ITP Comments:   Comments: ITP REVIEW Pt is making expected progress toward pulmonary rehab goals after completing 6 sessions. Recommend continued exercise, life  style modification, education, and utilization of breathing techniques to increase stamina and strength and decrease shortness of breath with  exertion.

## 2016-03-16 NOTE — Progress Notes (Signed)
Daily Session Note  Patient Details  Name: Deborah [name] MRN: [id number] Date of Birth: 1951/02/07 Referring Provider:   [name] Pulmonary Rehab Walk Test from 02/12/2016 in [place]  Referring Provider  Dr. Nelda Marseille      Encounter Date: 03/16/2016  Check In:     Session Check In - 03/16/16 1522      Check-In   Location [facility]   Staff Present Su Hilt, MS, ACSM RCEP, Exercise Physiologist;Portia Rollene Rotunda, RN, Maxcine Ham, RN, Roque Cash, RN   Supervising physician immediately available to respond to emergencies [facility] immediately available   Physician(s) Dr. Wyline Copas   Medication changes reported     No   Fall or balance concerns reported    No   Warm-up and Cool-down Performed as group-led instruction   Resistance Training Performed Yes   VAD Patient? No     Pain Assessment   Currently in Pain? No/denies   Multiple Pain Sites No      Capillary Blood Glucose: No results found for this or any previous visit (from the past 24 hour(s)).      Exercise Prescription Changes - 03/16/16 1500      Response to Exercise   Blood Pressure (Admit) 100/50   Blood Pressure (Exercise) 110/50   Blood Pressure (Exit) 122/70   Heart Rate (Admit) 116 bpm   Heart Rate (Exercise) 124 bpm   Heart Rate (Exit) 110 bpm   Oxygen Saturation (Admit) 99 %   Oxygen Saturation (Exercise) 93 %   Oxygen Saturation (Exit) 99 %   Rating of Perceived Exertion (Exercise) 13   Perceived Dyspnea (Exercise) 2   Duration Progress to 45 minutes of aerobic exercise without signs/symptoms of physical distress   Intensity THRR unchanged     Progression   Progression Continue to progress workloads to maintain intensity without signs/symptoms of physical distress.     Resistance Training   Training Prescription Yes   Weight orange bands   Reps 10-12  10 minutes of strength training     Interval Training   Interval  Training No     Oxygen   Oxygen Continuous   Liters 6     Recumbant Bike   Level 1   Minutes 17     NuStep   Level 1   Minutes 17     Track   Laps 8   Minutes 17     Goals Met:  Exercise tolerated well No report of cardiac concerns or symptoms Strength training completed today  Goals Unmet:  Not Applicable  Comments: Service time is from 1330 to 1505    Dr. Rush Farmer is Medical Director for Pulmonary Rehab at [facility].

## 2016-03-17 ENCOUNTER — Encounter (HOSPITAL_COMMUNITY): Payer: BLUE CROSS/BLUE SHIELD

## 2016-03-18 ENCOUNTER — Encounter (HOSPITAL_COMMUNITY)
Admission: RE | Admit: 2016-03-18 | Discharge: 2016-03-18 | Disposition: A | Discharge status: Home / Self Care | Payer: BLUE CROSS/BLUE SHIELD | Source: Ambulatory Visit | Attending: Internal Medicine | Admitting: Internal Medicine

## 2016-03-18 VITALS — Wt 121.5 lb

## 2016-03-18 DIAGNOSIS — Z5189 Encounter for other specified aftercare: Secondary | ICD-10-CM | POA: Diagnosis not present

## 2016-03-18 DIAGNOSIS — I272 Pulmonary hypertension, unspecified: Secondary | ICD-10-CM

## 2016-03-18 NOTE — Progress Notes (Signed)
Daily Session Note  Patient Details  Name: Deborah [name] MRN: [id number] Date of Birth: 16-Jun-1950 Referring Provider:   [name] [facility] Walk Test from 02/12/2016 in [place]  Referring Provider  Dr. Nelda Marseille      Encounter Date: 03/18/2016  Check In:     Session Check In - 03/18/16 1343      Check-In   Location [facility]   Staff Present Su Hilt, MS, ACSM RCEP, Exercise Physiologist;Jule Schlabach Ysidro Evert, RN;Portia Rollene Rotunda, RN, BSN   Supervising physician immediately available to respond to emergencies [facility] immediately available   Physician(s) Dr. Algis Liming   Medication changes reported     No   Fall or balance concerns reported    No   Warm-up and Cool-down Performed as group-led instruction   Resistance Training Performed Yes   VAD Patient? No     Pain Assessment   Currently in Pain? No/denies   Multiple Pain Sites No      Capillary Blood Glucose: No results found for this or any previous visit (from the past 24 hour(s)).      Exercise Prescription Changes - 03/18/16 1600      Exercise Review   Progression Yes     Response to Exercise   Blood Pressure (Admit) 98/60   Blood Pressure (Exercise) 134/62   Blood Pressure (Exit) 110/62   Heart Rate (Admit) 115 bpm   Heart Rate (Exercise) 116 bpm   Heart Rate (Exit) 100 bpm   Oxygen Saturation (Admit) 95 %   Oxygen Saturation (Exercise) 90 %   Oxygen Saturation (Exit) 93 %   Rating of Perceived Exertion (Exercise) 13   Perceived Dyspnea (Exercise) 1   Duration Progress to 45 minutes of aerobic exercise without signs/symptoms of physical distress   Intensity THRR unchanged     Progression   Progression Continue to progress workloads to maintain intensity without signs/symptoms of physical distress.     Resistance Training   Training Prescription Yes   Weight orange bands   Reps 10-12  10 minutes of strength training     Interval  Training   Interval Training No     Oxygen   Oxygen Continuous   Liters 6     Recumbant Bike   Level 1   Minutes 17     NuStep   Level 2   Minutes 17     Goals Met:  Exercise tolerated well No report of cardiac concerns or symptoms Strength training completed today  Goals Unmet:  Not Applicable  Comments: Service time is from 1330 to 1515    Dr. Rush Farmer is Medical Director for Pulmonary Rehab at [facility].

## 2016-03-19 ENCOUNTER — Encounter (HOSPITAL_COMMUNITY): Payer: BLUE CROSS/BLUE SHIELD

## 2016-03-22 ENCOUNTER — Encounter (HOSPITAL_COMMUNITY): Payer: BLUE CROSS/BLUE SHIELD

## 2016-03-23 ENCOUNTER — Encounter (HOSPITAL_COMMUNITY)
Admission: RE | Admit: 2016-03-23 | Discharge: 2016-03-23 | Disposition: A | Discharge status: Home / Self Care | Payer: BLUE CROSS/BLUE SHIELD | Source: Ambulatory Visit | Attending: Internal Medicine | Admitting: Internal Medicine

## 2016-03-23 VITALS — Wt 123.2 lb

## 2016-03-23 DIAGNOSIS — Z5189 Encounter for other specified aftercare: Secondary | ICD-10-CM | POA: Diagnosis not present

## 2016-03-23 DIAGNOSIS — I272 Pulmonary hypertension, unspecified: Secondary | ICD-10-CM

## 2016-03-23 NOTE — Progress Notes (Signed)
Daily Session Note  Patient Details  Name: Deborah [name] MRN: [id number] Date of Birth: 25-Nov-1950 Referring Provider:   [name] [facility] Rehab Walk Test from 02/12/2016 in [place]  Referring Provider  Dr. Nelda Marseille      Encounter Date: 03/23/2016  Check In:     Session Check In - 03/23/16 1330      Check-In   Staff Present Rosebud Poles, RN, BSN;Lisa Ysidro Evert, RN;Molly diVincenzo, MS, ACSM RCEP, Exercise Physiologist   Supervising physician immediately available to respond to emergencies [facility] immediately available   Physician(s) Dr. Posey Pronto   Medication changes reported     No   Fall or balance concerns reported    No   Warm-up and Cool-down Performed as group-led instruction   Resistance Training Performed Yes   VAD Patient? No     Pain Assessment   Currently in Pain? No/denies   Multiple Pain Sites No      Capillary Blood Glucose: No results found for this or any previous visit (from the past 24 hour(s)).      Exercise Prescription Changes - 03/23/16 1514      Response to Exercise   Blood Pressure (Admit) 104/50   Blood Pressure (Exercise) 120/60   Blood Pressure (Exit) 112/62   Heart Rate (Admit) 120 bpm   Heart Rate (Exercise) 126 bpm   Heart Rate (Exit) 114 bpm   Oxygen Saturation (Admit) 94 %   Oxygen Saturation (Exercise) 94 %   Oxygen Saturation (Exit) 100 %   Rating of Perceived Exertion (Exercise) 13   Perceived Dyspnea (Exercise) 1   Duration Progress to 45 minutes of aerobic exercise without signs/symptoms of physical distress   Intensity THRR unchanged     Progression   Progression Continue to progress workloads to maintain intensity without signs/symptoms of physical distress.     Resistance Training   Training Prescription Yes   Weight orange bands   Reps 10-12  10 minutes of strength training     Interval Training   Interval Training No     Oxygen   Oxygen Continuous   Liters 6      Recumbant Bike   Level 1   Minutes 17     NuStep   Level 2   Minutes 17   METs 1.6     Track   Laps 5   Minutes 17     Goals Met:  Independence with exercise equipment Improved SOB with ADL's Using PLB without cueing & demonstrates good technique Exercise tolerated well No report of cardiac concerns or symptoms Strength training completed today  Goals Unmet:  Not Applicable  Comments: Service time is from 1330 to 1500   Dr. Rush Farmer is Medical Director for Pulmonary Rehab at [facility].

## 2016-03-24 ENCOUNTER — Encounter (HOSPITAL_COMMUNITY): Payer: BLUE CROSS/BLUE SHIELD

## 2016-03-25 ENCOUNTER — Encounter (HOSPITAL_COMMUNITY): Payer: BLUE CROSS/BLUE SHIELD

## 2016-03-26 ENCOUNTER — Encounter (HOSPITAL_COMMUNITY): Payer: BLUE CROSS/BLUE SHIELD

## 2016-03-29 ENCOUNTER — Encounter (HOSPITAL_COMMUNITY): Payer: BLUE CROSS/BLUE SHIELD

## 2016-03-30 ENCOUNTER — Encounter (HOSPITAL_COMMUNITY)
Admission: RE | Admit: 2016-03-30 | Discharge: 2016-03-30 | Disposition: A | Discharge status: Home / Self Care | Payer: BLUE CROSS/BLUE SHIELD | Source: Ambulatory Visit | Attending: Internal Medicine | Admitting: Internal Medicine

## 2016-03-30 VITALS — Wt 122.4 lb

## 2016-03-30 DIAGNOSIS — J9610 Chronic respiratory failure, unspecified whether with hypoxia or hypercapnia: Secondary | ICD-10-CM | POA: Insufficient documentation

## 2016-03-30 DIAGNOSIS — J479 Bronchiectasis, uncomplicated: Secondary | ICD-10-CM | POA: Diagnosis not present

## 2016-03-30 DIAGNOSIS — Z5189 Encounter for other specified aftercare: Secondary | ICD-10-CM | POA: Diagnosis present

## 2016-03-30 DIAGNOSIS — I272 Pulmonary hypertension, unspecified: Secondary | ICD-10-CM

## 2016-03-30 NOTE — Progress Notes (Signed)
Deborah [name] 66 y.o. female Nutrition Note Spoke with pt. Pt is underweight for a pulmonary pt. Pt reports 6 lb wt loss during hospitalization in 01/2016. Pt has maintained her wt since hospitalization, but has not been gaining wt. Pt has not been actively trying to gain wt. Pt states "I've just been trying to get back to eating." High Calorie, High Protein diet education reinforced. Pt denies the need for further diet education/handouts at this time.  Pt's Rate Your Plate results not reviewed with pt due to pt need to gain wt. Pt avoids salty food; does not use canned/ convenience food and eats out infrequently. Pt expressed understanding of the information reviewed via feedback method.    No results found for: HGBA1C  Nutrition Diagnosis ? Food-and nutrition-related knowledge deficit related to lack of exposure to information as related to diagnosis of pulmonary disease ? Increased energy expenditure related to increased energy requirements during recent hospitalization as evidenced by BMI < 21 and recent h/o wt loss. Nutrition Intervention ? Pt's individual nutrition plan and goals reviewed with pt. ? Benefits of adopting healthy eating habits discussed when pt's Rate Your Plate reviewed. ? Pt to attend the Nutrition and Lung Disease class ? Continual client-centered nutrition education by RD, as part of interdisciplinary care. Goal(s) 1. The pt will consume high-energy, high-nutrient dense beverages (e.g. Adding whey protein powder to milk and/or using ultrafiltered milk to increase protein intake) to compensate for decreased oral intake of solid foods. 2. Identify food quantities necessary to prevent further wt loss/ promote wt gain at graduation from pulmonary rehab. Monitor and Evaluate progress toward nutrition goal with team.   Derek Mound, M.Ed, RD, LDN, CDE 03/30/2016 2:48 PM

## 2016-03-30 NOTE — Progress Notes (Signed)
Daily Session Note  Patient Details  Name: Deborah [name] MRN: [id number] Date of Birth: [birth year] Referring Provider:   [name] [facility]monary Rehab Walk Test from 02/12/2016 in [place]  Referring Provider  Dr. Nelda Marseille      Encounter Date: 03/30/2016  Check In:     Session Check In - 03/30/16 1328      Check-In   Location [facility]   Staff Present Su Hilt, MS, ACSM RCEP, Exercise Physiologist;Joan Leonia Reeves, RN, BSN   Supervising physician immediately available to respond to emergencies [facility] immediately available   Physician(s) Dr. Cathlean Sauer   Medication changes reported     No   Fall or balance concerns reported    No   Warm-up and Cool-down Performed as group-led instruction   Resistance Training Performed Yes   VAD Patient? No     Pain Assessment   Currently in Pain? No/denies   Multiple Pain Sites No      Capillary Blood Glucose: No results found for this or any previous visit (from the past 24 hour(s)).      Exercise Prescription Changes - 03/30/16 1500      Exercise Review   Progression Yes     Response to Exercise   Blood Pressure (Admit) 114/70   Blood Pressure (Exercise) 120/56   Blood Pressure (Exit) 120/60   Heart Rate (Admit) 120 bpm   Heart Rate (Exercise) 129 bpm   Heart Rate (Exit) 114 bpm   Oxygen Saturation (Admit) 99 %   Oxygen Saturation (Exercise) 91 %   Oxygen Saturation (Exit) 100 %   Rating of Perceived Exertion (Exercise) 12   Perceived Dyspnea (Exercise) 1   Duration Progress to 45 minutes of aerobic exercise without signs/symptoms of physical distress   Intensity THRR unchanged     Progression   Progression Continue to progress workloads to maintain intensity without signs/symptoms of physical distress.     Resistance Training   Training Prescription Yes   Weight orange bands   Reps 10-12  10 minutes of strength training     Interval Training   Interval Training No     Oxygen   Oxygen Continuous   Liters 6     Recumbant Bike   Level 2   Minutes 17     NuStep   Level 2   Minutes 17   METs 1.6     Track   Laps 4   Minutes 17     Goals Met:  Exercise tolerated well No report of cardiac concerns or symptoms Strength training completed today  Goals Unmet:  Not Applicable  Comments: Service time is from 1330 to 1500     Dr. Rush Farmer is Medical Director for Pulmonary Rehab at [facility].

## 2016-03-31 ENCOUNTER — Encounter (HOSPITAL_COMMUNITY): Payer: BLUE CROSS/BLUE SHIELD

## 2016-04-01 ENCOUNTER — Encounter (HOSPITAL_COMMUNITY): Payer: BLUE CROSS/BLUE SHIELD

## 2016-04-02 ENCOUNTER — Encounter (HOSPITAL_COMMUNITY): Payer: BLUE CROSS/BLUE SHIELD

## 2016-04-05 ENCOUNTER — Encounter (HOSPITAL_COMMUNITY): Payer: Self-pay | Admitting: *Deleted

## 2016-04-05 ENCOUNTER — Inpatient Hospital Stay (HOSPITAL_COMMUNITY)
Admission: EM | Admit: 2016-04-05 | Discharge: 2016-04-12 | DRG: 190 | Disposition: A | Discharge status: Home Health | Payer: BLUE CROSS/BLUE SHIELD | Attending: Internal Medicine | Admitting: Internal Medicine

## 2016-04-05 ENCOUNTER — Encounter (HOSPITAL_COMMUNITY): Payer: BLUE CROSS/BLUE SHIELD

## 2016-04-05 ENCOUNTER — Emergency Department (HOSPITAL_COMMUNITY): Payer: BLUE CROSS/BLUE SHIELD

## 2016-04-05 DIAGNOSIS — I2510 Atherosclerotic heart disease of native coronary artery without angina pectoris: Secondary | ICD-10-CM | POA: Diagnosis present

## 2016-04-05 DIAGNOSIS — Z85118 Personal history of other malignant neoplasm of bronchus and lung: Secondary | ICD-10-CM

## 2016-04-05 DIAGNOSIS — I2729 Other secondary pulmonary hypertension: Secondary | ICD-10-CM | POA: Diagnosis present

## 2016-04-05 DIAGNOSIS — E872 Acidosis: Secondary | ICD-10-CM | POA: Diagnosis present

## 2016-04-05 DIAGNOSIS — B974 Respiratory syncytial virus as the cause of diseases classified elsewhere: Secondary | ICD-10-CM | POA: Diagnosis present

## 2016-04-05 DIAGNOSIS — E785 Hyperlipidemia, unspecified: Secondary | ICD-10-CM | POA: Diagnosis present

## 2016-04-05 DIAGNOSIS — R0600 Dyspnea, unspecified: Secondary | ICD-10-CM

## 2016-04-05 DIAGNOSIS — Z66 Do not resuscitate: Secondary | ICD-10-CM | POA: Diagnosis present

## 2016-04-05 DIAGNOSIS — K224 Dyskinesia of esophagus: Secondary | ICD-10-CM | POA: Diagnosis present

## 2016-04-05 DIAGNOSIS — D3A090 Benign carcinoid tumor of the bronchus and lung: Secondary | ICD-10-CM | POA: Diagnosis not present

## 2016-04-05 DIAGNOSIS — E860 Dehydration: Secondary | ICD-10-CM | POA: Diagnosis present

## 2016-04-05 DIAGNOSIS — G8929 Other chronic pain: Secondary | ICD-10-CM

## 2016-04-05 DIAGNOSIS — Z79899 Other long term (current) drug therapy: Secondary | ICD-10-CM

## 2016-04-05 DIAGNOSIS — J9601 Acute respiratory failure with hypoxia: Secondary | ICD-10-CM | POA: Diagnosis not present

## 2016-04-05 DIAGNOSIS — J84112 Idiopathic pulmonary fibrosis: Secondary | ICD-10-CM | POA: Diagnosis not present

## 2016-04-05 DIAGNOSIS — J9621 Acute and chronic respiratory failure with hypoxia: Secondary | ICD-10-CM | POA: Diagnosis present

## 2016-04-05 DIAGNOSIS — I110 Hypertensive heart disease with heart failure: Secondary | ICD-10-CM | POA: Diagnosis present

## 2016-04-05 DIAGNOSIS — Z9981 Dependence on supplemental oxygen: Secondary | ICD-10-CM | POA: Diagnosis not present

## 2016-04-05 DIAGNOSIS — K219 Gastro-esophageal reflux disease without esophagitis: Secondary | ICD-10-CM | POA: Diagnosis present

## 2016-04-05 DIAGNOSIS — R739 Hyperglycemia, unspecified: Secondary | ICD-10-CM

## 2016-04-05 DIAGNOSIS — J9819 Other pulmonary collapse: Secondary | ICD-10-CM | POA: Diagnosis not present

## 2016-04-05 DIAGNOSIS — Z902 Acquired absence of lung [part of]: Secondary | ICD-10-CM | POA: Diagnosis not present

## 2016-04-05 DIAGNOSIS — R0603 Acute respiratory distress: Secondary | ICD-10-CM | POA: Diagnosis not present

## 2016-04-05 DIAGNOSIS — M8580 Other specified disorders of bone density and structure, unspecified site: Secondary | ICD-10-CM | POA: Diagnosis present

## 2016-04-05 DIAGNOSIS — I5022 Chronic systolic (congestive) heart failure: Secondary | ICD-10-CM | POA: Diagnosis present

## 2016-04-05 DIAGNOSIS — Z888 Allergy status to other drugs, medicaments and biological substances status: Secondary | ICD-10-CM | POA: Diagnosis not present

## 2016-04-05 DIAGNOSIS — F329 Major depressive disorder, single episode, unspecified: Secondary | ICD-10-CM | POA: Diagnosis present

## 2016-04-05 DIAGNOSIS — I7300 Raynaud's syndrome without gangrene: Secondary | ICD-10-CM | POA: Diagnosis present

## 2016-04-05 DIAGNOSIS — J9622 Acute and chronic respiratory failure with hypercapnia: Secondary | ICD-10-CM | POA: Diagnosis present

## 2016-04-05 DIAGNOSIS — J984 Other disorders of lung: Secondary | ICD-10-CM | POA: Diagnosis present

## 2016-04-05 DIAGNOSIS — J9690 Respiratory failure, unspecified, unspecified whether with hypoxia or hypercapnia: Secondary | ICD-10-CM

## 2016-04-05 DIAGNOSIS — B338 Other specified viral diseases: Secondary | ICD-10-CM

## 2016-04-05 DIAGNOSIS — J471 Bronchiectasis with (acute) exacerbation: Principal | ICD-10-CM | POA: Diagnosis present

## 2016-04-05 DIAGNOSIS — R5381 Other malaise: Secondary | ICD-10-CM | POA: Diagnosis not present

## 2016-04-05 DIAGNOSIS — J9600 Acute respiratory failure, unspecified whether with hypoxia or hypercapnia: Secondary | ICD-10-CM | POA: Diagnosis present

## 2016-04-05 DIAGNOSIS — J9811 Atelectasis: Secondary | ICD-10-CM | POA: Diagnosis present

## 2016-04-05 DIAGNOSIS — J181 Lobar pneumonia, unspecified organism: Secondary | ICD-10-CM | POA: Diagnosis not present

## 2016-04-05 DIAGNOSIS — R509 Fever, unspecified: Secondary | ICD-10-CM | POA: Diagnosis present

## 2016-04-05 DIAGNOSIS — Z7951 Long term (current) use of inhaled steroids: Secondary | ICD-10-CM | POA: Diagnosis not present

## 2016-04-05 DIAGNOSIS — A419 Sepsis, unspecified organism: Secondary | ICD-10-CM

## 2016-04-05 DIAGNOSIS — J189 Pneumonia, unspecified organism: Secondary | ICD-10-CM

## 2016-04-05 DIAGNOSIS — I509 Heart failure, unspecified: Secondary | ICD-10-CM | POA: Diagnosis not present

## 2016-04-05 DIAGNOSIS — G894 Chronic pain syndrome: Secondary | ICD-10-CM | POA: Diagnosis not present

## 2016-04-05 LAB — CBC WITH DIFFERENTIAL/PLATELET
BASOS ABS: 0.1 10*3/uL (ref 0.0–0.1)
Basophils Relative: 1 %
EOS ABS: 0.1 10*3/uL (ref 0.0–0.7)
EOS PCT: 1 %
HCT: 31.6 % — ABNORMAL LOW (ref 36.0–46.0)
Hemoglobin: 9.2 g/dL — ABNORMAL LOW (ref 12.0–15.0)
Lymphocytes Relative: 20 %
Lymphs Abs: 2.1 10*3/uL (ref 0.7–4.0)
MCH: 26.7 pg (ref 26.0–34.0)
MCHC: 29.1 g/dL — ABNORMAL LOW (ref 30.0–36.0)
MCV: 91.6 fL (ref 78.0–100.0)
MONO ABS: 1.1 10*3/uL — AB (ref 0.1–1.0)
Monocytes Relative: 10 %
Neutro Abs: 7.2 10*3/uL (ref 1.7–7.7)
Neutrophils Relative %: 68 %
Platelets: 385 10*3/uL (ref 150–400)
RBC: 3.45 MIL/uL — AB (ref 3.87–5.11)
RDW: 16.5 % — ABNORMAL HIGH (ref 11.5–15.5)
WBC: 10.5 10*3/uL (ref 4.0–10.5)

## 2016-04-05 LAB — COMPREHENSIVE METABOLIC PANEL
ALT: 26 U/L (ref 14–54)
AST: 34 U/L (ref 15–41)
Albumin: 3.4 g/dL — ABNORMAL LOW (ref 3.5–5.0)
Alkaline Phosphatase: 94 U/L (ref 38–126)
Anion gap: 15 (ref 5–15)
BUN: 16 mg/dL (ref 6–20)
CHLORIDE: 97 mmol/L — AB (ref 101–111)
CO2: 27 mmol/L (ref 22–32)
CREATININE: 0.88 mg/dL (ref 0.44–1.00)
Calcium: 9.2 mg/dL (ref 8.9–10.3)
Glucose, Bld: 269 mg/dL — ABNORMAL HIGH (ref 65–99)
POTASSIUM: 4.6 mmol/L (ref 3.5–5.1)
SODIUM: 139 mmol/L (ref 135–145)
Total Bilirubin: 0.5 mg/dL (ref 0.3–1.2)
Total Protein: 6.7 g/dL (ref 6.5–8.1)

## 2016-04-05 LAB — I-STAT VENOUS BLOOD GAS, ED
ACID-BASE EXCESS: 8 mmol/L — AB (ref 0.0–2.0)
Acid-Base Excess: 2 mmol/L (ref 0.0–2.0)
BICARBONATE: 31.9 mmol/L — AB (ref 20.0–28.0)
BICARBONATE: 33.6 mmol/L — AB (ref 20.0–28.0)
O2 SAT: 50 %
O2 Saturation: 98 %
PCO2 VEN: 83.7 mmHg — AB (ref 44.0–60.0)
PH VEN: 7.415 (ref 7.250–7.430)
TCO2: 34 mmol/L (ref 0–100)
TCO2: 35 mmol/L (ref 0–100)
pCO2, Ven: 52.4 mmHg (ref 44.0–60.0)
pH, Ven: 7.19 — CL (ref 7.250–7.430)
pO2, Ven: 35 mmHg (ref 32.0–45.0)
pO2, Ven: 109 mmHg — ABNORMAL HIGH (ref 32.0–45.0)

## 2016-04-05 LAB — I-STAT CG4 LACTIC ACID, ED
LACTIC ACID, VENOUS: 5.94 mmol/L — AB (ref 0.5–1.9)
Lactic Acid, Venous: 1.23 mmol/L (ref 0.5–1.9)

## 2016-04-05 LAB — I-STAT TROPONIN, ED: TROPONIN I, POC: 0.05 ng/mL (ref 0.00–0.08)

## 2016-04-05 LAB — MAGNESIUM: MAGNESIUM: 1.7 mg/dL (ref 1.7–2.4)

## 2016-04-05 LAB — PROCALCITONIN: PROCALCITONIN: 0.82 ng/mL

## 2016-04-05 LAB — RESPIRATORY PANEL BY PCR
Adenovirus: NOT DETECTED
BORDETELLA PERTUSSIS-RVPCR: NOT DETECTED
CHLAMYDOPHILA PNEUMONIAE-RVPPCR: NOT DETECTED
CORONAVIRUS 229E-RVPPCR: NOT DETECTED
Coronavirus HKU1: NOT DETECTED
Coronavirus NL63: NOT DETECTED
Coronavirus OC43: NOT DETECTED
Influenza A: NOT DETECTED
Influenza B: NOT DETECTED
Metapneumovirus: NOT DETECTED
Mycoplasma pneumoniae: NOT DETECTED
PARAINFLUENZA VIRUS 2-RVPPCR: NOT DETECTED
Parainfluenza Virus 1: NOT DETECTED
Parainfluenza Virus 3: NOT DETECTED
Parainfluenza Virus 4: NOT DETECTED
Respiratory Syncytial Virus: DETECTED — AB
Rhinovirus / Enterovirus: NOT DETECTED

## 2016-04-05 LAB — I-STAT CHEM 8, ED
BUN: 23 mg/dL — ABNORMAL HIGH (ref 6–20)
CHLORIDE: 98 mmol/L — AB (ref 101–111)
CREATININE: 0.8 mg/dL (ref 0.44–1.00)
Calcium, Ion: 1.17 mmol/L (ref 1.15–1.40)
Glucose, Bld: 267 mg/dL — ABNORMAL HIGH (ref 65–99)
HEMATOCRIT: 31 % — AB (ref 36.0–46.0)
Hemoglobin: 10.5 g/dL — ABNORMAL LOW (ref 12.0–15.0)
Potassium: 4.5 mmol/L (ref 3.5–5.1)
SODIUM: 138 mmol/L (ref 135–145)
TCO2: 30 mmol/L (ref 0–100)

## 2016-04-05 LAB — STREP PNEUMONIAE URINARY ANTIGEN: STREP PNEUMO URINARY ANTIGEN: NEGATIVE

## 2016-04-05 LAB — TROPONIN I
TROPONIN I: 0.49 ng/mL — AB (ref <0.03)
TROPONIN I: 0.48 ng/mL — AB (ref <0.03)

## 2016-04-05 LAB — GLUCOSE, CAPILLARY: Glucose-Capillary: 147 mg/dL — ABNORMAL HIGH (ref 65–99)

## 2016-04-05 LAB — BRAIN NATRIURETIC PEPTIDE: B Natriuretic Peptide: 220 pg/mL — ABNORMAL HIGH (ref 0.0–100.0)

## 2016-04-05 LAB — PHOSPHORUS: Phosphorus: 4.2 mg/dL (ref 2.5–4.6)

## 2016-04-05 MED ORDER — GUAIFENESIN ER 600 MG PO TB12
600.0000 mg | ORAL_TABLET | Freq: Two times a day (BID) | ORAL | Status: DC
  Administered 2016-04-05 – 2016-04-12 (×15): 600 mg via ORAL
  Filled 2016-04-05 (×15): qty 1

## 2016-04-05 MED ORDER — POTASSIUM CHLORIDE CRYS ER 20 MEQ PO TBCR
20.0000 meq | EXTENDED_RELEASE_TABLET | ORAL | Status: DC
  Administered 2016-04-05 – 2016-04-12 (×4): 20 meq via ORAL
  Filled 2016-04-05 (×4): qty 1

## 2016-04-05 MED ORDER — ORAL CARE MOUTH RINSE
15.0000 mL | Freq: Two times a day (BID) | OROMUCOSAL | Status: DC
  Administered 2016-04-07 – 2016-04-12 (×5): 15 mL via OROMUCOSAL

## 2016-04-05 MED ORDER — ACETAMINOPHEN 650 MG RE SUPP: 650.0000 mg | Freq: Four times a day (QID) | RECTAL | Status: DC | PRN

## 2016-04-05 MED ORDER — PIPERACILLIN-TAZOBACTAM 3.375 G IVPB
3.3750 g | Freq: Three times a day (TID) | INTRAVENOUS | Status: DC
  Administered 2016-04-05 – 2016-04-07 (×6): 3.375 g via INTRAVENOUS
  Filled 2016-04-05 (×7): qty 50

## 2016-04-05 MED ORDER — BUDESONIDE 0.5 MG/2ML IN SUSP
0.5000 mg | Freq: Two times a day (BID) | RESPIRATORY_TRACT | Status: DC
  Administered 2016-04-05 – 2016-04-12 (×14): 0.5 mg via RESPIRATORY_TRACT
  Filled 2016-04-05 (×15): qty 2

## 2016-04-05 MED ORDER — ARFORMOTEROL TARTRATE 15 MCG/2ML IN NEBU
15.0000 ug | INHALATION_SOLUTION | Freq: Two times a day (BID) | RESPIRATORY_TRACT | Status: DC
  Administered 2016-04-05 – 2016-04-12 (×14): 15 ug via RESPIRATORY_TRACT
  Filled 2016-04-05 (×17): qty 2

## 2016-04-05 MED ORDER — SODIUM CHLORIDE 3 % IN NEBU
4.0000 mL | INHALATION_SOLUTION | Freq: Every day | RESPIRATORY_TRACT | Status: AC
  Administered 2016-04-05 – 2016-04-06 (×2): 4 mL via RESPIRATORY_TRACT
  Filled 2016-04-05 (×4): qty 4

## 2016-04-05 MED ORDER — SODIUM CHLORIDE 0.9 % IV SOLN
INTRAVENOUS | Status: DC
  Administered 2016-04-05 – 2016-04-06 (×2): via INTRAVENOUS

## 2016-04-05 MED ORDER — GABAPENTIN 400 MG PO CAPS
400.0000 mg | ORAL_CAPSULE | Freq: Four times a day (QID) | ORAL | Status: DC
  Administered 2016-04-05 – 2016-04-08 (×12): 400 mg via ORAL
  Filled 2016-04-05 (×13): qty 1

## 2016-04-05 MED ORDER — MYCOPHENOLATE MOFETIL 500 MG PO TABS: 500.0000 mg | ORAL_TABLET | Freq: Two times a day (BID) | ORAL | Status: DC

## 2016-04-05 MED ORDER — IPRATROPIUM-ALBUTEROL 0.5-2.5 (3) MG/3ML IN SOLN: 3.0000 mL | RESPIRATORY_TRACT | Status: DC | PRN

## 2016-04-05 MED ORDER — ATORVASTATIN CALCIUM 10 MG PO TABS
10.0000 mg | ORAL_TABLET | Freq: Every day | ORAL | Status: DC
  Administered 2016-04-05 – 2016-04-12 (×8): 10 mg via ORAL
  Filled 2016-04-05 (×9): qty 1

## 2016-04-05 MED ORDER — ALBUTEROL SULFATE (2.5 MG/3ML) 0.083% IN NEBU
2.5000 mg | INHALATION_SOLUTION | RESPIRATORY_TRACT | Status: DC | PRN
  Administered 2016-04-06 – 2016-04-07 (×3): 2.5 mg via RESPIRATORY_TRACT
  Filled 2016-04-05 (×3): qty 3

## 2016-04-05 MED ORDER — PIPERACILLIN-TAZOBACTAM 3.375 G IVPB 30 MIN
3.3750 g | Freq: Once | INTRAVENOUS | Status: AC
  Administered 2016-04-05: 3.375 g via INTRAVENOUS
  Filled 2016-04-05: qty 50

## 2016-04-05 MED ORDER — ALBUTEROL SULFATE (2.5 MG/3ML) 0.083% IN NEBU
15.0000 mg | INHALATION_SOLUTION | Freq: Once | RESPIRATORY_TRACT | Status: AC
  Administered 2016-04-05: 15 mg via RESPIRATORY_TRACT

## 2016-04-05 MED ORDER — TADALAFIL (PAH) 20 MG PO TABS
40.0000 mg | ORAL_TABLET | Freq: Every day | ORAL | Status: DC
  Administered 2016-04-05 – 2016-04-11 (×7): 40 mg via ORAL
  Filled 2016-04-05 (×7): qty 2

## 2016-04-05 MED ORDER — PANTOPRAZOLE SODIUM 40 MG PO TBEC
40.0000 mg | DELAYED_RELEASE_TABLET | Freq: Every day | ORAL | Status: DC
  Administered 2016-04-05 – 2016-04-08 (×4): 40 mg via ORAL
  Filled 2016-04-05 (×5): qty 1

## 2016-04-05 MED ORDER — OXYCODONE HCL 5 MG PO TABS
5.0000 mg | ORAL_TABLET | Freq: Four times a day (QID) | ORAL | Status: DC | PRN
  Administered 2016-04-05 – 2016-04-09 (×14): 5 mg via ORAL
  Filled 2016-04-05 (×14): qty 1

## 2016-04-05 MED ORDER — METHYLPREDNISOLONE SODIUM SUCC 125 MG IJ SOLR
60.0000 mg | Freq: Three times a day (TID) | INTRAMUSCULAR | Status: DC
  Administered 2016-04-05 – 2016-04-08 (×9): 60 mg via INTRAVENOUS
  Filled 2016-04-05 (×9): qty 2

## 2016-04-05 MED ORDER — SACCHAROMYCES BOULARDII 250 MG PO CAPS
250.0000 mg | ORAL_CAPSULE | Freq: Two times a day (BID) | ORAL | Status: DC
  Administered 2016-04-05 – 2016-04-08 (×6): 250 mg via ORAL
  Filled 2016-04-05 (×7): qty 1

## 2016-04-05 MED ORDER — VANCOMYCIN HCL IN DEXTROSE 1-5 GM/200ML-% IV SOLN
1000.0000 mg | Freq: Once | INTRAVENOUS | Status: AC
  Administered 2016-04-05: 1000 mg via INTRAVENOUS
  Filled 2016-04-05: qty 200

## 2016-04-05 MED ORDER — SODIUM CHLORIDE 0.9% FLUSH
3.0000 mL | Freq: Two times a day (BID) | INTRAVENOUS | Status: DC
  Administered 2016-04-05 – 2016-04-12 (×12): 3 mL via INTRAVENOUS

## 2016-04-05 MED ORDER — MOMETASONE FURO-FORMOTEROL FUM 100-5 MCG/ACT IN AERO: 2.0000 | INHALATION_SPRAY | Freq: Two times a day (BID) | RESPIRATORY_TRACT | Status: DC

## 2016-04-05 MED ORDER — VANCOMYCIN HCL 500 MG IV SOLR
500.0000 mg | Freq: Two times a day (BID) | INTRAVENOUS | Status: DC
  Administered 2016-04-05 – 2016-04-06 (×3): 500 mg via INTRAVENOUS
  Filled 2016-04-05 (×4): qty 500

## 2016-04-05 MED ORDER — FUROSEMIDE 40 MG PO TABS
40.0000 mg | ORAL_TABLET | ORAL | Status: DC
  Administered 2016-04-05 – 2016-04-12 (×4): 40 mg via ORAL
  Filled 2016-04-05 (×3): qty 1
  Filled 2016-04-05: qty 2

## 2016-04-05 MED ORDER — ALBUTEROL (5 MG/ML) CONTINUOUS INHALATION SOLN
15.0000 mg | INHALATION_SOLUTION | Freq: Once | RESPIRATORY_TRACT | Status: DC
  Filled 2016-04-05: qty 20

## 2016-04-05 MED ORDER — FUROSEMIDE 20 MG PO TABS: 40.0000 mg | ORAL_TABLET | ORAL | Status: DC

## 2016-04-05 MED ORDER — TREPROSTINIL 0.6 MG/ML IN SOLN
18.0000 ug | Freq: Four times a day (QID) | RESPIRATORY_TRACT | Status: DC
  Administered 2016-04-05 – 2016-04-06 (×3): 18 ug via RESPIRATORY_TRACT
  Filled 2016-04-05: qty 2.9

## 2016-04-05 MED ORDER — ENOXAPARIN SODIUM 40 MG/0.4ML ~~LOC~~ SOLN
40.0000 mg | SUBCUTANEOUS | Status: DC
  Administered 2016-04-05 – 2016-04-11 (×7): 40 mg via SUBCUTANEOUS
  Filled 2016-04-05 (×7): qty 0.4

## 2016-04-05 MED ORDER — LIDOCAINE 5 % EX PTCH
1.0000 | MEDICATED_PATCH | CUTANEOUS | Status: DC
  Administered 2016-04-05 – 2016-04-12 (×8): 1 via TRANSDERMAL
  Filled 2016-04-05 (×8): qty 1

## 2016-04-05 MED ORDER — IPRATROPIUM BROMIDE 0.02 % IN SOLN
1.0000 mg | Freq: Once | RESPIRATORY_TRACT | Status: AC
  Administered 2016-04-05: 1 mg via RESPIRATORY_TRACT
  Filled 2016-04-05: qty 5

## 2016-04-05 MED ORDER — ACETAMINOPHEN 325 MG PO TABS: 650.0000 mg | ORAL_TABLET | Freq: Four times a day (QID) | ORAL | Status: DC | PRN

## 2016-04-05 MED ORDER — BUDESONIDE 0.5 MG/2ML IN SUSP: 0.5000 mg | Freq: Two times a day (BID) | RESPIRATORY_TRACT | Status: DC

## 2016-04-05 MED ORDER — PROMETHAZINE HCL 25 MG PO TABS: 12.5000 mg | ORAL_TABLET | Freq: Four times a day (QID) | ORAL | Status: DC | PRN

## 2016-04-05 MED ORDER — DULOXETINE HCL 60 MG PO CPEP
60.0000 mg | ORAL_CAPSULE | Freq: Every evening | ORAL | Status: DC
  Administered 2016-04-05 – 2016-04-11 (×7): 60 mg via ORAL
  Filled 2016-04-05 (×8): qty 1

## 2016-04-05 MED ORDER — TREPROSTINIL 0.6 MG/ML IN SOLN
18.0000 ug | Freq: Four times a day (QID) | RESPIRATORY_TRACT | Status: DC
  Filled 2016-04-05 (×33): qty 0.03

## 2016-04-05 MED ORDER — MYCOPHENOLATE MOFETIL 250 MG PO CAPS: 500.0000 mg | ORAL_CAPSULE | Freq: Two times a day (BID) | ORAL | Status: DC

## 2016-04-05 MED ORDER — MYCOPHENOLATE MOFETIL 250 MG PO CAPS
1000.0000 mg | ORAL_CAPSULE | Freq: Two times a day (BID) | ORAL | Status: DC
  Administered 2016-04-05 – 2016-04-12 (×14): 1000 mg via ORAL
  Filled 2016-04-05 (×15): qty 4

## 2016-04-05 MED ORDER — SODIUM CHLORIDE 0.9 % IV BOLUS (SEPSIS)
500.0000 mL | Freq: Once | INTRAVENOUS | Status: AC
  Administered 2016-04-05: 500 mL via INTRAVENOUS

## 2016-04-05 MED ORDER — HYDROCORTISONE NA SUCCINATE PF 100 MG IJ SOLR
100.0000 mg | Freq: Once | INTRAMUSCULAR | Status: AC
  Administered 2016-04-05: 100 mg via INTRAVENOUS
  Filled 2016-04-05: qty 2

## 2016-04-05 NOTE — H&P (Signed)
History and Physical    Deborah [name] KMM:[id number] DOB: [birth year] DOA: 04/05/2016  PCP: Jerlyn Ly, MD Patient coming from: Home  Chief Complaint: Cough, fever, SOB  HPI: Deborah [name] is a 66 y.o. female with medical history significant of C. 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 6 days ago patient began to feel ill with no specific focal complaints. 5 days ago patient developed cough and fever with sputum production. Patient was standing order to start Levaquin 1 over the symptoms begin. Start Levaquin that evening. Possible mild improvement over the next 3-4 days and able to maintain O2 saturations on baseline oxygen titration of 4-6 L. Continue to have intermittent fevers during this time as high as 101.5. Marked worsening of respiratory status over the last 24 hours prompting family to call EMS who placed patient on CPAP and brought her emergently to [facility] workplace and was placed on BiPAP in the ED. Denies diarrhea, abdominal pain, chest pain, palpitations, nausea, vomiting, rash, neck stiffness, headache.  History provided predominantly by patient's husband as patient is on BiPAP and unable to provide significant portion of the information. Patient is awake and alert however and able to communicate by nodding her head and minimal speech.  ED Course: Objective findings outlined below. Patient placed on BiPAP. Pulmonology consulted for evaluation and patient started on broad-spectrum antibiotics and given steroids.  Review of Systems: As per HPI otherwise 10 point review of systems negative.   Ambulatory Status: Limited secondary to severe respiratory failure.  Past Medical History:  Diagnosis Date  . Anemia   . C. difficile colitis   . CAD (coronary artery disease)   . Cataract   . CHF (congestive  heart failure) (HCC)    right sided heart failure from pulmonary hypertension  . COPD (chronic obstructive pulmonary disease) ([place])   . Gallstones   . Hearing loss    right  . Hyperactive airway disease   . Hyperlipidemia   . Hypertension   . Lung cancer ([place]) carcinoid tumor  2004  . Osteopenia   . Pneumonia   . Raynaud's disease   . Restrictive lung disease   . Shingles   . Tubulovillous adenoma of rectum 2007    Past Surgical History:  Procedure Laterality Date  . broncho pleural fistula  2004  . COLONOSCOPY    . pulmonary carcinoid  2004  . thoracotomies  2004  . VIDEO BRONCHOSCOPY  12/07/2011   Procedure: VIDEO BRONCHOSCOPY WITHOUT FLUORO;  Surgeon: Kathee Delton, MD;  Location: [facility];  Service: Cardiopulmonary;  Laterality: Bilateral;    Social History   Social History  . Marital status: Married    Spouse name: N/A  . Number of children: 0  . Years of education: N/A   Occupational History  . retired    Social History Main Topics  . Smoking status: Never Smoker  . Smokeless tobacco: Never Used  . Alcohol use No  . Drug use: No  . Sexual activity: No   Other Topics Concern  . Not on file   Social History Narrative  . No narrative on file    Allergies  Allergen Reactions  . Megace Es [Megestrol Acetate]     Severe leg cramps   . Megestrol Acetate     REACTION: rash  . Lodine [Etodolac] Rash    REACTION: rash  Family History  Problem Relation Age of Onset  . Adopted: Yes    Prior to Admission medications   Medication Sig Start Date End Date Taking? Authorizing Provider  albuterol (PROVENTIL HFA;VENTOLIN HFA) 108 (90 BASE) MCG/ACT inhaler Inhale 2 puffs into the lungs every 6 (six) hours as needed. For shortness of breath. 10/26/13   Kathee Delton, MD  calcium-vitamin D (OSCAL WITH D) 500-200 MG-UNIT per tablet Take 1 tablet by mouth daily.    Historical Provider, MD  cholecalciferol (VITAMIN D) 1000 units tablet Take 2,000 Units by  mouth 2 (two) times a week.    Historical Provider, MD  cholestyramine light (PREVALITE) 4 GM/DOSE powder Take 1-2 scoopfulls at bedtime as needed 06/27/13   Kathee Delton, MD  denosumab (PROLIA) 60 MG/ML SOLN injection Inject 60 mg into the skin every 6 (six) months. Administer in upper arm, thigh, or abdomen    Historical Provider, MD  DULoxetine (CYMBALTA) 60 MG capsule Take 60 mg by mouth every evening.     Historical Provider, MD  furosemide (LASIX) 40 MG tablet Take 40 mg by mouth daily.  12/06/13 10/06/15  Historical Provider, MD  gabapentin (NEURONTIN) 400 MG capsule Take 400 mg by mouth 4 (four) times daily.    Historical Provider, MD  guaiFENesin (MUCINEX) 600 MG 12 hr tablet Take 600 mg by mouth daily.  06/08/12   Theodis Blaze, MD  lidocaine (LIDODERM) 5 % Place 1 patch onto the skin daily. Remove & Discard patch within 12 hours or as directed by MD    Historical Provider, MD  mometasone-formoterol (DULERA) 100-5 MCG/ACT AERO Inhale 2 puffs into the lungs 2 (two) times daily. 08/07/13   Kathee Delton, MD  Multiple Vitamin (MULTIVITAMIN WITH MINERALS) TABS Take 1 tablet by mouth daily.    Historical Provider, MD  mycophenolate (CELLCEPT) 500 MG tablet Take 500 mg by mouth as directed.     Historical Provider, MD  oxycodone (OXY-IR) 5 MG capsule Take 5 mg by mouth every 6 (six) hours as needed.    Historical Provider, MD  OXYGEN Inhale into the lungs. Continuous, 4 L/min when exercising and 3 L/min when sitting    Historical Provider, MD  pantoprazole (PROTONIX) 40 MG tablet Take by mouth. 01/07/14 10/06/15  Historical Provider, MD  Polyethyl Glycol-Propyl Glycol 0.4-0.3 % SOLN Apply to eye.    Historical Provider, MD  potassium chloride SA (K-DUR,KLOR-CON) 20 MEQ tablet Take by mouth. 12/06/13 10/06/15  Historical Provider, MD  predniSONE (DELTASONE) 10 MG tablet Take by mouth. 01/26/16   Historical Provider, MD  saccharomyces boulardii (FLORASTOR) 250 MG capsule Take 1 capsule (250 mg total)  by mouth 2 (two) times daily. 01/05/12   Kathee Delton, MD  Vitamin D, Ergocalciferol, (DRISDOL) 50000 UNITS CAPS Take 50,000 Units by mouth every 7 (seven) days.     Historical Provider, MD    Physical Exam: Vitals:   04/05/16 0930 04/05/16 0953 04/05/16 1000 04/05/16 1200  BP: 112/69  106/55 135/66  Pulse: (!) 124  117 112  Resp: (!) 28  26 (!) 28  Temp:  97.5 F (36.4 C)    TempSrc:  Axillary    SpO2: 100%  100% 97%     General: Appears somehwat anxious sitting in bed.  Eyes:  PERRL, EOMI, normal lids, iris ENT:  grossly normal hearing, dry mm w/ BIPAP on Neck:  no LAD, masses or thyromegaly Cardiovascular:  RRR, no m/r/g. No LE edema.  Respiratory: Coarse breath sounds  with wheezing, rhonchi and crackles throughout with diminished sounds on the left. Increased effort. On BiPAP. CTA bilaterally, no w/r/r. Normal respiratory effort. Abdomen:  soft, ntnd, NABS Skin:  no rash or induration seen on limited exam Musculoskeletal:  grossly normal tone BUE/BLE, good ROM, no bony abnormality Psychiatric:  grossly normal mood and affect, speech fluent and appropriate, AOx3 Neurologic:  CN 2-12 grossly intact, moves all extremities in coordinated fashion, sensation intact  Labs on Admission: I have personally reviewed following labs and imaging studies  CBC:  Recent Labs Lab 04/05/16 0842 04/05/16 0904  WBC 10.5  --   NEUTROABS 7.2  --   HGB 9.2* 10.5*  HCT 31.6* 31.0*  MCV 91.6  --   PLT 385  --    Basic Metabolic Panel:  Recent Labs Lab 04/05/16 0842 04/05/16 0904  NA 139 138  K 4.6 4.5  CL 97* 98*  CO2 27  --   GLUCOSE 269* 267*  BUN 16 23*  CREATININE 0.88 0.80  CALCIUM 9.2  --    GFR: Estimated Creatinine Clearance: 63.1 mL/min (by C-G formula based on SCr of 0.8 mg/dL). Liver Function Tests:  Recent Labs Lab 04/05/16 0842  AST 34  ALT 26  ALKPHOS 94  BILITOT 0.5  PROT 6.7  ALBUMIN 3.4*   No results for input(s): LIPASE, AMYLASE in the last 168  hours. No results for input(s): AMMONIA in the last 168 hours. Coagulation Profile: No results for input(s): INR, PROTIME in the last 168 hours. Cardiac Enzymes: No results for input(s): CKTOTAL, CKMB, CKMBINDEX, TROPONINI in the last 168 hours. BNP (last 3 results) No results for input(s): PROBNP in the last 8760 hours. HbA1C: No results for input(s): HGBA1C in the last 72 hours. CBG: No results for input(s): GLUCAP in the last 168 hours. Lipid Profile: No results for input(s): CHOL, HDL, LDLCALC, TRIG, CHOLHDL, LDLDIRECT in the last 72 hours. Thyroid Function Tests: No results for input(s): TSH, T4TOTAL, FREET4, T3FREE, THYROIDAB in the last 72 hours. Anemia Panel: No results for input(s): VITAMINB12, FOLATE, FERRITIN, TIBC, IRON, RETICCTPCT in the last 72 hours. Urine analysis:    Component Value Date/Time   COLORURINE YELLOW 06/03/2012 2131   APPEARANCEUR CLEAR 06/03/2012 2131   LABSPEC 1.010 06/03/2012 2131   PHURINE 6.5 06/03/2012 2131   GLUCOSEU NEGATIVE 06/03/2012 2131   HGBUR NEGATIVE 06/03/2012 2131   HGBUR negative 02/25/2010 0917   BILIRUBINUR NEGATIVE 06/03/2012 2131   BILIRUBINUR n 03/01/2011 [place] 06/03/2012 2131   PROTEINUR NEGATIVE 06/03/2012 2131   UROBILINOGEN 0.2 06/03/2012 2131   NITRITE NEGATIVE 06/03/2012 2131   LEUKOCYTESUR NEGATIVE 06/03/2012 2131    Creatinine Clearance: Estimated Creatinine Clearance: 63.1 mL/min (by C-G formula based on SCr of 0.8 mg/dL).  Sepsis Labs: _0 (procalcitonin:4,lacticidven:4) ) Recent Results (from the past 240 hour(s))  Blood culture (routine x 2)     Status: None (Preliminary result)   Collection Time: 04/05/16  9:14 AM  Result Value Ref Range Status   Specimen Description BLOOD RIGHT ANTECUBITAL  Final   Special Requests BOTTLES DRAWN AEROBIC ONLY  5CC  Final   Culture PENDING  Incomplete   Report Status PENDING  Incomplete     Radiological Exams on Admission: Dg Chest Port 1  View  Result Date: 04/05/2016 CLINICAL DATA:  Dyspnea, on BiPAP EXAM: PORTABLE CHEST 1 VIEW COMPARISON:  09/18/2013 FINDINGS: There is worsening in aeration with almost complete opacification of the left hemithorax. The patient is status post partial left  lung resection. There is atelectasis or infiltrate in the remaining left lung. Probable small left hydro thorax. There is patchy airspace of opacification in right upper and lower lobe suspicious for superimposed pneumonitis or infiltrate. IMPRESSION: Almost complete opacification of the left hemithorax. Status post partial left lung resection. Atelectasis or infiltrate in the remaining left lung. Probable small left hydro thorax. Patchy pneumonitis or infiltrate in right upper and right lower lobe. Electronically Signed   By: Lahoma Crocker M.D.   On: 04/05/2016 09:18    EKG: Independently reviewed. Sinus, ST changes noted in inferior and lateral leads. Per review of care everywhere these changes have been present since at least 12/2015  Assessment/Plan Active Problems:   Acute respiratory failure ([place])   Carcinoid tumor of left lung   S/P partial lobectomy of lung   Chronic pain   Acute on chronic respiratory failure with hypoxia and hypercapnia ([place])   Hyperglycemia   Sepsis ([place])   Acute on chronic respiratory failure: Likely secondary to HCAP and failed outpt Levaquin in the setting of partial left lung resection in 2004 secondary to carcinoid of the lung. Patient's baseline is 4-6 L nasal cannula. Start Levaquin 6 days prior to admission in the outpatient setting with worsening of symptoms. ABG showing pH 7.19, PCO2 83, PO2 35, bicarbonate 34, lactic acid 5.9, chest x-ray showing multiple areas of likely infiltrate in remaining left lung and right lung. - Stepdown - Wean BIPAP as able - Vanc/Zosyn - Procalcitonin, Res Vir panel - Pneumonia orderset - Appreciate pulmonology consult as this pt is very complex from a pulmonary standpoint.  -  Continue Solumedrol 60 Q8 started on ED. (baseline Prednisone 8m daily) - Continue Cellcept, Adcirca, Tyvaso, Lasix, Dulera, Mucinex - Repeat CXR in am  Chronic pain: - continue Oxy-IR, Neurontin, Lidocaine patch  Tachycardia: Sinus. EKG read as above. Unchanged from previous. Suspect from dehydration and infection - Cycle Trop - EKG in am  Sepsis: Pt technically meets criteria given tachycardia, tachypnea, respiratory failure, Lacitc acid 5.9 and HCAP, though suspect pt overall condition likely related to HCAP and dehydration than true sepsis.  - Sepsis protocol initiated.   Hyperglycemia: 269 on admission. No history of diabetes. - CBG ACHS - A1c  Depression: - continue cymbalta  HLD: - continue statin  GERd: - continue PPI  DVT prophylaxis: Lovenox  Code Status: FULL - confirmed at time of admission though may need to be readressed prior to DC as pt unable to voice much of an opinion while on BIPAP  Family Communication: Husband  Disposition Plan: pending improvement   Consults called: Pulmonology  Admission status: inpt    MERRELL, DAVID J MD [facility]  If 7PM-7AM, please contact night-coverage [email or web address] Password [email or web address] 04/05/2016, 12:54 PM

## 2016-04-05 NOTE — ED Notes (Signed)
Admitting md at bedside

## 2016-04-05 NOTE — Progress Notes (Signed)
Patients husband states per MD CPT should be held until tomorrow. CPT will be given at regular scheduled time (8:00am) 04/06/2016.

## 2016-04-05 NOTE — Consult Note (Signed)
Name: MISSIE GEHRIG MRN: [id number] DOB: 19-Oct-1950    ADMISSION DATE:  04/05/2016 CONSULTATION DATE:  04/05/16  REFERRING MD :  Melina Copa - EDP  CHIEF COMPLAINT:  SOB   HISTORY OF PRESENT ILLNESS:  Deborah [name] is a 66 y.o. 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  She was admitted to [facility] 04/05/16 with acute on chronic hypoxic respiratory failure and probable bronchiectasis exacerbation.  Per husband, she began to feel poorly several days ago but really worsened over the past 1 day.  He wanted to bring her to ED on evening of 02/11 but she denied.  She apparently had started having more difficulty with ambulation due to fatigue and SOB several days ago and this persisted and worsened.  On day prior to admit, she was barely able to walk a few feet before becoming severely dyspneic.  This was despite increase in baseline O2 from 2L to 6L and occasionally up to 8L.  Husband states that pt had been coughing a lot but had no sputum production.  She had also had fever with Tmax 101.5.  She had not had any chills/sweats, headaches, chest pain, N/V/D, abd pain.  She had not had any recent sick contacts and no recent travel.  She sees Dr. Corrin Parker at [facility] and had admission to [facility] in November 2017 with parainfluenza 1.  During that admission, it was felt that she was experiencing silent aspiration (1 swallow eval demonstrated aspiration while the 2nd did not.  Both studies did demonstrate esophageal dysmotility however). Latest PFT's from May 2017:  FVC 1.32 (42% pred), FEV1 0.96 (39% pred), ratio 72.55%, TLC 36% pred, DLCO 5.35 (28%).  PAST MEDICAL HISTORY :   has a past medical history of Anemia; C. difficile colitis; CAD (coronary artery disease); Cataract; CHF (congestive heart failure) ([place]); COPD (chronic obstructive pulmonary disease) ([place]); Gallstones; Hearing  loss; Hyperactive airway disease; Hyperlipidemia; Hypertension; Lung cancer ([place]) (carcinoid tumor  2004); Osteopenia; Pneumonia; Raynaud's disease; Restrictive lung disease; Shingles; and Tubulovillous adenoma of rectum (2007).  has a past surgical history that includes broncho pleural fistula (2004); pulmonary carcinoid (2004); thoracotomies (2004); Video bronchoscopy (12/07/2011); and Colonoscopy. Prior to Admission medications   Medication Sig Start Date End Date Taking? Authorizing Provider  albuterol (PROVENTIL HFA;VENTOLIN HFA) 108 (90 BASE) MCG/ACT inhaler Inhale 2 puffs into the lungs every 6 (six) hours as needed. For shortness of breath. 10/26/13   Kathee Delton, MD  calcium-vitamin D (OSCAL WITH D) 500-200 MG-UNIT per tablet Take 1 tablet by mouth daily.    Historical Provider, MD  cholecalciferol (VITAMIN D) 1000 units tablet Take 2,000 Units by mouth 2 (two) times a week.    Historical Provider, MD  cholestyramine light (PREVALITE) 4 GM/DOSE powder Take 1-2 scoopfulls at bedtime as needed 06/27/13   Kathee Delton, MD  denosumab (PROLIA) 60 MG/ML SOLN injection Inject 60 mg into the skin every 6 (six) months. Administer in upper arm, thigh, or abdomen    Historical Provider, MD  DULoxetine (CYMBALTA) 60 MG capsule Take 60 mg by mouth every evening.     Historical Provider, MD  furosemide (LASIX) 40 MG tablet Take 40 mg by mouth daily.  12/06/13 10/06/15  Historical Provider, MD  gabapentin (NEURONTIN) 400 MG capsule Take 400 mg by mouth 4 (four) times daily.    Historical Provider, MD  guaiFENesin ([place]) 600  MG 12 hr tablet Take 600 mg by mouth daily.  06/08/12   Theodis Blaze, MD  lidocaine (LIDODERM) 5 % Place 1 patch onto the skin daily. Remove & Discard patch within 12 hours or as directed by MD    Historical Provider, MD  mometasone-formoterol (DULERA) 100-5 MCG/ACT AERO Inhale 2 puffs into the lungs 2 (two) times daily. 08/07/13   Kathee Delton, MD  Multiple Vitamin (MULTIVITAMIN  WITH MINERALS) TABS Take 1 tablet by mouth daily.    Historical Provider, MD  mycophenolate (CELLCEPT) 500 MG tablet Take 500 mg by mouth as directed.     Historical Provider, MD  oxycodone (OXY-IR) 5 MG capsule Take 5 mg by mouth every 6 (six) hours as needed.    Historical Provider, MD  OXYGEN Inhale into the lungs. Continuous, 4 L/min when exercising and 3 L/min when sitting    Historical Provider, MD  pantoprazole (PROTONIX) 40 MG tablet Take by mouth. 01/07/14 10/06/15  Historical Provider, MD  Polyethyl Glycol-Propyl Glycol 0.4-0.3 % SOLN Apply to eye.    Historical Provider, MD  potassium chloride SA (K-DUR,KLOR-CON) 20 MEQ tablet Take by mouth. 12/06/13 10/06/15  Historical Provider, MD  predniSONE (DELTASONE) 10 MG tablet Take by mouth. 01/26/16   Historical Provider, MD  saccharomyces boulardii (FLORASTOR) 250 MG capsule Take 1 capsule (250 mg total) by mouth 2 (two) times daily. 01/05/12   Kathee Delton, MD  Vitamin D, Ergocalciferol, (DRISDOL) 50000 UNITS CAPS Take 50,000 Units by mouth every 7 (seven) days.     Historical Provider, MD   Allergies  Allergen Reactions  . Megace Es [Megestrol Acetate]     Severe leg cramps   . Megestrol Acetate     REACTION: rash  . Lodine [Etodolac] Rash    REACTION: rash    FAMILY HISTORY:  family history is not on file. She was adopted. SOCIAL HISTORY:  reports that she has never smoked. She has never used smokeless tobacco. She reports that she does not drink alcohol or use drugs.  REVIEW OF SYSTEMS:   All negative; except for those that are bolded, which indicate positives.  Constitutional: weight loss, weight gain, night sweats, fevers, chills, fatigue, weakness.  HEENT: headaches, sore throat, sneezing, nasal congestion, post nasal drip, difficulty swallowing, tooth/dental problems, visual complaints, visual changes, ear aches. Neuro: difficulty with speech, weakness, numbness, ataxia. CV:  chest pain, orthopnea, PND, swelling in lower  extremities, dizziness, palpitations, syncope.  Resp: dry cough, hemoptysis, dyspnea, wheezing. GI: heartburn, indigestion, abdominal pain, nausea, vomiting, diarrhea, constipation, change in bowel habits, loss of appetite, hematemesis, melena, hematochezia.  GU: dysuria, change in color of urine, urgency or frequency, flank pain, hematuria. MSK: joint pain or swelling, decreased range of motion. Psych: change in mood or affect, depression, anxiety, suicidal ideations, homicidal ideations. Skin: rash, itching, bruising.    SUBJECTIVE:   VITAL SIGNS: Temp:  [97.5 F (36.4 C)] 97.5 F (36.4 C) (02/12 0953) Pulse Rate:  [119-141] 124 (02/12 0930) Resp:  [28-39] 28 (02/12 0930) BP: (112-147)/(69-82) 112/69 (02/12 0930) SpO2:  [100 %] 100 % (02/12 0930) FiO2 (%):  [100 %] 100 % (02/12 0913)  PHYSICAL EXAMINATION: General: Chronically ill appearing female, comfortable on BiPAP. Neuro: A&O x 3, non-focal.  HEENT: [place]/AT. PERRL, sclerae anicteric.  BiPAP in place. Cardiovascular: RRR, no M/R/G.  Lungs: Respirations even and unlabored.  Coarse bilaterally.  Diminished air entry on left. Abdomen: BS x 4, soft, NT/ND.  Musculoskeletal: No gross deformities, no edema.  Skin: Intact, warm, no rashes. Telangectasia's noted on anterior chest.     Recent Labs Lab 04/05/16 0842 04/05/16 0904  NA 139 138  K 4.6 4.5  CL 97* 98*  CO2 27  --   BUN 16 23*  CREATININE 0.88 0.80  GLUCOSE 269* 267*    Recent Labs Lab 04/05/16 0842 04/05/16 0904  HGB 9.2* 10.5*  HCT 31.6* 31.0*  WBC 10.5  --   PLT 385  --    Dg Chest Port 1 View  Result Date: 04/05/2016 CLINICAL DATA:  Dyspnea, on BiPAP EXAM: PORTABLE CHEST 1 VIEW COMPARISON:  09/18/2013 FINDINGS: There is worsening in aeration with almost complete opacification of the left hemithorax. The patient is status post partial left lung resection. There is atelectasis or infiltrate in the remaining left lung. Probable small left hydro  thorax. There is patchy airspace of opacification in right upper and lower lobe suspicious for superimposed pneumonitis or infiltrate. IMPRESSION: Almost complete opacification of the left hemithorax. Status post partial left lung resection. Atelectasis or infiltrate in the remaining left lung. Probable small left hydro thorax. Patchy pneumonitis or infiltrate in right upper and right lower lobe. Electronically Signed   By: Lahoma Crocker M.D.   On: 04/05/2016 09:18    STUDIES:  CXR 02/12 > almost complete opacification left lung (chronic changes), atelectasis.  SIGNIFICANT EVENTS  02/12 > admit.  ASSESSMENT / PLAN:  Acute on chronic hypoxic + acute hypercarbic respiratory failure - multifactorial in the setting of known bronchiectasis, severe restrictive lung disease, ILD. Severe bronchiectasis - chronic; CXR now shows evolution / worsening of known left lung disease and CT from [facility] reportedly suggests possible BTX in right base as well. Severe restrictive lung disease with severely reduced diffusion capacity. Atelectasis. Plan: Continue BiPAP for now. Continue supplemental O2 as needed to maintain SpO2 > 92%. Agree with empiric broad spectrum abx. Follow cultures. Continue preadmission cellcept (2026m daily [facility] Continue inhaled hypertonic saline. Solumedrol [id number]. Start manual chest PT. Budesonide / brovana in lieu of preadmission dulera. Albuterol PRN. Consider mucomyst nebs if no improvement. Incentive spirometry. Follow CXR intermittently.  Rest per primary team.   RaMontey HoraPAHarrisonulmonary & Critical Care Medicine Pager: [phone] ([phone]/01/2017, 11:39 AM  Attending Note:  [age]ear old female with history of IPF who presents to the hospital with pneumonia and hypoxemic respiratory failure.  On exam, no BS are audible on the left with clear right.  I reviewed CXR myself, significant opacification of the  left lung with likely PNA.  Patient has had multiple surgeries on the left lung including a lobectomy (LLL) for carcinoid.  Patient is currently requiring BiPAP for increased WOB.  Continue BiPAP, change to PRN, abx, steroids and cellcept as ordered.  PCCM will follow.  The patient is critically ill with multiple organ systems failure and requires high complexity decision making for assessment and support, frequent evaluation and titration of therapies, application of advanced monitoring technologies and extensive interpretation of multiple databases.   Critical Care Time devoted to patient care services described in this note is  35  Minutes. This time reflects time of care of this signee Dr WeJennet MaduroThis critical care time does not reflect procedure time, or teaching time or supervisory time of PA/NP/Med student/Med Resident etc but could involve care discussion time.  WeRush FarmerM.D. [facility] LLCulmonary/Critical Care Medicine. Pager: [phone]  hours pager: [phone].

## 2016-04-05 NOTE — ED Triage Notes (Addendum)
To ED via [facility], from home, for increase sob. Pt with severe hx of lung disease. Arrived on CPAP. BLE appear mottled.

## 2016-04-05 NOTE — Progress Notes (Signed)
Per CCM NP R. Shearon Stalls, pt unable to take MDI at this time. Pt to take Pulmicort and Brovana HH[facility] BID in place of pre-admission Dulera for now. Order entered and adjusted per verbal order. RT will continue to monitor.

## 2016-04-05 NOTE — Progress Notes (Signed)
Pharmacy Antibiotic Note  Deborah [name] is a 66 y.o. female admitted on 04/05/2016 with pneumonia.  Pharmacy has been consulted for vancomycin and Zosyn dosing.  SCr 0.88, CrCl ~60-24m/min. Temperature not yet recorded, but HR is tachy with increased RR in ED.  Plan: Vancomycin 1g IV x1, then 504mIV q12h Zosyn 3.375g IV q8h EI Follow c/s, clinical progression, renal function, level PRN    No data recorded.   Recent Labs Lab 04/05/16 0842 04/05/16 0904 04/05/16 0905  WBC 10.5  --   --   CREATININE 0.88 0.80  --   LATICACIDVEN  --   --  5.94*    Estimated Creatinine Clearance: 63.1 mL/min (by C-G formula based on SCr of 0.8 mg/dL).    Allergies  Allergen Reactions  . Megace Es [Megestrol Acetate]     Severe leg cramps   . Megestrol Acetate     REACTION: rash  . Lodine [Etodolac] Rash    REACTION: rash    Antimicrobials this admission: Vanc 2/12 >>  Zosyn 2/12 >>   Dose adjustments this admission: n/a  Microbiology results: 2/12 BCx:    Thank you for allowing pharmacy to be a part of this patient's care.  Islam Villescas D. Tehani Mersman, PharmD, BCPS Clinical Pharmacist Pager: [phone]/01/2017 9:47 AM

## 2016-04-05 NOTE — ED Provider Notes (Signed)
[facility] DEPT Provider Note   CSN: [id number] Arrival date & time: 04/05/16  6237     History   Chief Complaint No chief complaint on file.   HPI Deborah [name] is a 66 y.o. female.  The history is provided by the EMS personnel.  Shortness of Breath  This is a recurrent problem. The average episode lasts 3 days. The problem occurs continuously.The current episode started more than 2 days ago. The problem has been rapidly worsening. Associated symptoms include a fever and cough. Pertinent negatives include no sore throat, no ear pain, no chest pain, no vomiting, no abdominal pain and no rash. She has had prior hospitalizations. Associated medical issues include COPD and chronic lung disease.    Past Medical History:  Diagnosis Date  . Anemia   . C. difficile colitis   . CAD (coronary artery disease)   . Cataract   . CHF (congestive heart failure) (HCC)    right sided heart failure from pulmonary hypertension  . COPD (chronic obstructive pulmonary disease) ([place])   . Gallstones   . Hearing loss    right  . Hyperactive airway disease   . Hyperlipidemia   . Hypertension   . Lung cancer ([place]) carcinoid tumor  2004  . Osteopenia   . Pneumonia   . Raynaud's disease   . Restrictive lung disease   . Shingles   . Tubulovillous adenoma of rectum 2007    Patient Active Problem List   Diagnosis Date Noted  . Acute respiratory failure ([place]) 04/05/2016  . 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 S/P partial lobectomy of lung 04/05/2016  . Chronic pain 04/05/2016  . Acute on chronic respiratory failure with hypoxia and hypercapnia ([place]) 04/05/2016  . Hyperglycemia 04/05/2016  . Sepsis ([place]) 04/05/2016  . Diarrhea 11/19/2014  . History of colonic polyps 11/19/2014  . Chronic respiratory failure ([place]) 08/07/2013  . Bronchiectasis without acute exacerbation ([place]) 12/01/2011  . Anemia 03/10/2011  . Restrictive lung disease 12/02/2010  . REACTIVE AIRWAY DISEASE  12/05/2009  . HYPERTENSION 06/18/2008  . BACK PAIN, CHRONIC 02/07/2008  . HLD (hyperlipidemia) 11/08/2006  . RAYNAUD'S DISEASE 11/08/2006  . OSTEOPENIA 11/08/2006    Past Surgical History:  Procedure Laterality Date  . broncho pleural fistula  2004  . COLONOSCOPY    . pulmonary carcinoid  2004  . thoracotomies  2004  . VIDEO BRONCHOSCOPY  12/07/2011   Procedure: VIDEO BRONCHOSCOPY WITHOUT FLUORO;  Surgeon: Kathee Delton, MD;  Location: [facility] ENDOSCOPY;  Service: Cardiopulmonary;  Laterality: Bilateral;    OB History    No data available       Home Medications    Prior to Admission medications   Medication Sig Start Date End Date Taking? Authorizing Provider  albuterol (PROVENTIL HFA;VENTOLIN HFA) 108 (90 BASE) MCG/ACT inhaler Inhale 2 puffs into the lungs every 6 (six) hours as needed. For shortness of breath. 10/26/13  Yes Kathee Delton, MD  atorvastatin (LIPITOR) 10 MG tablet Take 10 mg by mouth daily.   Yes Historical Provider, MD  cholecalciferol (VITAMIN D) 1000 units tablet Take 2,000 Units by mouth 2 (two) times a week.   Yes Historical Provider, MD  DULoxetine (CYMBALTA) 60 MG capsule Take 60 mg by mouth every evening.    Yes Historical Provider, MD  furosemide (LASIX) 40 MG tablet Take 40 mg by mouth 3 (three) times a week. M-W-F 12/06/13 04/05/17 Yes Historical Provider, MD  gabapentin (NEURONTIN) 400 MG capsule Take 400 mg by  mouth 4 (four) times daily.   Yes Historical Provider, MD  guaiFENesin (MUCINEX) 600 MG 12 hr tablet Take 600 mg by mouth 2 (two) times daily.  06/08/12  Yes Theodis Blaze, MD  lidocaine (LIDODERM) 5 % Place 1 patch onto the skin daily. Remove & Discard patch within 12 hours or as directed by MD   Yes Historical Provider, MD  mometasone-formoterol (DULERA) 100-5 MCG/ACT AERO Inhale 2 puffs into the lungs 2 (two) times daily. 08/07/13  Yes Kathee Delton, MD  Multiple Vitamin (MULTIVITAMIN WITH MINERALS) TABS Take 1 tablet by mouth daily.   Yes  Historical Provider, MD  mycophenolate (CELLCEPT) 500 MG tablet Take 1,000 mg by mouth 2 (two) times daily.    Yes Historical Provider, MD  oxycodone (OXY-IR) 5 MG capsule Take 5 mg by mouth every 6 (six) hours as needed.   Yes Historical Provider, MD  OXYGEN Inhale into the lungs. Continuous, 4 L/min when exercising and 6 L/min when sitting   Yes Historical Provider, MD  pantoprazole (PROTONIX) 40 MG tablet Take 40 mg by mouth daily.  01/07/14 04/05/17 Yes Historical Provider, MD  Polyethyl Glycol-Propyl Glycol 0.4-0.3 % SOLN Apply 1 drop to eye at bedtime as needed (dry).    Yes Historical Provider, MD  potassium chloride SA (K-DUR,KLOR-CON) 20 MEQ tablet Take 20 mEq by mouth 3 (three) times a week. M-W-F 12/06/13 04/05/17 Yes Historical Provider, MD  PredniSONE 5 MG TBEC Take 5 mg by mouth daily.  01/26/16  Yes Historical Provider, MD  saccharomyces boulardii (FLORASTOR) 250 MG capsule Take 1 capsule (250 mg total) by mouth 2 (two) times daily. 01/05/12  Yes Kathee Delton, MD  sodium chloride HYPERTONIC 3 % nebulizer solution Take 3 mLs by nebulization daily.   Yes Historical Provider, MD  tadalafil, PAH, (ADCIRCA) 20 MG tablet Take 40 mg by mouth at bedtime. 08/19/15  Yes Historical Provider, MD  Treprostinil (TYVASO) 0.6 MG/ML SOLN Inhale 18 mcg into the lungs 4 (four) times daily. 1.26m/2.9mL   (12breaths)   Yes Historical Provider, MD  vitamin B-12 (CYANOCOBALAMIN) 1000 MCG tablet Take 1,000 mcg by mouth 2 (two) times a week. Tues and Thurs   Yes Historical Provider, MD  cholestyramine light (PREVALITE) 4 GM/DOSE powder Take 1-2 scoopfulls at bedtime as needed Patient not taking: Reported on 04/05/2016 06/27/13   KKathee Delton MD  denosumab (PROLIA) 60 MG/ML SOLN injection Inject 60 mg into the skin every 6 (six) months. Administer in upper arm, thigh, or abdomen    Historical Provider, MD    Family History Family History  Problem Relation Age of Onset  . Adopted: Yes    Social  History Social History  Substance Use Topics  . Smoking status: Never Smoker  . Smokeless tobacco: Never Used  . Alcohol use No     Allergies   Megace es [megestrol acetate]; Megestrol acetate; and Lodine [etodolac]   Review of Systems Review of Systems  Constitutional: Positive for fever. Negative for chills.  HENT: Negative for ear pain and sore throat.   Eyes: Negative for pain and visual disturbance.  Respiratory: Positive for cough and shortness of breath.   Cardiovascular: Negative for chest pain and palpitations.  Gastrointestinal: Negative for abdominal pain and vomiting.  Genitourinary: Negative for dysuria and hematuria.  Musculoskeletal: Negative for arthralgias and back pain.  Skin: Negative for color change and rash.  Neurological: Negative for seizures and syncope.  All other systems reviewed and are negative.  Physical Exam Updated Vital Signs BP 135/66   Pulse 112   Temp 97.5 F (36.4 C) (Axillary)   Resp (!) 28   SpO2 98%   Physical Exam  Constitutional: She is oriented to person, place, and time. She appears well-developed. She appears distressed.  HENT:  Head: Normocephalic and atraumatic.  Eyes: EOM are normal. Pupils are equal, round, and reactive to light.  Neck: Normal range of motion. Neck supple.  Cardiovascular:  Tachycardic  Pulmonary/Chest: She is in respiratory distress. She has wheezes. She has rales.  Marked tachypnea, crackles to apices, decreased L lung aeration  Abdominal: Soft. She exhibits no distension. There is no tenderness.  Musculoskeletal: Normal range of motion. She exhibits edema.  Trace edema to b/l LE  Neurological: She is alert and oriented to person, place, and time.  Initially somnolent but improved rapidly on BiPAP to A&Ox4  Skin:  Cyanosis to b/l upper extremities, cool to touch  Psychiatric:  Somnolent     ED Treatments / Results  Labs (all labs ordered are listed, but only abnormal results are  displayed) Labs Reviewed  CBC WITH DIFFERENTIAL/PLATELET - Abnormal; Notable for the following:       Result Value   RBC 3.45 (*)    Hemoglobin 9.2 (*)    HCT 31.6 (*)    MCHC 29.1 (*)    RDW 16.5 (*)    Monocytes Absolute 1.1 (*)    All other components within normal limits  COMPREHENSIVE METABOLIC PANEL - Abnormal; Notable for the following:    Chloride 97 (*)    Glucose, Bld 269 (*)    Albumin 3.4 (*)    All other components within normal limits  BRAIN NATRIURETIC PEPTIDE - Abnormal; Notable for the following:    B Natriuretic Peptide 220.0 (*)    All other components within normal limits  TROPONIN I - Abnormal; Notable for the following:    Troponin I 0.49 (*)    All other components within normal limits  I-STAT CHEM 8, ED - Abnormal; Notable for the following:    Chloride 98 (*)    BUN 23 (*)    Glucose, Bld 267 (*)    Hemoglobin 10.5 (*)    HCT 31.0 (*)    All other components within normal limits  I-STAT VENOUS BLOOD GAS, ED - Abnormal; Notable for the following:    pH, Ven 7.190 (*)    pCO2, Ven 83.7 (*)    Bicarbonate 31.9 (*)    All other components within normal limits  I-STAT CG4 LACTIC ACID, ED - Abnormal; Notable for the following:    Lactic Acid, Venous 5.94 (*)    All other components within normal limits  I-STAT VENOUS BLOOD GAS, ED - Abnormal; Notable for the following:    pO2, Ven 109.0 (*)    Bicarbonate 33.6 (*)    Acid-Base Excess 8.0 (*)    All other components within normal limits  CULTURE, BLOOD (ROUTINE X 2)  CULTURE, BLOOD (ROUTINE X 2)  CULTURE, RESPIRATORY (NON-EXPECTORATED)  RESPIRATORY PANEL BY PCR  CULTURE, EXPECTORATED SPUTUM-ASSESSMENT  GRAM STAIN  PROCALCITONIN  MAGNESIUM  PHOSPHORUS  HIV ANTIBODY (ROUTINE TESTING)  STREP PNEUMONIAE URINARY ANTIGEN  TROPONIN I  TROPONIN I  LEGIONELLA PNEUMOPHILA SEROGP 1 UR AG  HEMOGLOBIN A1C  I-STAT TROPOININ, ED  I-STAT CG4 LACTIC ACID, ED  I-STAT CG4 LACTIC ACID, ED  I-STAT CG4 LACTIC  ACID, ED    EKG  EKG Interpretation  Date/Time:  Monday April 05 2016 09:18:47 EST Ventricular Rate:  123 PR Interval:    QRS Duration: 97 QT Interval:  340 QTC Calculation: 487 R Axis:   118 Text Interpretation:  Sinus tachycardia Consider left ventricular hypertrophy Repol abnrm suggests ischemia, diffuse leads ST elevation, consider lateral injury sinus tachy since with questionable ishcemic changes, non focal Confirmed by Gerald Leitz ([id number]) on 04/05/2016 10:34:03 AM       Radiology Dg Chest Port 1 View  Result Date: 04/05/2016 CLINICAL DATA:  Dyspnea, on BiPAP EXAM: PORTABLE CHEST 1 VIEW COMPARISON:  09/18/2013 FINDINGS: There is worsening in aeration with almost complete opacification of the left hemithorax. The patient is status post partial left lung resection. There is atelectasis or infiltrate in the remaining left lung. Probable small left hydro thorax. There is patchy airspace of opacification in right upper and lower lobe suspicious for superimposed pneumonitis or infiltrate. IMPRESSION: Almost complete opacification of the left hemithorax. Status post partial left lung resection. Atelectasis or infiltrate in the remaining left lung. Probable small left hydro thorax. Patchy pneumonitis or infiltrate in right upper and right lower lobe. Electronically Signed   By: Lahoma Crocker M.D.   On: 04/05/2016 09:18    Procedures Procedures (including critical care time)  Medications Ordered in ED Medications  vancomycin (VANCOCIN) 500 mg in sodium chloride 0.9 % 100 mL IVPB (not administered)  piperacillin-tazobactam (ZOSYN) IVPB 3.375 g (3.375 g Intravenous New Bag/Given 04/05/16 1502)  methylPREDNISolone sodium succinate (SOLU-MEDROL) 125 mg/2 mL injection 60 mg (60 mg Intravenous Given 04/05/16 1505)  arformoterol (BROVANA) nebulizer solution 15 mcg (15 mcg Nebulization Given 04/05/16 1441)  albuterol (PROVENTIL) (2.5 MG/3ML) 0.083% nebulizer solution 2.5 mg (not administered)   sodium chloride HYPERTONIC 3 % nebulizer solution 4 mL (4 mLs Nebulization Given 04/05/16 1440)  mycophenolate (CELLCEPT) capsule 1,000 mg (not administered)  atorvastatin (LIPITOR) tablet 10 mg (not administered)  tadalafil (PAH) (ADCIRCA) tablet 40 mg (not administered)  Treprostinil (TYVASO) inhalation solution 18 mcg (18 mcg Inhalation Not Given 04/05/16 1517)  furosemide (LASIX) tablet 40 mg (not administered)  oxyCODONE (Oxy IR/ROXICODONE) immediate release tablet 5 mg (5 mg Oral Given 04/05/16 1508)  pantoprazole (PROTONIX) EC tablet 40 mg (40 mg Oral Given 04/05/16 1507)  potassium chloride SA (K-DUR,KLOR-CON) CR tablet 20 mEq (20 mEq Oral Given 04/05/16 1508)  guaiFENesin (MUCINEX) 12 hr tablet 600 mg (600 mg Oral Given 04/05/16 1509)  saccharomyces boulardii (FLORASTOR) capsule 250 mg (not administered)  gabapentin (NEURONTIN) capsule 400 mg (400 mg Oral Given 04/05/16 1507)  lidocaine (LIDODERM) 5 % 1 patch (not administered)  DULoxetine (CYMBALTA) DR capsule 60 mg (not administered)  enoxaparin (LOVENOX) injection 40 mg (not administered)  sodium chloride flush (NS) 0.9 % injection 3 mL (3 mLs Intravenous Not Given 04/05/16 1501)  0.9 %  sodium chloride infusion ( Intravenous New Bag/Given 04/05/16 1501)  acetaminophen (TYLENOL) tablet 650 mg (not administered)    Or  acetaminophen (TYLENOL) suppository 650 mg (not administered)  promethazine (PHENERGAN) tablet 12.5 mg (not administered)  ipratropium-albuterol (DUONEB) 0.5-2.5 (3) MG/3ML nebulizer solution 3 mL (not administered)  budesonide (PULMICORT) nebulizer solution 0.5 mg (0.5 mg Nebulization Given 04/05/16 1441)  ipratropium (ATROVENT) nebulizer solution 1 mg (1 mg Nebulization Given 04/05/16 0910)  albuterol (PROVENTIL) (2.5 MG/3ML) 0.083% nebulizer solution 15 mg (15 mg Nebulization Given 04/05/16 0913)  hydrocortisone sodium succinate (SOLU-CORTEF) 100 MG injection 100 mg (100 mg Intravenous Given 04/05/16 0935)  vancomycin  (VANCOCIN) IVPB 1000 mg/200 mL premix (0 mg Intravenous Stopped 04/05/16 1218)  piperacillin-tazobactam (ZOSYN) IVPB 3.375 g (0 g Intravenous Stopped 04/05/16 1028)  sodium chloride 0.9 % bolus 500 mL (0 mLs Intravenous Stopped 04/05/16 1028)     Initial Impression / Assessment and Plan / ED Course  I have reviewed the triage vital signs and the nursing notes.  Pertinent labs & imaging results that were available during my care of the patient were reviewed by me and considered in my medical decision making (see chart for details).     Patient is a 66 year old female with history as above who presents with shortness of breath. EMS reports the patient had had about 3 days of progressive shortness of breath, acutely worse this morning. Patient was cyanotic upon their arrival and she was placed on CPAP. Patient was given albuterol and the larger as well as Solu-Medrol. IV was lost prior to arrival.  Upon arrival, patient has rapidly increased work of breathing. She is still on CPAP. She does have peripheral cyanosis. She was transferred to the ED bed where IV access was established. She was transferred to our BiPAP machine. Over the next few minutes, she did have improvement in her sinuses. Oxygen saturation 100%. She is tachycardic in the 140s. EKG revealed ST elevation in aVL with reciprocal depression in the lateral leads. This appears to be new from prior, however could be related to her cardiac demand with his heart rate and work of breathing. Labs, chest x-ray, breathing treatments initiated.  Initial labs notable for lactic acid of 5.9, VBG with pH 7.19 and PCO2 83. BiPAP continued. Chest x-ray shows near whiteout of left lung as well as new infiltrates in the right upper and lower lobes. Patient has been on Levaquin for the last 5 days as outpatient treatment. She was given vancomycin and Zosyn in the ED. Patient's work of breathing, heart rate, and skin color continued to improve with treatment.  She will be admitted to the stepdown unit. Repeat lactic acid and and VBG show notable improvement with current treatment.  Final Clinical Impressions(s) / ED Diagnoses   Final diagnoses:  Respiratory failure [facility])    New Prescriptions New Prescriptions   No medications on file     Clifton James, MD 04/05/16 [place], MD 04/05/16 1611

## 2016-04-06 ENCOUNTER — Encounter (HOSPITAL_COMMUNITY)
Admission: RE | Admit: 2016-04-06 | Discharge: 2016-04-06 | Disposition: A | Discharge status: Home / Self Care | Payer: BLUE CROSS/BLUE SHIELD | Source: Ambulatory Visit | Attending: Internal Medicine | Admitting: Internal Medicine

## 2016-04-06 ENCOUNTER — Inpatient Hospital Stay (HOSPITAL_COMMUNITY): Payer: BLUE CROSS/BLUE SHIELD

## 2016-04-06 DIAGNOSIS — D3A090 Benign carcinoid tumor of the bronchus and lung: Secondary | ICD-10-CM

## 2016-04-06 DIAGNOSIS — G894 Chronic pain syndrome: Secondary | ICD-10-CM

## 2016-04-06 DIAGNOSIS — R0603 Acute respiratory distress: Secondary | ICD-10-CM

## 2016-04-06 DIAGNOSIS — B974 Respiratory syncytial virus as the cause of diseases classified elsewhere: Secondary | ICD-10-CM

## 2016-04-06 DIAGNOSIS — B338 Other specified viral diseases: Secondary | ICD-10-CM

## 2016-04-06 DIAGNOSIS — J9600 Acute respiratory failure, unspecified whether with hypoxia or hypercapnia: Secondary | ICD-10-CM

## 2016-04-06 DIAGNOSIS — Z902 Acquired absence of lung [part of]: Secondary | ICD-10-CM

## 2016-04-06 LAB — GLUCOSE, CAPILLARY
Glucose-Capillary: 152 mg/dL — ABNORMAL HIGH (ref 65–99)
Glucose-Capillary: 157 mg/dL — ABNORMAL HIGH (ref 65–99)
Glucose-Capillary: 202 mg/dL — ABNORMAL HIGH (ref 65–99)

## 2016-04-06 LAB — HIV ANTIBODY (ROUTINE TESTING W REFLEX): HIV Screen 4th Generation wRfx: NONREACTIVE

## 2016-04-06 LAB — CBC
HCT: 28.6 % — ABNORMAL LOW (ref 36.0–46.0)
Hemoglobin: 8.6 g/dL — ABNORMAL LOW (ref 12.0–15.0)
MCH: 26.7 pg (ref 26.0–34.0)
MCHC: 30.1 g/dL (ref 30.0–36.0)
MCV: 88.8 fL (ref 78.0–100.0)
Platelets: 279 K/uL (ref 150–400)
RBC: 3.22 MIL/uL — ABNORMAL LOW (ref 3.87–5.11)
RDW: 16.1 % — ABNORMAL HIGH (ref 11.5–15.5)
WBC: 4.5 K/uL (ref 4.0–10.5)

## 2016-04-06 LAB — BASIC METABOLIC PANEL
Anion gap: 13 (ref 5–15)
BUN: 15 mg/dL (ref 6–20)
CHLORIDE: 95 mmol/L — AB (ref 101–111)
CO2: 33 mmol/L — AB (ref 22–32)
Calcium: 8.9 mg/dL (ref 8.9–10.3)
Creatinine, Ser: 0.65 mg/dL (ref 0.44–1.00)
GFR calc non Af Amer: >60 mL/min (ref >60)
Glucose, Bld: 128 mg/dL — ABNORMAL HIGH (ref 65–99)
Potassium: 4.3 mmol/L (ref 3.5–5.1)
Sodium: 141 mmol/L (ref 135–145)

## 2016-04-06 LAB — MRSA PCR SCREENING: MRSA by PCR: NEGATIVE

## 2016-04-06 LAB — TROPONIN I: Troponin I: 0.33 ng/mL (ref <0.03)

## 2016-04-06 MED ORDER — TREPROSTINIL 0.6 MG/ML IN SOLN
18.0000 ug | Freq: Four times a day (QID) | RESPIRATORY_TRACT | Status: DC
  Filled 2016-04-06 (×34): qty 0.03

## 2016-04-06 MED ORDER — ACETYLCYSTEINE 20 % IN SOLN
4.0000 mL | Freq: Two times a day (BID) | RESPIRATORY_TRACT | Status: AC
  Administered 2016-04-06 – 2016-04-07 (×4): 4 mL via RESPIRATORY_TRACT
  Filled 2016-04-06 (×4): qty 4

## 2016-04-06 MED ORDER — TREPROSTINIL 0.6 MG/ML IN SOLN
18.0000 ug | Freq: Four times a day (QID) | RESPIRATORY_TRACT | Status: DC
  Administered 2016-04-06 – 2016-04-09 (×7): 18 ug via RESPIRATORY_TRACT
  Filled 2016-04-06 (×12): qty 0.03

## 2016-04-06 NOTE — Progress Notes (Signed)
PROGRESS NOTE    Deborah [name]  QMV:[id number] DOB: 08/16/1950 DOA: 04/05/2016 PCP: Jerlyn Ly, MD   Brief Narrative:   66 y.o. WF PMHX  C. 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 6 days ago patient began to feel ill with no specific focal complaints. 5 days ago patient developed cough and fever with sputum production. Patient was standing order to start Levaquin 1 over the symptoms begin. Start Levaquin that evening. Possible mild improvement over the next 3-4 days and able to maintain O2 saturations on baseline oxygen titration of 4-6 L. Continue to have intermittent fevers during this time as high as 101.5. Marked worsening of respiratory status over the last 24 hours prompting family to call EMS who placed patient on CPAP and brought her emergently to [facility] workplace and was placed on BiPAP in the ED. Denies diarrhea, abdominal pain, chest pain, palpitations, nausea, vomiting, rash, neck stiffness, headache.  History provided predominantly by patient's husband as patient is on BiPAP and unable to provide significant portion of the information. Patient is awake and alert however and able to communicate by nodding her head and minimal speech.  ED Course: Objective findings outlined below. Patient placed on BiPAP. Pulmonology consulted for evaluation and patient started on broad-spectrum antibiotics and given steroids.   Subjective: 2/13 A/O 4 states she normally has her pulmonary testing/treatment at [facility]. However lives in [place] and would like to establish with pulmonologist here. States normally on 3-4 L O2 when feeling well however, starting in~October 2017 began have slow deterioration. Prior to admission O2  demand was 4 L O2 when sedentary and 6 L when active. Patient stated last cardiac catheterization~2 years ago review of her chart in care everywhere shows last cardiac catheterization 11/07/2013.    Assessment & Plan:   Active Problems:   Acute respiratory failure (HCC)   Carcinoid tumor of left lung   S/P partial lobectomy of lung   Chronic pain   Acute on chronic respiratory failure with hypoxia and hypercapnia (HCC)   Hyperglycemia   Sepsis (HCC)     Acute on chronic respiratory failure:/ positive Respiratory Syncytial Virus -Multifactorial HCAP, bronchiectasis, ILD, restrictive lung disease. -Treatment per [facility] M  ILD  CREST  Carcinoid tumor left lung   Chronic pain: - continue Oxy-IR, Neurontin, Lidocaine patch  Tachycardia:  Sinus. EKG read as above. Unchanged from previous. Suspect from dehydration and infection - Cycle Trop - EKG in am -Echocardiogram pending  Hyperglycemia:  -Resolved   Depression: - continue cymbalta  HLD: - continue statin    DVT prophylaxis: Lovenox Code Status: Full Family Communication: None Disposition Plan: Per [facility] M   Consultants:  [facility] M    Procedures/Significant Events:  Echocardiogram pending   VENTILATOR SETTINGS:    Cultures 2/122 NGTD 2/12 positive Respiratory Syncytial Virus   Antimicrobials: Anti-infectives    Start     Stop   04/05/16 2100  vancomycin (VANCOCIN) 500 mg in sodium chloride 0.9 % 100 mL IVPB         04/05/16 1400  piperacillin-tazobactam (ZOSYN) IVPB 3.375 g         04/05/16 0930  vancomycin (VANCOCIN) IVPB 1000 mg/200 mL premix     04/05/16 1218   04/05/16 0930  piperacillin-tazobactam (ZOSYN) IVPB 3.375 g  04/05/16 1028       Devices    LINES / TUBES:      Continuous Infusions: . sodium chloride 75 mL/hr at 04/06/16 0523     Objective: Vitals:   04/05/16 2340 04/06/16 0340 04/06/16 0400 04/06/16 0720  BP: (!) 146/68 140/63    Pulse: 91 88 85 80    Resp: (!) 29 (!) 23 (!) 24 18  Temp:  98.7 F (37.1 C)    TempSrc:  Oral  Oral  SpO2: 100% 100% 100% 100%  Weight:      Height:        Intake/Output Summary (Last 24 hours) at 04/06/16 0908 Last data filed at 04/06/16 0700  Gross per 24 hour  Intake          2123.75 ml  Output             4650 ml  Net         -2526.25 ml   Filed Weights   04/05/16 2025  Weight: 53.8 kg (118 lb 9.7 oz)    Examination:  General: A/O 4 acute on chronic respiratory distress, cachectic Eyes: negative scleral hemorrhage, negative anisocoria, negative icterus ENT: Negative Runny nose, negative gingival bleeding, Neck:  Negative scars, masses, torticollis, lymphadenopathy, JVD Lungs: tachypnea, diffuse poor air movement, with diffuse expiratory wheeze, mild by basilar crackles Cardiovascular: Tachycardic, Regular rhythm without murmur gallop or rub normal S1 and S2 Abdomen: negative abdominal pain, nondistended, positive soft, bowel sounds, no rebound, no ascites, no appreciable mass Extremities: No significant cyanosis, clubbing, or edema bilateral lower extremities Skin: Negative rashes, lesions, ulcers Psychiatric:  Negative depression, negative anxiety, negative fatigue, negative mania  Central nervous system:  Cranial nerves II through XII intact, tongue/uvula midline, all extremities muscle strength 5/5, sensation intact throughout, negative dysarthria, negative expressive aphasia, negative receptive aphasia.  .     Data Reviewed: Care during the described time interval was provided by me .  I have reviewed this patient's available data, including medical history, events of note, physical examination, and all test results as part of my evaluation. I have personally reviewed and interpreted all radiology studies.  CBC:  Recent Labs Lab 04/05/16 0842 04/05/16 0904 04/06/16 0112  WBC 10.5  --  4.5  NEUTROABS 7.2  --   --   HGB 9.2* 10.5* 8.6*  HCT 31.6* 31.0* 28.6*  MCV 91.6  --  88.8   PLT 385  --  631   Basic Metabolic Panel:  Recent Labs Lab 04/05/16 0842 04/05/16 0904 04/05/16 1252 04/06/16 0112  NA 139 138  --  141  K 4.6 4.5  --  4.3  CL 97* 98*  --  95*  CO2 27  --   --  33*  GLUCOSE 269* 267*  --  128*  BUN 16 23*  --  15  CREATININE 0.88 0.80  --  0.65  CALCIUM 9.2  --   --  8.9  MG  --   --  1.7  --   PHOS  --   --  4.2  --    GFR: Estimated Creatinine Clearance: 61.1 mL/min (by C-G formula based on SCr of 0.65 mg/dL). Liver Function Tests:  Recent Labs Lab 04/05/16 0842  AST 34  ALT 26  ALKPHOS 94  BILITOT 0.5  PROT 6.7  ALBUMIN 3.4*   No results for input(s): LIPASE, AMYLASE in the last 168 hours. No results for input(s): AMMONIA in the last 168 hours. Coagulation Profile:  No results for input(s): INR, PROTIME in the last 168 hours. Cardiac Enzymes:  Recent Labs Lab 04/05/16 1252 04/05/16 1922 04/06/16 0112  TROPONINI 0.49* 0.48* 0.33*   BNP (last 3 results) No results for input(s): PROBNP in the last 8760 hours. HbA1C: No results for input(s): HGBA1C in the last 72 hours. CBG:  Recent Labs Lab 04/05/16 2200 04/06/16 0734  GLUCAP 147* 152*   Lipid Profile: No results for input(s): CHOL, HDL, LDLCALC, TRIG, CHOLHDL, LDLDIRECT in the last 72 hours. Thyroid Function Tests: No results for input(s): TSH, T4TOTAL, FREET4, T3FREE, THYROIDAB in the last 72 hours. Anemia Panel: No results for input(s): VITAMINB12, FOLATE, FERRITIN, TIBC, IRON, RETICCTPCT in the last 72 hours. Urine analysis:    Component Value Date/Time   COLORURINE YELLOW 06/03/2012 2131   APPEARANCEUR CLEAR 06/03/2012 2131   LABSPEC 1.010 06/03/2012 2131   PHURINE 6.5 06/03/2012 2131   GLUCOSEU NEGATIVE 06/03/2012 2131   HGBUR NEGATIVE 06/03/2012 2131   HGBUR negative 02/25/2010 0917   BILIRUBINUR NEGATIVE 06/03/2012 2131   BILIRUBINUR n 03/01/2011 1110   KETONESUR NEGATIVE 06/03/2012 2131   PROTEINUR NEGATIVE 06/03/2012 2131   UROBILINOGEN 0.2  06/03/2012 2131   NITRITE NEGATIVE 06/03/2012 2131   LEUKOCYTESUR NEGATIVE 06/03/2012 2131   Sepsis Labs: _0 (procalcitonin:4,lacticidven:4)  ) Recent Results (from the past 240 hour(s))  Blood culture (routine x 2)     Status: None (Preliminary result)   Collection Time: 04/05/16  9:14 AM  Result Value Ref Range Status   Specimen Description BLOOD RIGHT ANTECUBITAL  Final   Special Requests BOTTLES DRAWN AEROBIC ONLY  5CC  Final   Culture PENDING  Incomplete   Report Status PENDING  Incomplete  Respiratory Panel by PCR     Status: Abnormal   Collection Time: 04/05/16 12:42 PM  Result Value Ref Range Status   Adenovirus NOT DETECTED NOT DETECTED Final   Coronavirus 229E NOT DETECTED NOT DETECTED Final   Coronavirus HKU1 NOT DETECTED NOT DETECTED Final   Coronavirus NL63 NOT DETECTED NOT DETECTED Final   Coronavirus OC43 NOT DETECTED NOT DETECTED Final   Metapneumovirus NOT DETECTED NOT DETECTED Final   Rhinovirus / Enterovirus NOT DETECTED NOT DETECTED Final   Influenza A NOT DETECTED NOT DETECTED Final   Influenza B NOT DETECTED NOT DETECTED Final   Parainfluenza Virus 1 NOT DETECTED NOT DETECTED Final   Parainfluenza Virus 2 NOT DETECTED NOT DETECTED Final   Parainfluenza Virus 3 NOT DETECTED NOT DETECTED Final   Parainfluenza Virus 4 NOT DETECTED NOT DETECTED Final   Respiratory Syncytial Virus DETECTED (A) NOT DETECTED Final    Comment: CRITICAL RESULT CALLED TO, READ BACK BY AND VERIFIED WITH: Milon Dikes RN 17:20 04/05/16 ([email or web address])    Bordetella pertussis NOT DETECTED NOT DETECTED Final   Chlamydophila pneumoniae NOT DETECTED NOT DETECTED Final   Mycoplasma pneumoniae NOT DETECTED NOT DETECTED Final  MRSA PCR Screening     Status: None   Collection Time: 04/05/16  8:09 PM  Result Value Ref Range Status   MRSA by PCR NEGATIVE NEGATIVE Final    Comment:        The GeneXpert MRSA Assay (FDA approved for NASAL specimens only), is one component of a comprehensive  MRSA colonization surveillance program. It is not intended to diagnose MRSA infection nor to guide or monitor treatment for MRSA infections.          Radiology Studies: Dg Chest 2 View  Result Date: 04/06/2016 CLINICAL DATA:  Shortness of breath. EXAM: CHEST  2 VIEW COMPARISON:  04/05/2016 .  09/18/2013 . FINDINGS: Prior Postsurgical changes left lung. Near complete opacification left hemithorax again noted. Probable small chronic left hydrothorax again noted. Chronic interstitial changes right lung. Heart size stable. Prior left rib resections. IMPRESSION: Prior postsurgical changes left lung. Near complete opacification of the left hemithorax is again noted, no change from 04/05/2016. Findings consistent with atelectasis/ consolidation in the remaining left lung and left pleural effusion. Probable small chronic left hydrothorax is again noted. Electronically Signed   By: Marcello Moores  Register   On: 04/06/2016 09:04   Dg Chest Port 1 View  Result Date: 04/05/2016 CLINICAL DATA:  Dyspnea, on BiPAP EXAM: PORTABLE CHEST 1 VIEW COMPARISON:  09/18/2013 FINDINGS: There is worsening in aeration with almost complete opacification of the left hemithorax. The patient is status post partial left lung resection. There is atelectasis or infiltrate in the remaining left lung. Probable small left hydro thorax. There is patchy airspace of opacification in right upper and lower lobe suspicious for superimposed pneumonitis or infiltrate. IMPRESSION: Almost complete opacification of the left hemithorax. Status post partial left lung resection. Atelectasis or infiltrate in the remaining left lung. Probable small left hydro thorax. Patchy pneumonitis or infiltrate in right upper and right lower lobe. Electronically Signed   By: Lahoma Crocker M.D.   On: 04/05/2016 09:18        Scheduled Meds: . arformoterol  15 mcg Nebulization BID  . atorvastatin  10 mg Oral Daily  . budesonide (PULMICORT) nebulizer solution  0.5  mg Nebulization BID  . DULoxetine  60 mg Oral QPM  . enoxaparin (LOVENOX) injection  40 mg Subcutaneous Q24H  . furosemide  40 mg Oral Once per day on Mon Wed Fri  . gabapentin  400 mg Oral QID  . guaiFENesin  600 mg Oral BID  . lidocaine  1 patch Transdermal Q24H  . mouth rinse  15 mL Mouth Rinse BID  . methylPREDNISolone (SOLU-MEDROL) injection  60 mg Intravenous Q8H  . mycophenolate  1,000 mg Oral BID  . pantoprazole  40 mg Oral Daily  . piperacillin-tazobactam (ZOSYN)  IV  3.375 g Intravenous Q8H  . potassium chloride SA  20 mEq Oral Once per day on Mon Wed Fri  . saccharomyces boulardii  250 mg Oral BID  . sodium chloride flush  3 mL Intravenous Q12H  . sodium chloride HYPERTONIC  4 mL Nebulization Daily  . tadalafil (PAH)  40 mg Oral QHS  . Treprostinil  18 mcg Inhalation QID  . vancomycin  500 mg Intravenous Q12H   Continuous Infusions: . sodium chloride 75 mL/hr at 04/06/16 0523     LOS: 1 day    Time spent: 40 minutes    Priya Matsen, Geraldo Docker, MD [facility] Pager [phone]   If 7PM-7AM, please contact night-coverage [email or web address] Password [email or web address] 04/06/2016, 9:08 AM

## 2016-04-06 NOTE — Progress Notes (Signed)
Patient performed flutter valve with husband. Will began manual CPT at next scheduled time.

## 2016-04-06 NOTE — Consult Note (Signed)
Name: Deborah [name] MRN: [id number] DOB: 11-16-1950    ADMISSION DATE:  04/05/2016 CONSULTATION DATE:  04/05/16  REFERRING MD :  Melina Copa - EDP  CHIEF COMPLAINT:  SOB   BRIEF SUMMARY: 66 y/o F with PMH of carcinoid tumor of the left lung s/p resection in 1884 complicated by BP fistula s/p repair, ILD, severe bronchiectasis and CREST admitted 2/12 with acute on chronic hypoxic respiratory failure in the setting of suspected bronchiectasis exacerbation.   She sees Dr. Corrin Parker at [facility] and had admission to [facility] in November 2017 with parainfluenza 1.  During that admission, it was felt that she was experiencing silent aspiration (1 swallow eval demonstrated aspiration while the 2nd did not.  Both studies did demonstrate esophageal dysmotility however).  Latest PFT's from May 2017:  FVC 1.32 (42% pred), FEV1 0.96 (39% pred), ratio 72.55%, TLC 36% pred, DLCO 5.35 (28%).   SUBJECTIVE:  Pt reports feeling better today.  Continues to have some sinus pressure / decreased hearing.   VITAL SIGNS: Temp:  [97.4 F (36.3 C)-98.7 F (37.1 C)] 98.7 F (37.1 C) (02/13 0340) Pulse Rate:  [80-124] 80 (02/13 0720) Resp:  [18-32] 18 (02/13 0720) BP: (106-149)/(55-91) 140/63 (02/13 0340) SpO2:  [93 %-100 %] 100 % (02/13 0720) FiO2 (%):  [100 %] 100 % (02/12 0913) Weight:  [118 lb 9.7 oz (53.8 kg)] 118 lb 9.7 oz (53.8 kg) (02/12 2025)  PHYSICAL EXAMINATION: General:  Chronically ill appearing female in NAD HEENT: MM pink/moist, good dentition  PSY: anxious Neuro: AAOx4, speech clear, MAE CV: s1s2 rrr, no m/r/g PULM: even/non-labored, crackles posterior lower right, bronchial breath sounds on left ZY:SAYT, non-tender, bsx4 active  Extremities: warm/dry, no edema  Skin: no rashes or lesions    Recent Labs Lab 04/05/16 0842 04/05/16 0904 04/06/16 0112  NA 139 138 141  K 4.6 4.5 4.3  CL 97* 98* 95*  CO2 27  --  33*  BUN 16 23* 15  CREATININE 0.88 0.80 0.65  GLUCOSE 269* 267* 128*    Recent  Labs Lab 04/05/16 0842 04/05/16 0904 04/06/16 0112  HGB 9.2* 10.5* 8.6*  HCT 31.6* 31.0* 28.6*  WBC 10.5  --  4.5  PLT 385  --  279   Dg Chest Port 1 View  Result Date: 04/05/2016 CLINICAL DATA:  Dyspnea, on BiPAP EXAM: PORTABLE CHEST 1 VIEW COMPARISON:  09/18/2013 FINDINGS: There is worsening in aeration with almost complete opacification of the left hemithorax. The patient is status post partial left lung resection. There is atelectasis or infiltrate in the remaining left lung. Probable small left hydro thorax. There is patchy airspace of opacification in right upper and lower lobe suspicious for superimposed pneumonitis or infiltrate. IMPRESSION: Almost complete opacification of the left hemithorax. Status post partial left lung resection. Atelectasis or infiltrate in the remaining left lung. Probable small left hydro thorax. Patchy pneumonitis or infiltrate in right upper and right lower lobe. Electronically Signed   By: Lahoma Crocker M.D.   On: 04/05/2016 09:18    STUDIES:  CXR 02/12 > almost complete opacification left lung (chronic changes), atelectasis.  SIGNIFICANT EVENTS: 2/12 Admit with AonC hypoxic respiratory failure  ANTIBIOTICS: Vanco 2/12 >>  Zosyn 2/12 >>  ASSESSMENT / PLAN:  Acute on chronic hypoxic + acute hypercarbic respiratory failure - multifactorial in the setting of known bronchiectasis, severe restrictive lung disease, ILD.   Must also consider opportunistic infection.   Severe bronchiectasis - chronic; CXR now shows evolution / worsening of known left  lung disease and CT from [facility] reportedly suggests possible BTX in right base as well. Severe restrictive lung disease with severely reduced diffusion capacity. Atelectasis. Probable Chronic L Hydropneumothorax  Pulmonary Hypertension associated with Connective Tissue Disease  Intermittent Silent Aspiration   Plan: Wean O2 for sats >92%, baseline 4L at rest, 6 with exertion PRN BiPAP if needed for increased  work of breathing  Continue empiric abx, D2/x  Consider d/c vanco after 48 hours  Follow cultures Continue flutter valve  Continue budesonide / brovana, hold preadmit dulera Solumedrol 60 mg IV Q8, baseline prednisone 43m QD Continue inhaled hypertonic saline  Intermittent CXR Continue preadmit cellcept (2000 [facility]D [facility] PRN albuterol  Consider follow up with Dr [place] outpatient to establish local pulmonary relationship for emergent needs. May need rotating abx in the near future    BNoe Gens NP-C [place] [facility] Pgr: [phone] or if no answer [phone]/13/2018, 8:43 AM   STAFF NOTE: I, DMerrie Roof MD FACP have personally reviewed patient's available data, including medical history, events of note, physical examination and test results as part of my evaluation. I have discussed with resident/NP and other care providers such as pharmacist, RN and RRT. In addition, I personally evaluated patient and elicited key findings of: awake, on neb, no distress, reduced BS rt, no egophony, coarse left, reviewed all prior pcxr for years back, presentation now is RSV Pneumonitis with lef tlung ATX, r/o superinfection, we need to open this left lung asap for fear of worsening pna, add back scheduled NIMV, pcx rin am , add muocmysts to flutter and add chest pt as she tolerates, pcx rin am , if she remains collapse will Ct chest in am , maintain steroids for rsv and follow culture sputum, likley dc vanc in am, I updated pt and husband in full  DLavon Paganini FTitus Mould MD, FGlendalePgr: 3[facility] 04/06/2016 9:35 AM

## 2016-04-06 NOTE — Care Management Note (Signed)
Case Management Note  Patient Details  Name: Deborah [name] MRN: [id number] Date of Birth: [birth year]  Subjective/Objective:    Adm w resp failure                Action/Plan:lives at home, uses o2   Expected Discharge Date:                  Expected Discharge Plan:  [place]  In-House Referral:     Discharge planning Services     Post Acute Care Choice:    Choice offered to:     DME Arranged:    DME Agency:     HH Arranged:    [place] Agency:     Status of Service:  In process, will continue to follow  If discussed at Long Length of Stay Meetings, dates discussed:    Additional Comments:lives w fam, following for dc needs.  Lacretia Leigh, RN 04/06/2016, 8:56 AM

## 2016-04-07 ENCOUNTER — Inpatient Hospital Stay (HOSPITAL_COMMUNITY): Payer: BLUE CROSS/BLUE SHIELD

## 2016-04-07 ENCOUNTER — Encounter (HOSPITAL_COMMUNITY): Payer: BLUE CROSS/BLUE SHIELD

## 2016-04-07 DIAGNOSIS — R0600 Dyspnea, unspecified: Secondary | ICD-10-CM

## 2016-04-07 DIAGNOSIS — I509 Heart failure, unspecified: Secondary | ICD-10-CM

## 2016-04-07 LAB — HEMOGLOBIN A1C
Hgb A1c MFr Bld: 5.5 % (ref 4.8–5.6)
Mean Plasma Glucose: 111 mg/dL

## 2016-04-07 LAB — BASIC METABOLIC PANEL
Anion gap: 8 (ref 5–15)
BUN: 23 mg/dL — AB (ref 6–20)
CO2: 34 mmol/L — ABNORMAL HIGH (ref 22–32)
Calcium: 8.8 mg/dL — ABNORMAL LOW (ref 8.9–10.3)
Chloride: 98 mmol/L — ABNORMAL LOW (ref 101–111)
Creatinine, Ser: 0.67 mg/dL (ref 0.44–1.00)
Glucose, Bld: 129 mg/dL — ABNORMAL HIGH (ref 65–99)
POTASSIUM: 3.8 mmol/L (ref 3.5–5.1)
Sodium: 140 mmol/L (ref 135–145)

## 2016-04-07 LAB — LACTIC ACID, PLASMA: LACTIC ACID, VENOUS: 0.7 mmol/L (ref 0.5–1.9)

## 2016-04-07 LAB — ECHOCARDIOGRAM COMPLETE
Height: 65 in
WEIGHTICAEL: 1897.72 [oz_av]

## 2016-04-07 LAB — LEGIONELLA PNEUMOPHILA SEROGP 1 UR AG: L. PNEUMOPHILA SEROGP 1 UR AG: NEGATIVE

## 2016-04-07 LAB — PROCALCITONIN: PROCALCITONIN: 0.85 ng/mL

## 2016-04-07 LAB — MAGNESIUM: MAGNESIUM: 2.1 mg/dL (ref 1.7–2.4)

## 2016-04-07 MED ORDER — ALPRAZOLAM 0.25 MG PO TABS
0.2500 mg | ORAL_TABLET | Freq: Two times a day (BID) | ORAL | Status: DC | PRN
  Administered 2016-04-08 – 2016-04-12 (×8): 0.25 mg via ORAL
  Filled 2016-04-07 (×8): qty 1

## 2016-04-07 MED ORDER — CLINDAMYCIN PHOSPHATE 600 MG/50ML IV SOLN
600.0000 mg | Freq: Three times a day (TID) | INTRAVENOUS | Status: DC
  Administered 2016-04-07 – 2016-04-08 (×2): 600 mg via INTRAVENOUS
  Filled 2016-04-07 (×3): qty 50

## 2016-04-07 MED ORDER — DEXTROSE 5 % IV SOLN
1.0000 g | INTRAVENOUS | Status: DC
  Filled 2016-04-07: qty 10

## 2016-04-07 MED ORDER — DEXTROSE 5 % IV SOLN
2.0000 g | INTRAVENOUS | Status: DC
  Administered 2016-04-07 – 2016-04-08 (×2): 2 g via INTRAVENOUS
  Filled 2016-04-07 (×3): qty 2

## 2016-04-07 NOTE — Progress Notes (Signed)
Per husband he was giving her hot coffee when she started coughing and "couldn't catch her breath" placed patient on BiPAP RR improved from 55 BPM, to 31 BPM and SATS improved to 99% from 79% when I first walked in the room, will continue to monitor patient.

## 2016-04-07 NOTE — Progress Notes (Signed)
Patient refused 12:00 CPT

## 2016-04-07 NOTE — Progress Notes (Signed)
Pt reports to this RT she does not like the bipap and does not want to wear it. This RT explained to pt that order states for PRN use and she should use it if feeling SOB or has increased WOB. Pt states she understands and will call if needed. Pt on 5L HFNC with no increased wob, no resp distress, pt denies sob. RN aware. RT will continue to monitor.

## 2016-04-07 NOTE — Progress Notes (Signed)
  Echocardiogram 2D Echocardiogram has been performed.  Wade Sigala L Androw 04/07/2016, 11:06 AM

## 2016-04-07 NOTE — Consult Note (Signed)
Name: Deborah [name] MRN: [id number] DOB: [birth year]    ADMISSION DATE:  04/05/2016 CONSULTATION DATE:  04/05/16  REFERRING MD :  Melina Copa - EDP  CHIEF COMPLAINT:  SOB   BRIEF SUMMARY: 66 y/o F with PMH of carcinoid tumor of the left lung s/p resection in 0347 complicated by BP fistula s/p repair, ILD, severe bronchiectasis and CREST admitted 2/12 with acute on chronic hypoxic respiratory failure in the setting of suspected bronchiectasis exacerbation.   She sees Dr. Corrin Parker at [facility] and had admission to [facility] in November 2017 with parainfluenza 1.  During that admission, it was felt that she was experiencing silent aspiration (1 swallow eval demonstrated aspiration while the 2nd did not.  Both studies did demonstrate esophageal dysmotility however).  Latest PFT's from May 2017:  FVC 1.32 (42% pred), FEV1 0.96 (39% pred), ratio 72.55%, TLC 36% pred, DLCO 5.35 (28%).   SUBJECTIVE:  Some coughing , bipap helped, neg 910 cc  VITAL SIGNS: Temp:  [97.6 F (36.4 C)-98.6 F (37 C)] 97.8 F (36.6 C) (02/14 0735) Pulse Rate:  [95-113] 95 (02/14 0735) Resp:  [21-30] 21 (02/14 0735) BP: (131-179)/(66-80) 179/77 (02/14 0735) SpO2:  [94 %-100 %] 100 % (02/14 0753) FiO2 (%):  [40 %] 40 % (02/13 1128)  PHYSICAL EXAMINATION: General:  Chronically ill appearing female in NAD HEENT: MM pink/moist, good dentition  PSY: anxious, increased Neuro: AAOx4, speech clear, MAE CV: s1s2 rrr, no m/r/g PULM:no distress, maybe some improved aeration left upper QQ:VZDG, non-tender, bsx4 active  Extremities: warm/dry, no edema  Skin: no rashes or lesions    Recent Labs Lab 04/05/16 0842 04/05/16 0904 04/06/16 0112 04/07/16 0531  NA 139 138 141 140  K 4.6 4.5 4.3 3.8  CL 97* 98* 95* 98*  CO2 27  --  33* 34*  BUN 16 23* 15 23*  CREATININE 0.88 0.80 0.65 0.67  GLUCOSE 269* 267* 128* 129*    Recent Labs Lab 04/05/16 0842 04/05/16 0904 04/06/16 0112  HGB 9.2* 10.5* 8.6*  HCT 31.6* 31.0* 28.6*   WBC 10.5  --  4.5  PLT 385  --  279   Dg Chest 2 View  Result Date: 04/06/2016 CLINICAL DATA:  Shortness of breath. EXAM: CHEST  2 VIEW COMPARISON:  04/05/2016 .  09/18/2013 . FINDINGS: Prior Postsurgical changes left lung. Near complete opacification left hemithorax again noted. Probable small chronic left hydrothorax again noted. Chronic interstitial changes right lung. Heart size stable. Prior left rib resections. IMPRESSION: Prior postsurgical changes left lung. Near complete opacification of the left hemithorax is again noted, no change from 04/05/2016. Findings consistent with atelectasis/ consolidation in the remaining left lung and left pleural effusion. Probable small chronic left hydrothorax is again noted. Electronically Signed   By: Marcello Moores  Register   On: 04/06/2016 09:04    STUDIES:  CXR 02/12 > almost complete opacification left lung (chronic changes), atelectasis.  SIGNIFICANT EVENTS: 2/12 Admit with AonC hypoxic respiratory failure  ANTIBIOTICS: Vanco 2/12 >> 2/14 Zosyn 2/12 >>2/14 Ceftraixone 2/14 >>>  ASSESSMENT / PLAN:  Acute on chronic hypoxic + acute hypercarbic respiratory failure - multifactorial in the setting of known bronchiectasis, severe restrictive lung disease, ILD.   Must also consider opportunistic infection.   Severe bronchiectasis - chronic; CXR now shows evolution / worsening of known left lung disease and CT from [facility] reportedly suggests possible BTX in right base as well. Severe restrictive lung disease with severely reduced diffusion capacity. Atelectasis. Probable Chronic L Hydropneumothorax  Pulmonary Hypertension  associated with Connective Tissue Disease  Intermittent Silent Aspiration   Plan: pcxr stat about unchanged NIMV to dc in 1 hour, she had a small aspiration episode or vocal cord irritation from coffee Continued steroids Culture neg, narrow off to ctx D/w husband, he will discuss further her wishes for dnr/dni, which I would  recommend NIMV , chest pt, mucomyst's further 24 hours CT chest as not resolving this left main obstruction Lasix tolerated, maitain  Extensive update to pt and husband May need NTS  Lavon Paganini. Titus Mould, MD, [place] Pgr: [facility] 04/07/2016 10:03 AM

## 2016-04-08 ENCOUNTER — Encounter (HOSPITAL_COMMUNITY)
Admission: RE | Admit: 2016-04-08 | Discharge: 2016-04-08 | Disposition: A | Discharge status: Home / Self Care | Payer: BLUE CROSS/BLUE SHIELD | Source: Ambulatory Visit | Attending: Internal Medicine | Admitting: Internal Medicine

## 2016-04-08 ENCOUNTER — Inpatient Hospital Stay (HOSPITAL_COMMUNITY): Payer: BLUE CROSS/BLUE SHIELD

## 2016-04-08 DIAGNOSIS — J471 Bronchiectasis with (acute) exacerbation: Principal | ICD-10-CM

## 2016-04-08 DIAGNOSIS — Z888 Allergy status to other drugs, medicaments and biological substances status: Secondary | ICD-10-CM

## 2016-04-08 DIAGNOSIS — J181 Lobar pneumonia, unspecified organism: Secondary | ICD-10-CM

## 2016-04-08 DIAGNOSIS — I272 Pulmonary hypertension, unspecified: Secondary | ICD-10-CM

## 2016-04-08 DIAGNOSIS — Z79899 Other long term (current) drug therapy: Secondary | ICD-10-CM

## 2016-04-08 DIAGNOSIS — J9819 Other pulmonary collapse: Secondary | ICD-10-CM

## 2016-04-08 LAB — BASIC METABOLIC PANEL
ANION GAP: 8 (ref 5–15)
BUN: 26 mg/dL — ABNORMAL HIGH (ref 6–20)
CALCIUM: 8.7 mg/dL — AB (ref 8.9–10.3)
CHLORIDE: 97 mmol/L — AB (ref 101–111)
CO2: 34 mmol/L — AB (ref 22–32)
Creatinine, Ser: 0.77 mg/dL (ref 0.44–1.00)
GFR calc non Af Amer: >60 mL/min (ref >60)
GLUCOSE: 140 mg/dL — AB (ref 65–99)
Potassium: 3.5 mmol/L (ref 3.5–5.1)
Sodium: 139 mmol/L (ref 135–145)

## 2016-04-08 LAB — MAGNESIUM: Magnesium: 2.1 mg/dL (ref 1.7–2.4)

## 2016-04-08 MED ORDER — METRONIDAZOLE 500 MG PO TABS
500.0000 mg | ORAL_TABLET | Freq: Three times a day (TID) | ORAL | Status: DC
  Administered 2016-04-08 – 2016-04-09 (×3): 500 mg via ORAL
  Filled 2016-04-08 (×3): qty 1

## 2016-04-08 MED ORDER — METHYLPREDNISOLONE SODIUM SUCC 40 MG IJ SOLR
40.0000 mg | Freq: Two times a day (BID) | INTRAMUSCULAR | Status: DC
  Administered 2016-04-08 – 2016-04-09 (×2): 40 mg via INTRAVENOUS
  Filled 2016-04-08 (×2): qty 1

## 2016-04-08 MED ORDER — FAMOTIDINE 20 MG PO TABS: 40.0000 mg | ORAL_TABLET | Freq: Every day | ORAL | Status: DC | PRN

## 2016-04-08 MED ORDER — GABAPENTIN 400 MG PO CAPS
400.0000 mg | ORAL_CAPSULE | Freq: Four times a day (QID) | ORAL | Status: DC
  Administered 2016-04-08 – 2016-04-10 (×8): 400 mg via ORAL
  Filled 2016-04-08 (×7): qty 1

## 2016-04-08 NOTE — Progress Notes (Signed)
Pulmonary Individual Treatment Plan  Patient Details  Name: Deborah [name] MRN: [id number] Date of Birth: 1950/03/20 Referring Provider:   [name] Pulmonary Rehab Walk Test from 02/12/2016 in [place]  Referring Provider  Dr. Nelda Marseille      Initial Encounter Date:  Flowsheet Row Pulmonary Rehab Walk Test from 02/12/2016 in [place]  Date  02/13/16  Referring Provider  Dr. Nelda Marseille      Visit Diagnosis: Pulmonary hypertension  Patient's Home Medications on Admission:  No current facility-administered medications for this encounter.  No current outpatient prescriptions on file.  Facility-Administered Medications Ordered in Other Encounters:  .  0.9 %  sodium chloride infusion, , Intravenous, Continuous, Raylene Miyamoto, MD, Last Rate: 10 mL/hr at 04/06/16 1600 .  acetaminophen (TYLENOL) tablet 650 mg, 650 mg, Oral, Q6H PRN **OR** acetaminophen (TYLENOL) suppository 650 mg, 650 mg, Rectal, Q6H PRN, Waldemar Dickens, MD .  albuterol (PROVENTIL) (2.5 MG/3ML) 0.083% nebulizer solution 2.5 mg, 2.5 mg, Nebulization, Q3H PRN, Rahul P Desai, PA-C, 2.5 mg at 04/07/16 1937 .  ALPRAZolam Duanne Moron) tablet 0.25 mg, 0.25 mg, Oral, BID PRN, Raylene Miyamoto, MD .  arformoterol [facility]) nebulizer solution 15 mcg, 15 mcg, Nebulization, BID, Rahul P Desai, PA-C, 15 mcg at 04/08/16 0824 .  atorvastatin (LIPITOR) tablet 10 mg, 10 mg, Oral, Daily, Waldemar Dickens, MD, 10 mg at 04/07/16 0909 .  budesonide (PULMICORT) nebulizer solution 0.5 mg, 0.5 mg, Nebulization, BID, Rahul P Desai, PA-C, 0.5 mg at 04/08/16 0824 .  cefTRIAXone (ROCEPHIN) 2 g in dextrose 5 % 50 mL IVPB, 2 g, Intravenous, Q24H, Cherene Altes, MD, 2 g at 04/07/16 1316 .  clindamycin (CLEOCIN) IVPB 600 mg, 600 mg, Intravenous, Q8H, Raylene Miyamoto, MD, 600 mg at 04/08/16 0548 .  DULoxetine (CYMBALTA) DR capsule 60 mg, 60 mg, Oral, QPM, Waldemar Dickens, MD, 60 mg  at 04/07/16 1726 .  enoxaparin (LOVENOX) injection 40 mg, 40 mg, Subcutaneous, Q24H, Waldemar Dickens, MD, 40 mg at 04/07/16 2215 .  furosemide (LASIX) tablet 40 mg, 40 mg, Oral, Once per day on Mon Wed Fri, Michael T Bitonti, RPH, 40 mg at 04/07/16 1107 .  gabapentin (NEURONTIN) capsule 400 mg, 400 mg, Oral, QID, Waldemar Dickens, MD, 400 mg at 04/07/16 2216 .  guaiFENesin (MUCINEX) 12 hr tablet 600 mg, 600 mg, Oral, BID, Waldemar Dickens, MD, 600 mg at 04/07/16 2216 .  ipratropium-albuterol (DUONEB) 0.5-2.5 (3) MG/3ML nebulizer solution 3 mL, 3 mL, Nebulization, Q2H PRN, Waldemar Dickens, MD .  lidocaine (LIDODERM) 5 % 1 patch, 1 patch, Transdermal, Q24H, Allie Bossier, MD, 1 patch at 04/07/16 1111 .  MEDLINE mouth rinse, 15 mL, Mouth Rinse, BID, Crist Infante, MD, 15 mL at 04/07/16 1000 .  methylPREDNISolone sodium succinate (SOLU-MEDROL) 125 mg/2 mL injection 60 mg, 60 mg, Intravenous, Q8H, Rahul P Desai, PA-C, 60 mg at 04/08/16 0548 .  mycophenolate (CELLCEPT) capsule 1,000 mg, 1,000 mg, Oral, BID, Rahul P Desai, PA-C, 1,000 mg at 04/07/16 2216 .  oxyCODONE (Oxy IR/ROXICODONE) immediate release tablet 5 mg, 5 mg, Oral, Q6H PRN, Waldemar Dickens, MD, 5 mg at 04/08/16 0556 .  pantoprazole (PROTONIX) EC tablet 40 mg, 40 mg, Oral, Daily, Waldemar Dickens, MD, 40 mg at 04/07/16 0909 .  potassium chloride SA (K-DUR,KLOR-CON) CR tablet 20 mEq, 20 mEq, Oral, Once per day on Mon Wed Fri, Waldemar Dickens, MD, 20 mEq at 04/07/16  1107 .  promethazine (PHENERGAN) tablet 12.5 mg, 12.5 mg, Oral, Q6H PRN, Waldemar Dickens, MD .  saccharomyces boulardii [facility]) capsule 250 mg, 250 mg, Oral, BID, Waldemar Dickens, MD, 250 mg at 04/07/16 2216 .  sodium chloride flush (NS) 0.9 % injection 3 mL, 3 mL, Intravenous, Q12H, Waldemar Dickens, MD, 3 mL at 04/07/16 2221 .  tadalafil (PAH) (ADCIRCA) tablet 40 mg, 40 mg, Oral, QHS, Waldemar Dickens, MD, 40 mg at 04/07/16 2220 .  Treprostinil (TYVASO) inhalation solution 18 mcg, 18  mcg, Inhalation, QID, Raylene Miyamoto, MD, 18 mcg at 04/08/16 7408  Past Medical History: Past Medical History:  Diagnosis Date  . Anemia   . C. difficile colitis   . CAD (coronary artery disease)   . Cataract   . CHF (congestive heart failure) (HCC)    right sided heart failure from pulmonary hypertension  . COPD (chronic obstructive pulmonary disease) ([place])   . Gallstones   . Hearing loss    right  . Hyperactive airway disease   . Hyperlipidemia   . Hypertension   . Lung cancer ([place]) carcinoid tumor  2004  . Osteopenia   . Pneumonia   . Raynaud's disease   . Restrictive lung disease   . Shingles   . Tubulovillous adenoma of rectum 2007    Tobacco Use: History  Smoking Status  . Never Smoker  Smokeless Tobacco  . Never Used    Labs: Recent Review Flowsheet Data    Labs for ITP Cardiac and Pulmonary Rehab Latest Ref Rng & Units 03/01/2011 04/05/2016 04/05/2016 04/05/2016 04/06/2016   Cholestrol 0 - 200 mg/dL 211(H) - - - -   LDLDIRECT mg/dL 143.1 - - - -   HDL >39.00 mg/dL 44.00 - - - -   Trlycerides 0.0 - 149.0 mg/dL 105.0 - - - -   Hemoglobin A1c 4.8 - 5.6 % - - - - 5.5   HCO3 20.0 - 28.0 mmol/L - 31.9(H) - 33.6(H) -   TCO2 0 - 100 mmol/L - 34 30 35 -   O2SAT % - 50.0 - 98.0 -      Capillary Blood Glucose: Lab Results  Component Value Date   GLUCAP 202 (H) 04/06/2016   GLUCAP 157 (H) 04/06/2016   GLUCAP 152 (H) 04/06/2016   GLUCAP 147 (H) 04/05/2016     ADL UCSD:     Pulmonary Assessment Scores    Row Name 02/25/16 1121         ADL UCSD   ADL Phase Entry     SOB Score total 67        Pulmonary Function Assessment:     Pulmonary Function Assessment - 02/06/16 1502      Breath   Bilateral Breath Sounds Other   Other fine crackles bases bilat right greater than left   Shortness of Breath Fear of Shortness of Breath;Limiting activity      Exercise Target Goals:    Exercise Program Goal: Individual exercise prescription set with THRR,  safety & activity barriers. Participant demonstrates ability to understand and report RPE using BORG scale, to self-measure pulse accurately, and to acknowledge the importance of the exercise prescription.  Exercise Prescription Goal: Starting with aerobic activity 30 plus minutes a day, 3 days per week for initial exercise prescription. Provide home exercise prescription and guidelines that participant acknowledges understanding prior to discharge.  Activity Barriers & Risk Stratification:     Activity Barriers & Cardiac Risk Stratification - 02/06/16  1500      Activity Barriers & Cardiac Risk Stratification   Activity Barriers Deconditioning;Muscular Weakness;Shortness of Breath      6 Minute Walk:     6 Minute Walk    Row Name 02/13/16 0744 02/26/16 1605       6 Minute Walk   Phase Initial Mid Program  for doctors office concerning o2 needs    Distance 552 feet 675 feet    Walk Time -  5 minutes and 45 seconds 6 minutes    # of Rest Breaks 1  15 seconds 0    MPH 1.04 1.27    METS 1.77 1.92    RPE 14 16    Perceived Dyspnea  4 3    Symptoms No No    Resting HR 124 bpm 124 bpm    Resting BP 140/70 150/66    Max Ex. HR 142 bpm 139 bpm    Max Ex. BP 160/60 210/80    2 Minute Post BP 124/64 150/50      Interval HR   Baseline HR 124 124    1 Minute HR 124 128    2 Minute HR 128 130    3 Minute HR 132 132    4 Minute HR 135 139    5 Minute HR 139 139    6 Minute HR 142 136    2 Minute Post HR 122 128    Interval Heart Rate? Yes Yes      Interval Oxygen   Interval Oxygen? Yes Yes    Baseline Oxygen Saturation % 94 % 94 %    Baseline Liters of Oxygen 4 L 4 L    1 Minute Oxygen Saturation % 92 % 92 %    1 Minute Liters of Oxygen 4 L 4 L    2 Minute Oxygen Saturation % 89 % 90 %    2 Minute Liters of Oxygen 4 L 4 L    3 Minute Oxygen Saturation % 89 % 89 %    3 Minute Liters of Oxygen 4 L 4 L    4 Minute Oxygen Saturation % 88 % 88 %    4 Minute Liters of Oxygen  4 L 4 L    5 Minute Oxygen Saturation % 87 % 90 %    5 Minute Liters of Oxygen 4 L 6 L    6 Minute Oxygen Saturation % 89 % 91 %    6 Minute Liters of Oxygen 4 L 6 L    2 Minute Post Oxygen Saturation % 100 % 94 %    2 Minute Post Liters of Oxygen 4 L 6 L       Initial Exercise Prescription:     Initial Exercise Prescription - 02/13/16 0700      Date of Initial Exercise RX and Referring Provider   Date 02/13/16   Referring Provider Dr. Nelda Marseille     Oxygen   Oxygen Continuous   Liters 4     Bike   Level 0.3   Minutes 17     NuStep   Level 1   Minutes 17   METs 1.5     Track   Laps 5   Minutes 17     Prescription Details   Frequency (times per week) 2   Duration Progress to 45 minutes of aerobic exercise without signs/symptoms of physical distress     Intensity   THRR 40-80% of Max Heartrate 63-126  Ratings of Perceived Exertion 11-13   Perceived Dyspnea 0-4     Progression   Progression Continue progressive overload as per policy without signs/symptoms or physical distress.     Resistance Training   Training Prescription Yes   Weight orange bands   Reps 10-12      Perform Capillary Blood Glucose checks as needed.  Exercise Prescription Changes:     Exercise Prescription Changes    Row Name 02/19/16 1500 02/24/16 1500 02/26/16 1600 03/02/16 1500 03/04/16 1600     Response to Exercise   Blood Pressure (Admit) 140/60 102/60 150/66 92/50 110/40   Blood Pressure (Exercise) 132/60 160/80 210/80  she will add her lasix back in her medicines,  126/60  -   Blood Pressure (Exit) 142/70 126/62 150/50 106/60 120/64   Heart Rate (Admit) 121 bpm 120 bpm 124 bpm 119 bpm 118 bpm   Heart Rate (Exercise) 122 bpm 130 bpm 139 bpm 132 bpm 122 bpm   Heart Rate (Exit) 116 bpm 115 bpm 128 bpm 114 bpm 111 bpm   Oxygen Saturation (Admit) 92 % 99 % 94 % 93 % 90 %   Oxygen Saturation (Exercise) 9592 % 89 % 88 % 89 % 96 %   Oxygen Saturation (Exit) 96 % 96 % 94 % 97 % 100 %    Rating of Perceived Exertion (Exercise)  - _0 Perceived Dyspnea (Exercise)  - _1 Duration Progress to 45 minutes of aerobic exercise without signs/symptoms of physical distress Progress to 45 minutes of aerobic exercise without signs/symptoms of physical distress Progress to 45 minutes of aerobic exercise without signs/symptoms of physical distress Progress to 45 minutes of aerobic exercise without signs/symptoms of physical distress Progress to 45 minutes of aerobic exercise without signs/symptoms of physical distress   Intensity -  40-80% HRR THRR unchanged THRR unchanged THRR unchanged THRR unchanged     Progression   Progression Continue to progress workloads to maintain intensity without signs/symptoms of physical distress. Continue to progress workloads to maintain intensity without signs/symptoms of physical distress. Continue to progress workloads to maintain intensity without signs/symptoms of physical distress. Continue to progress workloads to maintain intensity without signs/symptoms of physical distress. Continue to progress workloads to maintain intensity without signs/symptoms of physical distress.     Resistance Training   Training Prescription _2    Weight _3    Reps 10-12  10 minutes of strength training 10-12  10 minutes of strength training 10-12  10 minutes of strength training 10-12  10 minutes of strength training 10-12  10 minutes of strength training     Interval Training   Interval Training _4      Oxygen   Oxygen  - Continuous Continuous Continuous Continuous   Liters  - _5 Recumbant Bike   Level 1 1  - 1 1   Minutes 17 17  - 17 17     NuStep   Level 1 1  - 1  -   Minutes 17 17  - 17  -   METs  - 1.4  -  -  -     Track   Laps  - 5 5  performed another 6 minute walk test during class 8 7   Minutes  - _6 [place] Name 03/09/16 1515  03/16/16 1500 03/18/16 1600 03/23/16 1514 03/30/16 1500     Exercise Review   Progression  -  - Yes  - Yes     Response to Exercise   Blood Pressure (Admit) 100/40 100/50 98/60 104/50 114/70   Blood Pressure (Exercise) 100/40 110/50 134/62 120/60 120/56   Blood Pressure (Exit) 126/72 122/70 110/62 112/62 120/60   Heart Rate (Admit) 119 bpm 116 bpm 115 bpm 120 bpm 120 bpm   Heart Rate (Exercise) 130 bpm 124 bpm 116 bpm 126 bpm 129 bpm   Heart Rate (Exit) 109 bpm 110 bpm 100 bpm 114 bpm 114 bpm   Oxygen Saturation (Admit) 95 % 99 % 95 % 94 % 99 %   Oxygen Saturation (Exercise) 91 % 93 % 90 % 94 % 91 %   Oxygen Saturation (Exit) 98 % 99 % 93 % 100 % 100 %   Rating of Perceived Exertion (Exercise) _0 Perceived Dyspnea (Exercise) _1 Duration Progress to 45 minutes of aerobic exercise without signs/symptoms of physical distress Progress to 45 minutes of aerobic exercise without signs/symptoms of physical distress Progress to 45 minutes of aerobic exercise without signs/symptoms of physical distress Progress to 45 minutes of aerobic exercise without signs/symptoms of physical distress Progress to 45 minutes of aerobic exercise without signs/symptoms of physical distress   Intensity _2      Progression   Progression Continue to progress workloads to maintain intensity without signs/symptoms of physical distress. Continue to progress workloads to maintain intensity without signs/symptoms of physical distress. Continue to progress workloads to maintain intensity without signs/symptoms of physical distress. Continue to progress workloads to maintain intensity without signs/symptoms of physical distress. Continue to progress workloads to maintain intensity without signs/symptoms of physical distress.     Resistance Training   Training Prescription _3    Weight orange bands orange bands orange  bands orange bands orange bands   Reps 10-12  10 minutes of strength training 10-12  10 minutes of strength training 10-12  10 minutes of strength training 10-12  10 minutes of strength training 10-12  10 minutes of strength training     Interval Training   Interval Training _4      Oxygen   Oxygen _5    Liters _6 Recumbant Bike   Level _7 Minutes _8 NuStep   Level _9 Minutes _10 METs  -  -  - 1.6 1.6     Track   Laps 7 8  - 5 4   Minutes 17 17  - 17 17      Exercise Comments:     Exercise Comments    Row Name 03/16/16 0752 04/05/16 1612         Exercise Comments Patient has only attended 6 exercise sessions. She is up to 8 laps on the track and her MET level places her at a low level. Will cont. to motivate and encourage patient to increase her intensities.  Patient has been hospitalized. Will cont. to monitor and progress when patient returns.          Discharge Exercise Prescription (Final Exercise Prescription Changes):  Exercise Prescription Changes - 03/30/16 1500      Exercise Review   Progression Yes     Response to Exercise   Blood Pressure (Admit) 114/70   Blood Pressure (Exercise) 120/56   Blood Pressure (Exit) 120/60   Heart Rate (Admit) 120 bpm   Heart Rate (Exercise) 129 bpm   Heart Rate (Exit) 114 bpm   Oxygen Saturation (Admit) 99 %   Oxygen Saturation (Exercise) 91 %   Oxygen Saturation (Exit) 100 %   Rating of Perceived Exertion (Exercise) 12   Perceived Dyspnea (Exercise) 1   Duration Progress to 45 minutes of aerobic exercise without signs/symptoms of physical distress   Intensity THRR unchanged     Progression   Progression Continue to progress workloads to maintain intensity without signs/symptoms of physical distress.     Resistance Training   Training Prescription Yes   Weight orange bands   Reps 10-12   10 minutes of strength training     Interval Training   Interval Training No     Oxygen   Oxygen Continuous   Liters 6     Recumbant Bike   Level 2   Minutes 17     NuStep   Level 2   Minutes 17   METs 1.6     Track   Laps 4   Minutes 17       Nutrition:  Target Goals: Understanding of nutrition guidelines, daily intake of sodium <1566m, cholesterol <2073m calories 30% from fat and 7% or less from saturated fats, daily to have 5 or more servings of fruits and vegetables.  Biometrics:     Pre Biometrics - 02/19/16 1554      Pre Biometrics   Grip Strength 22 kg       Nutrition Therapy Plan and Nutrition Goals:     Nutrition Therapy & Goals - 03/30/16 1447      Nutrition Therapy   Diet High Calorie, High Protein     Personal Nutrition Goals   Personal Goal #1 Prevent further wt loss/promote wt gain     Intervention Plan   Intervention Prescribe, educate and counsel regarding individualized specific dietary modifications aiming towards targeted core components such as weight, hypertension, lipid management, diabetes, heart failure and other comorbidities.   Expected Outcomes Short Term Goal: Understand basic principles of dietary content, such as calories, fat, sodium, cholesterol and nutrients.;Long Term Goal: Adherence to prescribed nutrition plan.      Nutrition Discharge: Rate Your Plate Scores:     Nutrition Assessments - 03/30/16 1447      Rate Your Plate Scores   Pre Score 53      Psychosocial: Target Goals: Acknowledge presence or absence of depression, maximize coping skills, provide positive support system. Participant is able to verbalize types and ability to use techniques and skills needed for reducing stress and depression.  Initial Review & Psychosocial Screening:     Initial Psych Review & Screening - 02/06/16 [place]     Barriers   Psychosocial barriers to participate in program  The patient should benefit from training in stress management and relaxation.     Screening Interventions   Interventions Encouraged to exercise      Quality of Life Scores:     Quality of Life - 02/25/16 1121      Quality of Life Scores   Health/Function Pre 9.17 %   Socioeconomic Pre 20.93 %  Psych/Spiritual Pre 14.57 %   Family Pre 10.5 %   GLOBAL Pre 13.05 %      PHQ-9: Recent Review Flowsheet Data    Depression screen [facility] 2/9 02/06/2016 07/09/2014 01/21/2014   Decreased Interest 1 1 0   Down, Depressed, Hopeless 0 0 0   PHQ - 2 Score 1 1 0      Psychosocial Evaluation and Intervention:     Psychosocial Evaluation - 02/06/16 1504      Psychosocial Evaluation & Interventions   Interventions Encouraged to exercise with the program and follow exercise prescription      Psychosocial Re-Evaluation:     Psychosocial Re-Evaluation    [place] Name 02/19/16 1633 03/15/16 1458 04/06/16 0907         Psychosocial Re-Evaluation   Interventions Encouraged to attend Pulmonary Rehabilitation for the exercise Encouraged to attend Pulmonary Rehabilitation for the exercise Encouraged to attend Pulmonary Rehabilitation for the exercise     Comments no psychosocial issue identified no psychosocial issues identified good support system, no psychosocial issues identified at this time     Continued Psychosocial Services Needed No No No       Education: Education Goals: Education classes will be provided on a weekly basis, covering required topics. Participant will state understanding/return demonstration of topics presented.  Learning Barriers/Preferences:     Learning Barriers/Preferences - 02/06/16 1500      Learning Barriers/Preferences   Learning Barriers None   Learning Preferences Computer/Internet;Group Instruction;Verbal Instruction;Written Material      Education Topics: Risk Factor Reduction:  -Group instruction that is supported by a PowerPoint presentation.  Instructor discusses the definition of a risk factor, different risk factors for pulmonary disease, and how the heart and lungs work together.     Nutrition for Pulmonary Patient:  -Group instruction provided by PowerPoint slides, verbal discussion, and written materials to support subject matter. The instructor gives an explanation and review of healthy diet recommendations, which includes a discussion on weight management, recommendations for fruit and vegetable consumption, as well as protein, fluid, caffeine, fiber, sodium, sugar, and alcohol. Tips for eating when patients are short of breath are discussed. Flowsheet Row PULMONARY REHAB OTHER RESPIRATORY from 03/18/2016 in [place]  Date  03/04/16  Educator  [place]  Instruction Review Code  2- meets goals/outcomes      Pursed Lip Breathing:  -Group instruction that is supported by demonstration and informational handouts. Instructor discusses the benefits of pursed lip and diaphragmatic breathing and detailed demonstration on how to preform both.     Oxygen Safety:  -Group instruction provided by PowerPoint, verbal discussion, and written material to support subject matter. There is an overview of "What is Oxygen" and "Why do we need it".  Instructor also reviews how to create a safe environment for oxygen use, the importance of using oxygen as prescribed, and the risks of noncompliance. There is a brief discussion on traveling with oxygen and resources the patient may utilize.   Oxygen Equipment:  -Group instruction provided by [facility] Staff utilizing handouts, written materials, and equipment demonstrations. Flowsheet Row PULMONARY REHAB OTHER RESPIRATORY from 03/18/2016 in [place]  Date  02/26/16  Educator  [employer]  Instruction Review Code  2- meets goals/outcomes      Signs and Symptoms:  -Group instruction provided by written material and verbal discussion to  support subject matter. Warning signs and symptoms of infection, stroke, and heart attack are reviewed and when to  call the physician/911 reinforced. Tips for preventing the spread of infection discussed.   Advanced Directives:  -Group instruction provided by verbal instruction and written material to support subject matter. Instructor reviews Advanced Directive laws and proper instruction for filling out document.   Pulmonary Video:  -Group video education that reviews the importance of medication and oxygen compliance, exercise, good nutrition, pulmonary hygiene, and pursed lip and diaphragmatic breathing for the pulmonary patient. Flowsheet Row PULMONARY REHAB OTHER RESPIRATORY from 03/18/2016 in [place]  Date  02/19/16  Educator  video  Instruction Review Code  2- meets goals/outcomes      Exercise for the Pulmonary Patient:  -Group instruction that is supported by a PowerPoint presentation. Instructor discusses benefits of exercise, core components of exercise, frequency, duration, and intensity of an exercise routine, importance of utilizing pulse oximetry during exercise, safety while exercising, and options of places to exercise outside of rehab.     Pulmonary Medications:  -Verbally interactive group education provided by instructor with focus on inhaled medications and proper administration. Flowsheet Row PULMONARY REHAB OTHER RESPIRATORY from 03/18/2016 in [place]  Date  03/18/16  Educator  [facility]  Instruction Review Code  2- meets goals/outcomes      Anatomy and Physiology of the Respiratory System and Intimacy:  -Group instruction provided by PowerPoint, verbal discussion, and written material to support subject matter. Instructor reviews respiratory cycle and anatomical components of the respiratory system and their functions. Instructor also reviews differences in obstructive and restrictive respiratory  diseases with examples of each. Intimacy, Sex, and Sexuality differences are reviewed with a discussion on how relationships can change when diagnosed with pulmonary disease. Common sexual concerns are reviewed.   Knowledge Questionnaire Score:     Knowledge Questionnaire Score - 02/25/16 1120      Knowledge Questionnaire Score   Pre Score 13/13      Core Components/Risk Factors/Patient Goals at Admission:     Personal Goals and Risk Factors at Admission - 02/19/16 1631      Core Components/Risk Factors/Patient Goals on Admission    Weight Management Weight Maintenance      Core Components/Risk Factors/Patient Goals Review:      Goals and Risk Factor Review    Row Name 02/19/16 1632 03/15/16 1456 04/06/16 0906         Core Components/Risk Factors/Patient Goals Review   Personal Goals Review Increase Strength and Stamina;Improve shortness of breath with ADL's;Weight Management/Obesity Increase Strength and Stamina;Improve shortness of breath with ADL's;Weight Management/Obesity  weight gain, not weight loss Increase Strength and Stamina;Improve shortness of breath with ADL's;Weight Management/Obesity     Review Just started program today, work on weight management, too early to see any progression. 6 sessions completed, looks much better than at start of program, adjusting well Presently hospitalized for respiratory failure and atelectasis     Expected Outcomes  - continue to progress with workload increases Continue to work on goals when she is able to return to program        Core Components/Risk Factors/Patient Goals at Discharge (Final Review):      Goals and Risk Factor Review - 04/06/16 [id number]      Core Components/Risk Factors/Patient Goals Review   Personal Goals Review Increase Strength and Stamina;Improve shortness of breath with ADL's;Weight Management/Obesity   Review Presently hospitalized for respiratory failure and atelectasis   Expected Outcomes Continue to  work on goals when she is able to return to program  ITP Comments:   Comments: ITP REVIEW Pt is making expected progress toward pulmonary rehab goals after completing 10 sessions. Recommend continued exercise, life style modification, education, and utilization of breathing techniques to increase stamina and strength and decrease shortness of breath with exertion.

## 2016-04-08 NOTE — Consult Note (Signed)
Date of Admission:  04/05/2016  Date of Consult:  04/08/2016  Reason for Consult: Possible M avium infection Referring Physician: Dr. Titus Mould   HPI: Deborah [name] is an 66 y.o. 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 She has also been treated with antibacterial antibiotics for the last 4 days. A CT scan was obtained during this admission which shows.:  1. Status post left lower lobectomy. Severe varicoid bronchiectasis and atelectasis diffusely replaces the collapsed left upper lobe, significantly progressed since 12/31/2011 chest CT. 2. Small chronic left-sided loculated posterior hydropneumothorax, decreased since 12/31/2011 chest CT. 3. Extensive ground-glass attenuation, tree-in-bud opacities and tiny peripheral peribronchovascular foci of consolidation throughout the right lung, most suggestive of bronchopneumonia, with etiology including atypical causes such as mycobacterial or fungal infection. 4. Mild cylindrical bronchiolectasis throughout the right lung, most prominent in the right lower lobe, new since 12/31/2011 chest CT. 5. Trace dependent right pleural effusion/thickening. 6. Mild AP window mediastinal lymphadenopathy, decreased since 12/31/2011 chest CT, probably reactive. 7. Aortic atherosclerosis. Left main and two-vessel coronary atherosclerosis . 8. Stable dilated main pulmonary artery, suggesting chronic pulmonary arterial hypertension . 9. Cholelithiasis.  Dr. Titus Mould was concerned about a part of the lung which appeared potentially to be undergoing liquefaction. He questioned whether the person might be suffering from chronic M avium infection in the setting of her bronchiectasis and potential  long liquefaction. In talking with the patient right now she cannot produce sputum to even have it checked for AFB. Previously she had AFB cultures done in 2005 at [facility] on 2 different occasions and failed to grow any acid fast organisms. More recently in 2013 at [facility] she had 2 different sample sent 1 from a bronchial alveolar lavage for AFB culture and neither of these grew either. She is not losing weight consistently as outpatient does not suffer them consistent fevers and in my opinion does not clinically fit well for Mycobacterium avium infection.  If we prove that she had M avium be certainly happy to treat her but absent that proves my conical suspicion is not high enough to start her on him. Therapy in particular because such therapy would be difficult to tolerate and in the context of administering erythromycin would lead to multiple undesirable drug drug interactions with her immunosuppressive medications.  Past Medical History:  Diagnosis Date  . Anemia   . C. difficile colitis   . CAD (coronary artery disease)   . Cataract   . CHF (congestive heart failure) (HCC)    right sided heart failure from pulmonary hypertension  . COPD (chronic obstructive pulmonary disease) ([place])   . Gallstones   . Hearing loss    right  . Hyperactive airway disease   . Hyperlipidemia   . Hypertension   . Lung cancer ([place]) carcinoid tumor  2004  . Osteopenia   . Pneumonia   . Raynaud's disease   . Restrictive lung disease   . Shingles   . Tubulovillous adenoma of rectum 2007    Past Surgical History:  Procedure Laterality Date  . broncho pleural fistula  2004  . COLONOSCOPY    . pulmonary carcinoid  2004  . thoracotomies  2004  . VIDEO BRONCHOSCOPY  12/07/2011   Procedure: VIDEO BRONCHOSCOPY WITHOUT FLUORO;  Surgeon: Kathee Delton, MD;  Location: [facility];  Service: Cardiopulmonary;  Laterality: Bilateral;    Social History:  reports that she has never smoked. She has never used  smokeless tobacco. She reports that she does not drink alcohol or use drugs.   Family History  Problem Relation Age of Onset  . Adopted: Yes    Allergies  Allergen Reactions  . Megace Es [Megestrol Acetate]     Severe leg cramps   . Megestrol Acetate     REACTION: rash  . Lodine [Etodolac] Rash    REACTION: rash     Medications: I have reviewed patients current medications as documented in Epic Anti-infectives    Start     Dose/Rate Route Frequency Ordered Stop   04/08/16 1600  metroNIDAZOLE (FLAGYL) tablet 500 mg     500 mg Oral Every 8 hours 04/08/16 1447     04/07/16 2200  clindamycin (CLEOCIN) IVPB 600 mg  Status:  Discontinued     600 mg 100 mL/hr over 30 Minutes Intravenous Every 8 hours 04/07/16 1920 04/08/16 1446   04/07/16 1400  cefTRIAXone (ROCEPHIN) 1 g in dextrose 5 % 50 mL IVPB  Status:  Discontinued     1 g 100 mL/hr over 30 Minutes Intravenous Every 24 hours 04/07/16 1007 04/07/16 1117   04/07/16 1400  cefTRIAXone (ROCEPHIN) 2 g in dextrose 5 % 50 mL IVPB     2 g 100 mL/hr over 30 Minutes Intravenous Every 24 hours 04/07/16 1117     04/05/16 2100  vancomycin (VANCOCIN) 500 mg in sodium chloride 0.9 % 100 mL IVPB  Status:  Discontinued     500 mg 100 mL/hr over 60 Minutes Intravenous Every 12 hours 04/05/16 0944 04/07/16 1007   04/05/16 1400  piperacillin-tazobactam (ZOSYN) IVPB 3.375 g  Status:  Discontinued     3.375 g 12.5 mL/hr over 240 Minutes Intravenous Every 8 hours 04/05/16 0944 04/07/16 1007   04/05/16 0930  vancomycin (VANCOCIN) IVPB 1000 mg/200 mL premix     1,000 mg 200 mL/hr over 60 Minutes Intravenous  Once 04/05/16 0921 04/05/16 1218   04/05/16 0930  piperacillin-tazobactam (ZOSYN) IVPB 3.375 g     3.375 g 100 mL/hr over 30 Minutes Intravenous  Once 04/05/16 0921 04/05/16 1028         ROS:as in HPI otherwise remainder of 12 point Review of Systems is negative   Blood pressure 119/72, pulse (!) 114, temperature 98.4 F (36.9 C),  temperature source Oral, resp. rate (!) 22, height _0  (1.651 m), weight 118 lb 9.7 oz (53.8 kg), SpO2 96 %. General: Alert and awake, oriented x3, not in any acute distress. HEENT: anicteric sclera,  EOMI, oropharynx clear and without exudate Cardiovascular: egular rate, normal r,  no murmur rubs or gallops Pulmonary: Long expiratory phase with crackles at the bases  Gastrointestinal: soft nontender, nondistended, normal bowel sounds, Musculoskeletal: no  clubbing or edema noted bilaterally Skin, soft tissue: no rashes Neuro: nonfocal, strength and sensation intact   Results for orders placed or performed during the hospital encounter of 04/05/16 (from the past 48 hour(s))  Procalcitonin     Status: None   Collection Time: 04/07/16  5:31 AM  Result Value Ref Range   Procalcitonin 0.85 ng/mL    Comment:        Interpretation: PCT > 0.5 ng/mL and <= 2 ng/mL: Systemic infection (sepsis) is possible, but other conditions are known to elevate PCT as well. (NOTE)  ICU PCT Algorithm               Non ICU PCT Algorithm    ----------------------------     ------------------------------         PCT < 0.25 ng/mL                 PCT < 0.1 ng/mL     Stopping of antibiotics            Stopping of antibiotics       strongly encouraged.               strongly encouraged.    ----------------------------     ------------------------------       PCT level decrease by               PCT < 0.25 ng/mL       >= 80% from peak PCT       OR PCT 0.25 - 0.5 ng/mL          Stopping of antibiotics                                             encouraged.     Stopping of antibiotics           encouraged.    ----------------------------     ------------------------------       PCT level decrease by              PCT >= 0.25 ng/mL       < 80% from peak PCT        AND PCT >= 0.5 ng/mL             Continuing antibiotics                                              encouraged.       Continuing  antibiotics            encouraged.    ----------------------------     ------------------------------     PCT level increase compared          PCT > 0.5 ng/mL         with peak PCT AND          PCT >= 0.5 ng/mL             Escalation of antibiotics                                          strongly encouraged.      Escalation of antibiotics        strongly encouraged.   Basic metabolic panel     Status: Abnormal   Collection Time: 04/07/16  5:31 AM  Result Value Ref Range   Sodium 140 135 - 145 mmol/L   Potassium 3.8 3.5 - 5.1 mmol/L   Chloride 98 (L) 101 - 111 mmol/L   CO2 34 (H) 22 - 32 mmol/L   Glucose, Bld 129 (H) 65 - 99 mg/dL   BUN 23 (H) 6 - 20 mg/dL   Creatinine, Ser 0.67 0.44 - 1.00 mg/dL  Calcium 8.8 (L) 8.9 - 10.3 mg/dL   GFR calc non Af Amer >60 >60 mL/min   GFR calc Af Amer >60 >60 mL/min    Comment: (NOTE) The eGFR has been calculated using the CKD EPI equation. This calculation has not been validated in all clinical situations. eGFR's persistently <60 mL/min signify possible Chronic Kidney Disease.    Anion gap 8 5 - 15  Magnesium     Status: None   Collection Time: 04/07/16  5:31 AM  Result Value Ref Range   Magnesium 2.1 1.7 - 2.4 mg/dL  Lactic acid, plasma     Status: None   Collection Time: 04/07/16  5:31 AM  Result Value Ref Range   Lactic Acid, Venous 0.7 0.5 - 1.9 mmol/L  Basic metabolic panel     Status: Abnormal   Collection Time: 04/08/16  1:43 AM  Result Value Ref Range   Sodium 139 135 - 145 mmol/L   Potassium 3.5 3.5 - 5.1 mmol/L   Chloride 97 (L) 101 - 111 mmol/L   CO2 34 (H) 22 - 32 mmol/L   Glucose, Bld 140 (H) 65 - 99 mg/dL   BUN 26 (H) 6 - 20 mg/dL   Creatinine, Ser 0.77 0.44 - 1.00 mg/dL   Calcium 8.7 (L) 8.9 - 10.3 mg/dL   GFR calc non Af Amer >60 >60 mL/min   GFR calc Af Amer >60 >60 mL/min    Comment: (NOTE) The eGFR has been calculated using the CKD EPI equation. This calculation has not been validated in all clinical  situations. eGFR's persistently <60 mL/min signify possible Chronic Kidney Disease.    Anion gap 8 5 - 15  Magnesium     Status: None   Collection Time: 04/08/16  1:43 AM  Result Value Ref Range   Magnesium 2.1 1.7 - 2.4 mg/dL   _0 (sdes,specrequest,cult,reptstatus)   ) Recent Results (from the past 720 hour(s))  Blood culture (routine x 2)     Status: None (Preliminary result)   Collection Time: 04/05/16  9:14 AM  Result Value Ref Range Status   Specimen Description BLOOD RIGHT ANTECUBITAL  Final   Special Requests BOTTLES DRAWN AEROBIC ONLY  5CC  Final   Culture NO GROWTH 3 DAYS  Final   Report Status PENDING  Incomplete  Blood culture (routine x 2)     Status: None (Preliminary result)   Collection Time: 04/05/16  9:21 AM  Result Value Ref Range Status   Specimen Description BLOOD RIGHT FOREARM  Final   Special Requests BOTTLES DRAWN AEROBIC ONLY  5CC  Final   Culture NO GROWTH 3 DAYS  Final   Report Status PENDING  Incomplete  Respiratory Panel by PCR     Status: Abnormal   Collection Time: 04/05/16 12:42 PM  Result Value Ref Range Status   Adenovirus NOT DETECTED NOT DETECTED Final   Coronavirus 229E NOT DETECTED NOT DETECTED Final   Coronavirus HKU1 NOT DETECTED NOT DETECTED Final   Coronavirus NL63 NOT DETECTED NOT DETECTED Final   Coronavirus OC43 NOT DETECTED NOT DETECTED Final   Metapneumovirus NOT DETECTED NOT DETECTED Final   Rhinovirus / Enterovirus NOT DETECTED NOT DETECTED Final   Influenza A NOT DETECTED NOT DETECTED Final   Influenza B NOT DETECTED NOT DETECTED Final   Parainfluenza Virus 1 NOT DETECTED NOT DETECTED Final   Parainfluenza Virus 2 NOT DETECTED NOT DETECTED Final   Parainfluenza Virus 3 NOT DETECTED NOT DETECTED Final   Parainfluenza Virus 4 NOT  DETECTED NOT DETECTED Final   Respiratory Syncytial Virus DETECTED (A) NOT DETECTED Final    Comment: CRITICAL RESULT CALLED TO, READ BACK BY AND VERIFIED WITH: Milon Dikes RN 17:20  04/05/16 ([email or web address])    Bordetella pertussis NOT DETECTED NOT DETECTED Final   Chlamydophila pneumoniae NOT DETECTED NOT DETECTED Final   Mycoplasma pneumoniae NOT DETECTED NOT DETECTED Final  MRSA PCR Screening     Status: None   Collection Time: 04/05/16  8:09 PM  Result Value Ref Range Status   MRSA by PCR NEGATIVE NEGATIVE Final    Comment:        The GeneXpert MRSA Assay (FDA approved for NASAL specimens only), is one component of a comprehensive MRSA colonization surveillance program. It is not intended to diagnose MRSA infection nor to guide or monitor treatment for MRSA infections.      Impression/Recommendation  Active Problems:   Acute respiratory failure (HCC)   Carcinoid tumor of left lung   S/P partial lobectomy of lung   Chronic pain   Acute on chronic respiratory failure with hypoxia and hypercapnia (HCC)   Hyperglycemia   Sepsis (HCC)   Acute respiratory distress   Respiratory syncytial virus (RSV) infection   Dyspnea   Lung collapse   CHONTEL WARNING is a 66 y.o. 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 +/- Less likely bacterial infection with question of whether she could be suffering from mycobacterial infection.  #1 RSV infection: This seems to be getting better I don't see the point of starting ribavirin at this point  #2 possible superimposed bacterial infection: I'm skeptical this was certainly discontinue antibiotics tomorrow-discontinue clindamycin replaced her regimen was ceftriaxone and metronidazole for now again I would discontinue this after tomorrow  #3 possible M. avium: If she can send sputum for culture with sent for AFB culture, otherwise my clinical suspicion is not high enough to  start empiric therapy  #4 history of clostridium difficile colitis: She is on florastor store to try to prevent this to help with diarrhea. This probiotic has not been proven to reduce incidence of Clostridium difficile and is been not especially effective with antibiotic associated diarrhea either. It has been associated with rare cases of fungemia in immunocompromised patient such as this patient/ Therefore I will dc florastor  I will dc her protonix as  states that she does not suffer from gastroesophageal reflux disease and that she was put on this as a "preventative medication. We certainly know that proton pump inhibitors along with antibiotics are freely synergistic for causing C. difficile colitis. I will replace her PPI with when necessary Pepcid  We'll be happy to see her in the outpatient work world if she needs an infectious disease doctor to follow her otherwise she is trying to establish care with pulmonary here locally in addition to the doctor she is following at [facility].    04/08/2016, 4:37 PM   Thank you so much for this interesting consult    [place] [facility] [place] [phone] (pager) [phone] (office) 04/08/2016, 4:37 PM  Rhina [place] 04/08/2016, 4:37 PM

## 2016-04-08 NOTE — Consult Note (Addendum)
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   She sees Dr. Corrin Parker at [facility] and had admission to [facility] in November 2017 with parainfluenza 1.  During that admission, it was felt that she was experiencing silent aspiration (1 swallow eval demonstrated aspiration while the 2nd did not.  Both studies did demonstrate esophageal dysmotility however).  Latest PFT's from May 2017:  FVC 1.32 (42% pred), FEV1 0.96 (39% pred), ratio 72.55%, TLC 36% pred, DLCO 5.35 (28%).   SUBJECTIVE: NO bipap used, CT chest done  VITAL SIGNS: Temp:  [97.8 F (36.6 C)-98.6 F (37 C)] 98.1 F (36.7 C) (02/15 0838) Pulse Rate:  [92-113] 113 (02/15 0838) Resp:  [23-28] 23 (02/15 0838) BP: (123-190)/(73-89) 190/83 (02/15 0838) SpO2:  [88 %-100 %] 93 % (02/15 0838) FiO2 (%):  [40 %] 40 % (02/15 0827)  PHYSICAL EXAMINATION: General:  Chronically ill appearing female in NAD HEENT: jvd down PSY: anxious, increased Neuro: AAOx4, nonfocal, less anxiety CV: s1s2 rrr, no m/r/g PULM:no distress, maybe some improved aeration left upper DV:VOHY, non-tender, bsx4 active  Extremities: warm/dry, no edema  Skin: no rashes or lesions    Recent Labs Lab 04/06/16 0112 04/07/16 0531 04/08/16 0143  NA 141 140 139  K 4.3 3.8 3.5  CL 95* 98* 97*  CO2 33* 34* 34*  BUN 15 23* 26*  CREATININE 0.65 0.67 0.77  GLUCOSE 128* 129* 140*    Recent Labs Lab 04/05/16 0842 04/05/16 0904 04/06/16 0112  HGB 9.2* 10.5* 8.6*  HCT 31.6* 31.0* 28.6*  WBC 10.5  --  4.5  PLT 385  --  279   Ct Chest Wo Contrast  Result  Date: 04/07/2016 CLINICAL DATA:  66 year old female inpatient with a history of left lower lobectomy for pulmonary carcinoid in 2004, now with acute on chronic respiratory failure. History of bronchiectasis and restrictive lung disease. EXAM: CT CHEST WITHOUT CONTRAST TECHNIQUE: Multidetector CT imaging of the chest was performed following the standard protocol without IV contrast. COMPARISON:  Chest radiograph from earlier today. 12/31/2011 high-resolution chest CT . FINDINGS: Cardiovascular: Normal heart size. No significant pericardial fluid/thickening. Left main, left anterior descending and right coronary atherosclerosis. Atherosclerotic nonaneurysmal thoracic aorta. Chronic main pulmonary artery dilation (3.4 cm diameter), unchanged. Mediastinum/Nodes: No discrete thyroid nodules. Unremarkable esophagus. No axillary adenopathy. Mild AP window adenopathy measuring up to 1.0 cm (series 2/image 16), decreased from 1.6 cm on 12/31/2011. No new pathologically enlarged mediastinal or gross hilar nodes on this noncontrast scan. Lungs/Pleura: No right pneumothorax. Trace dependent right pleural effusion/thickening. Status post left lower lobectomy. Chronic loculated posterior left hydropneumothorax with tiny fluid level and associated diffuse left pleural thickening and calcification, decreased in size since 12/31/2011. Severe volume loss in the left hemithorax with prominent left mediastinal shift. Severe varicoid bronchiectasis and atelectasis diffusely replaces the collapsed left upper lobe, with significant progression of left upper lobe bronchiectasis since 12/31/2011 chest CT. Extensive patchy tree-in-bud opacities, tiny foci of peripheral peribronchovascular consolidation (for example in the posterior right lower lobe (series 5/ image 66)) and ground-glass attenuation throughout the entire right lung, largely new  since 12/31/2011, largely sparing the subpleural lungs. Mild cylindrical bronchiolectasis  throughout the right lung, most prominent in the right lower lobe, new since 12/31/2011. No frank honeycombing. Upper abdomen: Cholelithiasis. Musculoskeletal: No aggressive appearing focal osseous lesions. Stable healed thoracotomy deformities in the left sixth through ninth ribs posterolaterally. IMPRESSION: 1. Status post left lower lobectomy. Severe varicoid bronchiectasis and atelectasis diffusely replaces the collapsed left upper lobe, significantly progressed since 12/31/2011 chest CT. 2. Small chronic left-sided loculated posterior hydropneumothorax, decreased since 12/31/2011 chest CT. 3. Extensive ground-glass attenuation, tree-in-bud opacities and tiny peripheral peribronchovascular foci of consolidation throughout the right lung, most suggestive of bronchopneumonia, with etiology including atypical causes such as mycobacterial or fungal infection. 4. Mild cylindrical bronchiolectasis throughout the right lung, most prominent in the right lower lobe, new since 12/31/2011 chest CT. 5. Trace dependent right pleural effusion/thickening. 6. Mild AP window mediastinal lymphadenopathy, decreased since 12/31/2011 chest CT, probably reactive. 7. Aortic atherosclerosis. Left main and two-vessel coronary atherosclerosis . 8. Stable dilated main pulmonary artery, suggesting chronic pulmonary arterial hypertension . 9. Cholelithiasis. Electronically Signed   By: Ilona Sorrel M.D.   On: 04/07/2016 15:31   Dg Chest Port 1 View  Result Date: 04/08/2016 CLINICAL DATA:  Lung collapse EXAM: PORTABLE CHEST 1 VIEW COMPARISON:  Chest radiograph and chest CT April 07, 2016 FINDINGS: There is virtually complete collapse on the left. There is postoperative change on the left. There is consolidation in remaining left lung with what appears to be varicoid bronchiectasis in the left upper lobe, better demonstrated on CT. The small pneumothorax in the left base seen on CT 1 day prior is not evident by radiography. On the  right, there is patchy atelectatic change. Bronchiectatic change on the right is better appreciated by CT. Cardiac silhouette is within normal limits. There is aortic atherosclerosis. No adenopathy is evident on the right ; left side is obscured, and assessment for adenopathy cannot be made by radiography. There are postoperative rib defects on the left. IMPRESSION: No appreciable change radiographically compared to 1 day prior. Marked volume loss on the left with postoperative change in consolidation. Varicoid bronchiectasis in the left upper lobe is seen by radiography with better appreciated by CT. On the right, there is patchy atelectasis and areas of bronchiectatic change, better demonstrated by CT. No change in cardiac silhouette. There is aortic atherosclerosis. Electronically Signed   By: Lowella Grip III M.D.   On: 04/08/2016 07:20   Dg Chest Port 1 View  Result Date: 04/07/2016 CLINICAL DATA:  Collapsed left lung. EXAM: PORTABLE CHEST 1 VIEW COMPARISON:  04/06/2016 FINDINGS: Postoperative changes from a left thoracoplasty are again identified. There is persistent near complete opacification of the entire left hemithorax. The right lung appears hyperinflated and clear. IMPRESSION: 1. No change in aeration to the left hemithorax compared with previous exam. 2. Chronic postsurgical changes. Electronically Signed   By: Kerby Moors M.D.   On: 04/07/2016 10:27    STUDIES:  CXR 02/12 > almost complete opacification left lung (chronic changes), atelectasis. CT chest 2/15>>>Status post left lower lobectomy. Severe varicoid bronchiectasis and atelectasis diffusely replaces the collapsed left upper lobe, significantly progressed since 12/31/2011 chest CT. 2. Small chronic left-sided loculated posterior hydropneumothorax, decreased since 12/31/2011 chest CT. 3. Extensive ground-glass attenuation, tree-in-bud opacities and tiny peripheral peribronchovascular foci of consolidation throughout the  right lung, most suggestive of bronchopneumonia, with etiology including atypical causes such as mycobacterial or fungal infection. 4. Mild cylindrical bronchiolectasis throughout the right lung, most prominent  in the right lower lobe, new since 12/31/2011 chest CT. 5. Trace dependent right pleural effusion/thickening.   SIGNIFICANT EVENTS: 2/12 Admit with AonC hypoxic respiratory failure  ANTIBIOTICS: Vanco 2/12 >> 2/14 Zosyn 2/12 >>2/14 Ceftraixone 2/14 >>> Clindamycin 2/14>>>  ASSESSMENT / PLAN:  Acute on chronic hypoxic + acute hypercarbic respiratory failure - multifactorial in the setting of known bronchiectasis, severe restrictive lung disease, ILD.   Must also consider opportunistic infection.   Severe bronchiectasis - chronic; CXR now shows evolution / worsening of known left lung disease and [facility] reportedly suggests possible BTX in right base as well. Severe restrictive lung disease with severely reduced diffusion capacity. Atelectasis. Probable Chronic L Hydropneumothorax  Pulmonary Hypertension associated with Connective Tissue Disease  Intermittent Silent Aspiration   Plan: No collapse to treat on ct, dc chest pt and mucomyst Ct chest reviewed, appears to be Liquifed lung now and likely occurred chronically, her clinical circumstance is not c.w necrotizing PNA- clinda added for now, likely to dc NIMV not used, okay to use pRN with CT noted pcxr reviewed, no repeat needed I wonder if now Liquifed lung is associated MAC? Related, will consult ID Also will discuss DNR / DNI status given chronicity and poor output if declined Send AFB, reduce steroids for rsv bmet in am , lose dose lasix I attempted to call Dr Corrin Parker x 3, will pray I get call back from covering MD, he is on vacation Updated pt and husband in [place]. Titus Mould, MD, FACP Pgr: [name] 04/08/2016 9:16 AM    I have had extensive discussions with pt.  We discussed patients current circumstances and organ failures. We also discussed patient's wishes under circumstances such as this. PT  has decided to NOT perform resuscitation if arrest but to continue current medical support for now. And NO ETT

## 2016-04-09 ENCOUNTER — Encounter (HOSPITAL_COMMUNITY): Payer: BLUE CROSS/BLUE SHIELD

## 2016-04-09 DIAGNOSIS — R739 Hyperglycemia, unspecified: Secondary | ICD-10-CM

## 2016-04-09 LAB — BASIC METABOLIC PANEL
ANION GAP: 9 (ref 5–15)
BUN: 22 mg/dL — AB (ref 6–20)
CO2: 33 mmol/L — AB (ref 22–32)
Calcium: 8.7 mg/dL — ABNORMAL LOW (ref 8.9–10.3)
Chloride: 99 mmol/L — ABNORMAL LOW (ref 101–111)
Creatinine, Ser: 0.69 mg/dL (ref 0.44–1.00)
GFR calc Af Amer: >60 mL/min (ref >60)
GLUCOSE: 101 mg/dL — AB (ref 65–99)
POTASSIUM: 3.7 mmol/L (ref 3.5–5.1)
Sodium: 141 mmol/L (ref 135–145)

## 2016-04-09 LAB — ACID FAST SMEAR (AFB)

## 2016-04-09 LAB — ACID FAST SMEAR (AFB, MYCOBACTERIA): Acid Fast Smear: NEGATIVE

## 2016-04-09 LAB — PROCALCITONIN: PROCALCITONIN: 0.16 ng/mL

## 2016-04-09 LAB — MAGNESIUM: Magnesium: 2.1 mg/dL (ref 1.7–2.4)

## 2016-04-09 MED ORDER — OXYCODONE HCL 5 MG PO TABS
5.0000 mg | ORAL_TABLET | Freq: Four times a day (QID) | ORAL | Status: DC
  Administered 2016-04-09 – 2016-04-12 (×13): 5 mg via ORAL
  Filled 2016-04-09 (×13): qty 1

## 2016-04-09 MED ORDER — TREPROSTINIL 0.6 MG/ML IN SOLN
18.0000 ug | Freq: Four times a day (QID) | RESPIRATORY_TRACT | Status: DC
  Administered 2016-04-09 – 2016-04-12 (×4): 18 ug via RESPIRATORY_TRACT

## 2016-04-09 MED ORDER — METHYLPREDNISOLONE SODIUM SUCC 40 MG IJ SOLR
20.0000 mg | Freq: Two times a day (BID) | INTRAMUSCULAR | Status: DC
  Administered 2016-04-09 – 2016-04-10 (×2): 20 mg via INTRAVENOUS
  Filled 2016-04-09 (×2): qty 1

## 2016-04-09 MED ORDER — LOPERAMIDE HCL 2 MG PO CAPS
4.0000 mg | ORAL_CAPSULE | Freq: Four times a day (QID) | ORAL | Status: DC | PRN
  Administered 2016-04-09: 4 mg via ORAL
  Administered 2016-04-09 (×2): 2 mg via ORAL
  Administered 2016-04-10 – 2016-04-12 (×6): 4 mg via ORAL
  Filled 2016-04-09 (×9): qty 2

## 2016-04-09 NOTE — Evaluation (Signed)
Physical Therapy Evaluation Patient Details Name: Deborah [name] MRN: [id number] DOB: [birth year] Today's Date: 04/09/2016   History of Present Illness  Pt is a 66 y/o female admitted secondary to cough, fever, SOB, acute on chronic respiratory failure requiring BiPAP (?RSV). PMH including but not limited to carcinoid tumor in 2004 s/p resection of near entire L lung, Raynaud's, CAD, CHF and HTN.  Clinical Impression  Pt presented sitting OOB in recliner when PT entered room. Prior to admission, pt reported that she was independent with all functional mobility and occasionally required assistance with ADLs from husband. Pt currently at min guard to supervision level for all mobility. Pt ambulated ~30' on 5L of O2 via HFNC during first trial with SPO2 maintaining >89% (HR at 121-130); however, upon sitting in recliner after ambulation pt's SPO2 decreased to as low as 78% with slow recovery to >90% with sitting rest break and increase in supplemental O2 to 6L via HFNC. During second ambulation, pt on 6L of O2 with SPO2 fluctuating from low 80's to mid 90's (HR at 120-127). Pt remained on 6L of O2 at end of session with HR at 118 bpm and SPO2 at 93%. Pt would continue to benefit from skilled physical therapy services at this time while admitted and after d/c to address her below listed limitations in order to improve her overall safety and independence with functional mobility.      Follow Up Recommendations Other (comment) (Cardiac Rehab)    Equipment Recommendations  None recommended by PT    Recommendations for Other Services       Precautions / Restrictions Restrictions Weight Bearing Restrictions: No      Mobility  Bed Mobility Overal bed mobility: Needs Assistance Bed Mobility: Sit to Supine       Sit to supine: Min guard   General bed mobility comments: increased time, min guard for safety  Transfers Overall transfer level: Needs assistance Equipment used: None Transfers:  Sit to/from Stand;Stand Pivot Transfers Sit to Stand: Min guard Stand pivot transfers: Min guard       General transfer comment: increased time, min guard for safety  Ambulation/Gait Ambulation/Gait assistance: Supervision Ambulation Distance (Feet): 30 Feet (30' x2 with sitting rest break) Assistive device: Rolling walker (2 wheeled) Gait Pattern/deviations: Step-through pattern;Decreased step length - right;Decreased step length - left;Decreased stride length Gait velocity: decreased Gait velocity interpretation: Below normal speed for age/gender General Gait Details: pt demonstrated safety and stability with use of RW, no LOB or physical assistance needed, supervision for safety and management of lines  Stairs            Wheelchair Mobility    Modified Rankin (Stroke Patients Only)       Balance Overall balance assessment: Needs assistance Sitting-balance support: Feet supported Sitting balance-Leahy Scale: Fair     Standing balance support: During functional activity;No upper extremity supported Standing balance-Leahy Scale: Fair                               Pertinent Vitals/Pain Pain Assessment: No/denies pain    Home Living Family/patient expects to be discharged to:: Private residence Living Arrangements: Spouse/significant other Available Help at Discharge: Family;Available 24 hours/day           Home Equipment: Walker - 4 wheels;Bedside commode;Shower seat;Wheelchair - manual      Prior Function Level of Independence: Independent         Comments: pt reported that  she was participating in cardiac rehab 3x week     Hand Dominance        Extremity/Trunk Assessment   Upper Extremity Assessment Upper Extremity Assessment: Generalized weakness    Lower Extremity Assessment Lower Extremity Assessment: Generalized weakness       Communication   Communication: HOH  Cognition Arousal/Alertness: Awake/alert Behavior  During Therapy: WFL for tasks assessed/performed;Anxious;Flat affect Overall Cognitive Status: Within Functional Limits for tasks assessed                      General Comments      Exercises     Assessment/Plan    PT Assessment Patient needs continued PT services  PT Problem List Decreased strength;Decreased activity tolerance;Decreased balance;Decreased mobility;Decreased coordination;Cardiopulmonary status limiting activity          PT Treatment Interventions DME instruction;Gait training;Stair training;Functional mobility training;Therapeutic activities;Therapeutic exercise;Balance training;Neuromuscular re-education;Patient/family education    PT Goals (Current goals can be found in the Care Plan section)  Acute Rehab PT Goals Patient Stated Goal: return home PT Goal Formulation: With patient Time For Goal Achievement: 04/23/16 Potential to Achieve Goals: Good    Frequency Min 3X/week   Barriers to discharge        Co-evaluation               End of Session Equipment Utilized During Treatment: Gait belt;Oxygen (5-6L O2 via HFNC) Activity Tolerance: Patient limited by fatigue Patient left: in bed;with call bell/phone within reach Nurse Communication: Mobility status         Time: 8453-0631 PT Time Calculation (min) (ACUTE ONLY): 36 min   Charges:   PT Evaluation $PT Eval Moderate Complexity: 1 Procedure PT Treatments $Gait Training: 8-22 mins   PT G CodesClearnce Sorrel Zyire Eidson 04/09/2016, 2:49 PM Sherie Don, [place], DPT [phone]

## 2016-04-09 NOTE — Progress Notes (Signed)
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   She sees Dr. Corrin Parker at [facility] and had admission to [facility] in November 2017 with parainfluenza 1.  During that admission, it was felt that she was experiencing silent aspiration (1 swallow eval demonstrated aspiration while the 2nd did not.  Both studies did demonstrate esophageal dysmotility however).  Latest PFT's from May 2017:  FVC 1.32 (42% pred), FEV1 0.96 (39% pred), ratio 72.55%, TLC 36% pred, DLCO 5.35 (28%).   SUBJECTIVE: 2 loose stools, no NIMV  VITAL SIGNS: Temp:  [97.6 F (36.4 C)-98.7 F (37.1 C)] 98.5 F (36.9 C) (02/16 0700) Pulse Rate:  [91-131] 91 (02/16 0700) Resp:  [18-32] 20 (02/16 0700) BP: (111-181)/(66-101) 181/90 (02/16 0700) SpO2:  [92 %-100 %] 97 % (02/16 0912)  PHYSICAL EXAMINATION: General:  Chronically ill appearing female in NAD in chair HEENT: no stridor, airway some lateral to left PSY: anxious resolved Neuro: AAOx4, nonfocal, less anxiety CV: s1s2 rrr, no m/r/g PULM:poor air movement left upper, coarse improved on right PZ:WCHE, non-tender, bsx4 active  Extremities: warm/dry, no edema  Skin: no rashes or lesions    Recent Labs Lab 04/07/16 0531 04/08/16 0143 04/09/16 0652  NA 140 139 141  K 3.8 3.5 3.7  CL 98* 97* 99*  CO2 34* 34* 33*  BUN 23* 26* 22*  CREATININE 0.67 0.77 0.69  GLUCOSE 129* 140* 101*    Recent Labs Lab 04/05/16 0842 04/05/16 0904 04/06/16 0112  HGB 9.2* 10.5* 8.6*  HCT 31.6* 31.0* 28.6*  WBC 10.5  --  4.5  PLT 385  --  279   Ct Chest Wo Contrast  Result  Date: 04/07/2016 CLINICAL DATA:  66 year old female inpatient with a history of left lower lobectomy for pulmonary carcinoid in 2004, now with acute on chronic respiratory failure. History of bronchiectasis and restrictive lung disease. EXAM: CT CHEST WITHOUT CONTRAST TECHNIQUE: Multidetector CT imaging of the chest was performed following the standard protocol without IV contrast. COMPARISON:  Chest radiograph from earlier today. 12/31/2011 high-resolution chest CT . FINDINGS: Cardiovascular: Normal heart size. No significant pericardial fluid/thickening. Left main, left anterior descending and right coronary atherosclerosis. Atherosclerotic nonaneurysmal thoracic aorta. Chronic main pulmonary artery dilation (3.4 cm diameter), unchanged. Mediastinum/Nodes: No discrete thyroid nodules. Unremarkable esophagus. No axillary adenopathy. Mild AP window adenopathy measuring up to 1.0 cm (series 2/image 16), decreased from 1.6 cm on 12/31/2011. No new pathologically enlarged mediastinal or gross hilar nodes on this noncontrast scan. Lungs/Pleura: No right pneumothorax. Trace dependent right pleural effusion/thickening. Status post left lower lobectomy. Chronic loculated posterior left hydropneumothorax with tiny fluid level and associated diffuse left pleural thickening and calcification, decreased in size since 12/31/2011. Severe volume loss in the left hemithorax with prominent left mediastinal shift. Severe varicoid bronchiectasis and atelectasis diffusely replaces the collapsed left upper lobe, with significant progression of left upper lobe bronchiectasis since 12/31/2011 chest CT. Extensive patchy tree-in-bud opacities, tiny foci of peripheral peribronchovascular consolidation (for example in the posterior right lower lobe (series 5/ image 66)) and ground-glass attenuation throughout the entire right lung, largely new since 12/31/2011,  largely sparing the subpleural lungs. Mild cylindrical bronchiolectasis  throughout the right lung, most prominent in the right lower lobe, new since 12/31/2011. No frank honeycombing. Upper abdomen: Cholelithiasis. Musculoskeletal: No aggressive appearing focal osseous lesions. Stable healed thoracotomy deformities in the left sixth through ninth ribs posterolaterally. IMPRESSION: 1. Status post left lower lobectomy. Severe varicoid bronchiectasis and atelectasis diffusely replaces the collapsed left upper lobe, significantly progressed since 12/31/2011 chest CT. 2. Small chronic left-sided loculated posterior hydropneumothorax, decreased since 12/31/2011 chest CT. 3. Extensive ground-glass attenuation, tree-in-bud opacities and tiny peripheral peribronchovascular foci of consolidation throughout the right lung, most suggestive of bronchopneumonia, with etiology including atypical causes such as mycobacterial or fungal infection. 4. Mild cylindrical bronchiolectasis throughout the right lung, most prominent in the right lower lobe, new since 12/31/2011 chest CT. 5. Trace dependent right pleural effusion/thickening. 6. Mild AP window mediastinal lymphadenopathy, decreased since 12/31/2011 chest CT, probably reactive. 7. Aortic atherosclerosis. Left main and two-vessel coronary atherosclerosis . 8. Stable dilated main pulmonary artery, suggesting chronic pulmonary arterial hypertension . 9. Cholelithiasis. Electronically Signed   By: Ilona Sorrel M.D.   On: 04/07/2016 15:31   Dg Chest Port 1 View  Result Date: 04/08/2016 CLINICAL DATA:  Lung collapse EXAM: PORTABLE CHEST 1 VIEW COMPARISON:  Chest radiograph and chest CT April 07, 2016 FINDINGS: There is virtually complete collapse on the left. There is postoperative change on the left. There is consolidation in remaining left lung with what appears to be varicoid bronchiectasis in the left upper lobe, better demonstrated on CT. The small pneumothorax in the left base seen on CT 1 day prior is not evident by radiography. On the  right, there is patchy atelectatic change. Bronchiectatic change on the right is better appreciated by CT. Cardiac silhouette is within normal limits. There is aortic atherosclerosis. No adenopathy is evident on the right ; left side is obscured, and assessment for adenopathy cannot be made by radiography. There are postoperative rib defects on the left. IMPRESSION: No appreciable change radiographically compared to 1 day prior. Marked volume loss on the left with postoperative change in consolidation. Varicoid bronchiectasis in the left upper lobe is seen by radiography with better appreciated by CT. On the right, there is patchy atelectasis and areas of bronchiectatic change, better demonstrated by CT. No change in cardiac silhouette. There is aortic atherosclerosis. Electronically Signed   By: Lowella Grip III M.D.   On: 04/08/2016 07:20   Dg Chest Port 1 View  Result Date: 04/07/2016 CLINICAL DATA:  Collapsed left lung. EXAM: PORTABLE CHEST 1 VIEW COMPARISON:  04/06/2016 FINDINGS: Postoperative changes from a left thoracoplasty are again identified. There is persistent near complete opacification of the entire left hemithorax. The right lung appears hyperinflated and clear. IMPRESSION: 1. No change in aeration to the left hemithorax compared with previous exam. 2. Chronic postsurgical changes. Electronically Signed   By: Kerby Moors M.D.   On: 04/07/2016 10:27    STUDIES:  CXR 02/12 > almost complete opacification left lung (chronic changes), atelectasis. CT chest 2/15>>>Status post left lower lobectomy. Severe varicoid bronchiectasis and atelectasis diffusely replaces the collapsed left upper lobe, significantly progressed since 12/31/2011 chest CT. 2. Small chronic left-sided loculated posterior hydropneumothorax, decreased since 12/31/2011 chest CT. 3. Extensive ground-glass attenuation, tree-in-bud opacities and tiny peripheral peribronchovascular foci of consolidation throughout the  right lung, most suggestive of bronchopneumonia, with etiology including atypical causes such as mycobacterial or fungal infection. 4. Mild cylindrical bronchiolectasis throughout the right lung, most prominent in the  right lower lobe, new since 12/31/2011 chest CT. 5. Trace dependent right pleural effusion/thickening.   SIGNIFICANT EVENTS: 2/12 Admit with AonC hypoxic respiratory failure  ANTIBIOTICS: Vanco 2/12 >> 2/14 Zosyn 2/12 >>2/14 Ceftraixone 2/14 >>> Clindamycin 2/14>>>  ASSESSMENT / PLAN:  Acute on chronic hypoxic + acute hypercarbic respiratory failure - multifactorial in the setting of known bronchiectasis, severe restrictive lung disease, ILD.   Must also consider opportunistic infection.   Severe bronchiectasis - chronic; CXR now shows evolution / worsening of known left lung disease and CT from [facility] reportedly suggests possible BTX in right base as well. Severe restrictive lung disease with severely reduced diffusion capacity. Atelectasis. Probable Chronic L Hydropneumothorax  Pulmonary Hypertension associated with Connective Tissue Disease  Intermittent Silent Aspiration  DNR H/o cdiff  Plan: GOT a call back from primary pulm, appreciated will return call Appreciate ID help, AFB sent, will also dc abx She is having some loose stools, dc clinda and ctx course done No indication cdiff as of now, follow output closer Move to floor p t needed, mobilize, will ned ambulatory pulse ox as at 6 liter snow, close to home Need pt recs  Steroids reduction Dc NIMV Change schedule of home gabapentin and oxy to NOT interrupt sleep  Lavon Paganini. Titus Mould, MD, [place] Pgr: [facility] 04/09/2016 10:14 AM

## 2016-04-09 NOTE — Progress Notes (Signed)
Subjective: No new complaints   Antibiotics:  Anti-infectives    Start     Dose/Rate Route Frequency Ordered Stop   04/08/16 1600  metroNIDAZOLE (FLAGYL) tablet 500 mg  Status:  Discontinued     500 mg Oral Every 8 hours 04/08/16 1447 04/09/16 1017   04/07/16 2200  clindamycin (CLEOCIN) IVPB 600 mg  Status:  Discontinued     600 mg 100 mL/hr over 30 Minutes Intravenous Every 8 hours 04/07/16 1920 04/08/16 1446   04/07/16 1400  cefTRIAXone (ROCEPHIN) 1 g in dextrose 5 % 50 mL IVPB  Status:  Discontinued     1 g 100 mL/hr over 30 Minutes Intravenous Every 24 hours 04/07/16 1007 04/07/16 1117   04/07/16 1400  cefTRIAXone (ROCEPHIN) 2 g in dextrose 5 % 50 mL IVPB  Status:  Discontinued     2 g 100 mL/hr over 30 Minutes Intravenous Every 24 hours 04/07/16 1117 04/09/16 1017   04/05/16 2100  vancomycin (VANCOCIN) 500 mg in sodium chloride 0.9 % 100 mL IVPB  Status:  Discontinued     500 mg 100 mL/hr over 60 Minutes Intravenous Every 12 hours 04/05/16 0944 04/07/16 1007   04/05/16 1400  piperacillin-tazobactam (ZOSYN) IVPB 3.375 g  Status:  Discontinued     3.375 g 12.5 mL/hr over 240 Minutes Intravenous Every 8 hours 04/05/16 0944 04/07/16 1007   04/05/16 0930  vancomycin (VANCOCIN) IVPB 1000 mg/200 mL premix     1,000 mg 200 mL/hr over 60 Minutes Intravenous  Once 04/05/16 0921 04/05/16 1218   04/05/16 0930  piperacillin-tazobactam (ZOSYN) IVPB 3.375 g     3.375 g 100 mL/hr over 30 Minutes Intravenous  Once 04/05/16 0921 04/05/16 1028      Medications: Scheduled Meds: . arformoterol  15 mcg Nebulization BID  . atorvastatin  10 mg Oral Daily  . budesonide (PULMICORT) nebulizer solution  0.5 mg Nebulization BID  . DULoxetine  60 mg Oral QPM  . enoxaparin (LOVENOX) injection  40 mg Subcutaneous Q24H  . furosemide  40 mg Oral Once per day on Mon Wed Fri  . gabapentin  400 mg Oral Q6H  . guaiFENesin  600 mg Oral BID  . lidocaine  1 patch Transdermal Q24H  . mouth rinse   15 mL Mouth Rinse BID  . methylPREDNISolone (SOLU-MEDROL) injection  20 mg Intravenous Q12H  . mycophenolate  1,000 mg Oral BID  . oxyCODONE  5 mg Oral Q6H  . potassium chloride SA  20 mEq Oral Once per day on Mon Wed Fri  . sodium chloride flush  3 mL Intravenous Q12H  . tadalafil (PAH)  40 mg Oral QHS  . Treprostinil  18 mcg Inhalation QID   Continuous Infusions: . sodium chloride 10 mL/hr at 04/06/16 1600   PRN Meds:.acetaminophen **OR** acetaminophen, albuterol, ALPRAZolam, famotidine, ipratropium-albuterol, loperamide, promethazine    Objective: Weight change:   Intake/Output Summary (Last 24 hours) at 04/09/16 1726 Last data filed at 04/09/16 1300  Gross per 24 hour  Intake              450 ml  Output                0 ml  Net              450 ml   Blood pressure (!) 108/97, pulse (!) 116, temperature 98.4 F (36.9 C), temperature source Oral, resp. rate 19, height _0  (1.651 m), weight 118 lb  9.7 oz (53.8 kg), SpO2 97 %. Temp:  [98 F (36.7 C)-98.7 F (37.1 C)] 98.4 F (36.9 C) (02/16 1500) Pulse Rate:  [91-129] 116 (02/16 1500) Resp:  [15-28] 19 (02/16 1500) BP: (108-180)/(68-97) 108/97 (02/16 1500) SpO2:  [87 %-100 %] 97 % (02/16 1500)  Physical Exam: General: Alert and awake, oriented x3, not in any acute distress. HEENT: anicteric sclera,  EOMI, oropharynx clear and without exudate Cardiovascular: egular rate, normal r,  no murmur rubs or gallops Pulmonary: Long expiratory phase with crackles at the bases  Gastrointestinal: soft nontender, nondistended, normal bowel sounds, Musculoskeletal: no  clubbing or edema noted bilaterally Skin, soft tissue: no rashes Neuro: nonfocal, strength and sensation intact   CBC:   CBC Latest Ref Rng & Units 04/06/2016 04/05/2016 04/05/2016  WBC 4.0 - 10.5 K/uL 4.5 - 10.5  Hemoglobin 12.0 - 15.0 g/dL 8.6(L) 10.5(L) 9.2(L)  Hematocrit 36.0 - 46.0 % 28.6(L) 31.0(L) 31.6(L)  Platelets 150 - 400 K/uL 279 - 385       BMET  Recent Labs  04/08/16 0143 04/09/16 0652  NA 139 141  K 3.5 3.7  CL 97* 99*  CO2 34* 33*  GLUCOSE 140* 101*  BUN 26* 22*  CREATININE 0.77 0.69  CALCIUM 8.7* 8.7*     Liver Panel  No results for input(s): PROT, ALBUMIN, AST, ALT, ALKPHOS, BILITOT, BILIDIR, IBILI in the last 72 hours.     Sedimentation Rate No results for input(s): ESRSEDRATE in the last 72 hours. C-Reactive Protein No results for input(s): CRP in the last 72 hours.  Micro Results: Recent Results (from the past 720 hour(s))  Blood culture (routine x 2)     Status: None (Preliminary result)   Collection Time: 04/05/16  9:14 AM  Result Value Ref Range Status   Specimen Description BLOOD RIGHT ANTECUBITAL  Final   Special Requests BOTTLES DRAWN AEROBIC ONLY  5CC  Final   Culture NO GROWTH 4 DAYS  Final   Report Status PENDING  Incomplete  Blood culture (routine x 2)     Status: None (Preliminary result)   Collection Time: 04/05/16  9:21 AM  Result Value Ref Range Status   Specimen Description BLOOD RIGHT FOREARM  Final   Special Requests BOTTLES DRAWN AEROBIC ONLY  5CC  Final   Culture NO GROWTH 4 DAYS  Final   Report Status PENDING  Incomplete  Respiratory Panel by PCR     Status: Abnormal   Collection Time: 04/05/16 12:42 PM  Result Value Ref Range Status   Adenovirus NOT DETECTED NOT DETECTED Final   Coronavirus 229E NOT DETECTED NOT DETECTED Final   Coronavirus HKU1 NOT DETECTED NOT DETECTED Final   Coronavirus NL63 NOT DETECTED NOT DETECTED Final   Coronavirus OC43 NOT DETECTED NOT DETECTED Final   Metapneumovirus NOT DETECTED NOT DETECTED Final   Rhinovirus / Enterovirus NOT DETECTED NOT DETECTED Final   Influenza A NOT DETECTED NOT DETECTED Final   Influenza B NOT DETECTED NOT DETECTED Final   Parainfluenza Virus 1 NOT DETECTED NOT DETECTED Final   Parainfluenza Virus 2 NOT DETECTED NOT DETECTED Final   Parainfluenza Virus 3 NOT DETECTED NOT DETECTED Final   Parainfluenza  Virus 4 NOT DETECTED NOT DETECTED Final   Respiratory Syncytial Virus DETECTED (A) NOT DETECTED Final    Comment: CRITICAL RESULT CALLED TO, READ BACK BY AND VERIFIED WITH: Milon Dikes RN 17:20 04/05/16 ([email or web address])    Bordetella pertussis NOT DETECTED NOT DETECTED Final   Chlamydophila pneumoniae NOT DETECTED  NOT DETECTED Final   Mycoplasma pneumoniae NOT DETECTED NOT DETECTED Final  MRSA PCR Screening     Status: None   Collection Time: 04/05/16  8:09 PM  Result Value Ref Range Status   MRSA by PCR NEGATIVE NEGATIVE Final    Comment:        The GeneXpert MRSA Assay (FDA approved for NASAL specimens only), is one component of a comprehensive MRSA colonization surveillance program. It is not intended to diagnose MRSA infection nor to guide or monitor treatment for MRSA infections.   Culture, respiratory (NON-Expectorated)     Status: None (Preliminary result)   Collection Time: 04/08/16  4:32 PM  Result Value Ref Range Status   Specimen Description TRACHEAL ASPIRATE  Final   Special Requests NONE  Final   Gram Stain   Final    RARE WBC PRESENT, PREDOMINANTLY MONONUCLEAR RARE GRAM POSITIVE RODS RARE GRAM POSITIVE COCCI IN PAIRS    Culture CULTURE REINCUBATED FOR BETTER GROWTH  Final   Report Status PENDING  Incomplete    Studies/Results: Dg Chest Port 1 View  Result Date: 04/08/2016 CLINICAL DATA:  Lung collapse EXAM: PORTABLE CHEST 1 VIEW COMPARISON:  Chest radiograph and chest CT April 07, 2016 FINDINGS: There is virtually complete collapse on the left. There is postoperative change on the left. There is consolidation in remaining left lung with what appears to be varicoid bronchiectasis in the left upper lobe, better demonstrated on CT. The small pneumothorax in the left base seen on CT 1 day prior is not evident by radiography. On the right, there is patchy atelectatic change. Bronchiectatic change on the right is better appreciated by CT. Cardiac silhouette is within normal  limits. There is aortic atherosclerosis. No adenopathy is evident on the right ; left side is obscured, and assessment for adenopathy cannot be made by radiography. There are postoperative rib defects on the left. IMPRESSION: No appreciable change radiographically compared to 1 day prior. Marked volume loss on the left with postoperative change in consolidation. Varicoid bronchiectasis in the left upper lobe is seen by radiography with better appreciated by CT. On the right, there is patchy atelectasis and areas of bronchiectatic change, better demonstrated by CT. No change in cardiac silhouette. There is aortic atherosclerosis. Electronically Signed   By: Lowella Grip III M.D.   On: 04/08/2016 07:20      Assessment/Plan:  INTERVAL HISTORY: AFB sputa obtained   Active Problems:   Acute respiratory failure (HCC)   Carcinoid tumor of left lung   S/P partial lobectomy of lung   Chronic pain   Acute on chronic respiratory failure with hypoxia and hypercapnia (HCC)   Hyperglycemia   Sepsis (HCC)   Acute respiratory distress   Respiratory syncytial virus (RSV) infection   Dyspnea   Lung collapse    Deborah [name] is a 66 y.o. female with  carcinoid tumor of the left lung s/p resection in 9323 complicated by BP fistula s/p repair, ILD, severe bronchiectasis and CREST admitted 2/12 with acute on chronic hypoxic respiratory failure in the setting of suspected bronchiectasis exacerbation and now found to have RSV infection. +/- Less likely bacterial infection with question of whether she could be suffering from mycobacterial infection.  #1 RSV infection: This seems to be getting better I don't see the point of starting ribavirin at this point  #2 possible superimposed bacterial infection: I'm skeptical this was case and now she has received 5 days of therapy and they have been DC  #  3 possible M. avium: would like to prove this microbiologically before considering treating  I will  sign off for now please call with further questions.   LOS: 4 days   Alcide Evener 04/09/2016, 5:26 PM

## 2016-04-10 DIAGNOSIS — B974 Respiratory syncytial virus as the cause of diseases classified elsewhere: Secondary | ICD-10-CM

## 2016-04-10 LAB — BASIC METABOLIC PANEL
ANION GAP: 6 (ref 5–15)
BUN: 21 mg/dL — ABNORMAL HIGH (ref 6–20)
CALCIUM: 8.9 mg/dL (ref 8.9–10.3)
CO2: 35 mmol/L — ABNORMAL HIGH (ref 22–32)
CREATININE: 0.63 mg/dL (ref 0.44–1.00)
Chloride: 99 mmol/L — ABNORMAL LOW (ref 101–111)
Glucose, Bld: 113 mg/dL — ABNORMAL HIGH (ref 65–99)
Potassium: 3.5 mmol/L (ref 3.5–5.1)
SODIUM: 140 mmol/L (ref 135–145)

## 2016-04-10 LAB — CULTURE, BLOOD (ROUTINE X 2)
CULTURE: NO GROWTH
Culture: NO GROWTH

## 2016-04-10 LAB — MAGNESIUM: Magnesium: 2.1 mg/dL (ref 1.7–2.4)

## 2016-04-10 MED ORDER — GABAPENTIN 400 MG PO CAPS
400.0000 mg | ORAL_CAPSULE | Freq: Four times a day (QID) | ORAL | Status: DC
  Administered 2016-04-10 – 2016-04-12 (×9): 400 mg via ORAL
  Filled 2016-04-10 (×10): qty 1

## 2016-04-10 MED ORDER — PREDNISONE 20 MG PO TABS
20.0000 mg | ORAL_TABLET | Freq: Every day | ORAL | Status: DC
  Administered 2016-04-11 – 2016-04-12 (×2): 20 mg via ORAL
  Filled 2016-04-10 (×2): qty 1

## 2016-04-10 MED ORDER — IPRATROPIUM-ALBUTEROL 0.5-2.5 (3) MG/3ML IN SOLN: 3.0000 mL | Freq: Four times a day (QID) | RESPIRATORY_TRACT | Status: DC | PRN

## 2016-04-10 NOTE — Progress Notes (Signed)
Physical Therapy Treatment Patient Details Name: Deborah [name] MRN: [id number] DOB: [birth year] Today's Date: 04/10/2016    History of Present Illness Pt is a 66 y/o female admitted secondary to cough, fever, SOB, acute on chronic respiratory failure requiring BiPAP (?RSV). PMH including but not limited to carcinoid tumor in 2004 s/p resection of near entire L lung, Raynaud's, CAD, CHF and HTN.    PT Comments    Pt presented sitting OOB in recliner when PT entered room. Pt making good progress with mobility and able to ambulate greater distance this session with improved physiological responses (see below for details). Pt would continue to benefit from skilled physical therapy services at this time while admitted and after d/c to address her limitations in order to improve her overall safety and independence with functional mobility.    Follow Up Recommendations  Home health PT;Other (comment) (eventually progressing back to Cardiac Rehab)     Equipment Recommendations  None recommended by PT    Recommendations for Other Services       Precautions / Restrictions Precautions Precaution Comments: monitor SPO2 and HR with activity Restrictions Weight Bearing Restrictions: No    Mobility  Bed Mobility               General bed mobility comments: pt sitting OOB in recliner when PT entered room  Transfers Overall transfer level: Needs assistance Equipment used: Rolling walker (2 wheeled) Transfers: Sit to/from Stand Sit to Stand: Supervision         General transfer comment: increased time, good technique, supervision for safety  Ambulation/Gait Ambulation/Gait assistance: Supervision Ambulation Distance (Feet): 100 Feet Assistive device: Rolling walker (2 wheeled) Gait Pattern/deviations: Step-through pattern;Decreased step length - right;Decreased step length - left;Decreased stride length Gait velocity: decreased Gait velocity interpretation: Below normal  speed for age/gender General Gait Details: pt required standing rest breaks x3 secondary to fatigue. pt demonstrated safety and stability with use of RW, no LOB or physical assistance needed, supervision for safety.  Pt ambulated on 4.5L of O2 with SPO2 maintaining >94% throughout.   Stairs            Wheelchair Mobility    Modified Rankin (Stroke Patients Only)       Balance Overall balance assessment: Needs assistance Sitting-balance support: Feet supported Sitting balance-Leahy Scale: Good     Standing balance support: During functional activity;No upper extremity supported Standing balance-Leahy Scale: Fair Standing balance comment: pt tolerated multiple standing rest breaks while continuing to carry on conversation with therapist while SPO2 maintained >94%                    Cognition Arousal/Alertness: Awake/alert Behavior During Therapy: WFL for tasks assessed/performed;Anxious;Flat affect Overall Cognitive Status: Within Functional Limits for tasks assessed                      Exercises      General Comments        Pertinent Vitals/Pain Pain Assessment: No/denies pain    Home Living                      Prior Function            PT Goals (current goals can now be found in the care plan section) Acute Rehab PT Goals Patient Stated Goal: return home PT Goal Formulation: With patient Time For Goal Achievement: 04/23/16 Potential to Achieve Goals: Good Progress towards PT goals: Progressing toward goals  Frequency    Min 3X/week      PT Plan Discharge plan needs to be updated    Co-evaluation             End of Session Equipment Utilized During Treatment: Gait belt;Oxygen (4.5L of O2) Activity Tolerance: Patient tolerated treatment well Patient left: in chair;with call bell/phone within reach;with family/visitor present     Time: 3606-7703 PT Time Calculation (min) (ACUTE ONLY): 51 min  Charges:   $Gait Training: 23-37 mins $Therapeutic Activity: 8-22 mins                    G CodesClearnce Sorrel Wilhelmena Zea May 05, 2016, 5:01 PM Sherie Don, PT, DPT [phone]

## 2016-04-10 NOTE — Progress Notes (Signed)
Name: Deborah [name] MRN: [id number] DOB: [birth year]    ADMISSION DATE:  04/05/2016 CONSULTATION DATE:  04/05/16  REFERRING MD :  Melina Copa - EDP  CHIEF COMPLAINT:  SOB   BRIEF SUMMARY: 66 y/o F with PMH of carcinoid tumor of the left lung s/p resection in 2774 complicated by BP fistula s/p repair, ILD, severe bronchiectasis and CREST admitted 2/12 with acute on chronic hypoxic respiratory failure in the setting of suspected bronchiectasis exacerbation. RSV positive 2/12.   She sees Dr. Corrin Parker at [facility] and had admission to [facility] in November 2017 with parainfluenza 1.  During that admission, it was felt that she was experiencing silent aspiration (1 swallow eval demonstrated aspiration while the 2nd did not.  Both studies did demonstrate esophageal dysmotility however).  Latest PFT's from May 2017:  FVC 1.32 (42% pred), FEV1 0.96 (39% pred), ratio 72.55%, TLC 36% pred, DLCO 5.35 (28%).   SUBJECTIVE:  Up to chair Feels better Last BiPAp was Wednesday  VITAL SIGNS: Temp:  [97.7 F (36.5 C)-98.5 F (36.9 C)] 97.7 F (36.5 C) (02/17 0451) Pulse Rate:  [92-129] 92 (02/17 0451) Resp:  [19-28] 19 (02/17 0451) BP: (105-158)/(68-97) 151/71 (02/17 0451) SpO2:  [87 %-100 %] 100 % (02/17 0743)  PHYSICAL EXAMINATION: General:  Ill appearing but stable, up to chair HEENT: no stridor, OP clear Neuro: non-focal, moving all ext CV: regular, no M PULM: coarse BS bilaterally, no wheeze GI: soft and benign Extremities: no edema Skin:  No rash    Recent Labs Lab 04/08/16 0143 04/09/16 0652 04/10/16 0724  NA 139 141 140  K 3.5 3.7 3.5  CL 97* 99* 99*  CO2 34* 33* 35*  BUN 26* 22* 21*  CREATININE 0.77 0.69 0.63  GLUCOSE 140* 101* 113*    Recent Labs Lab 04/05/16 0842 04/05/16 0904 04/06/16 0112  HGB 9.2* 10.5* 8.6*  HCT 31.6* 31.0* 28.6*  WBC 10.5  --  4.5  PLT 385  --  279   No results found.  STUDIES:  CXR 02/12 > almost complete opacification left lung (chronic changes),  atelectasis. CT chest 2/15>>>Status post left lower lobectomy. Severe varicoid bronchiectasis and atelectasis diffusely replaces the collapsed left upper lobe, significantly progressed since 12/31/2011 chest CT. 2. Small chronic left-sided loculated posterior hydropneumothorax, decreased since 12/31/2011 chest CT. 3. Extensive ground-glass attenuation, tree-in-bud opacities and tiny peripheral peribronchovascular foci of consolidation throughout the right lung, most suggestive of bronchopneumonia, with etiology including atypical causes such as mycobacterial or fungal infection. 4. Mild cylindrical bronchiolectasis throughout the right lung, most prominent in the right lower lobe, new since 12/31/2011 chest CT. 5. Trace dependent right pleural effusion/thickening.   SIGNIFICANT EVENTS: 2/12 Admit with AonC hypoxic respiratory failure  ANTIBIOTICS: Vanco 2/12 >> 2/14 Zosyn 2/12 >>2/14 Ceftraixone 2/14 >>> 2/16 Clindamycin 2/14>>> 2/16  ASSESSMENT / PLAN:  Acute on chronic hypoxic + acute hypercarbic respiratory failure - multifactorial in the setting of known bronchiectasis, severe restrictive lung disease, ILD, acute RSV  Severe bronchiectasis - chronic; CXR now shows evolution / worsening of known left lung disease and CT from [facility] reportedly suggests possible BTX in right base as well. Severe restrictive lung disease with severely reduced diffusion capacity. Atelectasis. Probable Chronic L Hydropneumothorax  Pulmonary Hypertension associated with Connective Tissue Disease  Intermittent Silent Aspiration  DNR H/o cdiff  Plan: AFB cx sent and pending to eval for possible MAIC colonization; if isolated then would have to consider long term outpt therapy pulmicort + brovana >> back to [place]  soon Chest PT abx stopped 2/16 Continue PT Wean O2 as able Continue tadalafil and tyvaso as ordered Change solumedrol to PO and plan wean back to her home 64m qd Continue cellcept ?  Home Monday  RBaltazar Apo MD, PhD 04/10/2016, 9:56 AM [place] [facility] [phone] if no answer [phone]

## 2016-04-11 LAB — CULTURE, RESPIRATORY W GRAM STAIN

## 2016-04-11 LAB — CULTURE, RESPIRATORY: CULTURE: NORMAL

## 2016-04-11 NOTE — Progress Notes (Signed)
Name: Deborah [name] MRN: [id number] DOB: [birth year]    ADMISSION DATE:  04/05/2016 CONSULTATION DATE:  04/05/16  REFERRING MD :  Melina Copa - EDP  CHIEF COMPLAINT:  SOB   BRIEF SUMMARY: 66 y/o F with PMH of carcinoid tumor of the left lung s/p resection in 4010 complicated by BP fistula s/p repair, ILD, severe bronchiectasis and CREST admitted 2/12 with acute on chronic hypoxic respiratory failure in the setting of suspected bronchiectasis exacerbation. RSV positive 2/12.   She sees Dr. Corrin Parker at [facility] and had admission to [facility] in November 2017 with parainfluenza 1.  During that admission, it was felt that she was experiencing silent aspiration (1 swallow eval demonstrated aspiration while the 2nd did not.  Both studies did demonstrate esophageal dysmotility however).  Latest PFT's from May 2017:  FVC 1.32 (42% pred), FEV1 0.96 (39% pred), ratio 72.55%, TLC 36% pred, DLCO 5.35 (28%).   SUBJECTIVE:  Up to chair Having more back pain in the afternoons, makes it difficult to eat dinner Coughed up scant mucous this am  VITAL SIGNS: Temp:  [97.8 F (36.6 C)-98.1 F (36.7 C)] 98.1 F (36.7 C) (02/18 0648) Pulse Rate:  [95-118] 95 (02/18 0648) Resp:  [18] 18 (02/18 0648) BP: (111-149)/(58-65) 149/58 (02/18 0648) SpO2:  [96 %-99 %] 96 % (02/18 0648)  PHYSICAL EXAMINATION: General: up to chair, eating HEENT: no stridor, OP clear Neuro: non-focal, interacts appropriately CV: regular , no M PULM: coarse b, no wheeze GI: soft and benign Extremities: no edema Skin:  No rash    Recent Labs Lab 04/08/16 0143 04/09/16 0652 04/10/16 0724  NA 139 141 140  K 3.5 3.7 3.5  CL 97* 99* 99*  CO2 34* 33* 35*  BUN 26* 22* 21*  CREATININE 0.77 0.69 0.63  GLUCOSE 140* 101* 113*    Recent Labs Lab 04/05/16 0842 04/05/16 0904 04/06/16 0112  HGB 9.2* 10.5* 8.6*  HCT 31.6* 31.0* 28.6*  WBC 10.5  --  4.5  PLT 385  --  279   No results found.  STUDIES:  CXR 02/12 > almost complete  opacification left lung (chronic changes), atelectasis. CT chest 2/15>>>Status post left lower lobectomy. Severe varicoid bronchiectasis and atelectasis diffusely replaces the collapsed left upper lobe, significantly progressed since 12/31/2011 chest CT. 2. Small chronic left-sided loculated posterior hydropneumothorax, decreased since 12/31/2011 chest CT. 3. Extensive ground-glass attenuation, tree-in-bud opacities and tiny peripheral peribronchovascular foci of consolidation throughout the right lung, most suggestive of bronchopneumonia, with etiology including atypical causes such as mycobacterial or fungal infection. 4. Mild cylindrical bronchiolectasis throughout the right lung, most prominent in the right lower lobe, new since 12/31/2011 chest CT. 5. Trace dependent right pleural effusion/thickening.   SIGNIFICANT EVENTS: 2/12 Admit with AonC hypoxic respiratory failure  ANTIBIOTICS: Vanco 2/12 >> 2/14 Zosyn 2/12 >>2/14 Ceftraixone 2/14 >>> 2/16 Clindamycin 2/14>>> 2/16  ASSESSMENT / PLAN:  Acute on chronic hypoxic + acute hypercarbic respiratory failure - multifactorial in the setting of known bronchiectasis, severe restrictive lung disease, ILD, acute RSV  Severe bronchiectasis - chronic; CXR now shows evolution / worsening of known left lung disease and CT from [facility] reportedly suggests possible BTX in right base as well. Severe restrictive lung disease with severely reduced diffusion capacity. Atelectasis. Probable Chronic L Hydropneumothorax  Pulmonary Hypertension associated with Connective Tissue Disease  Intermittent Silent Aspiration  DNR H/o cdiff  Plan: Follow ABF cx data sputum pulmicort + brovana, back to [place] for home.  Continue chest PT, other PT. They recommend  HH PT for a few sessions.  Continue tadalafil, home tyvaso Wean pred over several days back to her baseline 39m qd Continue cellcept Home on 2/19  RBaltazar Apo MD, PhD 04/11/2016, 9:27  AM [place] [facility] [phone] if no answer [phone]

## 2016-04-12 ENCOUNTER — Encounter (HOSPITAL_COMMUNITY): Payer: BLUE CROSS/BLUE SHIELD

## 2016-04-12 DIAGNOSIS — J9601 Acute respiratory failure with hypoxia: Secondary | ICD-10-CM

## 2016-04-12 MED ORDER — PREDNISONE 10 MG PO TABS: ORAL_TABLET | ORAL | 0 refills | Status: DC

## 2016-04-12 NOTE — Discharge Summary (Signed)
Physician Discharge Summary       Patient ID: Deborah [name] MRN: [id number] DOB/AGE: 64/24/1952 64 y.o.  Admit date: 04/05/2016 Discharge date: 04/12/2016  Discharge Diagnoses:   CREST ILD PAH Bronchiectasis Acute on chronic hypoxic/hypercarbic respiratory failure  RSV infection  H/o carcinoid tumor of left lung s/p resection 2004 H/o bronchopleueral fistula s/p repair Severe restrictive lung disease.   Detailed Hospital Course:   66 y/o F with PMH of carcinoid tumor of the left lung s/p resection in 4536 complicated by BP fistula s/p repair, ILD, severe bronchiectasis and CREST admitted 2/12 with acute on chronic hypoxic respiratory failure in the setting of suspected bronchiectasis exacerbation. RSV positive 2/12.   She sees Dr. Corrin Parker at [facility] and had admission to [facility] in November 2017 with parainfluenza 1.  During that admission, it was felt that she was experiencing silent aspiration (1 swallow eval demonstrated aspiration while the 2nd did not.  Both studies did demonstrate esophageal dysmotility however).  Latest PFT's from May 2017:  FVC 1.32 (42% pred), FEV1 0.96 (39% pred), ratio 72.55%, TLC 36% pred, DLCO 5.35 (28%).  She was admitted to the intensive care. Therapeutic interventions included: NIPPV, supplemental oxygen,  Initially was treated w/ systemic steroids, pulmonary hygiene measures (including BDs and flutter valve) as well as empiric antibiotics as culture data was pending. These were stopped on 2/16. She made slow but gradual improvement. Her steroid dosing was decreased and she was deemed ready for dc as of 2/19. She will be discharged to home w/ the plan of care as outlined below.         Discharge Plan by active problems   Acute on chronic hypoxic and hypercarbic respiratory failure in setting of RSV infection superimposed on underlying bronchiectasis, and ILD and severe restrictive lung disease d/t connective tissue disease  Plan Dc to home on oxygen 4-6  liters Slow pred taper back to 50m/d Continue cellcept  Resume dulera at home as well as nebulized 3% saline (for her bronchiectasis) [place] [facility] Plan Continue tadalafil and tyvaso  F[facility]u [facility]  Probable chronic left hydropneumothorax  [place] PT at [place]       Significant Hospital tests/ studies  Consults   Discharge Exam: BP (!) 151/55 (BP Location: Right Arm)   Pulse 88   Temp 98.4 F (36.9 C) (Oral)   Resp 17   Ht _0  (1.651 m)   Wt 118 lb 9.7 oz (53.8 kg)   SpO2 100%   BMI 19.74 kg/m   General appearance: 66Year old  female, well nourished NAD,conversant  Eyes: anicteric sclerae icteric , moist conjunctivae; PERRL, EOMI bilaterally. Mouth:  membranes and no mucosal ulcerations; normal hard and soft palate Neck: Trachea midline; neck supple, no JVD Lungs/chest: diffuse rales, with normal respiratory effort and no intercostal retractions CV: RRR, no MRGs  Abdomen: Soft, non-tender; no masses or HSM Extremities: No peripheral edema or extremity lymphadenopathy Skin: Normal temperature, turgor and texture; no rash, ulcers or subcutaneous nodules Neuro/Psych: Appropriate affect, alert and oriented to person, place and time   Labs at discharge Lab Results  Component Value Date   CREATININE 0.63 04/10/2016   BUN 21 (H) 04/10/2016   NA 140 04/10/2016   K 3.5 04/10/2016   CL 99 (L) 04/10/2016   CO2 35 (H) 04/10/2016   Lab Results  Component Value Date   WBC 4.5 04/06/2016   HGB 8.6 (L) 04/06/2016   HCT 28.6 (L) 04/06/2016   MCV 88.8 04/06/2016   PLT 279 04/06/2016   Lab Results  Component Value Date   ALT 26 04/05/2016   AST 34 04/05/2016   ALKPHOS 94 04/05/2016   BILITOT 0.5 04/05/2016   Lab Results  Component Value Date   INR 1.04 01/03/2012    Current radiology studies No results  found.  Disposition:  01-Home or Self Care   Allergies as of 04/12/2016      Reactions   Megace Es [megestrol Acetate]    Severe leg cramps   Megestrol Acetate    REACTION: rash   Lodine [etodolac] Rash   REACTION: rash      Medication List    STOP taking these medications   cholestyramine light 4 GM/DOSE powder Commonly known as:  PREVALITE   PredniSONE 5 MG Tbec Replaced by:  predniSONE 10 MG tablet     TAKE these medications   ADCIRCA 20 MG tablet Generic drug:  tadalafil (PAH) Take 40 mg by mouth at bedtime.   albuterol 108 (90 Base) MCG/ACT inhaler Commonly known as:  PROVENTIL HFA;VENTOLIN HFA Inhale 2 puffs into the lungs every 6 (six) hours as needed. For shortness of breath.   atorvastatin 10 MG tablet Commonly known as:  LIPITOR Take 10 mg by mouth daily.   cholecalciferol 1000 units tablet Commonly known as:  VITAMIN D Take 2,000 Units by mouth 2 (two) times a week.   denosumab 60 MG/ML Soln injection Commonly known as:  PROLIA Inject 60 mg into the skin every 6 (six) months. Administer in upper arm, thigh, or abdomen   DULoxetine 60 MG capsule Commonly known as:  CYMBALTA Take 60 mg by mouth every evening.   furosemide 40 MG tablet Commonly known as:  LASIX Take 40 mg by mouth 3 (three) times a week. M-W-F   gabapentin 400 MG capsule Commonly known as:  NEURONTIN Take 400 mg by mouth 4 (four) times daily.   guaiFENesin 600 MG 12 hr tablet Commonly known as:  MUCINEX Take 600 mg by mouth 2 (two) times daily.   lidocaine 5 % Commonly known as:  LIDODERM Place 1 patch onto the skin daily. Remove & Discard patch within 12 hours or as directed by MD   mometasone-formoterol 100-5 MCG/ACT Aero Commonly known as:  DULERA Inhale 2 puffs into the lungs 2 (two) times daily.   multivitamin with minerals Tabs tablet Take 1 tablet by mouth daily.   mycophenolate 500 MG tablet Commonly known as:  CELLCEPT Take 1,000 mg by mouth 2 (two) times  daily.   oxycodone 5 MG capsule Commonly known as:  OXY-IR Take 5 mg by mouth every 6 (six) hours as needed.   OXYGEN Inhale into the lungs. Continuous, 4 L/min when exercising and 6 L/min when sitting   pantoprazole 40 MG tablet Commonly known as:  PROTONIX Take 40 mg by mouth daily.   Polyethyl Glycol-Propyl Glycol 0.4-0.3 % Soln Apply 1 drop to eye at bedtime as needed (dry).   potassium chloride SA 20 MEQ tablet Commonly known as:  K-DUR,KLOR-CON Take 20 mEq by mouth 3 (three) times a week. M-W-F   predniSONE 10 MG tablet Commonly known as:  DELTASONE Take two tabs daily for 5 days, then 1 tab daily for 5 days Replaces:  PredniSONE 5 MG Tbec   saccharomyces boulardii 250 MG capsule Commonly known as:  [employer]  Take 1 capsule (250 mg total) by mouth 2 (two) times daily.   sodium chloride HYPERTONIC 3 % nebulizer solution Take 3 mLs by nebulization daily.   Treprostinil 0.6 MG/ML Soln Commonly known as:  TYVASO Inhale 18 mcg into the lungs 4 (four) times daily. 1.74m/2.9mL   (12breaths)   vitamin B-12 1000 MCG tablet Commonly known as:  CYANOCOBALAMIN Take 1,000 mcg by mouth 2 (two) times a week. Tues and Thurs      Follow-up Information    BCollene Gobble, MD Follow up on 05/19/2016.   Specialty:  Pulmonary Disease Why:  at 330 pm  Contact information: [id number] NP Follow up on 04/21/2016.   Specialty:  Pulmonary Disease Why:  330 pm  Contact information: [address] FZ[phone]           Discharged Condition: good  Physician Statement:   The Patient was personally examined, the discharge assessment and plan has been personally reviewed and I agree with ACNP Babcock's assessment and plan. 45 minutes of time have been dedicated to discharge assessment, planning and discharge instructions.   Signed: PClementeen Graham2/19/2018, 12:45 PM

## 2016-04-12 NOTE — Care Management Note (Addendum)
Case Management Note  Patient Details  Name: Deborah [name] MRN: [id number] Date of Birth: 02-24-1950  Subjective/Objective:                    Action/Plan:  Received order for HHPT, referral given to Santiago Glad with [facility] Patient has home oxygen through [employer] and spouse has portable tanks for transportation home. PT recommending HHPT, patient and spouse in agreement . Choice offered they would like [facility].   Will need MD order and face to face to arrange. Expected Discharge Date:                  Expected Discharge Plan:  [place]  In-House Referral:     Discharge planning Services     Post Acute Care Choice:  Home Health Choice offered to:  Patient, Spouse  DME Arranged:    DME Agency:     HH Arranged:   PT [place]:  [place]  Status of Service: completed If discussed at [place] of Stay Meetings, dates discussed:    Additional Comments:  Marilu Favre, RN 04/12/2016, 10:38 AM

## 2016-04-12 NOTE — Progress Notes (Addendum)
Physical Therapy Treatment Patient Details Name: Deborah [name] MRN: [id number] DOB: 21-Nov-1950 Today's Date: 04/12/2016    History of Present Illness Pt is a 66 y/o female admitted secondary to cough, fever, SOB, acute on chronic respiratory failure requiring BiPAP (?RSV). PMH including but not limited to carcinoid tumor in 2004 s/p resection of near entire L lung, Raynaud's, CAD, CHF and HTN.    PT Comments    Pt is able to ambulate 50-75' with slow, steady safe gait pushing a w/c but is limited by increases in her HR  resting HR 110-117 but elevates to 130 bpm with ambulation. Pt is good at limiting her activity and allowing her HR to recover with standing or seated rest breaks before continuing ambulation. Pt requires skilled PT to improve her mobility and endurance to be able to safely function in her home environment.     Follow Up Recommendations  Home health PT;Other (comment) (eventually progressing back to Cardiac Rehab)     Equipment Recommendations  None recommended by PT    Recommendations for Other Services       Precautions / Restrictions Precautions Precautions: None;Other (comment) (monitor SPO2 and HR during ambulation) Precaution Comments: monitor SPO2 and HR with activity Restrictions Weight Bearing Restrictions: No    Mobility  Bed Mobility    General bed mobility comments: pt sitting OOB in recliner when PT entered room  Transfers Overall transfer level: Modified independent Equipment used: Rolling walker (2 wheeled);None Transfers: Sit to/from Stand Sit to Stand: Supervision         General transfer comment: increased time, good technique, supervision for safety  Ambulation/Gait Ambulation/Gait assistance: Supervision Ambulation Distance (Feet): 240 Feet ((with standing or seated rest break every 50-75' for HR reco) Assistive device: Pushed wheelchair Gait Pattern/deviations: Step-through pattern;Decreased step length - right;Decreased step  length - left;Decreased stride length Gait velocity: decreased Gait velocity interpretation: Below normal speed for age/gender General Gait Details: pt able to ambulate 240 total feet in two bouts. Pt required standing or seated rest breaks every 50-75 ' fro HR to recover from 130 bpm to low 120's. Pt required mask to be out of room and felt that was adding to increased HR. so returned to room inbetween walking bouts and was given meds by nursing during rest period. Pt ambulation very slow but steady and safe with no LOB or physical assistance needed.  (slow but steady ambulation no LoB, no buckling HR limited)     Balance Overall balance assessment: Independent Sitting-balance support: Feet supported Sitting balance-Leahy Scale: Good     Standing balance support: During functional activity;No upper extremity supported Standing balance-Leahy Scale: Fair Standing balance comment: pt able to take standing rest breaks without UE support maintaining SaO2 >96%                     Cognition Arousal/Alertness: Awake/alert Behavior During Therapy: WFL for tasks assessed/performed;Anxious;Flat affect Overall Cognitive Status: Within Functional Limits for tasks assessed                      Exercises      General Comments General comments (skin integrity, edema, etc.): (P) Pt continues to be limited by increases in HR which she is able to self monitor and takes rest breaks accordingly.      Pertinent Vitals/Pain Pain Assessment: (P) 0-10  Resting HR 110-117 bpm elevated to 130 bpm with ambulation recovers slowly with rest. SaO2 on 6 L O2 >95% throughout  session including during ambulation.      Home Living                      Prior Function            PT Goals (current goals can now be found in the care plan section) Acute Rehab PT Goals Patient Stated Goal: return home PT Goal Formulation: With patient Time For Goal Achievement: 04/23/16 Potential to  Achieve Goals: Good    Frequency    Min 3X/week      PT Plan Current plan remains appropriate    Co-evaluation             End of Session Equipment Utilized During Treatment: Gait belt;Oxygen (6L of O2) Activity Tolerance: Patient tolerated treatment well Patient left: in chair;with call bell/phone within reach;with family/visitor present Nurse Communication: Mobility status       Time: 0925-1010 PT Time Calculation (min) (ACUTE ONLY): 45 min  Charges:  $Gait Training: 38-52 mins                    G Codes:       Bailey Mech Fleet 04/12/2016, 10:54 AM  Dani Gobble. Migdalia Dk PT, DPT Acute Rehabilitation  [phone]

## 2016-04-12 NOTE — Progress Notes (Signed)
Patient discharged to home with instructions. 

## 2016-04-13 ENCOUNTER — Encounter (HOSPITAL_COMMUNITY)
Admission: RE | Admit: 2016-04-13 | Discharge: 2016-04-13 | Disposition: A | Discharge status: Home / Self Care | Payer: BLUE CROSS/BLUE SHIELD | Source: Ambulatory Visit | Attending: Internal Medicine | Admitting: Internal Medicine

## 2016-04-13 ENCOUNTER — Telehealth (HOSPITAL_COMMUNITY): Payer: Self-pay | Admitting: *Deleted

## 2016-04-13 DIAGNOSIS — I272 Pulmonary hypertension, unspecified: Secondary | ICD-10-CM

## 2016-04-14 ENCOUNTER — Encounter (HOSPITAL_COMMUNITY): Payer: BLUE CROSS/BLUE SHIELD

## 2016-04-15 ENCOUNTER — Encounter (HOSPITAL_COMMUNITY): Payer: BLUE CROSS/BLUE SHIELD

## 2016-04-16 ENCOUNTER — Encounter (HOSPITAL_COMMUNITY): Payer: BLUE CROSS/BLUE SHIELD

## 2016-04-20 ENCOUNTER — Encounter (HOSPITAL_COMMUNITY): Payer: BLUE CROSS/BLUE SHIELD

## 2016-04-21 ENCOUNTER — Ambulatory Visit (INDEPENDENT_AMBULATORY_CARE_PROVIDER_SITE_OTHER): Payer: BLUE CROSS/BLUE SHIELD | Admitting: Acute Care

## 2016-04-21 ENCOUNTER — Encounter: Payer: Self-pay | Admitting: Acute Care

## 2016-04-21 DIAGNOSIS — J471 Bronchiectasis with (acute) exacerbation: Secondary | ICD-10-CM

## 2016-04-21 NOTE — Progress Notes (Signed)
History of Present Illness Deborah [name] is a 66 y.o. 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 She is followed by Dr. Corrin Parker at [facility] pulmonology. She was seen as an inpatient 03/2016 with acute on chronic respiratory failure.She is here for hospital follow up, and wants to re-establish with the practice for local care when she cannot get to [facility]..  04/21/2016 Hospital Follow Up: Pt. Presents for follow up after hospitalization for acute on chronic respiratory failure in the setting of suspected bronchiectasis exacerbation.She was  Admitted 2/12-2/19  to the ICU and treated with empiric antibiotics, NIPPV, supplemental oxygen,systemic steroids, and aggressive pulmonary toiley to include IS and flutter valve. She was discharged on a prednisone taper to stop at her 5 mg maintenance dose, oxygen, instructions to continue her Cellcept, Dulera and 3% nebulized saline treatments. She was also instructed to continue her Tadalafil and Tyvasco.She was discharged with home PT, and is hoping to resume her pulmonary rehab as soon as she is able. She feels this will be within the next week or two.She states that she is much better. She is compliant with all of her medications.She is wearing her oxygen ar 4L with rest and 6 L with exertion.She denied fever, chest pain, orthopnea or hemoptysis. She states she has very few secretions and what she has is clear. She has very little cough. She feels she is finally recovering from her past 2 admissions.  She is currently using 12 puffs of Tyvaso 4 times per day. The patient continues CellCept. She is on 2500 mg daily. She believes that she is tolerating this well. She is currently on her prednisone at 5 mg daily.   Tests  AFB x 3>> 04/08/2016>> Negative  Sputum Culture/ Tracheal Aspirate  04/08/2016>> Consistent with normal respiratory flora RSV 04/05/2016>> Positive  Pulmonary Function Test (PFT)  Latest Ref Rng & Units 11/01/2013 04/24/2014 10/31/2014 03/12/2015 07/09/2015  FVC PRE L 1.27 1.31 1.22 1.23 1.32  FVC % PRE PRED % 40 41 39 39 42  FEV1 PRE L 0.88 0.94 0.78 0.85 0.96  FEV1 % PRE PRED % 35 38 31 34 39  FEV1/FVC PRE % 69 71.8 63.81 68.96 72.55  TLC PRE Liters 1.87 - - - -  TLC % PRE PRED % 36 - - - -  RV PRE Liters 0.59 - - - -  RV % PRE PRED % 29 - - - -  DLCO PRE ml/(min*mmHg) 4.6 5.08 5.02 5.13 5.35  DLCO % PRE PRED % _0 Pulmonary function test done demonstrates severe restriction with severe reduction in diffusion capacity. I did not repeat studies today.  Chest x-ray: There continues to be marked volume loss on the left with evidence of bronchiectasis in the left upper lobe. There is shift of the mediastinum. There are reticular opacities at the right base suggesting early bronchiectasis in that region  Swallow Studies 01/20/2016 ( [facility]) >>She had 2 swallowing evaluations 1 demonstrated aspiration the other did not. Both studies demonstrated esophageal dysmotility   Past medical hx Past Medical History:  Diagnosis Date  . Anemia   . C. difficile colitis   . CAD (coronary artery disease)   . Cataract   . CHF (congestive heart failure) (HCC)    right sided heart failure from pulmonary hypertension  . COPD (chronic obstructive pulmonary disease) ([place])   . Gallstones   . Hearing loss  right  . Hyperactive airway disease   . Hyperlipidemia   . Hypertension   . Lung cancer ([place]) carcinoid tumor  2004  . Osteopenia   . Pneumonia   . Raynaud's disease   . Restrictive lung disease   . Shingles   . Tubulovillous adenoma of rectum 2007     Past surgical hx, Family hx, Social hx all reviewed.  Current Outpatient Prescriptions on File Prior to Visit  Medication Sig  . albuterol (PROVENTIL HFA;VENTOLIN HFA) 108 (90 BASE) MCG/ACT inhaler Inhale 2 puffs into the lungs every 6 (six) hours as needed. For shortness of breath.  Marland Kitchen atorvastatin (LIPITOR) 10  MG tablet Take 10 mg by mouth daily.  . cholecalciferol (VITAMIN D) 1000 units tablet Take 2,000 Units by mouth 2 (two) times a week.  . denosumab (PROLIA) 60 MG/ML SOLN injection Inject 60 mg into the skin every 6 (six) months. Administer in upper arm, thigh, or abdomen  . DULoxetine (CYMBALTA) 60 MG capsule Take 60 mg by mouth every evening.   . furosemide (LASIX) 40 MG tablet Take 40 mg by mouth 3 (three) times a week. M-W-F  . gabapentin (NEURONTIN) 400 MG capsule Take 400 mg by mouth 4 (four) times daily.  Marland Kitchen guaiFENesin (MUCINEX) 600 MG 12 hr tablet Take 600 mg by mouth 2 (two) times daily.   Marland Kitchen lidocaine (LIDODERM) 5 % Place 1 patch onto the skin daily. Remove & Discard patch within 12 hours or as directed by MD  . mometasone-formoterol (DULERA) 100-5 MCG/ACT AERO Inhale 2 puffs into the lungs 2 (two) times daily.  . Multiple Vitamin (MULTIVITAMIN WITH MINERALS) TABS Take 1 tablet by mouth daily.  . mycophenolate (CELLCEPT) 500 MG tablet Take 1,000 mg by mouth 2 (two) times daily.   Marland Kitchen oxycodone (OXY-IR) 5 MG capsule Take 5 mg by mouth every 6 (six) hours as needed.  . OXYGEN Inhale into the lungs. Continuous, 4 L/min when exercising and 6 L/min when sitting  . pantoprazole (PROTONIX) 40 MG tablet Take 40 mg by mouth daily.   Vladimir Faster Glycol-Propyl Glycol 0.4-0.3 % SOLN Apply 1 drop to eye at bedtime as needed (dry).   . potassium chloride SA (K-DUR,KLOR-CON) 20 MEQ tablet Take 20 mEq by mouth 3 (three) times a week. M-W-F  . predniSONE (DELTASONE) 10 MG tablet Take two tabs daily for 5 days, then 1 tab daily for 5 days  . saccharomyces boulardii (FLORASTOR) 250 MG capsule Take 1 capsule (250 mg total) by mouth 2 (two) times daily.  . sodium chloride HYPERTONIC 3 % nebulizer solution Take 3 mLs by nebulization daily.  . tadalafil, PAH, (ADCIRCA) 20 MG tablet Take 40 mg by mouth at bedtime.  . Treprostinil (TYVASO) 0.6 MG/ML SOLN Inhale 18 mcg into the lungs 4 (four) times daily.  1.66m/2.9mL   (12breaths)  . vitamin B-12 (CYANOCOBALAMIN) 1000 MCG tablet Take 1,000 mcg by mouth 2 (two) times a week. Tues and Thurs   No current facility-administered medications on file prior to visit.      Allergies  Allergen Reactions  . Megace Es [Megestrol Acetate]     Severe leg cramps   . Megestrol Acetate     REACTION: rash  . Lodine [Etodolac] Rash    REACTION: rash    Review Of Systems:  Constitutional:   No  weight loss, night sweats,  Fevers, chills, fatigue, or  lassitude.  HEENT:   No headaches,  Difficulty swallowing,  Tooth/dental problems, or  Sore throat,  No sneezing, itching, ear ache, nasal congestion, post nasal drip,   CV:  No chest pain,  Orthopnea, PND, swelling in lower extremities, anasarca, dizziness, palpitations, syncope.   GI  No heartburn, indigestion, abdominal pain, nausea, vomiting, diarrhea, change in bowel habits, loss of appetite, bloody stools.   Resp: + shortness of breath with exertion less  at rest.  Very little excess mucus, no productive cough,  No non-productive cough,  No coughing up of blood.  No change in color of mucus.  Occasional wheezing.  No chest wall deformity  Skin: no rash or lesions.  GU: no dysuria, change in color of urine, no urgency or frequency.  No flank pain, no hematuria   MS:  No joint pain or swelling.  No decreased range of motion.  No back pain.  Psych:  No change in mood or affect. No depression or anxiety.  No memory loss.   Vital Signs BP 112/72 (BP Location: Left Arm, Cuff Size: Normal)   Pulse (!) 113   Ht 5' 5" (1.651 m)   Wt 118 lb (53.5 kg)   SpO2 97%   BMI 19.64 kg/m    Physical Exam:  General- No distress,  A&Ox3, very pleasant frail female wearing nasal oxygen ENT: No sinus tenderness, TM clear, pale nasal mucosa, no oral exudate,no post nasal drip, no LAN Cardiac: S1, S2, regular rate and rhythm, no murmur Chest: Few Exp.  Wheeze/ No rales/ dullness; no accessory  muscle use, no nasal flaring, no sternal retractions Abd.: Soft Non-tender, flat Ext: No clubbing cyanosis, edema Neuro:Debilitated at baseline Skin: No rashes, warm and dry, thin Psych: normal mood and behavior, very appropriate   Assessment/Plan  Bronchiectasis with acute exacerbation (HCC) Acute exacerbation requiring hospitalization RSV + Plan: Continue wearing your oxygen at 4L at rest and 6 L with exertion Continue Prednisone to 5 mg as per day Continue cellcept Continue Dulera Rinse mouth after use Continue nebulized 3% saline ( for bronchiectasis) Continue Tyvasco Nebs as you have been doing. Continue Adcirca  20 mg daily as you have been doing Flutter valve and mucous  clearance Strongly encourage ongoing rehabilitation when you are ready. Let us know if we need to resend referral. Follow up with Dr. Lamonte Sakai 3/28 as is scheduled Follow up with Dr. Corrin Parker in June as is scheduled Call us as soon as you have any respiratory symptoms so we can be proactive in your care. Please contact office for sooner follow up if symptoms do not improve or worsen or seek emergency care      Magdalen Spatz, NP 04/21/2016  5:33 PM

## 2016-04-21 NOTE — Patient Instructions (Addendum)
It is nice to meet you today. Continue wearing your oxygen at 4L at rest and 6 L with exertion Continue Prednisone to 5 mg as per day Continue cellcept Continue Dulera Rinse mouth after use Continue nebulized 3% saline ( for bronchiectasis) Continue Tyvasco Nebs as you have been doing. Continue Adcirca  20 mg daily as you have been doing Flutter valve and mucous  clearance Strongly encourage ongoing rehabilitation when you are ready. Let us know if we need to resend referral. Follow up with Dr. Lamonte Sakai 3/28 as is scheduled Follow up with Dr. Corrin Parker in June as is scheduled Call us as soon as you have any respiratory symptoms so we can be proactive in your care. Please contact office for sooner follow up if symptoms do not improve or worsen or seek emergency care

## 2016-04-21 NOTE — Assessment & Plan Note (Signed)
Acute exacerbation requiring hospitalization RSV + Plan: Continue wearing your oxygen at 4L at rest and 6 L with exertion Continue Prednisone to 5 mg as per day Continue cellcept Continue Dulera Rinse mouth after use Continue nebulized 3% saline ( for bronchiectasis) Continue Tyvasco Nebs as you have been doing. Continue Adcirca  20 mg daily as you have been doing Flutter valve and mucous  clearance Strongly encourage ongoing rehabilitation when you are ready. Let us know if we need to resend referral. Follow up with Dr. Lamonte Sakai 3/28 as is scheduled Follow up with Dr. Corrin Parker in June as is scheduled Call us as soon as you have any respiratory symptoms so we can be proactive in your care. Please contact office for sooner follow up if symptoms do not improve or worsen or seek emergency care

## 2016-04-22 ENCOUNTER — Encounter (HOSPITAL_COMMUNITY): Payer: BLUE CROSS/BLUE SHIELD

## 2016-04-27 ENCOUNTER — Telehealth (HOSPITAL_COMMUNITY): Payer: Self-pay | Admitting: *Deleted

## 2016-04-27 ENCOUNTER — Encounter (HOSPITAL_COMMUNITY): Payer: BLUE CROSS/BLUE SHIELD

## 2016-04-29 ENCOUNTER — Encounter (HOSPITAL_COMMUNITY): Payer: BLUE CROSS/BLUE SHIELD

## 2016-04-29 ENCOUNTER — Encounter (HOSPITAL_COMMUNITY)
Admission: RE | Admit: 2016-04-29 | Discharge: 2016-04-29 | Disposition: A | Discharge status: Home / Self Care | Payer: BLUE CROSS/BLUE SHIELD | Source: Ambulatory Visit | Attending: Internal Medicine | Admitting: Internal Medicine

## 2016-04-29 DIAGNOSIS — J9610 Chronic respiratory failure, unspecified whether with hypoxia or hypercapnia: Secondary | ICD-10-CM | POA: Insufficient documentation

## 2016-04-29 DIAGNOSIS — J479 Bronchiectasis, uncomplicated: Secondary | ICD-10-CM | POA: Insufficient documentation

## 2016-04-29 DIAGNOSIS — Z5189 Encounter for other specified aftercare: Secondary | ICD-10-CM | POA: Insufficient documentation

## 2016-05-03 ENCOUNTER — Encounter (HOSPITAL_COMMUNITY): Payer: Self-pay

## 2016-05-03 ENCOUNTER — Ambulatory Visit (HOSPITAL_COMMUNITY)
Admission: RE | Admit: 2016-05-03 | Discharge: 2016-05-03 | Disposition: A | Discharge status: Home / Self Care | Payer: BLUE CROSS/BLUE SHIELD | Source: Ambulatory Visit | Attending: Internal Medicine | Admitting: Internal Medicine

## 2016-05-03 DIAGNOSIS — M810 Age-related osteoporosis without current pathological fracture: Secondary | ICD-10-CM | POA: Diagnosis present

## 2016-05-03 MED ORDER — DENOSUMAB 60 MG/ML ~~LOC~~ SOLN
60.0000 mg | Freq: Once | SUBCUTANEOUS | Status: AC
  Administered 2016-05-03: 60 mg via SUBCUTANEOUS
  Filled 2016-05-03: qty 1

## 2016-05-03 NOTE — Discharge Instructions (Signed)
Denosumab injection °What is this medicine? °DENOSUMAB (den oh sue mab) slows bone breakdown. Prolia is used to treat osteoporosis in women after menopause and in men. Xgeva is used to treat a high calcium level due to cancer and to prevent bone fractures and other bone problems caused by multiple myeloma or cancer bone metastases. Xgeva is also used to treat giant cell tumor of the bone. °This medicine may be used for other purposes; ask your health care provider or pharmacist if you have questions. °COMMON BRAND NAME(S): Prolia, XGEVA °What should I tell my health care provider before I take this medicine? °They need to know if you have any of these conditions: °-dental disease °-having surgery or tooth extraction °-infection °-kidney disease °-low levels of calcium or Vitamin D in the blood °-malnutrition °-on hemodialysis °-skin conditions or sensitivity °-thyroid or parathyroid disease °-an unusual reaction to denosumab, other medicines, foods, dyes, or preservatives °-pregnant or trying to get pregnant °-breast-feeding °How should I use this medicine? °This medicine is for injection under the skin. It is given by a health care professional in a hospital or clinic setting. °If you are getting Prolia, a special MedGuide will be given to you by the pharmacist with each prescription and refill. Be sure to read this information carefully each time. °For Prolia, talk to your pediatrician regarding the use of this medicine in children. Special care may be needed. For Xgeva, talk to your pediatrician regarding the use of this medicine in children. While this drug may be prescribed for children as young as 13 years for selected conditions, precautions do apply. °Overdosage: If you think you have taken too much of this medicine contact a poison control center or emergency room at once. °NOTE: This medicine is only for you. Do not share this medicine with others. °What if I miss a dose? °It is important not to miss your  dose. Call your doctor or health care professional if you are unable to keep an appointment. °What may interact with this medicine? °Do not take this medicine with any of the following medications: °-other medicines containing denosumab °This medicine may also interact with the following medications: °-medicines that lower your chance of fighting infection °-steroid medicines like prednisone or cortisone °This list may not describe all possible interactions. Give your health care provider a list of all the medicines, herbs, non-prescription drugs, or dietary supplements you use. Also tell them if you smoke, drink alcohol, or use illegal drugs. Some items may interact with your medicine. °What should I watch for while using this medicine? °Visit your doctor or health care professional for regular checks on your progress. Your doctor or health care professional may order blood tests and other tests to see how you are doing. °Call your doctor or health care professional for advice if you get a fever, chills or sore throat, or other symptoms of a cold or flu. Do not treat yourself. This drug may decrease your body's ability to fight infection. Try to avoid being around people who are sick. °You should make sure you get enough calcium and vitamin D while you are taking this medicine, unless your doctor tells you not to. Discuss the foods you eat and the vitamins you take with your health care professional. °See your dentist regularly. Brush and floss your teeth as directed. Before you have any dental work done, tell your dentist you are receiving this medicine. °Do not become pregnant while taking this medicine or for 5 months after stopping   it. Talk with your doctor or health care professional about your birth control options while taking this medicine. Women should inform their doctor if they wish to become pregnant or think they might be pregnant. There is a potential for serious side effects to an unborn child. Talk  to your health care professional or pharmacist for more information. What side effects may I notice from receiving this medicine? Side effects that you should report to your doctor or health care professional as soon as possible: -allergic reactions like skin rash, itching or hives, swelling of the face, lips, or tongue -bone pain -breathing problems -dizziness -jaw pain, especially after dental work -redness, blistering, peeling of the skin -signs and symptoms of infection like fever or chills; cough; sore throat; pain or trouble passing urine -signs of low calcium like fast heartbeat, muscle cramps or muscle pain; pain, tingling, numbness in the hands or feet; seizures -unusual bleeding or bruising -unusually weak or tired Side effects that usually do not require medical attention (report to your doctor or health care professional if they continue or are bothersome): -constipation -diarrhea -headache -joint pain -loss of appetite -muscle pain -runny nose -tiredness -upset stomach This list may not describe all possible side effects. Call your doctor for medical advice about side effects. You may report side effects to FDA at 1-800-FDA-1088. Where should I keep my medicine? This medicine is only given in a clinic, doctor's office, or other health care setting and will not be stored at home. NOTE: This sheet is a summary. It may not cover all possible information. If you have questions about this medicine, talk to your doctor, pharmacist, or health care provider.  2018 [employer]/Gold Standard (2016-03-02 19:17:21)

## 2016-05-04 ENCOUNTER — Encounter (HOSPITAL_COMMUNITY)
Admission: RE | Admit: 2016-05-04 | Discharge: 2016-05-04 | Disposition: A | Discharge status: Home / Self Care | Payer: BLUE CROSS/BLUE SHIELD | Source: Ambulatory Visit | Attending: Internal Medicine | Admitting: Internal Medicine

## 2016-05-04 ENCOUNTER — Encounter (HOSPITAL_COMMUNITY): Payer: BLUE CROSS/BLUE SHIELD

## 2016-05-04 NOTE — Progress Notes (Signed)
Deborah [name] 66 y.o. female Nutrition Note Spoke with pt. Pt has not been in rehab due to illness. Pt wt is down 1.1 kg since last rehab session. Pt reports she has started adding protein powder to her cereal to help increase her protein intake. High Calorie, High Protein diet reinforced.  Nutrition Diagnosis ? Food-and nutrition-related knowledge deficit related to lack of exposure to information as related to diagnosis of pulmonary disease ? Increased energy expenditure related to increased energy requirements during recent hospitalization as evidenced by BMI < 21 and recent h/o wt loss. Nutrition Intervention ? Pt's individual nutrition plan and goals reviewed with pt. ? Pt to attend the Nutrition and Lung Disease class ? Continual client-centered nutrition education by RD, as part of interdisciplinary care. Goal(s) 1. The pt will consume high-energy, high-nutrient dense beverages (e.g. Adding whey protein powder to milk and/or using ultrafiltered milk to increase protein intake) to compensate for decreased oral intake of solid foods. - met 2. Identify food quantities necessary to prevent further wt loss/ promote wt gain at graduation from pulmonary rehab. - not met at this time; continue Monitor and Evaluate progress toward nutrition goal with team.   Derek Mound, M.Ed, RD, LDN, CDE 05/04/2016 2:08 PM

## 2016-05-04 NOTE — Progress Notes (Signed)
Pulmonary Individual Treatment Plan  Patient Details  Name: Deborah [name] MRN: [id number] Date of Birth: 08/25/1950 Referring Provider:   [name] Pulmonary Rehab Walk Test from 02/12/2016 in [place]  Referring Provider  Dr. Nelda Marseille      Initial Encounter Date:  Flowsheet Row Pulmonary Rehab Walk Test from 02/12/2016 in [place]  Date  02/13/16  Referring Provider  Dr. Nelda Marseille      Visit Diagnosis: Pulmonary hypertension  Patient's Home Medications on Admission:   Current Outpatient Prescriptions:  .  albuterol (PROVENTIL HFA;VENTOLIN HFA) 108 (90 BASE) MCG/ACT inhaler, Inhale 2 puffs into the lungs every 6 (six) hours as needed. For shortness of breath., Disp: 1 Inhaler, Rfl: 3 .  atorvastatin (LIPITOR) 10 MG tablet, Take 10 mg by mouth daily., Disp: , Rfl:  .  cholecalciferol (VITAMIN D) 1000 units tablet, Take 2,000 Units by mouth 2 (two) times a week., Disp: , Rfl:  .  denosumab (PROLIA) 60 MG/ML SOLN injection, Inject 60 mg into the skin every 6 (six) months. Administer in upper arm, thigh, or abdomen, Disp: , Rfl:  .  DULoxetine (CYMBALTA) 60 MG capsule, Take 60 mg by mouth every evening. , Disp: , Rfl:  .  furosemide (LASIX) 40 MG tablet, Take 40 mg by mouth 3 (three) times a week. M-W-F, Disp: , Rfl:  .  gabapentin (NEURONTIN) 400 MG capsule, Take 400 mg by mouth 4 (four) times daily., Disp: , Rfl:  .  guaiFENesin (MUCINEX) 600 MG 12 hr tablet, Take 600 mg by mouth 2 (two) times daily. , Disp: , Rfl:  .  lidocaine (LIDODERM) 5 %, Place 1 patch onto the skin daily. Remove & Discard patch within 12 hours or as directed by MD, Disp: , Rfl:  .  mometasone-formoterol (DULERA) 100-5 MCG/ACT AERO, Inhale 2 puffs into the lungs 2 (two) times daily., Disp: 1 Inhaler, Rfl: 3 .  Multiple Vitamin (MULTIVITAMIN WITH MINERALS) TABS, Take 1 tablet by mouth daily., Disp: , Rfl:  .  mycophenolate (CELLCEPT) 500 MG  tablet, Take 1,000 mg by mouth 2 (two) times daily. , Disp: , Rfl:  .  oxycodone (OXY-IR) 5 MG capsule, Take 5 mg by mouth every 6 (six) hours as needed., Disp: , Rfl:  .  OXYGEN, Inhale into the lungs. Continuous, 4 L/min when exercising and 6 L/min when sitting, Disp: , Rfl:  .  pantoprazole (PROTONIX) 40 MG tablet, Take 40 mg by mouth daily. , Disp: , Rfl:  .  Polyethyl Glycol-Propyl Glycol 0.4-0.3 % SOLN, Apply 1 drop to eye at bedtime as needed (dry). , Disp: , Rfl:  .  potassium chloride SA (K-DUR,KLOR-CON) 20 MEQ tablet, Take 20 mEq by mouth 3 (three) times a week. M-W-F, Disp: , Rfl:  .  predniSONE (DELTASONE) 10 MG tablet, Take two tabs daily for 5 days, then 1 tab daily for 5 days, Disp: 15 tablet, Rfl: 0 .  saccharomyces boulardii (FLORASTOR) 250 MG capsule, Take 1 capsule (250 mg total) by mouth 2 (two) times daily., Disp: 14 capsule, Rfl: 0 .  sodium chloride HYPERTONIC 3 % nebulizer solution, Take 3 mLs by nebulization daily., Disp: , Rfl:  .  tadalafil, PAH, (ADCIRCA) 20 MG tablet, Take 40 mg by mouth at bedtime., Disp: , Rfl:  .  Treprostinil (TYVASO) 0.6 MG/ML SOLN, Inhale 18 mcg into the lungs 4 (four) times daily. 1.50m/2.9mL   (12breaths), Disp: , Rfl:  .  vitamin B-12 (CYANOCOBALAMIN)  1000 MCG tablet, Take 1,000 mcg by mouth 2 (two) times a week. Tues and Thurs, Disp: , Rfl:   Past Medical History: Past Medical History:  Diagnosis Date  . Anemia   . C. difficile colitis   . CAD (coronary artery disease)   . Cataract   . CHF (congestive heart failure) (HCC)    right sided heart failure from pulmonary hypertension  . COPD (chronic obstructive pulmonary disease) ([place])   . Gallstones   . Hearing loss    right  . Hyperactive airway disease   . Hyperlipidemia   . Hypertension   . Lung cancer ([place]) carcinoid tumor  2004  . Osteopenia   . Pneumonia   . Raynaud's disease   . Restrictive lung disease   . Shingles   . Tubulovillous adenoma of rectum 2007    Tobacco  Use: History  Smoking Status  . Never Smoker  Smokeless Tobacco  . Never Used    Labs: Recent Review Flowsheet Data    Labs for ITP Cardiac and Pulmonary Rehab Latest Ref Rng & Units 03/01/2011 04/05/2016 04/05/2016 04/05/2016 04/06/2016   Cholestrol 0 - 200 mg/dL 211(H) - - - -   LDLDIRECT mg/dL 143.1 - - - -   HDL >39.00 mg/dL 44.00 - - - -   Trlycerides 0.0 - 149.0 mg/dL 105.0 - - - -   Hemoglobin A1c 4.8 - 5.6 % - - - - 5.5   HCO3 20.0 - 28.0 mmol/L - 31.9(H) - 33.6(H) -   TCO2 0 - 100 mmol/L - 34 30 35 -   O2SAT % - 50.0 - 98.0 -      Capillary Blood Glucose: Lab Results  Component Value Date   GLUCAP 202 (H) 04/06/2016   GLUCAP 157 (H) 04/06/2016   GLUCAP 152 (H) 04/06/2016   GLUCAP 147 (H) 04/05/2016     ADL UCSD:     Pulmonary Assessment Scores    Row Name 02/25/16 1121         ADL UCSD   ADL Phase Entry     SOB Score total 67        Pulmonary Function Assessment:     Pulmonary Function Assessment - 02/06/16 1502      Breath   Bilateral Breath Sounds Other   Other fine crackles bases bilat right greater than left   Shortness of Breath Fear of Shortness of Breath;Limiting activity      Exercise Target Goals:    Exercise Program Goal: Individual exercise prescription set with THRR, safety & activity barriers. Participant demonstrates ability to understand and report RPE using BORG scale, to self-measure pulse accurately, and to acknowledge the importance of the exercise prescription.  Exercise Prescription Goal: Starting with aerobic activity 30 plus minutes a day, 3 days per week for initial exercise prescription. Provide home exercise prescription and guidelines that participant acknowledges understanding prior to discharge.  Activity Barriers & Risk Stratification:     Activity Barriers & Cardiac Risk Stratification - 02/06/16 1500      Activity Barriers & Cardiac Risk Stratification   Activity Barriers Deconditioning;Muscular  Weakness;Shortness of Breath      6 Minute Walk:     6 Minute Walk    Row Name 02/13/16 0744 02/26/16 1605       6 Minute Walk   Phase Initial Mid Program  for doctors office concerning o2 needs    Distance 552 feet 675 feet    Walk Time -  5 minutes and  45 seconds 6 minutes    # of Rest Breaks 1  15 seconds 0    MPH 1.04 1.27    METS 1.77 1.92    RPE 14 16    Perceived Dyspnea  4 3    Symptoms No No    Resting HR 124 bpm 124 bpm    Resting BP 140/70 150/66    Max Ex. HR 142 bpm 139 bpm    Max Ex. BP 160/60 210/80    2 Minute Post BP 124/64 150/50      Interval HR   Baseline HR 124 124    1 Minute HR 124 128    2 Minute HR 128 130    3 Minute HR 132 132    4 Minute HR 135 139    5 Minute HR 139 139    6 Minute HR 142 136    2 Minute Post HR 122 128    Interval Heart Rate? Yes Yes      Interval Oxygen   Interval Oxygen? Yes Yes    Baseline Oxygen Saturation % 94 % 94 %    Baseline Liters of Oxygen 4 L 4 L    1 Minute Oxygen Saturation % 92 % 92 %    1 Minute Liters of Oxygen 4 L 4 L    2 Minute Oxygen Saturation % 89 % 90 %    2 Minute Liters of Oxygen 4 L 4 L    3 Minute Oxygen Saturation % 89 % 89 %    3 Minute Liters of Oxygen 4 L 4 L    4 Minute Oxygen Saturation % 88 % 88 %    4 Minute Liters of Oxygen 4 L 4 L    5 Minute Oxygen Saturation % 87 % 90 %    5 Minute Liters of Oxygen 4 L 6 L    6 Minute Oxygen Saturation % 89 % 91 %    6 Minute Liters of Oxygen 4 L 6 L    2 Minute Post Oxygen Saturation % 100 % 94 %    2 Minute Post Liters of Oxygen 4 L 6 L       Oxygen Initial Assessment:   Oxygen Re-Evaluation:   Oxygen Discharge (Final Oxygen Re-Evaluation):   Initial Exercise Prescription:     Initial Exercise Prescription - 02/13/16 0700      Date of Initial Exercise RX and Referring Provider   Date 02/13/16   Referring Provider Dr. Nelda Marseille     Oxygen   Oxygen Continuous   Liters 4     Bike   Level 0.3   Minutes 17     NuStep    Level 1   Minutes 17   METs 1.5     Track   Laps 5   Minutes 17     Prescription Details   Frequency (times per week) 2   Duration Progress to 45 minutes of aerobic exercise without signs/symptoms of physical distress     Intensity   THRR 40-80% of Max Heartrate 63-126   Ratings of Perceived Exertion 11-13   Perceived Dyspnea 0-4     Progression   Progression Continue progressive overload as per policy without signs/symptoms or physical distress.     Resistance Training   Training Prescription Yes   Weight orange bands   Reps 10-12      Perform Capillary Blood Glucose checks as needed.  Exercise Prescription Changes:  Exercise Prescription Changes    Row Name 02/19/16 1500 02/24/16 1500 02/26/16 1600 03/02/16 1500 03/04/16 1600     Response to Exercise   Blood Pressure (Admit) 140/60 102/60 150/66 92/50 110/40   Blood Pressure (Exercise) 132/60 160/80 210/80  she will add her lasix back in her medicines,  126/60  -   Blood Pressure (Exit) 142/70 126/62 150/50 106/60 120/64   Heart Rate (Admit) 121 bpm 120 bpm 124 bpm 119 bpm 118 bpm   Heart Rate (Exercise) 122 bpm 130 bpm 139 bpm 132 bpm 122 bpm   Heart Rate (Exit) 116 bpm 115 bpm 128 bpm 114 bpm 111 bpm   Oxygen Saturation (Admit) 92 % 99 % 94 % 93 % 90 %   Oxygen Saturation (Exercise) 9592 % 89 % 88 % 89 % 96 %   Oxygen Saturation (Exit) 96 % 96 % 94 % 97 % 100 %   Rating of Perceived Exertion (Exercise)  - _0 Perceived Dyspnea (Exercise)  - _1 Duration Progress to 45 minutes of aerobic exercise without signs/symptoms of physical distress Progress to 45 minutes of aerobic exercise without signs/symptoms of physical distress Progress to 45 minutes of aerobic exercise without signs/symptoms of physical distress Progress to 45 minutes of aerobic exercise without signs/symptoms of physical distress Progress to 45 minutes of aerobic exercise without signs/symptoms of physical distress   Intensity  -  40-80% HRR THRR unchanged THRR unchanged THRR unchanged THRR unchanged     Progression   Progression Continue to progress workloads to maintain intensity without signs/symptoms of physical distress. Continue to progress workloads to maintain intensity without signs/symptoms of physical distress. Continue to progress workloads to maintain intensity without signs/symptoms of physical distress. Continue to progress workloads to maintain intensity without signs/symptoms of physical distress. Continue to progress workloads to maintain intensity without signs/symptoms of physical distress.     Resistance Training   Training Prescription _2    Weight _3    Reps 10-12  10 minutes of strength training 10-12  10 minutes of strength training 10-12  10 minutes of strength training 10-12  10 minutes of strength training 10-12  10 minutes of strength training     Interval Training   Interval Training _4      Oxygen   Oxygen  - Continuous Continuous Continuous Continuous   Liters  - _5 Recumbant Bike   Level 1 1  - 1 1   Minutes 17 17  - 17 17     NuStep   Level 1 1  - 1  -   Minutes 17 17  - 17  -   METs  - 1.4  -  -  -     Track   Laps  - 5 5  performed another 6 minute walk test during class 8 7   Minutes  - _6 Row Name 03/09/16 1515 03/16/16 1500 03/18/16 1600 03/23/16 1514 03/30/16 1500     Response to Exercise   Blood Pressure (Admit) 100/40 100/50 98/60 104/50 114/70   Blood Pressure (Exercise) 100/40 110/50 134/62 120/60 120/56   Blood Pressure (Exit) 126/72 122/70 110/62 112/62 120/60   Heart Rate (Admit) 119 bpm 116 bpm 115 bpm 120 bpm 120 bpm   Heart Rate (Exercise) 130 bpm 124 bpm  116 bpm 126 bpm 129 bpm   Heart Rate (Exit) 109 bpm 110 bpm 100 bpm 114 bpm 114 bpm   Oxygen Saturation (Admit) 95 % 99 % 95 % 94 % 99 %   Oxygen Saturation (Exercise) 91 % 93 % 90 % 94 %  91 %   Oxygen Saturation (Exit) 98 % 99 % 93 % 100 % 100 %   Rating of Perceived Exertion (Exercise) _0 Perceived Dyspnea (Exercise) _1 Duration Progress to 45 minutes of aerobic exercise without signs/symptoms of physical distress Progress to 45 minutes of aerobic exercise without signs/symptoms of physical distress Progress to 45 minutes of aerobic exercise without signs/symptoms of physical distress Progress to 45 minutes of aerobic exercise without signs/symptoms of physical distress Progress to 45 minutes of aerobic exercise without signs/symptoms of physical distress   Intensity _2      Progression   Progression Continue to progress workloads to maintain intensity without signs/symptoms of physical distress. Continue to progress workloads to maintain intensity without signs/symptoms of physical distress. Continue to progress workloads to maintain intensity without signs/symptoms of physical distress. Continue to progress workloads to maintain intensity without signs/symptoms of physical distress. Continue to progress workloads to maintain intensity without signs/symptoms of physical distress.     Resistance Training   Training Prescription _3    Weight _4    Reps 10-12  10 minutes of strength training 10-12  10 minutes of strength training 10-12  10 minutes of strength training 10-12  10 minutes of strength training 10-12  10 minutes of strength training     Interval Training   Interval Training _5      Oxygen   Oxygen _6    Liters _7 Recumbant Bike   Level _8 Minutes _9 NuStep   Level _10 Minutes _11 METs  -  -  - 1.6 1.6     Track   Laps 7 8  - 5 4   Minutes 17 17  - 17 17      Exercise Review   Progression  -  - Yes  - Yes      Exercise Comments:     Exercise Comments    Row Name 03/16/16 0752 04/05/16 1612         Exercise Comments Patient has only attended 6 exercise sessions. She is up to 8 laps on the track and her MET level places her at a low level. Will cont. to motivate and encourage patient to increase her intensities.  Patient has been hospitalized. Will cont. to monitor and progress when patient returns.          Exercise Goals and Review:   Exercise Goals Re-Evaluation :     Exercise Goals Re-Evaluation    Row Name 05/03/16 1154             Exercise Goal Re-Evaluation   Exercise Goals Review Increase Physical Activity;Increase Strenth and Stamina       Comments Patient was hospitalized and has been out of rehab since 03/30/16. Patient will be back in rehab this week. Will cont.  to monitor and progress.       Expected Outcomes Patient will cont. to increase endurance and stamina through the exercises here at rehab. She will also practice her breathing techinques to become less short of breath on exertion.           Discharge Exercise Prescription (Final Exercise Prescription Changes):     Exercise Prescription Changes - 03/30/16 1500      Response to Exercise   Blood Pressure (Admit) 114/70   Blood Pressure (Exercise) 120/56   Blood Pressure (Exit) 120/60   Heart Rate (Admit) 120 bpm   Heart Rate (Exercise) 129 bpm   Heart Rate (Exit) 114 bpm   Oxygen Saturation (Admit) 99 %   Oxygen Saturation (Exercise) 91 %   Oxygen Saturation (Exit) 100 %   Rating of Perceived Exertion (Exercise) 12   Perceived Dyspnea (Exercise) 1   Duration Progress to 45 minutes of aerobic exercise without signs/symptoms of physical distress   Intensity THRR unchanged     Progression   Progression Continue to progress workloads to maintain intensity without signs/symptoms of physical distress.     Resistance Training   Training Prescription Yes    Weight orange bands   Reps 10-12  10 minutes of strength training     Interval Training   Interval Training No     Oxygen   Oxygen Continuous   Liters 6     Recumbant Bike   Level 2   Minutes 17     NuStep   Level 2   Minutes 17   METs 1.6     Track   Laps 4   Minutes 17     Exercise Review   Progression Yes      Nutrition:  Target Goals: Understanding of nutrition guidelines, daily intake of sodium <1547m, cholesterol <2066m calories 30% from fat and 7% or less from saturated fats, daily to have 5 or more servings of fruits and vegetables.  Biometrics:     Pre Biometrics - 02/19/16 1554      Pre Biometrics   Grip Strength 22 kg       Nutrition Therapy Plan and Nutrition Goals:     Nutrition Therapy & Goals - 03/30/16 1447      Nutrition Therapy   Diet High Calorie, High Protein     Personal Nutrition Goals   Nutrition Goal Prevent further wt loss/promote wt gain     Intervention Plan   Intervention Prescribe, educate and counsel regarding individualized specific dietary modifications aiming towards targeted core components such as weight, hypertension, lipid management, diabetes, heart failure and other comorbidities.   Expected Outcomes Short Term Goal: Understand basic principles of dietary content, such as calories, fat, sodium, cholesterol and nutrients.;Long Term Goal: Adherence to prescribed nutrition plan.      Nutrition Discharge: Rate Your Plate Scores:     Nutrition Assessments - 03/30/16 1447      Rate Your Plate Scores   Pre Score 53      Nutrition Goals Re-Evaluation:   Nutrition Goals Discharge (Final Nutrition Goals Re-Evaluation):   Psychosocial: Target Goals: Acknowledge presence or absence of significant depression and/or stress, maximize coping skills, provide positive support system. Participant is able to verbalize types and ability to use techniques and skills needed for reducing stress and depression.  Initial  Review & Psychosocial Screening:     Initial Psych Review & Screening - 02/06/16 [place]  Yes     Barriers   Psychosocial barriers to participate in program The patient should benefit from training in stress management and relaxation.     Screening Interventions   Interventions Encouraged to exercise      Quality of Life Scores:     Quality of Life - 02/25/16 1121      Quality of Life Scores   Health/Function Pre 9.17 %   Socioeconomic Pre 20.93 %   Psych/Spiritual Pre 14.57 %   Family Pre 10.5 %   GLOBAL Pre 13.05 %      PHQ-9: Recent Review Flowsheet Data    Depression screen [facility] 2/9 02/06/2016 07/09/2014 01/21/2014   Decreased Interest 1 1 0   Down, Depressed, Hopeless 0 0 0   PHQ - 2 Score 1 1 0     Interpretation of Total Score  Total Score Depression Severity:  1-4 = Minimal depression, 5-9 = Mild depression, 10-14 = Moderate depression, 15-19 = Moderately severe depression, 20-27 = Severe depression   Psychosocial Evaluation and Intervention:     Psychosocial Evaluation - 02/06/16 1504      Psychosocial Evaluation & Interventions   Interventions Encouraged to exercise with the program and follow exercise prescription      Psychosocial Re-Evaluation:     Psychosocial Re-Evaluation    [place] Name 02/19/16 1633 03/15/16 1458 04/06/16 0907 04/29/16 0836       Psychosocial Re-Evaluation   Current issues with  -  -  - None Identified    Comments no psychosocial issue identified no psychosocial issues identified good support system, no psychosocial issues identified at this time  -    Interventions Encouraged to attend Pulmonary Rehabilitation for the exercise Encouraged to attend Pulmonary Rehabilitation for the exercise Encouraged to attend Pulmonary Rehabilitation for the exercise Encouraged to attend Pulmonary Rehabilitation for the exercise    Continue Psychosocial Services  No No No No Follow up required        Psychosocial Discharge (Final Psychosocial Re-Evaluation):     Psychosocial Re-Evaluation - 04/29/16 0836      Psychosocial Re-Evaluation   Current issues with None Identified   Interventions Encouraged to attend Pulmonary Rehabilitation for the exercise   Continue Psychosocial Services  No Follow up required      Education: Education Goals: Education classes will be provided on a weekly basis, covering required topics. Participant will state understanding/return demonstration of topics presented.  Learning Barriers/Preferences:     Learning Barriers/Preferences - 02/06/16 1500      Learning Barriers/Preferences   Learning Barriers None   Learning Preferences Computer/Internet;Group Instruction;Verbal Instruction;Written Material      Education Topics: Risk Factor Reduction:  -Group instruction that is supported by a PowerPoint presentation. Instructor discusses the definition of a risk factor, different risk factors for pulmonary disease, and how the heart and lungs work together.     Nutrition for Pulmonary Patient:  -Group instruction provided by PowerPoint slides, verbal discussion, and written materials to support subject matter. The instructor gives an explanation and review of healthy diet recommendations, which includes a discussion on weight management, recommendations for fruit and vegetable consumption, as well as protein, fluid, caffeine, fiber, sodium, sugar, and alcohol. Tips for eating when patients are short of breath are discussed. Flowsheet Row PULMONARY REHAB OTHER RESPIRATORY from 03/18/2016 in [place]  Date  03/04/16  Educator  [place]  Instruction Review Code  2- meets goals/outcomes      Pursed Lip Breathing:  -  Group instruction that is supported by demonstration and informational handouts. Instructor discusses the benefits of pursed lip and diaphragmatic breathing and detailed demonstration on how to preform both.      Oxygen Safety:  -Group instruction provided by PowerPoint, verbal discussion, and written material to support subject matter. There is an overview of "What is Oxygen" and "Why do we need it".  Instructor also reviews how to create a safe environment for oxygen use, the importance of using oxygen as prescribed, and the risks of noncompliance. There is a brief discussion on traveling with oxygen and resources the patient may utilize.   Oxygen Equipment:  -Group instruction provided by [facility] Staff utilizing handouts, written materials, and equipment demonstrations. Flowsheet Row PULMONARY REHAB OTHER RESPIRATORY from 03/18/2016 in [place]  Date  02/26/16  Educator  [employer]  Instruction Review Code  2- meets goals/outcomes      Signs and Symptoms:  -Group instruction provided by written material and verbal discussion to support subject matter. Warning signs and symptoms of infection, stroke, and heart attack are reviewed and when to call the physician/911 reinforced. Tips for preventing the spread of infection discussed.   Advanced Directives:  -Group instruction provided by verbal instruction and written material to support subject matter. Instructor reviews Advanced Directive laws and proper instruction for filling out document.   Pulmonary Video:  -Group video education that reviews the importance of medication and oxygen compliance, exercise, good nutrition, pulmonary hygiene, and pursed lip and diaphragmatic breathing for the pulmonary patient. Flowsheet Row PULMONARY REHAB OTHER RESPIRATORY from 03/18/2016 in [place]  Date  02/19/16  Educator  video  Instruction Review Code  2- meets goals/outcomes      Exercise for the Pulmonary Patient:  -Group instruction that is supported by a PowerPoint presentation. Instructor discusses benefits of exercise, core components of exercise, frequency, duration, and  intensity of an exercise routine, importance of utilizing pulse oximetry during exercise, safety while exercising, and options of places to exercise outside of rehab.     Pulmonary Medications:  -Verbally interactive group education provided by instructor with focus on inhaled medications and proper administration. Flowsheet Row PULMONARY REHAB OTHER RESPIRATORY from 03/18/2016 in [place]  Date  03/18/16  Educator  [facility]  Instruction Review Code  2- meets goals/outcomes      Anatomy and Physiology of the Respiratory System and Intimacy:  -Group instruction provided by PowerPoint, verbal discussion, and written material to support subject matter. Instructor reviews respiratory cycle and anatomical components of the respiratory system and their functions. Instructor also reviews differences in obstructive and restrictive respiratory diseases with examples of each. Intimacy, Sex, and Sexuality differences are reviewed with a discussion on how relationships can change when diagnosed with pulmonary disease. Common sexual concerns are reviewed.   Knowledge Questionnaire Score:     Knowledge Questionnaire Score - 02/25/16 1120      Knowledge Questionnaire Score   Pre Score 13/13      Core Components/Risk Factors/Patient Goals at Admission:     Personal Goals and Risk Factors at Admission - 02/19/16 1631      Core Components/Risk Factors/Patient Goals on Admission    Weight Management Weight Maintenance      Core Components/Risk Factors/Patient Goals Review:      Goals and Risk Factor Review    Row Name 02/19/16 1632 03/15/16 1456 04/06/16 0906 04/29/16 0834       Core Components/Risk  Factors/Patient Goals Review   Personal Goals Review Increase Strength and Stamina;Improve shortness of breath with ADL's;Weight Management/Obesity Increase Strength and Stamina;Improve shortness of breath with ADL's;Weight Management/Obesity  weight gain, not  weight loss Increase Strength and Stamina;Improve shortness of breath with ADL's;Weight Management/Obesity Improve shortness of breath with ADL's;Weight Management/Obesity    Review Just started program today, work on weight management, too early to see any progression. 6 sessions completed, looks much better than at start of program, adjusting well Presently hospitalized for respiratory failure and atelectasis Released from hospital 2 weeks ago, is working with home PT, in hopes of returning yo pulmonary rehab next week    Expected Outcomes  - continue to progress with workload increases Continue to work on goals when she is able to return to program continue to work on goals       Core Components/Risk Factors/Patient Goals at Discharge (Final Review):      Goals and Risk Factor Review - 04/29/16 0834      Core Components/Risk Factors/Patient Goals Review   Personal Goals Review Improve shortness of breath with ADL's;Weight Management/Obesity   Review Released from hospital 2 weeks ago, is working with home PT, in hopes of returning yo pulmonary rehab next week   Expected Outcomes continue to work on goals      ITP Comments:   Comments:

## 2016-05-04 NOTE — Progress Notes (Signed)
Daily Session Note  Patient Details  Name: Deborah [name] MRN: [id number] Date of Birth: 1950-05-01 Referring Provider:   [name] Pulmonary Rehab Walk Test from 02/12/2016 in [place]  Referring Provider  Dr. Nelda Marseille      Encounter Date: 05/04/2016  Check In:     Session Check In - 05/04/16 1404      Check-In   Location [facility]   Staff Present Su Hilt, MS, ACSM RCEP, Exercise Physiologist;Portia Rollene Rotunda, RN, BSN   Supervising physician immediately available to respond to emergencies [facility] immediately available   Physician(s) Dr. Maylene Roes   Medication changes reported     No   Fall or balance concerns reported    No   Tobacco Cessation No Change   Warm-up and Cool-down Performed as group-led instruction   Resistance Training Performed Yes   VAD Patient? No     Pain Assessment   Currently in Pain? No/denies   Multiple Pain Sites No      Capillary Blood Glucose: No results found for this or any previous visit (from the past 24 hour(s)).      Exercise Prescription Changes - 05/04/16 1500      Response to Exercise   Blood Pressure (Admit) 138/50   Blood Pressure (Exercise) 138/64   Blood Pressure (Exit) 96/62   Heart Rate (Admit) 126 bpm   Heart Rate (Exercise) 127 bpm   Heart Rate (Exit) 119 bpm   Oxygen Saturation (Admit) 98 %   Oxygen Saturation (Exercise) 97 %   Oxygen Saturation (Exit) 100 %   Rating of Perceived Exertion (Exercise) 13   Perceived Dyspnea (Exercise) 2   Duration Progress to 45 minutes of aerobic exercise without signs/symptoms of physical distress   Intensity THRR unchanged     Progression   Progression Continue to progress workloads to maintain intensity without signs/symptoms of physical distress.     Resistance Training   Training Prescription Yes   Weight orange bands   Reps 10-15   Time 10 Minutes     Interval Training   Interval Training No     Oxygen   Oxygen Continuous   Liters 6     Recumbant Bike   Level 2   Minutes 17     NuStep   Level 2   Minutes 17   METs 1.4     Track   Laps 5   Minutes 17      History  Smoking Status  . Never Smoker  Smokeless Tobacco  . Never Used    Goals Met:  Exercise tolerated well No report of cardiac concerns or symptoms Strength training completed today  Goals Unmet:  Not Applicable  Comments: Service time is from 1330 to 1515    Dr. Rush Farmer is Medical Director for Pulmonary Rehab at [facility].

## 2016-05-05 ENCOUNTER — Telehealth: Payer: Self-pay | Admitting: Internal Medicine

## 2016-05-05 NOTE — Telephone Encounter (Signed)
Patient is rescheduled to tomorrow at 2:00 with Alonza Bogus, PA

## 2016-05-06 ENCOUNTER — Other Ambulatory Visit: Payer: BLUE CROSS/BLUE SHIELD

## 2016-05-06 ENCOUNTER — Encounter (HOSPITAL_COMMUNITY): Payer: BLUE CROSS/BLUE SHIELD

## 2016-05-06 ENCOUNTER — Ambulatory Visit (INDEPENDENT_AMBULATORY_CARE_PROVIDER_SITE_OTHER): Payer: BLUE CROSS/BLUE SHIELD | Admitting: Gastroenterology

## 2016-05-06 ENCOUNTER — Encounter: Payer: Self-pay | Admitting: Gastroenterology

## 2016-05-06 VITALS — BP 98/50 | HR 80 | Ht 65.0 in | Wt 120.0 lb

## 2016-05-06 DIAGNOSIS — R197 Diarrhea, unspecified: Secondary | ICD-10-CM

## 2016-05-06 MED ORDER — HYOSCYAMINE SULFATE 0.125 MG SL SUBL: 0.1250 mg | SUBLINGUAL_TABLET | Freq: Four times a day (QID) | SUBLINGUAL | 0 refills | Status: DC | PRN

## 2016-05-06 NOTE — Patient Instructions (Signed)
Your physician has requested that you go to the basement for lab work before leaving today.  We have sent the following medications to your pharmacy for you to pick up at your convenience: levsin 0.125 mg every 6 hrs as needed

## 2016-05-06 NOTE — Progress Notes (Signed)
05/06/2016 MAGEN SURIANO [id number] Apr 03, 1950   HISTORY OF PRESENT ILLNESS:  This is a pleasant 66 year old female with progressive restrictive lung disease with PAH and CREST syndrome who follows with Pulmonary at [facility].  On continuous O2 and immunosuppressive therapy.  She's had issues with diarrhea due to her CellCept for the last couple of years on and off. Recently she had several upper respiratory issues for which she has been treated with courses of antibiotics between November and February. February 15 she started having diarrhea. Stool sample performed by her PCP was negative for C. difficile on February 28. The diarrhea then resolved until March 6 when she began to have abdominal cramping with recurrent diarrhea. They discontinued her CellCept on March 9, however, she did not have resolution of her symptoms while holding that. She has been continuing with diarrhea. She says that she had 8 bowel movements already this morning. She's been taking generic Pepto-Bismol tablets, which do seem to help at least with the cramping. Her PCP ordered oral vancomycin 125 mg 4 times daily but she has not yet started.   Past Medical History:  Diagnosis Date  . Anemia   . C. difficile colitis   . CAD (coronary artery disease)   . Cataract   . CHF (congestive heart failure) (HCC)    right sided heart failure from pulmonary hypertension  . COPD (chronic obstructive pulmonary disease) ([place])   . Gallstones   . Hearing loss    right  . Hyperactive airway disease   . Hyperlipidemia   . Hypertension   . Lung cancer ([place]) carcinoid tumor  2004  . Osteopenia   . Pneumonia   . Raynaud's disease   . Restrictive lung disease   . Shingles   . Tubulovillous adenoma of rectum 2007   Past Surgical History:  Procedure Laterality Date  . broncho pleural fistula  2004  . COLONOSCOPY    . pulmonary carcinoid  2004  . thoracotomies  2004  . VIDEO BRONCHOSCOPY  12/07/2011   Procedure: VIDEO  BRONCHOSCOPY WITHOUT FLUORO;  Surgeon: Kathee Delton, MD;  Location: [facility];  Service: Cardiopulmonary;  Laterality: Bilateral;    reports that she has never smoked. She has never used smokeless tobacco. She reports that she does not drink alcohol or use drugs. family history is not on file. She was adopted. Allergies  Allergen Reactions  . Megace Es [Megestrol Acetate]     Severe leg cramps   . Megestrol Acetate     REACTION: rash  . Lodine [Etodolac] Rash    REACTION: rash      Outpatient Encounter Prescriptions as of 05/06/2016  Medication Sig  . albuterol (PROVENTIL HFA;VENTOLIN HFA) 108 (90 BASE) MCG/ACT inhaler Inhale 2 puffs into the lungs every 6 (six) hours as needed. For shortness of breath.  Marland Kitchen atorvastatin (LIPITOR) 10 MG tablet Take 10 mg by mouth daily.  . cholecalciferol (VITAMIN D) 1000 units tablet Take 2,000 Units by mouth 2 (two) times a week.  . denosumab (PROLIA) 60 MG/ML SOLN injection Inject 60 mg into the skin every 6 (six) months. Administer in upper arm, thigh, or abdomen  . DULoxetine (CYMBALTA) 60 MG capsule Take 60 mg by mouth every evening.   . furosemide (LASIX) 40 MG tablet Take 40 mg by mouth 3 (three) times a week. M-W-F  . gabapentin (NEURONTIN) 400 MG capsule Take 400 mg by mouth 4 (four) times daily.  Marland Kitchen lidocaine (LIDODERM) 5 % Place  1 patch onto the skin daily. Remove & Discard patch within 12 hours or as directed by MD  . mometasone-formoterol (DULERA) 100-5 MCG/ACT AERO Inhale 2 puffs into the lungs 2 (two) times daily.  . Multiple Vitamin (MULTIVITAMIN WITH MINERALS) TABS Take 1 tablet by mouth daily.  . mycophenolate (CELLCEPT) 500 MG tablet Take 1,000 mg by mouth 2 (two) times daily.   Marland Kitchen oxycodone (OXY-IR) 5 MG capsule Take 5 mg by mouth every 6 (six) hours as needed.  . OXYGEN Inhale into the lungs. Continuous, 4 L/min when exercising and 6 L/min when sitting  . pantoprazole (PROTONIX) 40 MG tablet Take 40 mg by mouth daily.   Vladimir Faster Glycol-Propyl Glycol 0.4-0.3 % SOLN Apply 1 drop to eye at bedtime as needed (dry).   . potassium chloride SA (K-DUR,KLOR-CON) 20 MEQ tablet Take 20 mEq by mouth 3 (three) times a week. M-W-F  . predniSONE (DELTASONE) 10 MG tablet Take two tabs daily for 5 days, then 1 tab daily for 5 days (Patient taking differently: Take 5 mg by mouth daily. Take two tabs daily for 5 days, then 1 tab daily for 5 days)  . sodium chloride HYPERTONIC 3 % nebulizer solution Take 3 mLs by nebulization daily.  . tadalafil, PAH, (ADCIRCA) 20 MG tablet Take 40 mg by mouth at bedtime.  . Treprostinil (TYVASO) 0.6 MG/ML SOLN Inhale 18 mcg into the lungs 4 (four) times daily. 1.58m/2.9mL   (12breaths)  . vitamin B-12 (CYANOCOBALAMIN) 1000 MCG tablet Take 1,000 mcg by mouth 2 (two) times a week. Tues and Thurs  . [DISCONTINUED] guaiFENesin (MUCINEX) 600 MG 12 hr tablet Take 600 mg by mouth 2 (two) times daily.   . [DISCONTINUED] saccharomyces boulardii (FLORASTOR) 250 MG capsule Take 1 capsule (250 mg total) by mouth 2 (two) times daily.   No facility-administered encounter medications on file as of 05/06/2016.      REVIEW OF SYSTEMS  : All other systems reviewed and negative except where noted in the History of Present Illness.   PHYSICAL EXAM: BP (!) 98/50   Pulse 80   Ht _0  (1.651 m)   Wt 120 lb (54.4 kg)   BMI 19.97 kg/m  General: Well developed white female in no acute distress Head: Normocephalic and atraumatic Eyes:  Sclerae anicteric, conjunctiva pink. Ears: Normal auditory acuity Lungs: Clear throughout to auscultation Heart: Regular rate and rhythm Abdomen: Soft, non-distended.  Normal bowel sounds. Mild diffuse TTP. Musculoskeletal: Symmetrical with no gross deformities  Skin: No lesions on visible extremities Extremities: No edema  Neurological: Alert oriented x 4, grossly non-focal Psychological:  Alert and cooperative. Normal mood and affect  ASSESSMENT AND PLAN: -Diarrhea:   She's had issues with diarrhea due to her Cellcept.  This diarrhea is more acute.  She has been on a few rounds of antibiotics for respiratory illnesses recently.  Had a negative Cdiff on 2/28.  Diarrhea restarted on 3/6 more severe.  No improvement with holding Cellcept this time.  Will check a GI pathogen panel, Cdiff included.  Can continue Pepto-bismol for now and we will have her try some levsin prn for abdominal cramping. -Personal history of colon polyps:  Patient is on continuous O2 and now on cellcept for severe restrictive lung disease.  She is at much higher risk for screening procedures at this point and decision was made by patient and Dr. GCarlean Purlto discontinue screening procedures. -Progressive restrictive lung disease with PAH and CREST syndrome:  Follows with Pulmonary at  [facility].  Now on continuous O2 and immunosuppressive therapy.  CC:  Crist Infante, MD

## 2016-05-10 ENCOUNTER — Telehealth: Payer: Self-pay | Admitting: Gastroenterology

## 2016-05-10 LAB — GASTROINTESTINAL PATHOGEN PANEL PCR
C. DIFFICILE TOX A/B, PCR: DETECTED — AB
Campylobacter, PCR: NOT DETECTED
Cryptosporidium, PCR: NOT DETECTED
E COLI (STEC) STX1/STX2, PCR: NOT DETECTED
E coli (ETEC) LT/ST PCR: NOT DETECTED
E coli 0157, PCR: NOT DETECTED
Giardia lamblia, PCR: NOT DETECTED
Norovirus, PCR: NOT DETECTED
ROTAVIRUS, PCR: NOT DETECTED
Salmonella, PCR: NOT DETECTED
Shigella, PCR: NOT DETECTED

## 2016-05-10 NOTE — Telephone Encounter (Signed)
Oncall note Informed patient the results of GI pathogen panel, positive for C.diff She already has prescription of PO Vancomycin 125 mg four time daily from PMD last week but hasn't started taking it, advised her to start it tonight. Call office is has any questions    K. Denzil Magnuson , MD [phone] Mon-Fri 8a-5p [phone] after 5p, weekends, holidays

## 2016-05-11 ENCOUNTER — Telehealth: Payer: Self-pay

## 2016-05-11 ENCOUNTER — Encounter: Payer: Self-pay | Admitting: Gastroenterology

## 2016-05-11 ENCOUNTER — Encounter (HOSPITAL_COMMUNITY): Payer: BLUE CROSS/BLUE SHIELD

## 2016-05-11 NOTE — Telephone Encounter (Signed)
Spoke to patient, gave her the recommendations from Alonza Bogus, PA-C from email received earlier today. Patient understands all directions and will start using Florastor twice daily.

## 2016-05-13 ENCOUNTER — Encounter (HOSPITAL_COMMUNITY): Payer: BLUE CROSS/BLUE SHIELD

## 2016-05-13 NOTE — Progress Notes (Signed)
Called Deborah [name] to discuss her return to the Undergraduate Pulmonary Program. She was planning to return today to class.I explained to her that she must be finished with her treatment and has a negative stool for C-diff. She voices understanding. Deborah [name] has a MD appointment next week.

## 2016-05-14 ENCOUNTER — Other Ambulatory Visit: Payer: Self-pay | Admitting: Gastroenterology

## 2016-05-14 ENCOUNTER — Telehealth: Payer: Self-pay | Admitting: Internal Medicine

## 2016-05-14 NOTE — Telephone Encounter (Signed)
The pt states she can not go to pulmonary rehab because she has C Diff.  She wants to know when she will be able to attend.  I advised her to call pulmonary and ask them what the protocol is, finished abx, or diarrhea free for a certain time frame, etc.  She will call and find out what she needs and call back if she has any further symptoms

## 2016-05-18 ENCOUNTER — Encounter (HOSPITAL_COMMUNITY): Payer: BLUE CROSS/BLUE SHIELD

## 2016-05-18 ENCOUNTER — Ambulatory Visit: Payer: BLUE CROSS/BLUE SHIELD | Admitting: Gastroenterology

## 2016-05-18 ENCOUNTER — Encounter (HOSPITAL_COMMUNITY): Admission: RE | Admit: 2016-05-18 | Payer: BLUE CROSS/BLUE SHIELD | Source: Ambulatory Visit

## 2016-05-19 ENCOUNTER — Ambulatory Visit (INDEPENDENT_AMBULATORY_CARE_PROVIDER_SITE_OTHER): Payer: BLUE CROSS/BLUE SHIELD | Admitting: Emergency Medicine

## 2016-05-19 ENCOUNTER — Encounter: Payer: Self-pay | Admitting: Emergency Medicine

## 2016-05-19 DIAGNOSIS — J849 Interstitial pulmonary disease, unspecified: Secondary | ICD-10-CM

## 2016-05-19 DIAGNOSIS — J9621 Acute and chronic respiratory failure with hypoxia: Secondary | ICD-10-CM | POA: Diagnosis not present

## 2016-05-19 DIAGNOSIS — I272 Pulmonary hypertension, unspecified: Secondary | ICD-10-CM

## 2016-05-19 DIAGNOSIS — J9622 Acute and chronic respiratory failure with hypercapnia: Secondary | ICD-10-CM | POA: Diagnosis not present

## 2016-05-19 DIAGNOSIS — J479 Bronchiectasis, uncomplicated: Secondary | ICD-10-CM

## 2016-05-19 NOTE — Assessment & Plan Note (Signed)
Continue O2 as ordered

## 2016-05-19 NOTE — Assessment & Plan Note (Signed)
In setting immunosuppression for her connective tissue disease, ILD. At baseline she does not have frequent cough or sx. Believe that we can follow her on saline nebs, mucinex. Use flutter valve prn. She will callus if / when she flares. Would like to cx sputum if this happens. Her ILD and PAH will be managed by dr's Govert and Fortin respectively.

## 2016-05-19 NOTE — Progress Notes (Signed)
Subjective:    Patient ID: Deborah [name], female    DOB: [birth year], 66 y.o.   MRN: [id number]  HPI 66 year old woman, never smoker, with a history of crest syndrome and associated interstitial lung disease, PAH. She is on Tyvaso and Adcirca (Dr Gilles Chiquito). She also has a history of a left lung resection for carcinoid tumor in 2004. She has been on chronic immunosuppression as severe bronchiectasis. She was hospitalized with an acute exacerbation and resp failure in the setting of RSV in February. Treated with ceftriaxone and clindamycin. Unfortunately she developed C diff, could not go to pulm rehab, started vancomycin, finishes this weekend.  Her mucus clearance regimen > mucinex, saline nebs.  Immunosuppression > cellcept and pred 56m  She is feeling better. No abdominal pain or diarrhea now (on oral vanco).  Minimal cough, no sputum production (her baseline).    Review of Systems  Constitutional: Negative for fever and unexpected weight change.  HENT: Positive for congestion. Negative for dental problem, ear pain, nosebleeds, postnasal drip, rhinorrhea, sinus pressure, sneezing, sore throat and trouble swallowing.   Eyes: Negative for redness and itching.  Respiratory: Negative for cough, chest tightness and wheezing.   Cardiovascular: Negative for palpitations and leg swelling.  Gastrointestinal: Negative for nausea and vomiting.  Genitourinary: Negative for dysuria.  Musculoskeletal: Negative for joint swelling.  Skin: Negative for rash.  Neurological: Negative for headaches.  Hematological: Does not bruise/bleed easily.  Psychiatric/Behavioral: Negative for dysphoric mood. The patient is nervous/anxious.    Past Medical History:  Diagnosis Date  . Anemia   . C. difficile colitis   . CAD (coronary artery disease)   . Cataract   . CHF (congestive heart failure) (HCC)    right sided heart failure from pulmonary hypertension  . COPD ([place] obstructive [place] disease)  [place]   . Gallstones   . Hearing loss    right  . Hyperactive airway disease   . Hyperlipidemia   . Hypertension   . [place]  [place]  . Osteopenia   . Pneumonia   . Raynaud's disease   . Restrictive lung disease   . Shingles   . Tubulovillous adenoma of rectum 2007     Family History  Problem Relation Age of Onset  . Adopted: Yes     Social History   Social History  . Marital status: Married    Spouse name: N/A  . Number of children: 0  . Years of education: N/A   Occupational History  . retired    Social History Main Topics  . Smoking status: Never Smoker  . Smokeless tobacco: Never Used  . Alcohol use No  . Drug use: No  . Sexual activity: No   Other Topics Concern  . Not on file   Social History Narrative  . No narrative on file     Allergies  Allergen Reactions  . Megace Es [Megestrol Acetate]     Severe leg cramps   . Megestrol Acetate     REACTION: rash  . Lodine [Etodolac] Rash    REACTION: rash     Outpatient Medications Prior to Visit  Medication Sig Dispense Refill  . albuterol (PROVENTIL HFA;VENTOLIN HFA) 108 (90 BASE) MCG/ACT inhaler Inhale 2 puffs into the lungs every 6 (six) hours as needed. For shortness of breath. 1 Inhaler 3  . atorvastatin (LIPITOR) 10 MG tablet Take 10 mg by mouth daily.    . cholecalciferol (VITAMIN D) 1000 units tablet Take 2,000  Units by mouth 2 (two) times a week.    . denosumab (PROLIA) 60 MG/ML SOLN injection Inject 60 mg into the skin every 6 (six) months. Administer in upper arm, thigh, or abdomen    . DULoxetine (CYMBALTA) 60 MG capsule Take 60 mg by mouth every evening.     . furosemide (LASIX) 40 MG tablet Take 40 mg by mouth 3 (three) times a week. M-W-F    . gabapentin (NEURONTIN) 400 MG capsule Take 400 mg by mouth 4 (four) times daily.    . hyoscyamine (LEVSIN SL) 0.125 MG SL tablet DISSOLVE 1 TABLET(0.125 MG) UNDER THE TONGUE EVERY 6 HOURS AS NEEDED 15 tablet 0  . lidocaine  (LIDODERM) 5 % Place 1 patch onto the skin daily. Remove & Discard patch within 12 hours or as directed by MD    . mometasone-formoterol (DULERA) 100-5 MCG/ACT AERO Inhale 2 puffs into the lungs 2 (two) times daily. 1 Inhaler 3  . Multiple Vitamin (MULTIVITAMIN WITH MINERALS) TABS Take 1 tablet by mouth daily.    . mycophenolate (CELLCEPT) 500 MG tablet Take 1,000 mg by mouth 2 (two) times daily.     Marland Kitchen oxycodone (OXY-IR) 5 MG capsule Take 5 mg by mouth every 6 (six) hours as needed.    . OXYGEN Inhale into the lungs. Continuous, 4 L/min when exercising and 6 L/min when sitting    . pantoprazole (PROTONIX) 40 MG tablet Take 40 mg by mouth daily.     Vladimir Faster Glycol-Propyl Glycol 0.4-0.3 % SOLN Apply 1 drop to eye at bedtime as needed (dry).     . potassium chloride SA (K-DUR,KLOR-CON) 20 MEQ tablet Take 20 mEq by mouth 3 (three) times a week. M-W-F    . predniSONE (DELTASONE) 10 MG tablet Take two tabs daily for 5 days, then 1 tab daily for 5 days (Patient taking differently: Take 5 mg by mouth daily. Take two tabs daily for 5 days, then 1 tab daily for 5 days) 15 tablet 0  . Probiotic Product (ACIDOPHILUS SUPER PROBIOTIC PO) Take by mouth 2 (two) times daily.    . sodium chloride HYPERTONIC 3 % nebulizer solution Take 3 mLs by nebulization daily.    . tadalafil, PAH, (ADCIRCA) 20 MG tablet Take 40 mg by mouth at bedtime.    . Treprostinil (TYVASO) 0.6 MG/ML SOLN Inhale 18 mcg into the lungs 4 (four) times daily. 1.31m/2.9mL   (12breaths)    . vitamin B-12 (CYANOCOBALAMIN) 1000 MCG tablet Take 1,000 mcg by mouth 2 (two) times a week. Tues and Thurs     No facility-administered medications prior to visit.         Objective:   Physical Exam Vitals:   05/19/16 1605  BP: 122/70  Pulse: (!) 102  SpO2: 98%  Weight: 120 lb 9.6 oz (54.7 kg)  Height: _0  (1.651 m)   Gen: Pleasant, well-nourished, in no distress,  normal affect  ENT: No lesions,  mouth clear,  oropharynx clear, no  postnasal drip  Neck: No JVD, no TMG, no carotid bruits  Lungs: No use of accessory muscles, no dullness to percussion, clear without rales or rhonchi  Cardiovascular: RRR, heart sounds normal, no murmur or gallops, no peripheral edema  Musculoskeletal: No deformities, no cyanosis or clubbing  Neuro: alert, non focal  Skin: Warm, no lesions or rashes     Assessment & Plan:  Acute on chronic respiratory failure with hypoxia and hypercapnia (HCC) Continue O2 as ordered  Bronchiectasis without acute  exacerbation ([place]) In setting immunosuppression for her connective tissue disease, ILD. At baseline she does not have frequent cough or sx. Believe that we can follow her on saline nebs, mucinex. Use flutter valve prn. She will callus if / when she flares. Would like to cx sputum if this happens. Her ILD and PAH will be managed by dr's Govert and Fortin respectively.   Pulmonary hypertension On Tyvaso and Adcirca, o2 Will follow with Dr Gilles Chiquito  ILD (interstitial lung disease) ([place]) In setting connective tissue disease.   Baltazar Apo, MD, PhD 05/19/2016, 5:51 PM [facility] [phone] or if no answer [phone]

## 2016-05-19 NOTE — Assessment & Plan Note (Signed)
On Tyvaso and Adcirca, o2 Will follow with Dr Gilles Chiquito

## 2016-05-19 NOTE — Patient Instructions (Signed)
Please continue your current medications as you are taking them  Follow with Drs Gilles Chiquito, Jennelle Human as planned.  Follow with Dr Lamonte Sakai in 6 months or sooner if you have any problems

## 2016-05-19 NOTE — Assessment & Plan Note (Signed)
In setting connective tissue disease.

## 2016-05-20 ENCOUNTER — Encounter (HOSPITAL_COMMUNITY): Payer: BLUE CROSS/BLUE SHIELD

## 2016-05-20 ENCOUNTER — Encounter: Payer: Self-pay | Admitting: Gastroenterology

## 2016-05-22 ENCOUNTER — Other Ambulatory Visit: Payer: Self-pay | Admitting: Gastroenterology

## 2016-05-23 ENCOUNTER — Other Ambulatory Visit: Payer: Self-pay | Admitting: Gastroenterology

## 2016-05-24 ENCOUNTER — Other Ambulatory Visit: Payer: Self-pay | Admitting: Gastroenterology

## 2016-05-24 LAB — ACID FAST CULTURE WITH REFLEXED SENSITIVITIES: ACID FAST CULTURE - AFSCU3: NEGATIVE

## 2016-05-24 LAB — ACID FAST CULTURE WITH REFLEXED SENSITIVITIES (MYCOBACTERIA)

## 2016-05-25 ENCOUNTER — Encounter (HOSPITAL_COMMUNITY): Admission: RE | Admit: 2016-05-25 | Payer: BLUE CROSS/BLUE SHIELD | Source: Ambulatory Visit

## 2016-05-25 ENCOUNTER — Encounter (HOSPITAL_COMMUNITY): Payer: BLUE CROSS/BLUE SHIELD

## 2016-05-25 ENCOUNTER — Encounter: Payer: Self-pay | Admitting: Gastroenterology

## 2016-05-27 ENCOUNTER — Encounter (HOSPITAL_COMMUNITY): Payer: BLUE CROSS/BLUE SHIELD

## 2016-06-01 ENCOUNTER — Encounter (HOSPITAL_COMMUNITY)
Admission: RE | Admit: 2016-06-01 | Discharge: 2016-06-01 | Disposition: A | Discharge status: Home / Self Care | Payer: BLUE CROSS/BLUE SHIELD | Source: Ambulatory Visit | Attending: Internal Medicine | Admitting: Internal Medicine

## 2016-06-01 ENCOUNTER — Encounter (HOSPITAL_COMMUNITY): Payer: BLUE CROSS/BLUE SHIELD

## 2016-06-01 DIAGNOSIS — Z5189 Encounter for other specified aftercare: Secondary | ICD-10-CM | POA: Insufficient documentation

## 2016-06-01 DIAGNOSIS — J9610 Chronic respiratory failure, unspecified whether with hypoxia or hypercapnia: Secondary | ICD-10-CM | POA: Insufficient documentation

## 2016-06-01 DIAGNOSIS — I272 Pulmonary hypertension, unspecified: Secondary | ICD-10-CM

## 2016-06-01 DIAGNOSIS — J479 Bronchiectasis, uncomplicated: Secondary | ICD-10-CM | POA: Insufficient documentation

## 2016-06-01 NOTE — Progress Notes (Signed)
Pulmonary Individual Treatment Plan  Patient Details  Name: Deborah [name] MRN: [id number] Date of Birth: [birth year] Referring Provider:     Pulmonary Rehab Walk Test from 02/12/2016 in [place]  Referring Provider  Dr. Nelda Marseille      Initial Encounter Date:    Pulmonary Rehab Walk Test from 02/12/2016 in [place]  Date  02/13/16  Referring Provider  Dr. Nelda Marseille      Visit Diagnosis: Pulmonary hypertension  Patient's Home Medications on Admission:   Current Outpatient Prescriptions:  .  albuterol (PROVENTIL HFA;VENTOLIN HFA) 108 (90 BASE) MCG/ACT inhaler, Inhale 2 puffs into the lungs every 6 (six) hours as needed. For shortness of breath., Disp: 1 Inhaler, Rfl: 3 .  atorvastatin (LIPITOR) 10 MG tablet, Take 10 mg by mouth daily., Disp: , Rfl:  .  cholecalciferol (VITAMIN D) 1000 units tablet, Take 2,000 Units by mouth 2 (two) times a week., Disp: , Rfl:  .  denosumab (PROLIA) 60 MG/ML SOLN injection, Inject 60 mg into the skin every 6 (six) months. Administer in upper arm, thigh, or abdomen, Disp: , Rfl:  .  DULoxetine (CYMBALTA) 60 MG capsule, Take 60 mg by mouth every evening. , Disp: , Rfl:  .  furosemide (LASIX) 40 MG tablet, Take 40 mg by mouth 3 (three) times a week. M-W-F, Disp: , Rfl:  .  gabapentin (NEURONTIN) 400 MG capsule, Take 400 mg by mouth 4 (four) times daily., Disp: , Rfl:  .  hyoscyamine (LEVSIN SL) 0.125 MG SL tablet, DISSOLVE 1 TABLET(0.125 MG) UNDER THE TONGUE EVERY 6 HOURS AS NEEDED, Disp: 15 tablet, Rfl: 0 .  hyoscyamine (LEVSIN SL) 0.125 MG SL tablet, DISSOLVE 1 TABLET(0.125 MG) UNDER THE TONGUE EVERY 6 HOURS AS NEEDED, Disp: 15 tablet, Rfl: 0 .  hyoscyamine (LEVSIN SL) 0.125 MG SL tablet, DISSOLVE 1 TABLET(0.125 MG) UNDER THE TONGUE EVERY 6 HOURS AS NEEDED, Disp: 15 tablet, Rfl: 0 .  lidocaine (LIDODERM) 5 %, Place 1 patch onto the skin daily. Remove & Discard patch within 12 hours or as  directed by MD, Disp: , Rfl:  .  mometasone-formoterol (DULERA) 100-5 MCG/ACT AERO, Inhale 2 puffs into the lungs 2 (two) times daily., Disp: 1 Inhaler, Rfl: 3 .  Multiple Vitamin (MULTIVITAMIN WITH MINERALS) TABS, Take 1 tablet by mouth daily., Disp: , Rfl:  .  mycophenolate (CELLCEPT) 500 MG tablet, Take 1,000 mg by mouth 2 (two) times daily. , Disp: , Rfl:  .  oxycodone (OXY-IR) 5 MG capsule, Take 5 mg by mouth every 6 (six) hours as needed., Disp: , Rfl:  .  OXYGEN, Inhale into the lungs. Continuous, 4 L/min when exercising and 6 L/min when sitting, Disp: , Rfl:  .  pantoprazole (PROTONIX) 40 MG tablet, Take 40 mg by mouth daily. , Disp: , Rfl:  .  Polyethyl Glycol-Propyl Glycol 0.4-0.3 % SOLN, Apply 1 drop to eye at bedtime as needed (dry). , Disp: , Rfl:  .  potassium chloride SA (K-DUR,KLOR-CON) 20 MEQ tablet, Take 20 mEq by mouth 3 (three) times a week. M-W-F, Disp: , Rfl:  .  predniSONE (DELTASONE) 10 MG tablet, Take two tabs daily for 5 days, then 1 tab daily for 5 days (Patient taking differently: Take 5 mg by mouth daily. Take two tabs daily for 5 days, then 1 tab daily for 5 days), Disp: 15 tablet, Rfl: 0 .  Probiotic Product (ACIDOPHILUS SUPER PROBIOTIC PO), Take by mouth 2 (two) times daily.,  Disp: , Rfl:  .  sodium chloride HYPERTONIC 3 % nebulizer solution, Take 3 mLs by nebulization daily., Disp: , Rfl:  .  tadalafil, PAH, (ADCIRCA) 20 MG tablet, Take 40 mg by mouth at bedtime., Disp: , Rfl:  .  Treprostinil (TYVASO) 0.6 MG/ML SOLN, Inhale 18 mcg into the lungs 4 (four) times daily. 1.42m/2.9mL   (12breaths), Disp: , Rfl:  .  vitamin B-12 (CYANOCOBALAMIN) 1000 MCG tablet, Take 1,000 mcg by mouth 2 (two) times a week. Tues and Thurs, Disp: , Rfl:   Past Medical History: Past Medical History:  Diagnosis Date  . Anemia   . C. difficile colitis   . CAD (coronary artery disease)   . Cataract   . CHF (congestive heart failure) (HCC)    right sided heart failure from pulmonary  hypertension  . COPD ([place] obstructive pulmonary disease) [place]   . Gallstones   . Hearing loss    right  . Hyperactive airway disease   . Hyperlipidemia   . Hypertension   . [place]  [place]  . Osteopenia   . Pneumonia   . Raynaud's disease   . Restrictive lung disease   . Shingles   . Tubulovillous adenoma of rectum 2007    Tobacco Use: History  Smoking Status  . Never Smoker  Smokeless Tobacco  . Never Used    Labs: Recent Review Flowsheet Data    Labs for ITP Cardiac and Pulmonary Rehab Latest Ref Rng & Units 03/01/2011 04/05/2016 04/05/2016 04/05/2016 04/06/2016   Cholestrol 0 - 200 mg/dL 211(H) - - - -   LDLDIRECT mg/dL 143.1 - - - -   HDL >39.00 mg/dL 44.00 - - - -   Trlycerides 0.0 - 149.0 mg/dL 105.0 - - - -   Hemoglobin A1c 4.8 - 5.6 % - - - - 5.5   HCO3 20.0 - 28.0 mmol/L - 31.9(H) - 33.6(H) -   TCO2 0 - 100 mmol/L - 34 30 35 -   O2SAT % - 50.0 - 98.0 -      Capillary Blood Glucose: Lab Results  Component Value Date   GLUCAP 202 (H) 04/06/2016   GLUCAP 157 (H) 04/06/2016   GLUCAP 152 (H) 04/06/2016   GLUCAP 147 (H) 04/05/2016     ADL UCSD:     Pulmonary Assessment Scores    Row Name 02/25/16 1121         ADL UCSD   ADL Phase Entry     SOB Score total 67        Pulmonary Function Assessment:     Pulmonary Function Assessment - 02/06/16 1502      Breath   Bilateral Breath Sounds Other   Other fine crackles bases bilat right greater than left   Shortness of Breath Fear of Shortness of Breath;Limiting activity      Exercise Target Goals:    Exercise Program Goal: Individual exercise prescription set with THRR, safety & activity barriers. Participant demonstrates ability to understand and report RPE using BORG scale, to self-measure pulse accurately, and to acknowledge the importance of the exercise prescription.  Exercise Prescription Goal: Starting with aerobic activity 30 plus minutes a day, 3 days per week  for initial exercise prescription. Provide home exercise prescription and guidelines that participant acknowledges understanding prior to discharge.  Activity Barriers & Risk Stratification:     Activity Barriers & Cardiac Risk Stratification - 02/06/16 1500      Activity Barriers & Cardiac Risk Stratification  Activity Barriers Deconditioning;Muscular Weakness;Shortness of Breath      6 Minute Walk:     6 Minute Walk    Row Name 02/13/16 0744 02/26/16 1605       6 Minute Walk   Phase Initial Mid Program  for doctors office concerning o2 needs    Distance 552 feet 675 feet    Walk Time -  5 minutes and 45 seconds 6 minutes    # of Rest Breaks 1  15 seconds 0    MPH 1.04 1.27    METS 1.77 1.92    RPE 14 16    Perceived Dyspnea  4 3    Symptoms No No    Resting HR 124 bpm 124 bpm    Resting BP 140/70 150/66    Max Ex. HR 142 bpm 139 bpm    Max Ex. BP 160/60 210/80    2 Minute Post BP 124/64 150/50      Interval HR   Baseline HR 124 124    1 Minute HR 124 128    2 Minute HR 128 130    3 Minute HR 132 132    4 Minute HR 135 139    5 Minute HR 139 139    6 Minute HR 142 136    2 Minute Post HR 122 128    Interval Heart Rate? Yes Yes      Interval Oxygen   Interval Oxygen? Yes Yes    Baseline Oxygen Saturation % 94 % 94 %    Baseline Liters of Oxygen 4 L 4 L    1 Minute Oxygen Saturation % 92 % 92 %    1 Minute Liters of Oxygen 4 L 4 L    2 Minute Oxygen Saturation % 89 % 90 %    2 Minute Liters of Oxygen 4 L 4 L    3 Minute Oxygen Saturation % 89 % 89 %    3 Minute Liters of Oxygen 4 L 4 L    4 Minute Oxygen Saturation % 88 % 88 %    4 Minute Liters of Oxygen 4 L 4 L    5 Minute Oxygen Saturation % 87 % 90 %    5 Minute Liters of Oxygen 4 L 6 L    6 Minute Oxygen Saturation % 89 % 91 %    6 Minute Liters of Oxygen 4 L 6 L    2 Minute Post Oxygen Saturation % 100 % 94 %    2 Minute Post Liters of Oxygen 4 L 6 L       Oxygen Initial  Assessment:   Oxygen Re-Evaluation:     Oxygen Re-Evaluation    Row Name 06/01/16 0955             Program Oxygen Prescription   Program Oxygen Prescription Continuous       Liters per minute 6         Home Oxygen   Home Oxygen Device E-Tanks       Sleep Oxygen Prescription Continuous       Liters per minute 4       Home Exercise Oxygen Prescription Continuous       Liters per minute 6       Home at Rest Exercise Oxygen Prescription Continuous       Liters per minute 4       Compliance with Home Oxygen Use Yes  Goals/Expected Outcomes   Short Term Goals To learn and exhibit compliance with exercise, home and travel O2 prescription       Long  Term Goals Exhibits compliance with exercise, home and travel O2 prescription          Oxygen Discharge (Final Oxygen Re-Evaluation):     Oxygen Re-Evaluation - 06/01/16 0955      Program Oxygen Prescription   Program Oxygen Prescription Continuous   Liters per minute 6     Home Oxygen   Home Oxygen Device E-Tanks   Sleep Oxygen Prescription Continuous   Liters per minute 4   Home Exercise Oxygen Prescription Continuous   Liters per minute 6   Home at Rest Exercise Oxygen Prescription Continuous   Liters per minute 4   Compliance with Home Oxygen Use Yes     Goals/Expected Outcomes   Short Term Goals To learn and exhibit compliance with exercise, home and travel O2 prescription   Long  Term Goals Exhibits compliance with exercise, home and travel O2 prescription      Initial Exercise Prescription:     Initial Exercise Prescription - 02/13/16 0700      Date of Initial Exercise RX and Referring Provider   Date 02/13/16   Referring Provider Dr. Nelda Marseille     Oxygen   Oxygen Continuous   Liters 4     Bike   Level 0.3   Minutes 17     NuStep   Level 1   Minutes 17   METs 1.5     Track   Laps 5   Minutes 17     Prescription Details   Frequency (times per week) 2   Duration Progress to 45  minutes of aerobic exercise without signs/symptoms of physical distress     Intensity   THRR 40-80% of Max Heartrate 63-126   Ratings of Perceived Exertion 11-13   Perceived Dyspnea 0-4     Progression   Progression Continue progressive overload as per policy without signs/symptoms or physical distress.     Resistance Training   Training Prescription Yes   Weight orange bands   Reps 10-12      Perform Capillary Blood Glucose checks as needed.  Exercise Prescription Changes:     Exercise Prescription Changes    Row Name 02/19/16 1500 02/24/16 1500 02/26/16 1600 03/02/16 1500 03/04/16 1600     Response to Exercise   Blood Pressure (Admit) 140/60 102/60 150/66 92/50 110/40   Blood Pressure (Exercise) 132/60 160/80 210/80  she will add her lasix back in her medicines,  126/60  -   Blood Pressure (Exit) 142/70 126/62 150/50 106/60 120/64   Heart Rate (Admit) 121 bpm 120 bpm 124 bpm 119 bpm 118 bpm   Heart Rate (Exercise) 122 bpm 130 bpm 139 bpm 132 bpm 122 bpm   Heart Rate (Exit) 116 bpm 115 bpm 128 bpm 114 bpm 111 bpm   Oxygen Saturation (Admit) 92 % 99 % 94 % 93 % 90 %   Oxygen Saturation (Exercise) 9592 % 89 % 88 % 89 % 96 %   Oxygen Saturation (Exit) 96 % 96 % 94 % 97 % 100 %   Rating of Perceived Exertion (Exercise)  - _0 Perceived Dyspnea (Exercise)  - _1 Duration Progress to 45 minutes of aerobic exercise without signs/symptoms of physical distress Progress to 45 minutes of aerobic exercise without signs/symptoms of physical distress Progress to 45  minutes of aerobic exercise without signs/symptoms of physical distress Progress to 45 minutes of aerobic exercise without signs/symptoms of physical distress Progress to 45 minutes of aerobic exercise without signs/symptoms of physical distress   Intensity -  40-80% HRR THRR unchanged THRR unchanged THRR unchanged THRR unchanged     Progression   Progression Continue to progress workloads to maintain  intensity without signs/symptoms of physical distress. Continue to progress workloads to maintain intensity without signs/symptoms of physical distress. Continue to progress workloads to maintain intensity without signs/symptoms of physical distress. Continue to progress workloads to maintain intensity without signs/symptoms of physical distress. Continue to progress workloads to maintain intensity without signs/symptoms of physical distress.     Resistance Training   Training Prescription _0    Weight _1    Reps 10-12  10 minutes of strength training 10-12  10 minutes of strength training 10-12  10 minutes of strength training 10-12  10 minutes of strength training 10-12  10 minutes of strength training     Interval Training   Interval Training _2      Oxygen   Oxygen  - Continuous Continuous Continuous Continuous   Liters  - _3 Recumbant Bike   Level 1 1  - 1 1   Minutes 17 17  - 17 17     NuStep   Level 1 1  - 1  -   Minutes 17 17  - 17  -   METs  - 1.4  -  -  -     Track   Laps  - 5 5  performed another 6 minute walk test during class 8 7   Minutes  - _4 Row Name 03/09/16 1515 03/16/16 1500 03/18/16 1600 03/23/16 1514 03/30/16 1500     Response to Exercise   Blood Pressure (Admit) 100/40 100/50 98/60 104/50 114/70   Blood Pressure (Exercise) 100/40 110/50 134/62 120/60 120/56   Blood Pressure (Exit) 126/72 122/70 110/62 112/62 120/60   Heart Rate (Admit) 119 bpm 116 bpm 115 bpm 120 bpm 120 bpm   Heart Rate (Exercise) 130 bpm 124 bpm 116 bpm 126 bpm 129 bpm   Heart Rate (Exit) 109 bpm 110 bpm 100 bpm 114 bpm 114 bpm   Oxygen Saturation (Admit) 95 % 99 % 95 % 94 % 99 %   Oxygen Saturation (Exercise) 91 % 93 % 90 % 94 % 91 %   Oxygen Saturation (Exit) 98 % 99 % 93 % 100 % 100 %   Rating of Perceived Exertion (Exercise) _5 Perceived Dyspnea (Exercise)  _6 Duration Progress to 45 minutes of aerobic exercise without signs/symptoms of physical distress Progress to 45 minutes of aerobic exercise without signs/symptoms of physical distress Progress to 45 minutes of aerobic exercise without signs/symptoms of physical distress Progress to 45 minutes of aerobic exercise without signs/symptoms of physical distress Progress to 45 minutes of aerobic exercise without signs/symptoms of physical distress   Intensity _7      Progression   Progression Continue to progress workloads to maintain intensity without signs/symptoms of physical distress. Continue to progress workloads to maintain intensity without signs/symptoms of physical distress. Continue to progress workloads to maintain intensity without signs/symptoms of physical distress. Continue to  progress workloads to maintain intensity without signs/symptoms of physical distress. Continue to progress workloads to maintain intensity without signs/symptoms of physical distress.     Resistance Training   Training Prescription _0    Weight _1    Reps 10-12  10 minutes of strength training 10-12  10 minutes of strength training 10-12  10 minutes of strength training 10-12  10 minutes of strength training 10-12  10 minutes of strength training     Interval Training   Interval Training _2      Oxygen   Oxygen _3    Liters _4 Recumbant Bike   Level _5 Minutes _6 NuStep   Level _7 Minutes _8 METs  -  -  - 1.6 1.6     Track   Laps 7 8  - 5 4   Minutes 17 17  - 17 17     Exercise Review   Progression  -  - Yes  - Yes   Row Name 05/04/16 1500             Response to Exercise   Blood Pressure (Admit) 138/50        Blood Pressure (Exercise) 138/64       Blood Pressure (Exit) 96/62       Heart Rate (Admit) 126 bpm       Heart Rate (Exercise) 127 bpm       Heart Rate (Exit) 119 bpm       Oxygen Saturation (Admit) 98 %       Oxygen Saturation (Exercise) 97 %       Oxygen Saturation (Exit) 100 %       Rating of Perceived Exertion (Exercise) 13       Perceived Dyspnea (Exercise) 2       Duration Progress to 45 minutes of aerobic exercise without signs/symptoms of physical distress       Intensity THRR unchanged         Progression   Progression Continue to progress workloads to maintain intensity without signs/symptoms of physical distress.         Resistance Training   Training Prescription Yes       Weight orange bands       Reps 10-15       Time 10 Minutes         Interval Training   Interval Training No         Oxygen   Oxygen Continuous       Liters 6         Recumbant Bike   Level 2       Minutes 17         NuStep   Level 2       Minutes 17       METs 1.4         Track   Laps 5       Minutes 17          Exercise Comments:     Exercise Comments    Row Name 03/16/16 0752 04/05/16 1612         Exercise Comments Patient has only attended 6 exercise sessions. She is  up to 8 laps on the track and her MET level places her at a low level. Will cont. to motivate and encourage patient to increase her intensities.  Patient has been hospitalized. Will cont. to monitor and progress when patient returns.          Exercise Goals and Review:   Exercise Goals Re-Evaluation :     Exercise Goals Re-Evaluation    Row Name 05/03/16 1154 06/01/16 0739           Exercise Goal Re-Evaluation   Exercise Goals Review Increase Physical Activity;Increase Strenth and Stamina Increase Physical Activity;Increase Strenth and Stamina      Comments Patient was hospitalized and has been out of rehab since 03/30/16. Patient will be back in rehab this week. Will cont. to monitor and progress.  Patient has been out of rehab since 05/04/16 due to infection. Patient will be back in rehab this week. Will cont. to monitor and progress.       Expected Outcomes Patient will cont. to increase endurance and stamina through the exercises here at rehab. She will also practice her breathing techinques to become less short of breath on exertion.  Patient will cont. to increase endurance and stamina through the exercises here at rehab. She will also practice her breathing techinques to become less short of breath on exertion.          Discharge Exercise Prescription (Final Exercise Prescription Changes):     Exercise Prescription Changes - 05/04/16 1500      Response to Exercise   Blood Pressure (Admit) 138/50   Blood Pressure (Exercise) 138/64   Blood Pressure (Exit) 96/62   Heart Rate (Admit) 126 bpm   Heart Rate (Exercise) 127 bpm   Heart Rate (Exit) 119 bpm   Oxygen Saturation (Admit) 98 %   Oxygen Saturation (Exercise) 97 %   Oxygen Saturation (Exit) 100 %   Rating of Perceived Exertion (Exercise) 13   Perceived Dyspnea (Exercise) 2   Duration Progress to 45 minutes of aerobic exercise without signs/symptoms of physical distress   Intensity THRR unchanged     Progression   Progression Continue to progress workloads to maintain intensity without signs/symptoms of physical distress.     Resistance Training   Training Prescription Yes   Weight orange bands   Reps 10-15   Time 10 Minutes     Interval Training   Interval Training No     Oxygen   Oxygen Continuous   Liters 6     Recumbant Bike   Level 2   Minutes 17     NuStep   Level 2   Minutes 17   METs 1.4     Track   Laps 5   Minutes 17      Nutrition:  Target Goals: Understanding of nutrition guidelines, daily intake of sodium <1536m, cholesterol <2032m calories 30% from fat and 7% or less from saturated fats, daily to have 5 or more servings of fruits and vegetables.  Biometrics:     Pre Biometrics -  02/19/16 1554      Pre Biometrics   Grip Strength 22 kg       Nutrition Therapy Plan and Nutrition Goals:     Nutrition Therapy & Goals - 03/30/16 1447      Nutrition Therapy   Diet High Calorie, High Protein     Personal Nutrition Goals   Nutrition Goal Prevent further wt loss/promote wt gain     Intervention Plan  Intervention Prescribe, educate and counsel regarding individualized specific dietary modifications aiming towards targeted core components such as weight, hypertension, lipid management, diabetes, heart failure and other comorbidities.   Expected Outcomes Short Term Goal: Understand basic principles of dietary content, such as calories, fat, sodium, cholesterol and nutrients.;Long Term Goal: Adherence to prescribed nutrition plan.      Nutrition Discharge: Rate Your Plate Scores:     Nutrition Assessments - 03/30/16 1447      Rate Your Plate Scores   Pre Score 53      Nutrition Goals Re-Evaluation:   Nutrition Goals Discharge (Final Nutrition Goals Re-Evaluation):   Psychosocial: Target Goals: Acknowledge presence or absence of significant depression and/or stress, maximize coping skills, provide positive support system. Participant is able to verbalize types and ability to use techniques and skills needed for reducing stress and depression.  Initial Review & Psychosocial Screening:     Initial Psych Review & Screening - 02/06/16 [place]? Yes     Barriers   Psychosocial barriers to participate in program The patient should benefit from training in stress management and relaxation.     Screening Interventions   Interventions Encouraged to exercise      Quality of Life Scores:     Quality of Life - 02/25/16 1121      Quality of Life Scores   Health/Function Pre 9.17 %   Socioeconomic Pre 20.93 %   Psych/Spiritual Pre 14.57 %   Family Pre 10.5 %   GLOBAL Pre 13.05 %      PHQ-9: Recent Review  Flowsheet Data    Depression screen [facility] 2/9 02/06/2016 07/09/2014 01/21/2014   Decreased Interest 1 1 0   Down, Depressed, Hopeless 0 0 0   PHQ - 2 Score 1 1 0     Interpretation of Total Score  Total Score Depression Severity:  1-4 = Minimal depression, 5-9 = Mild depression, 10-14 = Moderate depression, 15-19 = Moderately severe depression, 20-27 = Severe depression   Psychosocial Evaluation and Intervention:     Psychosocial Evaluation - 02/06/16 1504      Psychosocial Evaluation & Interventions   Interventions Encouraged to exercise with the program and follow exercise prescription      Psychosocial Re-Evaluation:     Psychosocial Re-Evaluation    [place] Name 02/19/16 1633 03/15/16 1458 04/06/16 0907 04/29/16 0836 06/01/16 0958     Psychosocial Re-Evaluation   Current issues with  -  -  - None Identified None Identified   Comments no psychosocial issue identified no psychosocial issues identified good support system, no psychosocial issues identified at this time  -  -   Interventions Encouraged to attend Pulmonary Rehabilitation for the exercise Encouraged to attend Pulmonary Rehabilitation for the exercise Encouraged to attend Pulmonary Rehabilitation for the exercise Encouraged to attend Pulmonary Rehabilitation for the exercise Encouraged to attend Pulmonary Rehabilitation for the exercise   Continue Psychosocial Services  No No No No Follow up required No Follow up required      Psychosocial Discharge (Final Psychosocial Re-Evaluation):     Psychosocial Re-Evaluation - 06/01/16 0958      Psychosocial Re-Evaluation   Current issues with None Identified   Interventions Encouraged to attend Pulmonary Rehabilitation for the exercise   Continue Psychosocial Services  No Follow up required      Education: Education Goals: Education classes will be provided on a weekly basis, covering required topics. Participant will state understanding/return  demonstration of topics  presented.  Learning Barriers/Preferences:     Learning Barriers/Preferences - 02/06/16 1500      Learning Barriers/Preferences   Learning Barriers None   Learning Preferences Computer/Internet;Group Instruction;Verbal Instruction;Written Material      Education Topics: Risk Factor Reduction:  -Group instruction that is supported by a PowerPoint presentation. Instructor discusses the definition of a risk factor, different risk factors for pulmonary disease, and how the heart and lungs work together.     Nutrition for Pulmonary Patient:  -Group instruction provided by PowerPoint slides, verbal discussion, and written materials to support subject matter. The instructor gives an explanation and review of healthy diet recommendations, which includes a discussion on weight management, recommendations for fruit and vegetable consumption, as well as protein, fluid, caffeine, fiber, sodium, sugar, and alcohol. Tips for eating when patients are short of breath are discussed.   PULMONARY REHAB OTHER RESPIRATORY from 03/18/2016 in [place]  Date  03/04/16  Educator  [place]  Instruction Review Code  2- meets goals/outcomes      Pursed Lip Breathing:  -Group instruction that is supported by demonstration and informational handouts. Instructor discusses the benefits of pursed lip and diaphragmatic breathing and detailed demonstration on how to preform both.     Oxygen Safety:  -Group instruction provided by PowerPoint, verbal discussion, and written material to support subject matter. There is an overview of "What is Oxygen" and "Why do we need it".  Instructor also reviews how to create a safe environment for oxygen use, the importance of using oxygen as prescribed, and the risks of noncompliance. There is a brief discussion on traveling with oxygen and resources the patient may utilize.   Oxygen Equipment:  -Group instruction provided by [facility] Staff  utilizing handouts, written materials, and equipment demonstrations.   PULMONARY REHAB OTHER RESPIRATORY from 03/18/2016 in [place]  Date  02/26/16  Educator  [employer]  Instruction Review Code  2- meets goals/outcomes      Signs and Symptoms:  -Group instruction provided by written material and verbal discussion to support subject matter. Warning signs and symptoms of infection, stroke, and heart attack are reviewed and when to call the physician/911 reinforced. Tips for preventing the spread of infection discussed.   Advanced Directives:  -Group instruction provided by verbal instruction and written material to support subject matter. Instructor reviews Advanced Directive laws and proper instruction for filling out document.   Pulmonary Video:  -Group video education that reviews the importance of medication and oxygen compliance, exercise, good nutrition, pulmonary hygiene, and pursed lip and diaphragmatic breathing for the pulmonary patient.   PULMONARY REHAB OTHER RESPIRATORY from 03/18/2016 in   Date  02/19/16  Educator  video  Instruction Review Code  2- meets goals/outcomes      Exercise for the Pulmonary Patient:  -Group instruction that is supported by a PowerPoint presentation. Instructor discusses benefits of exercise, core components of exercise, frequency, duration, and intensity of an exercise routine, importance of utilizing pulse oximetry during exercise, safety while exercising, and options of places to exercise outside of rehab.     Pulmonary Medications:  -Verbally interactive group education provided by instructor with focus on inhaled medications and proper administration.   PULMONARY REHAB OTHER RESPIRATORY from 03/18/2016 in [place]  Date  03/18/16  Educator  Pharm  Instruction Review Code  2- meets goals/outcomes      Anatomy  and Physiology of the  Respiratory System and Intimacy:  -Group instruction provided by PowerPoint, verbal discussion, and written material to support subject matter. Instructor reviews respiratory cycle and anatomical components of the respiratory system and their functions. Instructor also reviews differences in obstructive and restrictive respiratory diseases with examples of each. Intimacy, Sex, and Sexuality differences are reviewed with a discussion on how relationships can change when diagnosed with pulmonary disease. Common sexual concerns are reviewed.   Knowledge Questionnaire Score:     Knowledge Questionnaire Score - 02/25/16 1120      Knowledge Questionnaire Score   Pre Score 13/13      Core Components/Risk Factors/Patient Goals at Admission:     Personal Goals and Risk Factors at Admission - 02/19/16 1631      Core Components/Risk Factors/Patient Goals on Admission    Weight Management Weight Maintenance      Core Components/Risk Factors/Patient Goals Review:      Goals and Risk Factor Review    Row Name 02/19/16 1632 03/15/16 1456 04/06/16 0906 04/29/16 0834 06/01/16 0957     Core Components/Risk Factors/Patient Goals Review   Personal Goals Review Increase Strength and Stamina;Improve shortness of breath with ADL's;Weight Management/Obesity Increase Strength and Stamina;Improve shortness of breath with ADL's;Weight Management/Obesity  weight gain, not weight loss Increase Strength and Stamina;Improve shortness of breath with ADL's;Weight Management/Obesity Improve shortness of breath with ADL's;Weight Management/Obesity Weight Management/Obesity;Improve shortness of breath with ADL's;Develop more efficient breathing techniques such as purse lipped breathing and diaphragmatic breathing and practicing self-pacing with activity.   Review Just started program today, work on [employer] management, too early to see any progression. 6 sessions completed, looks much better than at start of program,  adjusting well Presently hospitalized for respiratory failure and atelectasis Released from hospital 2 weeks ago, is working with home PT, in hopes of returning yo pulmonary rehab next week developed c-diff, has completed antibiotic treatment, will return to the program this week   Expected Outcomes  - continue to progress with workload increases Continue to work on goals when she is able to return to program continue to work on goals has been absent 1 month, will be starting all over, progress as tolerated      Core Components/Risk Factors/Patient Goals at Discharge (Final Review):      Goals and Risk Factor Review - 06/01/16 0957      Core Components/Risk Factors/Patient Goals Review   Personal Goals Review Weight Management/Obesity;Improve shortness of breath with ADL's;Develop more efficient breathing techniques such as purse lipped breathing and diaphragmatic breathing and practicing self-pacing with activity.   Review developed c-diff, has completed antibiotic treatment, will return to the program this week   Expected Outcomes has been absent 1 month, will be starting all over, progress as tolerated      ITP Comments:   Comments: ITP REVIEW Pt is making expected progress toward pulmonary rehab goals after completing 11 sessions. Recommend continued exercise, life style modification, education, and utilization of breathing techniques to increase stamina and strength and decrease shortness of breath with exertion.

## 2016-06-03 ENCOUNTER — Encounter (HOSPITAL_COMMUNITY)
Admission: RE | Admit: 2016-06-03 | Discharge: 2016-06-03 | Disposition: A | Discharge status: Home / Self Care | Payer: BLUE CROSS/BLUE SHIELD | Source: Ambulatory Visit | Attending: Internal Medicine | Admitting: Internal Medicine

## 2016-06-03 VITALS — Wt 121.3 lb

## 2016-06-03 DIAGNOSIS — I272 Pulmonary hypertension, unspecified: Secondary | ICD-10-CM

## 2016-06-03 DIAGNOSIS — J9610 Chronic respiratory failure, unspecified whether with hypoxia or hypercapnia: Secondary | ICD-10-CM | POA: Diagnosis not present

## 2016-06-03 DIAGNOSIS — Z5189 Encounter for other specified aftercare: Secondary | ICD-10-CM | POA: Diagnosis present

## 2016-06-03 DIAGNOSIS — J479 Bronchiectasis, uncomplicated: Secondary | ICD-10-CM | POA: Diagnosis not present

## 2016-06-03 NOTE — Progress Notes (Signed)
Daily Session Note  Patient Details  Name: Deborah [name] MRN: [id number] Date of Birth: 11/03/1950 Referring Provider:     Pulmonary Rehab Walk Test from 02/12/2016 in [place]  Referring Provider  Dr. Nelda Marseille      Encounter Date: 06/03/2016  Check In:     Session Check In - 06/03/16 1338      Check-In   Location [facility]   Staff Present Su Hilt, MS, ACSM RCEP, Exercise Physiologist;Joan Leonia Reeves, RN, BSN;Mona Ayars Ysidro Evert, RN;Portia Rollene Rotunda, RN, BSN   Supervising physician immediately available to respond to emergencies [facility] immediately available   Physician(s) Dr. Cathlean Sauer   Medication changes reported     No   Fall or balance concerns reported    No   Tobacco Cessation No Change   Warm-up and Cool-down Performed as group-led instruction   Resistance Training Performed Yes   VAD Patient? No     Pain Assessment   Currently in Pain? No/denies   Multiple Pain Sites No      Capillary Blood Glucose: No results found. However, due to the size of the patient record, not all encounters were searched. Please check Results Review for a complete set of results.      Exercise Prescription Changes - 06/03/16 1500      Response to Exercise   Blood Pressure (Admit) 132/64   Blood Pressure (Exercise) 170/76   Blood Pressure (Exit) 140/70   Heart Rate (Admit) 108 bpm   Heart Rate (Exercise) 121 bpm   Heart Rate (Exit) 103 bpm   Oxygen Saturation (Admit) 97 %   Oxygen Saturation (Exercise) 94 %   Oxygen Saturation (Exit) 99 %   Rating of Perceived Exertion (Exercise) 13   Perceived Dyspnea (Exercise) 1.5   Duration Progress to 45 minutes of aerobic exercise without signs/symptoms of physical distress   Intensity THRR unchanged     Progression   Progression Continue to progress workloads to maintain intensity without signs/symptoms of physical distress.     Resistance Training   Training Prescription Yes   Weight orange bands   Reps 10-15   Time 10 Minutes     Interval Training   Interval Training No     Oxygen   Oxygen Continuous   Liters 6     Recumbant Bike   Level 1  Pt. decreased her workload due to her recent absences.   Minutes Antoine 8   Minutes 17      History  Smoking Status  . Never Smoker  Smokeless Tobacco  . Never Used    Goals Met:  Exercise tolerated well No report of cardiac concerns or symptoms Strength training completed today  Goals Unmet:  Not Applicable   Comments: Service time is from 1330 to 1505    Dr. Rush Farmer is Medical Director for Pulmonary Rehab at [facility].

## 2016-06-08 ENCOUNTER — Encounter (HOSPITAL_COMMUNITY)
Admission: RE | Admit: 2016-06-08 | Discharge: 2016-06-08 | Disposition: A | Discharge status: Home / Self Care | Payer: BLUE CROSS/BLUE SHIELD | Source: Ambulatory Visit | Attending: Internal Medicine | Admitting: Internal Medicine

## 2016-06-08 VITALS — Wt 121.9 lb

## 2016-06-08 DIAGNOSIS — Z5189 Encounter for other specified aftercare: Secondary | ICD-10-CM | POA: Diagnosis not present

## 2016-06-08 DIAGNOSIS — I272 Pulmonary hypertension, unspecified: Secondary | ICD-10-CM

## 2016-06-08 NOTE — Progress Notes (Signed)
Daily Session Note  Patient Details  Name: Deborah [name] MRN: [id number] Date of Birth: 30-Jun-1950 Referring Provider:     Pulmonary Rehab Walk Test from 02/12/2016 in [place]  Referring Provider  Dr. Nelda Marseille      Encounter Date: 06/08/2016  Check In:     Session Check In - 06/08/16 1330      Check-In   [facility]   Staff Present Su Hilt, MS, ACSM RCEP, Exercise Physiologist;Joan Leonia Reeves, RN, BSN;Lisa Hughes, RN;Klayten Jolliff Rollene Rotunda, RN, BSN   Supervising physician immediately available to respond to emergencies [facility] immediately available   Physician(s) Dr. Cathlean Sauer   Medication changes reported     No   Fall or balance concerns reported    No   Tobacco Cessation No Change   Warm-up and Cool-down Performed as group-led instruction   Resistance Training Performed Yes   VAD Patient? No     Pain Assessment   Currently in Pain? No/denies   Multiple Pain Sites No      Capillary Blood Glucose: No results found. However, due to the size of the patient record, not all encounters were searched. Please check Results Review for a complete set of results.      Exercise Prescription Changes - 06/08/16 1523      Response to Exercise   Blood Pressure (Admit) 130/78   Blood Pressure (Exercise) 130/70   Blood Pressure (Exit) 136/70   Heart Rate (Admit) 113 bpm   Heart Rate (Exercise) 122 bpm   Heart Rate (Exit) 109 bpm   Oxygen Saturation (Admit) 91 %   Oxygen Saturation (Exercise) 92 %   Oxygen Saturation (Exit) 98 %   Rating of Perceived Exertion (Exercise) 13   Perceived Dyspnea (Exercise) 1.5   Duration Progress to 45 minutes of aerobic exercise without signs/symptoms of physical distress   Intensity THRR unchanged     Progression   Progression Continue to progress workloads to maintain intensity without signs/symptoms of physical distress.     Resistance Training   Training Prescription Yes   Weight orange bands   Reps 10-15   Time 10 Minutes     Interval Training   Interval Training No     Oxygen   Oxygen Continuous   Liters 6     Recumbant Bike   Level 2   Minutes 17     NuStep   Level 2   Minutes 17   METs 1.5     Track   Laps 8   Minutes 17      History  Smoking Status  . Never Smoker  Smokeless Tobacco  . Never Used    Goals Met:  Using PLB without cueing & demonstrates good technique Exercise tolerated well No report of cardiac concerns or symptoms Strength training completed today  Goals Unmet:  Not Applicable  Comments: Service time is from 1330 to 1500   Dr. Rush Farmer is Medical Director for Pulmonary Rehab at [facility].

## 2016-06-10 ENCOUNTER — Encounter (HOSPITAL_COMMUNITY)
Admission: RE | Admit: 2016-06-10 | Discharge: 2016-06-10 | Disposition: A | Discharge status: Home / Self Care | Payer: BLUE CROSS/BLUE SHIELD | Source: Ambulatory Visit | Attending: Internal Medicine | Admitting: Internal Medicine

## 2016-06-10 VITALS — Wt 122.4 lb

## 2016-06-10 DIAGNOSIS — Z5189 Encounter for other specified aftercare: Secondary | ICD-10-CM | POA: Diagnosis not present

## 2016-06-10 DIAGNOSIS — I272 Pulmonary hypertension, unspecified: Secondary | ICD-10-CM

## 2016-06-10 NOTE — Progress Notes (Signed)
Daily Session Note  Patient Details  Name: Deborah [name] MRN: [id number] Date of Birth: [birth year] Referring Provider:     Pulmonary Rehab Walk Test from 02/12/2016 in [place]  Referring Provider  Dr. Nelda Marseille      Encounter Date: 06/10/2016  Check In:     Session Check In - 06/10/16 1330      Check-In   [facility]   Staff Present Su Hilt, MS, ACSM RCEP, Exercise Physiologist;Joan Leonia Reeves, RN, Luisa Hart, RN, BSN   Supervising physician immediately available to respond to emergencies [facility] immediately available   Physician(s) Dr. Candiss Norse   Medication changes reported     No   Fall or balance concerns reported    No   Tobacco Cessation No Change   Warm-up and Cool-down Performed as group-led instruction   Resistance Training Performed Yes   VAD Patient? No     Pain Assessment   Currently in Pain? No/denies   Multiple Pain Sites No      Capillary Blood Glucose: No results found. However, due to the size of the patient record, not all encounters were searched. Please check Results Review for a complete set of results.      Exercise Prescription Changes - 06/10/16 1642      Response to Exercise   Blood Pressure (Admit) 120/60   Blood Pressure (Exercise) 144/70   Blood Pressure (Exit) 144/82   Heart Rate (Admit) 115 bpm   Heart Rate (Exercise) 126 bpm   Heart Rate (Exit) 106 bpm   Oxygen Saturation (Admit) 94 %   Oxygen Saturation (Exercise) 92 %   Oxygen Saturation (Exit) 97 %   Rating of Perceived Exertion (Exercise) 13   Perceived Dyspnea (Exercise) 2   Duration Progress to 45 minutes of aerobic exercise without signs/symptoms of physical distress   Intensity THRR unchanged     Progression   Progression Continue to progress workloads to maintain intensity without signs/symptoms of physical distress.     Resistance Training   Training Prescription Yes   Weight orange  bands   Reps 10-15   Time 10 Minutes     Interval Training   Interval Training No     Oxygen   Oxygen Continuous   Liters 6     NuStep   Level 2   Minutes 17   METs 1.6     Track   Laps 6   Minutes 17      History  Smoking Status  . Never Smoker  Smokeless Tobacco  . Never Used    Goals Met:  Using PLB without cueing & demonstrates good technique Exercise tolerated well No report of cardiac concerns or symptoms Strength training completed today  Goals Unmet:  Not Applicable  Comments: Service time is from 1330 to 1530   Dr. Rush Farmer is Medical Director for Pulmonary Rehab at [facility].

## 2016-06-15 ENCOUNTER — Encounter (HOSPITAL_COMMUNITY)
Admission: RE | Admit: 2016-06-15 | Discharge: 2016-06-15 | Disposition: A | Discharge status: Home / Self Care | Payer: BLUE CROSS/BLUE SHIELD | Source: Ambulatory Visit | Attending: Internal Medicine | Admitting: Internal Medicine

## 2016-06-15 VITALS — Wt 122.4 lb

## 2016-06-15 DIAGNOSIS — I272 Pulmonary hypertension, unspecified: Secondary | ICD-10-CM

## 2016-06-15 DIAGNOSIS — Z5189 Encounter for other specified aftercare: Secondary | ICD-10-CM | POA: Diagnosis not present

## 2016-06-15 NOTE — Progress Notes (Signed)
Daily Session Note  Patient Details  Name: Deborah [name] MRN: [id number] Date of Birth: September 18, 1950 Referring Provider:     Pulmonary Rehab Walk Test from 02/12/2016 in [place]  Referring Provider  Dr. Nelda Marseille      Encounter Date: 06/15/2016  Check In:     Session Check In - 06/15/16 1327      Check-In   [facility]   Staff Present Su Hilt, MS, ACSM RCEP, Exercise Physiologist;Joan Leonia Reeves, RN, Luisa Hart, RN, Roque Cash, RN   Supervising physician immediately available to respond to emergencies [facility] immediately available   Physician(s) Dr. Candiss Norse   Medication changes reported     No   Fall or balance concerns reported    No   Tobacco Cessation No Change   Warm-up and Cool-down Performed as group-led instruction   Resistance Training Performed Yes   VAD Patient? No     Pain Assessment   Currently in Pain? No/denies   Multiple Pain Sites No      Capillary Blood Glucose: No results found. However, due to the size of the patient record, not all encounters were searched. Please check Results Review for a complete set of results.      Exercise Prescription Changes - 06/15/16 1541      Response to Exercise   Blood Pressure (Admit) 124/60   Blood Pressure (Exercise) 144/68   Blood Pressure (Exit) 130/60   Heart Rate (Admit) 113 bpm   Heart Rate (Exercise) 128 bpm   Heart Rate (Exit) 112 bpm   Oxygen Saturation (Admit) 96 %   Oxygen Saturation (Exercise) 96 %   Oxygen Saturation (Exit) 96 %   Rating of Perceived Exertion (Exercise) 13   Perceived Dyspnea (Exercise) 1   Duration Progress to 45 minutes of aerobic exercise without signs/symptoms of physical distress   Intensity THRR unchanged     Progression   Progression Continue to progress workloads to maintain intensity without signs/symptoms of physical distress.     Resistance Training   Training Prescription Yes   Weight orange bands   Reps 10-15   Time 10 Minutes     Interval Training   Interval Training No     Oxygen   Oxygen Continuous   Liters 6     Recumbant Bike   Level 1.5   Minutes 17     NuStep   Level 2   Minutes 17   METs 1.7     Track   Laps 8   Minutes 17     Exercise Review   Progression Yes      History  Smoking Status  . Never Smoker  Smokeless Tobacco  . Never Used    Goals Met:  Using PLB without cueing & demonstrates good technique Exercise tolerated well No report of cardiac concerns or symptoms Strength training completed today  Goals Unmet:  HR  Comments: Service time is from 1330 to 1500   Dr. Rush Farmer is Medical Director for Pulmonary Rehab at [facility].

## 2016-06-17 ENCOUNTER — Encounter (HOSPITAL_COMMUNITY)
Admission: RE | Admit: 2016-06-17 | Discharge: 2016-06-17 | Disposition: A | Discharge status: Home / Self Care | Payer: BLUE CROSS/BLUE SHIELD | Source: Ambulatory Visit | Attending: Internal Medicine | Admitting: Internal Medicine

## 2016-06-17 VITALS — Wt 122.6 lb

## 2016-06-17 DIAGNOSIS — Z5189 Encounter for other specified aftercare: Secondary | ICD-10-CM | POA: Diagnosis not present

## 2016-06-17 DIAGNOSIS — I272 Pulmonary hypertension, unspecified: Secondary | ICD-10-CM

## 2016-06-17 NOTE — Progress Notes (Signed)
Daily Session Note  Patient Details  Name: Deborah [name] MRN: [id number] Date of Birth: 1950/11/10 Referring Provider:     Pulmonary Rehab Walk Test from 02/12/2016 in [place]  Referring Provider  Dr. Nelda Marseille      Encounter Date: 06/17/2016  Check In:     Session Check In - 06/17/16 1330      Check-In   [facility]   Staff Present Rosebud Poles, RN, BSN;Molly diVincenzo, MS, ACSM RCEP, Exercise Physiologist;Lisa Ysidro Evert, RN;Portia Rollene Rotunda, RN, BSN   Supervising physician immediately available to respond to emergencies [facility] immediately available   Physician(s) Dr. Posey Pronto   Medication changes reported     No   Fall or balance concerns reported    No   Tobacco Cessation No Change   Warm-up and Cool-down Performed as group-led instruction   Resistance Training Performed Yes   VAD Patient? No     Pain Assessment   Currently in Pain? No/denies   Multiple Pain Sites No      Capillary Blood Glucose: No results found. However, due to the size of the patient record, not all encounters were searched. Please check Results Review for a complete set of results.      Exercise Prescription Changes - 06/17/16 1600      Response to Exercise   Blood Pressure (Admit) 106/50   Blood Pressure (Exercise) 132/80   Blood Pressure (Exit) 138/80   Heart Rate (Admit) 118 bpm   Heart Rate (Exercise) 131 bpm   Heart Rate (Exit) 115 bpm   Oxygen Saturation (Admit) 92 %   Oxygen Saturation (Exercise) 91 %   Oxygen Saturation (Exit) 97 %   Rating of Perceived Exertion (Exercise) 13   Perceived Dyspnea (Exercise) 1.5   Duration Progress to 45 minutes of aerobic exercise without signs/symptoms of physical distress   Intensity THRR unchanged     Progression   Progression Continue to progress workloads to maintain intensity without signs/symptoms of physical distress.     Resistance Training   Training Prescription Yes   Weight orange bands   Reps 10-15   Time 10 Minutes     Interval Training   Interval Training No     Oxygen   Oxygen Continuous   Liters 6     NuStep   Level 2   Minutes 17   METs 1.7     Track   Laps 6   Minutes 17      History  Smoking Status  . Never Smoker  Smokeless Tobacco  . Never Used    Goals Met:  Exercise tolerated well Strength training completed today  Goals Unmet:  Not Applicable  Comments: Service time is from 1330 to 1515. Attended Doctor Day lecture.    Dr. Rush Farmer is Medical Director for Pulmonary Rehab at [facility].

## 2016-06-18 ENCOUNTER — Encounter: Payer: Self-pay | Admitting: Gastroenterology

## 2016-06-22 ENCOUNTER — Encounter (HOSPITAL_COMMUNITY): Payer: BLUE CROSS/BLUE SHIELD

## 2016-06-24 ENCOUNTER — Encounter (HOSPITAL_COMMUNITY)
Admission: RE | Admit: 2016-06-24 | Discharge: 2016-06-24 | Disposition: A | Discharge status: Home / Self Care | Payer: BLUE CROSS/BLUE SHIELD | Source: Ambulatory Visit | Attending: Internal Medicine | Admitting: Internal Medicine

## 2016-06-24 VITALS — Wt 123.9 lb

## 2016-06-24 DIAGNOSIS — Z5189 Encounter for other specified aftercare: Secondary | ICD-10-CM | POA: Insufficient documentation

## 2016-06-24 DIAGNOSIS — J9610 Chronic respiratory failure, unspecified whether with hypoxia or hypercapnia: Secondary | ICD-10-CM | POA: Insufficient documentation

## 2016-06-24 DIAGNOSIS — J479 Bronchiectasis, uncomplicated: Secondary | ICD-10-CM | POA: Insufficient documentation

## 2016-06-24 DIAGNOSIS — I272 Pulmonary hypertension, unspecified: Secondary | ICD-10-CM

## 2016-06-24 NOTE — Progress Notes (Signed)
Daily Session Note  Patient Details  Name: ARIAUNA FARABEE MRN: [id number] Date of Birth: June 17, 1950 Referring Provider:     Pulmonary Rehab Walk Test from 02/12/2016 in [place]  Referring Provider  Dr. Nelda Marseille      Encounter Date: 06/24/2016  Check In:     Session Check In - 06/24/16 1359      Check-In   [facility]   Staff Present Trish Fountain, RN, BSN;Oshea Percival Ysidro Evert, RN;Joan Leonia Reeves, RN, BSN   Supervising physician immediately available to respond to emergencies [facility] immediately available   Physician(s) Dr. Sloan Leiter   Medication changes reported     No   Fall or balance concerns reported    No   Tobacco Cessation No Change   Warm-up and Cool-down Performed as group-led instruction   Resistance Training Performed Yes   VAD Patient? No     Pain Assessment   Currently in Pain? No/denies   Multiple Pain Sites No      Capillary Blood Glucose: No results found. However, due to the size of the patient record, not all encounters were searched. Please check Results Review for a complete set of results.      Exercise Prescription Changes - 06/24/16 1600      Response to Exercise   Blood Pressure (Admit) 128/64   Blood Pressure (Exercise) 136/70   Blood Pressure (Exit) 110/60   Heart Rate (Admit) 115 bpm   Heart Rate (Exercise) 125 bpm   Heart Rate (Exit) 108 bpm   Oxygen Saturation (Admit) 96 %   Oxygen Saturation (Exercise) 93 %   Oxygen Saturation (Exit) 98 %   Rating of Perceived Exertion (Exercise) 13   Perceived Dyspnea (Exercise) 1   Duration Progress to 45 minutes of aerobic exercise without signs/symptoms of physical distress   Intensity THRR unchanged     Progression   Progression Continue to progress workloads to maintain intensity without signs/symptoms of physical distress.     Resistance Training   Training Prescription Yes   Weight orange bands   Reps 10-15   Time 10 Minutes      Interval Training   Interval Training No     Oxygen   Oxygen Continuous   Liters 6     NuStep   Level 2   Minutes 17   METs 1.5     Track   Laps 8   Minutes 17      History  Smoking Status  . Never Smoker  Smokeless Tobacco  . Never Used    Goals Met:  Exercise tolerated well No report of cardiac concerns or symptoms Strength training completed today  Goals Unmet:  Not Applicable  Comments: Service time is from 1330 to 1530     Dr. Rush Farmer is Medical Director for Pulmonary Rehab at [facility].

## 2016-06-29 ENCOUNTER — Encounter (HOSPITAL_COMMUNITY)
Admission: RE | Admit: 2016-06-29 | Discharge: 2016-06-29 | Disposition: A | Discharge status: Home / Self Care | Payer: BLUE CROSS/BLUE SHIELD | Source: Ambulatory Visit | Attending: Internal Medicine | Admitting: Internal Medicine

## 2016-06-29 VITALS — Wt 123.2 lb

## 2016-06-29 DIAGNOSIS — I272 Pulmonary hypertension, unspecified: Secondary | ICD-10-CM

## 2016-06-29 DIAGNOSIS — Z5189 Encounter for other specified aftercare: Secondary | ICD-10-CM | POA: Diagnosis not present

## 2016-06-29 NOTE — Progress Notes (Signed)
[name] have reviewed a Home Exercise Prescription with Deborah [name] . Deborah [name] is  currently exercising at home.  The patient was advised to walk 3 days a week for 15x2 minutes.  Deborah [name] discussed how to progress their exercise prescription.  The patient stated that their goals were to decrease her supplemental oxygen needs and get back to where she was before she got sick.  The patient stated that they understand the exercise prescription.  We reviewed exercise guidelines, target heart rate during exercise, oxygen use, weather, home pulse oximeter, endpoints for exercise, and goals.  Patient is encouraged to come to me with any questions. [name] will continue to follow up with the patient to assist them with progression and safety.

## 2016-06-29 NOTE — Progress Notes (Signed)
Pulmonary Individual Treatment Plan  Patient Details  Name: Deborah [name] MRN: [id number] Date of Birth: 1951/01/13 Referring Provider:     Pulmonary Rehab Walk Test from 02/12/2016 in [place]  Referring Provider  Dr. Nelda Marseille      Initial Encounter Date:    Pulmonary Rehab Walk Test from 02/12/2016 in [place]  Date  02/13/16  Referring Provider  Dr. Nelda Marseille      Visit Diagnosis: Pulmonary hypertension ([place])  Patient's Home Medications on Admission:   Current Outpatient Prescriptions:  .  albuterol (PROVENTIL HFA;VENTOLIN HFA) 108 (90 BASE) MCG/ACT inhaler, Inhale 2 puffs into the lungs every 6 (six) hours as needed. For shortness of breath., Disp: 1 Inhaler, Rfl: 3 .  atorvastatin (LIPITOR) 10 MG tablet, Take 10 mg by mouth daily., Disp: , Rfl:  .  cholecalciferol (VITAMIN D) 1000 units tablet, Take 2,000 Units by mouth 2 (two) times a week., Disp: , Rfl:  .  denosumab (PROLIA) 60 MG/ML SOLN injection, Inject 60 mg into the skin every 6 (six) months. Administer in upper arm, thigh, or abdomen, Disp: , Rfl:  .  DULoxetine (CYMBALTA) 60 MG capsule, Take 60 mg by mouth every evening. , Disp: , Rfl:  .  furosemide (LASIX) 40 MG tablet, Take 40 mg by mouth 3 (three) times a week. M-W-F, Disp: , Rfl:  .  gabapentin (NEURONTIN) 400 MG capsule, Take 400 mg by mouth 4 (four) times daily., Disp: , Rfl:  .  hyoscyamine (LEVSIN SL) 0.125 MG SL tablet, DISSOLVE 1 TABLET(0.125 MG) UNDER THE TONGUE EVERY 6 HOURS AS NEEDED, Disp: 15 tablet, Rfl: 0 .  hyoscyamine (LEVSIN SL) 0.125 MG SL tablet, DISSOLVE 1 TABLET(0.125 MG) UNDER THE TONGUE EVERY 6 HOURS AS NEEDED, Disp: 15 tablet, Rfl: 0 .  hyoscyamine (LEVSIN SL) 0.125 MG SL tablet, DISSOLVE 1 TABLET(0.125 MG) UNDER THE TONGUE EVERY 6 HOURS AS NEEDED, Disp: 15 tablet, Rfl: 0 .  lidocaine (LIDODERM) 5 %, Place 1 patch onto the skin daily. Remove & Discard patch within 12 hours or  as directed by MD, Disp: , Rfl:  .  mometasone-formoterol (DULERA) 100-5 MCG/ACT AERO, Inhale 2 puffs into the lungs 2 (two) times daily., Disp: 1 Inhaler, Rfl: 3 .  Multiple Vitamin (MULTIVITAMIN WITH MINERALS) TABS, Take 1 tablet by mouth daily., Disp: , Rfl:  .  mycophenolate (CELLCEPT) 500 MG tablet, Take 1,000 mg by mouth 2 (two) times daily. , Disp: , Rfl:  .  oxycodone (OXY-IR) 5 MG capsule, Take 5 mg by mouth every 6 (six) hours as needed., Disp: , Rfl:  .  OXYGEN, Inhale into the lungs. Continuous, 4 L/min when exercising and 6 L/min when sitting, Disp: , Rfl:  .  pantoprazole (PROTONIX) 40 MG tablet, Take 40 mg by mouth daily. , Disp: , Rfl:  .  Polyethyl Glycol-Propyl Glycol 0.4-0.3 % SOLN, Apply 1 drop to eye at bedtime as needed (dry). , Disp: , Rfl:  .  potassium chloride SA (K-DUR,KLOR-CON) 20 MEQ tablet, Take 20 mEq by mouth 3 (three) times a week. M-W-F, Disp: , Rfl:  .  predniSONE (DELTASONE) 10 MG tablet, Take two tabs daily for 5 days, then 1 tab daily for 5 days (Patient taking differently: Take 5 mg by mouth daily. Take two tabs daily for 5 days, then 1 tab daily for 5 days), Disp: 15 tablet, Rfl: 0 .  Probiotic Product (ACIDOPHILUS SUPER PROBIOTIC PO), Take by mouth 2 (two) times  daily., Disp: , Rfl:  .  sodium chloride HYPERTONIC 3 % nebulizer solution, Take 3 mLs by nebulization daily., Disp: , Rfl:  .  tadalafil, PAH, (ADCIRCA) 20 MG tablet, Take 40 mg by mouth at bedtime., Disp: , Rfl:  .  Treprostinil (TYVASO) 0.6 MG/ML SOLN, Inhale 18 mcg into the lungs 4 (four) times daily. 1.23m/2.9mL   (12breaths), Disp: , Rfl:  .  vitamin B-12 (CYANOCOBALAMIN) 1000 MCG tablet, Take 1,000 mcg by mouth 2 (two) times a week. Tues and Thurs, Disp: , Rfl:   Past Medical History: Past Medical History:  Diagnosis Date  . Anemia   . C. difficile colitis   . CAD (coronary artery disease)   . Cataract   . CHF (congestive heart failure) (H[facility])    right sided heart failure from pulmonary  hypertension  . COPD (chronic obstructive pulmonary disease) ([facility]   . Gallstones   . Hearing loss    right  . Hyperactive airway disease   . Hyperlipidemia   . Hypertension   . [place]  [place]  . Osteopenia   . Pneumonia   . Raynaud's disease   . Restrictive lung disease   . Shingles   . Tubulovillous adenoma of rectum 2007    Tobacco Use: History  Smoking Status  . Never Smoker  Smokeless Tobacco  . Never Used    Labs: Recent Review Flowsheet Data    Labs for ITP Cardiac and Pulmonary Rehab Latest Ref Rng & Units 03/01/2011 04/05/2016 04/05/2016 04/05/2016 04/06/2016   Cholestrol 0 - 200 mg/dL 211(H) - - - -   LDLDIRECT mg/dL 143.1 - - - -   HDL >39.00 mg/dL 44.00 - - - -   Trlycerides 0.0 - 149.0 mg/dL 105.0 - - - -   Hemoglobin A1c 4.8 - 5.6 % - - - - 5.5   HCO3 20.0 - 28.0 mmol/L - 31.9(H) - 33.6(H) -   TCO2 0 - 100 mmol/L - 34 30 35 -   O2SAT % - 50.0 - 98.0 -      Capillary Blood Glucose: Lab Results  Component Value Date   GLUCAP 202 (H) 04/06/2016   GLUCAP 157 (H) 04/06/2016   GLUCAP 152 (H) 04/06/2016   GLUCAP 147 (H) 04/05/2016     ADL UCSD:     Pulmonary Assessment Scores    Row Name 02/25/16 1121         ADL UCSD   ADL Phase Entry     SOB Score total 67        Pulmonary Function Assessment:     Pulmonary Function Assessment - 02/06/16 1502      Breath   Bilateral Breath Sounds Other   Other fine crackles bases bilat right greater than left   Shortness of Breath Fear of Shortness of Breath;Limiting activity      Exercise Target Goals:    Exercise Program Goal: Individual exercise prescription set with THRR, safety & activity barriers. Participant demonstrates ability to understand and report RPE using BORG scale, to self-measure pulse accurately, and to acknowledge the importance of the exercise prescription.  Exercise Prescription Goal: Starting with aerobic activity 30 plus minutes a day, 3 days per week  for initial exercise prescription. Provide home exercise prescription and guidelines that participant acknowledges understanding prior to discharge.  Activity Barriers & Risk Stratification:     Activity Barriers & Cardiac Risk Stratification - 02/06/16 1500      Activity Barriers & Cardiac Risk  Stratification   Activity Barriers Deconditioning;Muscular Weakness;Shortness of Breath      6 Minute Walk:     6 Minute Walk    Row Name 02/13/16 0744 02/26/16 1605       6 Minute Walk   Phase Initial Mid Program  for doctors office concerning o2 needs    Distance 552 feet 675 feet    Walk Time -  5 minutes and 45 seconds 6 minutes    # of Rest Breaks 1  15 seconds 0    MPH 1.04 1.27    METS 1.77 1.92    RPE 14 16    Perceived Dyspnea  4 3    Symptoms No No    Resting HR 124 bpm 124 bpm    Resting BP 140/70 150/66    Max Ex. HR 142 bpm 139 bpm    Max Ex. BP 160/60 210/80    2 Minute Post BP 124/64 150/50      Interval HR   Baseline HR 124 124    1 Minute HR 124 128    2 Minute HR 128 130    3 Minute HR 132 132    4 Minute HR 135 139    5 Minute HR 139 139    6 Minute HR 142 136    2 Minute Post HR 122 128    Interval Heart Rate? Yes Yes      Interval Oxygen   Interval Oxygen? Yes Yes    Baseline Oxygen Saturation % 94 % 94 %    Baseline Liters of Oxygen 4 L 4 L    1 Minute Oxygen Saturation % 92 % 92 %    1 Minute Liters of Oxygen 4 L 4 L    2 Minute Oxygen Saturation % 89 % 90 %    2 Minute Liters of Oxygen 4 L 4 L    3 Minute Oxygen Saturation % 89 % 89 %    3 Minute Liters of Oxygen 4 L 4 L    4 Minute Oxygen Saturation % 88 % 88 %    4 Minute Liters of Oxygen 4 L 4 L    5 Minute Oxygen Saturation % 87 % 90 %    5 Minute Liters of Oxygen 4 L 6 L    6 Minute Oxygen Saturation % 89 % 91 %    6 Minute Liters of Oxygen 4 L 6 L    2 Minute Post Oxygen Saturation % 100 % 94 %    2 Minute Post Liters of Oxygen 4 L 6 L       Oxygen Initial  Assessment:   Oxygen Re-Evaluation:     Oxygen Re-Evaluation    Row Name 06/01/16 0955 06/29/16 1630           Program Oxygen Prescription   Program Oxygen Prescription Continuous Continuous      Liters per minute 6 6        Home Oxygen   Home Oxygen Device E-Tanks Home Concentrator;E-Tanks      Sleep Oxygen Prescription Continuous Continuous      Liters per minute 4 4      Home Exercise Oxygen Prescription Continuous Continuous      Liters per minute 6 6      Home at Rest Exercise Oxygen Prescription Continuous Continuous      Liters per minute 4 4      Compliance with Home Oxygen Use Yes Yes  Goals/Expected Outcomes   Short Term Goals To learn and exhibit compliance with exercise, home and travel O2 prescription To learn and exhibit compliance with exercise, home and travel O2 prescription      Long  Term Goals Exhibits compliance with exercise, home and travel O2 prescription Exhibits compliance with exercise, home and travel O2 prescription      Comments  - Compliant      Goals/Expected Outcomes  - Continue to be complian         Oxygen Discharge (Final Oxygen Re-Evaluation):     Oxygen Re-Evaluation - 06/29/16 1630      Program Oxygen Prescription   Program Oxygen Prescription Continuous   Liters per minute 6     Home Oxygen   Home Oxygen Device Home Concentrator;E-Tanks   Sleep Oxygen Prescription Continuous   Liters per minute 4   Home Exercise Oxygen Prescription Continuous   Liters per minute 6   Home at Rest Exercise Oxygen Prescription Continuous   Liters per minute 4   Compliance with Home Oxygen Use Yes     Goals/Expected Outcomes   Short Term Goals To learn and exhibit compliance with exercise, home and travel O2 prescription   Long  Term Goals Exhibits compliance with exercise, home and travel O2 prescription   Comments Compliant   Goals/Expected Outcomes Continue to be complian      Initial Exercise Prescription:     Initial  Exercise Prescription - 02/13/16 0700      Date of Initial Exercise RX and Referring Provider   Date 02/13/16   Referring Provider Dr. Nelda Marseille     Oxygen   Oxygen Continuous   Liters 4     Bike   Level 0.3   Minutes 17     NuStep   Level 1   Minutes 17   METs 1.5     Track   Laps 5   Minutes 17     Prescription Details   Frequency (times per week) 2   Duration Progress to 45 minutes of aerobic exercise without signs/symptoms of physical distress     Intensity   THRR 40-80% of Max Heartrate 63-126   Ratings of Perceived Exertion 11-13   Perceived Dyspnea 0-4     Progression   Progression Continue progressive overload as per policy without signs/symptoms or physical distress.     Resistance Training   Training Prescription Yes   Weight orange bands   Reps 10-12      Perform Capillary Blood Glucose checks as needed.  Exercise Prescription Changes:     Exercise Prescription Changes    Row Name 02/19/16 1500 02/24/16 1500 02/26/16 1600 03/02/16 1500 03/04/16 1600     Response to Exercise   Blood Pressure (Admit) 140/60 102/60 150/66 92/50 110/40   Blood Pressure (Exercise) 132/60 160/80 210/80  she will add her lasix back in her medicines,  126/60  -   Blood Pressure (Exit) 142/70 126/62 150/50 106/60 120/64   Heart Rate (Admit) 121 bpm 120 bpm 124 bpm 119 bpm 118 bpm   Heart Rate (Exercise) 122 bpm 130 bpm 139 bpm 132 bpm 122 bpm   Heart Rate (Exit) 116 bpm 115 bpm 128 bpm 114 bpm 111 bpm   Oxygen Saturation (Admit) 92 % 99 % 94 % 93 % 90 %   Oxygen Saturation (Exercise) 9592 % 89 % 88 % 89 % 96 %   Oxygen Saturation (Exit) 96 % 96 % 94 % 97 % 100 %  Rating of Perceived Exertion (Exercise)  - _0 Perceived Dyspnea (Exercise)  - _1 Duration Progress to 45 minutes of aerobic exercise without signs/symptoms of physical distress Progress to 45 minutes of aerobic exercise without signs/symptoms of physical distress Progress to 45 minutes of  aerobic exercise without signs/symptoms of physical distress Progress to 45 minutes of aerobic exercise without signs/symptoms of physical distress Progress to 45 minutes of aerobic exercise without signs/symptoms of physical distress   Intensity -  40-80% HRR THRR unchanged THRR unchanged THRR unchanged THRR unchanged     Progression   Progression Continue to progress workloads to maintain intensity without signs/symptoms of physical distress. Continue to progress workloads to maintain intensity without signs/symptoms of physical distress. Continue to progress workloads to maintain intensity without signs/symptoms of physical distress. Continue to progress workloads to maintain intensity without signs/symptoms of physical distress. Continue to progress workloads to maintain intensity without signs/symptoms of physical distress.     Resistance Training   Training Prescription _2    Weight _3    Reps 10-12  10 minutes of strength training 10-12  10 minutes of strength training 10-12  10 minutes of strength training 10-12  10 minutes of strength training 10-12  10 minutes of strength training     Interval Training   Interval Training _4      Oxygen   Oxygen  - Continuous Continuous Continuous Continuous   Liters  - _5 Recumbant Bike   Level 1 1  - 1 1   Minutes 17 17  - 17 17     NuStep   Level 1 1  - 1  -   Minutes 17 17  - 17  -   METs  - 1.4  -  -  -     Track   Laps  - 5 5  performed another 6 minute walk test during class 8 7   Minutes  - _6 Row Name 03/09/16 1515 03/16/16 1500 03/18/16 1600 03/23/16 1514 03/30/16 1500     Response to Exercise   Blood Pressure (Admit) 100/40 100/50 98/60 104/50 114/70   Blood Pressure (Exercise) 100/40 110/50 134/62 120/60 120/56   Blood Pressure (Exit) 126/72 122/70 110/62 112/62 120/60   Heart Rate (Admit) 119 bpm 116 bpm 115 bpm  120 bpm 120 bpm   Heart Rate (Exercise) 130 bpm 124 bpm 116 bpm 126 bpm 129 bpm   Heart Rate (Exit) 109 bpm 110 bpm 100 bpm 114 bpm 114 bpm   Oxygen Saturation (Admit) 95 % 99 % 95 % 94 % 99 %   Oxygen Saturation (Exercise) 91 % 93 % 90 % 94 % 91 %   Oxygen Saturation (Exit) 98 % 99 % 93 % 100 % 100 %   Rating of Perceived Exertion (Exercise) _7 Perceived Dyspnea (Exercise) _8 Duration Progress to 45 minutes of aerobic exercise without signs/symptoms of physical distress Progress to 45 minutes of aerobic exercise without signs/symptoms of physical distress Progress to 45 minutes of aerobic exercise without signs/symptoms of physical distress Progress to 45 minutes of aerobic exercise without signs/symptoms of physical distress Progress to 45 minutes of aerobic exercise without signs/symptoms of physical distress   Intensity THRR unchanged THRR unchanged  THRR unchanged THRR unchanged THRR unchanged     Progression   Progression Continue to progress workloads to maintain intensity without signs/symptoms of physical distress. Continue to progress workloads to maintain intensity without signs/symptoms of physical distress. Continue to progress workloads to maintain intensity without signs/symptoms of physical distress. Continue to progress workloads to maintain intensity without signs/symptoms of physical distress. Continue to progress workloads to maintain intensity without signs/symptoms of physical distress.     Resistance Training   Training Prescription _0    Weight _1    Reps 10-12  10 minutes of strength training 10-12  10 minutes of strength training 10-12  10 minutes of strength training 10-12  10 minutes of strength training 10-12  10 minutes of strength training     Interval Training   Interval Training _2      Oxygen   Oxygen Continuous Continuous Continuous Continuous  Continuous   Liters _3 Recumbant Bike   Level _4 Minutes _5 NuStep   Level _6 Minutes _7 METs  -  -  - 1.6 1.6     Track   Laps 7 8  - 5 4   Minutes 17 17  - 17 17     Exercise Review   Progression  -  - Yes  - Yes   Row Name 05/04/16 1500 06/03/16 1500 06/08/16 1523 06/10/16 1642 06/15/16 1541     Response to Exercise   Blood Pressure (Admit) 138/50 132/64 130/78 120/60 124/60   Blood Pressure (Exercise) 138/64 170/76 130/70 144/70 144/68   Blood Pressure (Exit) 96/62 140/70 136/70 144/82 130/60   Heart Rate (Admit) 126 bpm 108 bpm 113 bpm 115 bpm 113 bpm   Heart Rate (Exercise) 127 bpm 121 bpm 122 bpm 126 bpm 128 bpm   Heart Rate (Exit) 119 bpm 103 bpm 109 bpm 106 bpm 112 bpm   Oxygen Saturation (Admit) 98 % 97 % 91 % 94 % 96 %   Oxygen Saturation (Exercise) 97 % 94 % 92 % 92 % 96 %   Oxygen Saturation (Exit) 100 % 99 % 98 % 97 % 96 %   Rating of Perceived Exertion (Exercise) _8 Perceived Dyspnea (Exercise) 2 1.5 1._9 Duration Progress to 45 minutes of aerobic exercise without signs/symptoms of physical distress Progress to 45 minutes of aerobic exercise without signs/symptoms of physical distress Progress to 45 minutes of aerobic exercise without signs/symptoms of physical distress Progress to 45 minutes of aerobic exercise without signs/symptoms of physical distress Progress to 45 minutes of aerobic exercise without signs/symptoms of physical distress   Intensity _10      Progression   Progression Continue to progress workloads to maintain intensity without signs/symptoms of physical distress. Continue to progress workloads to maintain intensity without signs/symptoms of physical distress. Continue to progress workloads to maintain intensity without signs/symptoms of physical distress. Continue to progress workloads to maintain  intensity without signs/symptoms of physical distress. Continue to progress workloads to maintain intensity without signs/symptoms of physical distress.     Resistance Training   Training Prescription _11    Weight orange bands orange  bands orange bands orange bands orange bands   Reps 10-15 10-15 10-15 10-15 10-15   Time 10 Minutes 10 Minutes 10 Minutes 10 Minutes 10 Minutes     Interval Training   Interval Training _0      Oxygen   Oxygen _1    Liters _2 Recumbant Bike   Level 2 1  Pt. decreased her workload due to her recent absences. 2  - 1.5   Minutes _3 - 17     NuStep   Level 2  - _4 Minutes 17  - _5 METs 1.4  - 1.5 1.6 1.7     Track   Laps _6 Minutes _7 Exercise Review   Progression  -  -  -  - Yes   Row Name 06/17/16 1600 06/24/16 1600 06/29/16 1500         Response to Exercise   Blood Pressure (Admit) 106/50 128/64 120/60     Blood Pressure (Exercise) 132/80 136/70 166/80     Blood Pressure (Exit) 138/80 110/60 132/68     Heart Rate (Admit) 118 bpm 115 bpm 114 bpm     Heart Rate (Exercise) 131 bpm 125 bpm 126 bpm     Heart Rate (Exit) 115 bpm 108 bpm 109 bpm     Oxygen Saturation (Admit) 92 % 96 % 95 %     Oxygen Saturation (Exercise) 91 % 93 % 92 %     Oxygen Saturation (Exit) 97 % 98 % 98 %     Rating of Perceived Exertion (Exercise) _8 Perceived Dyspnea (Exercise) 1._9 Duration Progress to 45 minutes of aerobic exercise without signs/symptoms of physical distress Progress to 45 minutes of aerobic exercise without signs/symptoms of physical distress Progress to 45 minutes of aerobic exercise without signs/symptoms of physical distress     Intensity THRR unchanged THRR unchanged THRR unchanged       Progression   Progression Continue to progress workloads to maintain intensity without signs/symptoms of  physical distress. Continue to progress workloads to maintain intensity without signs/symptoms of physical distress. Continue to progress workloads to maintain intensity without signs/symptoms of physical distress.       Resistance Training   Training Prescription Yes Yes Yes     Weight orange bands orange bands orange bands     Reps 10-15 10-15 10-15     Time 10 Minutes 10 Minutes 10 Minutes       Interval Training   Interval Training No No No       Oxygen   Oxygen Continuous Continuous Continuous     Liters 6 6 4-6       Recumbant Bike   Level  -  - 2     Minutes  -  - 17       NuStep   Level _10 Minutes _11 METs 1.7 1.5  -       Track   Laps _12 Minutes _13 Exercise Review   Progression  -  - Yes        Exercise Comments:  Exercise Comments    Row Name 03/16/16 5003 04/05/16 1612         Exercise Comments Patient has only attended 6 exercise sessions. She is up to 8 laps on the track and her MET level places her at a low level. Will cont. to motivate and encourage patient to increase her intensities.  Patient has been hospitalized. Will cont. to monitor and progress when patient returns.          Exercise Goals and Review:   Exercise Goals Re-Evaluation :     Exercise Goals Re-Evaluation    Row Name 05/03/16 1154 06/01/16 0739 06/28/16 1416         Exercise Goal Re-Evaluation   Exercise Goals Review Increase Physical Activity;Increase Strenth and Stamina Increase Physical Activity;Increase Strenth and Stamina Increase Physical Activity;Increase Strenth and Stamina     Comments Patient was hospitalized and has been out of rehab since 03/30/16. Patient will be back in rehab this week. Will cont. to monitor and progress. Patient has been out of rehab since 05/04/16 due to infection. Patient will be back in rehab this week. Will cont. to monitor and progress.  Patient has returned to rehab since hospitalization. Progressing  slowly. Up to 8 laps (200 feet each) in 15 minutes. Will cont. to monitor and progress.      Expected Outcomes Patient will cont. to increase endurance and stamina through the exercises here at rehab. She will also practice her breathing techinques to become less short of breath on exertion.  Patient will cont. to increase endurance and stamina through the exercises here at rehab. She will also practice her breathing techinques to become less short of breath on exertion.  Through exercising at rehab and at home the patient will increase strength and stamina which will make ADL's easier to preform.        Discharge Exercise Prescription (Final Exercise Prescription Changes):     Exercise Prescription Changes - 06/29/16 1500      Response to Exercise   Blood Pressure (Admit) 120/60   Blood Pressure (Exercise) 166/80   Blood Pressure (Exit) 132/68   Heart Rate (Admit) 114 bpm   Heart Rate (Exercise) 126 bpm   Heart Rate (Exit) 109 bpm   Oxygen Saturation (Admit) 95 %   Oxygen Saturation (Exercise) 92 %   Oxygen Saturation (Exit) 98 %   Rating of Perceived Exertion (Exercise) 13   Perceived Dyspnea (Exercise) 2   Duration Progress to 45 minutes of aerobic exercise without signs/symptoms of physical distress   Intensity THRR unchanged     Progression   Progression Continue to progress workloads to maintain intensity without signs/symptoms of physical distress.     Resistance Training   Training Prescription Yes   Weight orange bands   Reps 10-15   Time 10 Minutes     Interval Training   Interval Training No     Oxygen   Oxygen Continuous   Liters 4-6     Recumbant Bike   Level 2   Minutes 17     NuStep   Level 2   Minutes 17     Track   Laps 7   Minutes 17     Exercise Review   Progression Yes      Nutrition:  Target Goals: Understanding of nutrition guidelines, daily intake of sodium <1513m, cholesterol <2071m calories 30% from fat and 7% or less from  saturated fats, daily to have 5 or more servings of fruits and  vegetables.  Biometrics:     Pre Biometrics - 02/19/16 1554      Pre Biometrics   Grip Strength 22 kg       Nutrition Therapy Plan and Nutrition Goals:     Nutrition Therapy & Goals - 03/30/16 1447      Nutrition Therapy   Diet High Calorie, High Protein     Personal Nutrition Goals   Nutrition Goal Prevent further wt loss/promote wt gain     Intervention Plan   Intervention Prescribe, educate and counsel regarding individualized specific dietary modifications aiming towards targeted core components such as weight, hypertension, lipid management, diabetes, heart failure and other comorbidities.   Expected Outcomes Short Term Goal: Understand basic principles of dietary content, such as calories, fat, sodium, cholesterol and nutrients.;Long Term Goal: Adherence to prescribed nutrition plan.      Nutrition Discharge: Rate Your Plate Scores:     Nutrition Assessments - 03/30/16 1447      Rate Your Plate Scores   Pre Score 53      Nutrition Goals Re-Evaluation:   Nutrition Goals Discharge (Final Nutrition Goals Re-Evaluation):   Psychosocial: Target Goals: Acknowledge presence or absence of significant depression and/or stress, maximize coping skills, provide positive support system. Participant is able to verbalize types and ability to use techniques and skills needed for reducing stress and depression.  Initial Review & Psychosocial Screening:     Initial Psych Review & Screening - 02/06/16 [place]? Yes     Barriers   Psychosocial barriers to participate in program The patient should benefit from training in stress management and relaxation.     Screening Interventions   Interventions Encouraged to exercise      Quality of Life Scores:     Quality of Life - 02/25/16 1121      Quality of Life Scores   Health/Function Pre 9.17 %   Socioeconomic Pre  20.93 %   Psych/Spiritual Pre 14.57 %   Family Pre 10.5 %   GLOBAL Pre 13.05 %      PHQ-9: Recent Review Flowsheet Data    Depression screen [facility] 2/9 02/06/2016 07/09/2014 01/21/2014   Decreased Interest 1 1 0   Down, Depressed, Hopeless 0 0 0   PHQ - 2 Score 1 1 0     Interpretation of Total Score  Total Score Depression Severity:  1-4 = Minimal depression, 5-9 = Mild depression, 10-14 = Moderate depression, 15-19 = Moderately severe depression, 20-27 = Severe depression   Psychosocial Evaluation and Intervention:     Psychosocial Evaluation - 02/06/16 1504      Psychosocial Evaluation & Interventions   Interventions Encouraged to exercise with the program and follow exercise prescription      Psychosocial Re-Evaluation:     Psychosocial Re-Evaluation    Row Name 02/19/16 1633 03/15/16 1458 04/06/16 0907 04/29/16 0836 06/01/16 0958     Psychosocial Re-Evaluation   Current issues with  -  -  - None Identified None Identified   Comments no psychosocial issue identified no psychosocial issues identified good support system, no psychosocial issues identified at this time  -  -   Interventions Encouraged to attend Pulmonary Rehabilitation for the exercise Encouraged to attend Pulmonary Rehabilitation for the exercise Encouraged to attend Pulmonary Rehabilitation for the exercise Encouraged to attend Pulmonary Rehabilitation for the exercise Encouraged to attend Pulmonary Rehabilitation for the exercise   Continue Psychosocial Services  No No  No No Follow up required No Follow up required   [place] Name 06/29/16 1633             Psychosocial Re-Evaluation   Current issues with None Identified       Interventions Encouraged to attend Pulmonary Rehabilitation for the exercise       Continue Psychosocial Services  No Follow up required          Psychosocial Discharge (Final Psychosocial Re-Evaluation):     Psychosocial Re-Evaluation - 06/29/16 1633      Psychosocial  Re-Evaluation   Current issues with None Identified   Interventions Encouraged to attend Pulmonary Rehabilitation for the exercise   Continue Psychosocial Services  No Follow up required      Education: Education Goals: Education classes will be provided on a weekly basis, covering required topics. Participant will state understanding/return demonstration of topics presented.  Learning Barriers/Preferences:     Learning Barriers/Preferences - 02/06/16 1500      Learning Barriers/Preferences   Learning Barriers None   Learning Preferences Computer/Internet;Group Instruction;Verbal Instruction;Written Material      Education Topics: Risk Factor Reduction:  -Group instruction that is supported by a PowerPoint presentation. Instructor discusses the definition of a risk factor, different risk factors for pulmonary disease, and how the heart and lungs work together.     Nutrition for Pulmonary Patient:  -Group instruction provided by PowerPoint slides, verbal discussion, and written materials to support subject matter. The instructor gives an explanation and review of healthy diet recommendations, which includes a discussion on weight management, recommendations for fruit and vegetable consumption, as well as protein, fluid, caffeine, fiber, sodium, sugar, and alcohol. Tips for eating when patients are short of breath are discussed.   PULMONARY REHAB OTHER RESPIRATORY from 06/24/2016 in [place]  Date  03/04/16  Educator  [place]  Instruction Review Code  2- meets goals/outcomes      Pursed Lip Breathing:  -Group instruction that is supported by demonstration and informational handouts. Instructor discusses the benefits of pursed lip and diaphragmatic breathing and detailed demonstration on how to preform both.     Oxygen Safety:  -Group instruction provided by PowerPoint, verbal discussion, and written material to support subject matter. There is an  overview of "What is Oxygen" and "Why do we need it".  Instructor also reviews how to create a safe environment for oxygen use, the importance of using oxygen as prescribed, and the risks of noncompliance. There is a brief discussion on traveling with oxygen and resources the patient may utilize.   Oxygen Equipment:  -Group instruction provided by [facility] Staff utilizing handouts, written materials, and equipment demonstrations.   PULMONARY REHAB OTHER RESPIRATORY from 06/24/2016 in [place]  Date  02/26/16  Educator  [employer]  Instruction Review Code  2- meets goals/outcomes      Signs and Symptoms:  -Group instruction provided by written material and verbal discussion to support subject matter. Warning signs and symptoms of infection, stroke, and heart attack are reviewed and when to call the physician/911 reinforced. Tips for preventing the spread of infection discussed.   PULMONARY REHAB OTHER RESPIRATORY from 06/24/2016 in [place]  Date  06/24/16  Educator  RN  Instruction Review Code  2- meets goals/outcomes      Advanced Directives:  -Group instruction provided by verbal instruction and written material to support subject matter. Instructor reviews Advanced Directive laws and proper instruction for  filling out document.   Pulmonary Video:  -Group video education that reviews the importance of medication and oxygen compliance, exercise, good nutrition, pulmonary hygiene, and pursed lip and diaphragmatic breathing for the pulmonary patient.   PULMONARY REHAB OTHER RESPIRATORY from 06/24/2016 in [place]  Date  06/03/16  Educator  video  Instruction Review Code  2- meets goals/outcomes      Exercise for the Pulmonary Patient:  -Group instruction that is supported by a PowerPoint presentation. Instructor discusses benefits of exercise, core components of exercise, frequency,  duration, and intensity of an exercise routine, importance of utilizing pulse oximetry during exercise, safety while exercising, and options of places to exercise outside of rehab.     PULMONARY REHAB OTHER RESPIRATORY from 06/24/2016 in [place]  Date  06/10/16  Educator  ep  Instruction Review Code  2- meets goals/outcomes      Pulmonary Medications:  -Verbally interactive group education provided by instructor with focus on inhaled medications and proper administration.   PULMONARY REHAB OTHER RESPIRATORY from 06/24/2016 in [place]  Date  03/18/16  Educator  Pharm  Instruction Review Code  2- meets goals/outcomes      Anatomy and Physiology of the Respiratory System and Intimacy:  -Group instruction provided by PowerPoint, verbal discussion, and written material to support subject matter. Instructor reviews respiratory cycle and anatomical components of the respiratory system and their functions. Instructor also reviews differences in obstructive and restrictive respiratory diseases with examples of each. Intimacy, Sex, and Sexuality differences are reviewed with a discussion on how relationships can change when diagnosed with pulmonary disease. Common sexual concerns are reviewed.   Knowledge Questionnaire Score:     Knowledge Questionnaire Score - 02/25/16 1120      Knowledge Questionnaire Score   Pre Score 13/13      Core Components/Risk Factors/Patient Goals at Admission:     Personal Goals and Risk Factors at Admission - 02/19/16 1631      Core Components/Risk Factors/Patient Goals on Admission    Weight Management Weight Maintenance      Core Components/Risk Factors/Patient Goals Review:      Goals and Risk Factor Review    Row Name 02/19/16 1632 03/15/16 1456 04/06/16 0906 04/29/16 0834 06/01/16 0957     Core Components/Risk Factors/Patient Goals Review   Personal Goals Review Increase Strength  and Stamina;Improve shortness of breath with ADL's;Weight Management/Obesity Increase Strength and Stamina;Improve shortness of breath with ADL's;Weight Management/Obesity  weight gain, not weight loss Increase Strength and Stamina;Improve shortness of breath with ADL's;Weight Management/Obesity Improve shortness of breath with ADL's;Weight Management/Obesity Weight Management/Obesity;Improve shortness of breath with ADL's;Develop more efficient breathing techniques such as purse lipped breathing and diaphragmatic breathing and practicing self-pacing with activity.   Review Just started program today, work on [employer] management, too early to see any progression. 6 sessions completed, looks much better than at start of program, adjusting well Presently hospitalized for respiratory failure and atelectasis Released from hospital 2 weeks ago, is working with home PT, in hopes of returning yo pulmonary rehab next week developed c-diff, has completed antibiotic treatment, will return to the program this week   Expected Outcomes  - continue to progress with workload increases Continue to work on goals when she is able to return to program continue to work on goals has been absent 1 month, will be starting all over, progress as tolerated   [place] Name 06/29/16 1631  Core Components/Risk Factors/Patient Goals Review   Personal Goals Review Weight Management/Obesity;Develop more efficient breathing techniques such as purse lipped breathing and diaphragmatic breathing and practicing self-pacing with activity.;Improve shortness of breath with ADL's       Review over c-diff, is back at baseline       Expected Outcomes continue to progress as tolerated.          Core Components/Risk Factors/Patient Goals at Discharge (Final Review):      Goals and Risk Factor Review - 06/29/16 1631      Core Components/Risk Factors/Patient Goals Review   Personal Goals Review Weight Management/Obesity;Develop more  efficient breathing techniques such as purse lipped breathing and diaphragmatic breathing and practicing self-pacing with activity.;Improve shortness of breath with ADL's   Review over c-diff, is back at baseline   Expected Outcomes continue to progress as tolerated.      ITP Comments:   Comments: ITP REVIEW Pt is making expected progress toward pulmonary rehab goals after completing 18 sessions. Recommend continued exercise, life style modification, education, and utilization of breathing techniques to increase stamina and strength and decrease shortness of breath with exertion.

## 2016-06-29 NOTE — Progress Notes (Signed)
Daily Session Note  Patient Details  Name: TAKESHA STEGER MRN: [id number] Date of Birth: Oct 30, 1950 Referring Provider:     Pulmonary Rehab Walk Test from 02/12/2016 in [place]  Referring Provider  Dr. Nelda Marseille      Encounter Date: 06/29/2016  Check In:     Session Check In - 06/29/16 1329      Check-In   Location [facility]   Staff Present Trish Fountain, RN, Maxcine Ham, RN, BSN;Molly diVincenzo, MS, ACSM RCEP, Exercise Physiologist;Willona Phariss Ysidro Evert, RN   Supervising physician immediately available to respond to emergencies [facility] immediately available   Physician(s) Dr. Almetta Lovely   Medication changes reported     No   Fall or balance concerns reported    No   Tobacco Cessation No Change   Warm-up and Cool-down Performed as group-led instruction   Resistance Training Performed Yes   VAD Patient? No     Pain Assessment   Currently in Pain? No/denies   Multiple Pain Sites No      Capillary Blood Glucose: No results found. However, due to the size of the patient record, not all encounters were searched. Please check Results Review for a complete set of results.      Exercise Prescription Changes - 06/29/16 1500      Response to Exercise   Blood Pressure (Admit) 120/60   Blood Pressure (Exercise) 166/80   Blood Pressure (Exit) 132/68   Heart Rate (Admit) 114 bpm   Heart Rate (Exercise) 126 bpm   Heart Rate (Exit) 109 bpm   Oxygen Saturation (Admit) 95 %   Oxygen Saturation (Exercise) 92 %   Oxygen Saturation (Exit) 98 %   Rating of Perceived Exertion (Exercise) 13   Perceived Dyspnea (Exercise) 2   Duration Progress to 45 minutes of aerobic exercise without signs/symptoms of physical distress   Intensity THRR unchanged     Progression   Progression Continue to progress workloads to maintain intensity without signs/symptoms of physical distress.     Resistance Training   Training Prescription Yes   Weight orange bands   Reps 10-15   Time 10 Minutes     Interval Training   Interval Training No     Oxygen   Oxygen Continuous   Liters 4-6     Recumbant Bike   Level 2   Minutes 17     NuStep   Level 2   Minutes 17     Track   Laps 7   Minutes 17     Exercise Review   Progression Yes      History  Smoking Status  . Never Smoker  Smokeless Tobacco  . Never Used    Goals Met:  Exercise tolerated well No report of cardiac concerns or symptoms Strength training completed today  Goals Unmet:  Not Applicable  Comments: Service time is from 1330 to 1500   Dr. Rush Farmer is Medical Director for Pulmonary Rehab at [facility].

## 2016-07-01 ENCOUNTER — Encounter (HOSPITAL_COMMUNITY)
Admission: RE | Admit: 2016-07-01 | Discharge: 2016-07-01 | Disposition: A | Discharge status: Home / Self Care | Payer: BLUE CROSS/BLUE SHIELD | Source: Ambulatory Visit | Attending: Internal Medicine | Admitting: Internal Medicine

## 2016-07-01 VITALS — Wt 123.2 lb

## 2016-07-01 DIAGNOSIS — I272 Pulmonary hypertension, unspecified: Secondary | ICD-10-CM

## 2016-07-01 DIAGNOSIS — Z5189 Encounter for other specified aftercare: Secondary | ICD-10-CM | POA: Diagnosis not present

## 2016-07-01 NOTE — Progress Notes (Signed)
Daily Session Note  Patient Details  Name: Deborah [name] MRN: [id number] Date of Birth: 04-Feb-1951 Referring Provider:     Pulmonary Rehab Walk Test from 02/12/2016 in [place]  Referring Provider  Dr. Nelda Marseille      Encounter Date: 07/01/2016  Check In:     Session Check In - 07/01/16 1417      Check-In   Location [facility]   Staff Present Rosebud Poles, RN, BSN;Lisa Ysidro Evert, RN;Virgilio Broadhead Rollene Rotunda, RN, BSN   Supervising physician immediately available to respond to emergencies [facility] immediately available   Physician(s) Dr. Wendee Beavers   Medication changes reported     No   Fall or balance concerns reported    No   Tobacco Cessation No Change   Warm-up and Cool-down Performed as group-led instruction   Resistance Training Performed Yes   VAD Patient? No     Pain Assessment   Currently in Pain? No/denies   Multiple Pain Sites No      Capillary Blood Glucose: No results found. However, due to the size of the patient record, not all encounters were searched. Please check Results Review for a complete set of results.      Exercise Prescription Changes - 07/01/16 1614      Response to Exercise   Blood Pressure (Admit) 130/66   Blood Pressure (Exercise) 136/70   Blood Pressure (Exit) 130/60   Heart Rate (Admit) 112 bpm   Heart Rate (Exercise) 114 bpm   Heart Rate (Exit) 95 bpm   Oxygen Saturation (Admit) 94 %   Oxygen Saturation (Exercise) 95 %   Oxygen Saturation (Exit) 100 %   Rating of Perceived Exertion (Exercise) 12   Perceived Dyspnea (Exercise) 1   Duration Progress to 45 minutes of aerobic exercise without signs/symptoms of physical distress   Intensity THRR unchanged     Progression   Progression Continue to progress workloads to maintain intensity without signs/symptoms of physical distress.     Resistance Training   Training Prescription Yes   Weight orange bands   Reps 10-15   Time 10 Minutes     Interval Training   Interval Training No     Oxygen   Oxygen Continuous   Liters 4-6     Recumbant Bike   Level 2   Minutes 17     NuStep   Level 3   Minutes 17   METs 1.7     Home Exercise Plan   Plans to continue exercise at Home (comment)   Frequency Add 3 additional days to program exercise sessions.     Exercise Review   Progression Yes      History  Smoking Status  . Never Smoker  Smokeless Tobacco  . Never Used    Goals Met:  Improved SOB with ADL's Using PLB without cueing & demonstrates good technique Exercise tolerated well No report of cardiac concerns or symptoms Strength training completed today  Goals Unmet:  Not Applicable  Comments: Service time is from 1330 to 1530   Dr. Rush Farmer is Medical Director for Pulmonary Rehab at [facility].

## 2016-07-06 ENCOUNTER — Encounter (HOSPITAL_COMMUNITY)
Admission: RE | Admit: 2016-07-06 | Discharge: 2016-07-06 | Disposition: A | Discharge status: Home / Self Care | Payer: BLUE CROSS/BLUE SHIELD | Source: Ambulatory Visit | Attending: Internal Medicine | Admitting: Internal Medicine

## 2016-07-06 VITALS — Wt 121.9 lb

## 2016-07-06 DIAGNOSIS — Z5189 Encounter for other specified aftercare: Secondary | ICD-10-CM | POA: Diagnosis not present

## 2016-07-06 DIAGNOSIS — I272 Pulmonary hypertension, unspecified: Secondary | ICD-10-CM

## 2016-07-06 NOTE — Progress Notes (Signed)
Daily Session Note  Patient Details  Name: Deborah [name] MRN: [id number] Date of Birth: [birth year] Referring Provider:     Pulmonary Rehab Walk Test from 02/12/2016 in [place]  Referring Provider  Dr. Nelda Marseille      Encounter Date: 07/06/2016  Check In:     Session Check In - 07/06/16 1330      Check-In   Location [facility]   Staff Present Rosebud Poles, RN, BSN;Lisa Ysidro Evert, RN;Dolph Tavano Rollene Rotunda, RN, BSN;Molly diVincenzo, MS, ACSM RCEP, Exercise Physiologist   Supervising physician immediately available to respond to emergencies [facility] immediately available   Physician(s) Dr. Wendee Beavers   Medication changes reported     No   Fall or balance concerns reported    No   Tobacco Cessation No Change   Warm-up and Cool-down Performed as group-led instruction   Resistance Training Performed Yes   VAD Patient? No     Pain Assessment   Currently in Pain? No/denies   Multiple Pain Sites No      Capillary Blood Glucose: No results found. However, due to the size of the patient record, not all encounters were searched. Please check Results Review for a complete set of results.      Exercise Prescription Changes - 07/06/16 1527      Response to Exercise   Blood Pressure (Admit) 114/62   Blood Pressure (Exercise) 130/60   Blood Pressure (Exit) 108/58   Heart Rate (Admit) 112 bpm   Heart Rate (Exercise) 127 bpm   Heart Rate (Exit) 116 bpm   Oxygen Saturation (Admit) 98 %   Oxygen Saturation (Exercise) 93 %   Oxygen Saturation (Exit) 96 %   Rating of Perceived Exertion (Exercise) 13   Perceived Dyspnea (Exercise) 1   Duration Progress to 45 minutes of aerobic exercise without signs/symptoms of physical distress   Intensity THRR unchanged     Progression   Progression Continue to progress workloads to maintain intensity without signs/symptoms of physical distress.     Resistance Training   Training Prescription Yes   Weight orange bands   Reps 10-15   Time 10 Minutes     Interval Training   Interval Training No     Oxygen   Oxygen Continuous   Liters 4-6     Recumbant Bike   Level 2   Minutes 17     NuStep   Level 3   Minutes 17   METs 1.8     Track   Laps 6   Minutes 17     Home Exercise Plan   Plans to continue exercise at Home (comment)   Frequency Add 3 additional days to program exercise sessions.     Exercise Review   Progression Yes      History  Smoking Status  . Never Smoker  Smokeless Tobacco  . Never Used    Goals Met:  Improved SOB with ADL's Using PLB without cueing & demonstrates good technique Exercise tolerated well No report of cardiac concerns or symptoms Strength training completed today  Goals Unmet:  Not Applicable  Comments: Service time is from 1330 to 1500   Dr. Rush Farmer is Medical Director for Pulmonary Rehab at [facility].

## 2016-07-08 ENCOUNTER — Telehealth: Payer: Self-pay | Admitting: Gastroenterology

## 2016-07-08 DIAGNOSIS — R197 Diarrhea, unspecified: Secondary | ICD-10-CM

## 2016-07-08 NOTE — Telephone Encounter (Signed)
Please order a Cdiff stool study.  Thank you,  Jess

## 2016-07-08 NOTE — Telephone Encounter (Signed)
Jess do you want me to have her come for stool studies or treat her ?

## 2016-07-09 ENCOUNTER — Other Ambulatory Visit: Payer: BLUE CROSS/BLUE SHIELD

## 2016-07-09 DIAGNOSIS — R197 Diarrhea, unspecified: Secondary | ICD-10-CM

## 2016-07-09 NOTE — Telephone Encounter (Signed)
Patient notified to come do stool study today.

## 2016-07-10 ENCOUNTER — Telehealth: Payer: Self-pay | Admitting: Family Medicine

## 2016-07-10 LAB — CLOSTRIDIUM DIFFICILE BY PCR: Toxigenic C. Difficile by PCR: DETECTED — CR

## 2016-07-10 NOTE — Telephone Encounter (Signed)
Call from answering service that patient is positive for C diff.  This was ordered by GI- Alonza Bogus, PA-C.  This lab came to me in error- called the answering service and asked to re-direct this info to GI

## 2016-07-11 ENCOUNTER — Encounter: Payer: Self-pay | Admitting: Internal Medicine

## 2016-07-11 ENCOUNTER — Telehealth: Payer: Self-pay | Admitting: Gastroenterology

## 2016-07-11 MED ORDER — VANCOMYCIN HCL 125 MG PO CAPS: 125.0000 mg | ORAL_CAPSULE | Freq: Four times a day (QID) | ORAL | 0 refills | Status: DC

## 2016-07-11 NOTE — Progress Notes (Signed)
I will arrange f/u with me and discuss treatment options FMT could be needed if she fails a taper

## 2016-07-11 NOTE — Telephone Encounter (Signed)
Contacted patient about positive Cdiff study.  I sent a prescription for vancomycin taper to her pharmacy.  All questions answered.  She will call back in about 8 days or so with an update and if symptoms are better then she will need a note to be able to return to pulmonary rehab.

## 2016-07-12 NOTE — Progress Notes (Signed)
I called in her Rx yesterday to [facility] - so we should cancel what you did I used a short taper every 3 days for 10 doses after 10 days at qid  I plan to call her this week and sort out f/u - need to see that she is improving first

## 2016-07-21 NOTE — Progress Notes (Signed)
I communicated with her and she is improving. She needs to see me on June 21 please She knows to contact me/us sooner if sxs recurring Please notify her by My Chart

## 2016-07-29 ENCOUNTER — Encounter: Payer: Self-pay | Admitting: Gastroenterology

## 2016-08-01 ENCOUNTER — Telehealth: Payer: Self-pay | Admitting: Internal Medicine

## 2016-08-01 ENCOUNTER — Encounter: Payer: Self-pay | Admitting: Internal Medicine

## 2016-08-01 MED ORDER — VANCOMYCIN HCL 125 MG PO CAPS: 125.0000 mg | ORAL_CAPSULE | Freq: Four times a day (QID) | ORAL | 0 refills | Status: DC

## 2016-08-01 NOTE — Telephone Encounter (Signed)
Having recurrent diarrhea - on vancomycin taper Repeat rx vancomycin 125 mg qid x 14 days

## 2016-08-02 NOTE — Addendum Note (Signed)
Encounter addended by: Jewel Baize, RD on: 08/02/2016  3:31 PM<BR>    Actions taken: Flowsheet data copied forward, Visit Navigator Flowsheet section accepted

## 2016-08-09 ENCOUNTER — Encounter: Payer: Self-pay | Admitting: Internal Medicine

## 2016-08-12 ENCOUNTER — Ambulatory Visit (INDEPENDENT_AMBULATORY_CARE_PROVIDER_SITE_OTHER): Payer: BLUE CROSS/BLUE SHIELD | Admitting: Internal Medicine

## 2016-08-12 ENCOUNTER — Encounter: Payer: Self-pay | Admitting: Internal Medicine

## 2016-08-12 VITALS — BP 150/76 | HR 100 | Ht 65.0 in | Wt 124.0 lb

## 2016-08-12 DIAGNOSIS — A0472 Enterocolitis due to Clostridium difficile, not specified as recurrent: Secondary | ICD-10-CM

## 2016-08-12 MED ORDER — VANCOMYCIN HCL 125 MG PO CAPS: 125.0000 mg | ORAL_CAPSULE | Freq: Four times a day (QID) | ORAL | 1 refills | Status: AC

## 2016-08-12 MED ORDER — RIFAXIMIN 550 MG PO TABS: 550.0000 mg | ORAL_TABLET | Freq: Three times a day (TID) | ORAL | 0 refills | Status: DC

## 2016-08-12 NOTE — Progress Notes (Signed)
Deborah [name] 66 y.o. 20-Jul-1950 [id number]  Assessment & Plan:   Encounter Diagnosis  Name Primary?  . C. difficile colitis Yes    This is a recurrent problem. She is now on Deborah third regular treatment so she's had 2 relapses. Many years ago, around 2005 she went through this for over a year with recurrence. It eventually subsided and Deborah husband recall Xifaxan being one of last treatments. I know she was on multiple long tapers.  We have decided to try Xifaxan 550 mg 3 times a day for 14 days. She will complete 14 days of vancomycin in a few days and start the Xifaxan. I have given Deborah a written prescription for vancomycin. Should the Xifaxan fail or should she fail after that with recurrent diarrhea she should restart the vancomycin and let me know. At that point I think a fecal Ronalee Belts robotic a transplant using the open biome stool donors would make sense. I reviewed that in some detail with him today. They're agreeable to this plan.  I appreciate the opportunity to care for this patient. CC: Crist Infante, MD      Subjective:   Chief Complaint:Diarrhea, C. difficile colitis  HPI Deborah [name] is here with Deborah [name], she has suffered recurrent C. difficile problems this year. There was a long history over 10 years ago of protracted recurrent C. difficile treated with vancomycin type or's and Xifaxan. Over the winter she was at [facility] and R hospital's with respiratory illness received antibiotics. She had been having some diarrhea thought due to CellCept. She saw Alonza Bogus in March and C. difficile was detected. We treated Deborah with vancomycin 125 mg 4 times a day. A couple of months later the diarrhea came back after stopping. Treated again with a short taper area when she went to the short taper, every third day the diarrhea returned and she was back on 4 times a day 125 mg vancomycin and after a few days that improved. She's having a few formed soft stools a day at this  point but definitely feels better. Deborah husband relates to me that the [facility] she was on when she was hot hospitalized in the winter was discontinued by the physicians there due to concern about potentially getting Candida infection. He recalls that Xifaxan was taken in 2005 or so and seemed to be the last antibiotic she took then and allowed Deborah to be free of C. difficile problems for many years. Allergies  Allergen Reactions  . Megace Es [Megestrol Acetate]     Severe leg cramps   . Lodine [Etodolac] Rash    REACTION: rash   Current Meds  Medication Sig  . albuterol (PROVENTIL HFA;VENTOLIN HFA) 108 (90 BASE) MCG/ACT inhaler Inhale 2 puffs into the lungs every 6 (six) hours as needed. For shortness of breath.  Marland Kitchen atorvastatin (LIPITOR) 10 MG tablet Take 10 mg by mouth daily.  . cholecalciferol (VITAMIN D) 1000 units tablet Take 2,000 Units by mouth 2 (two) times a week.  . denosumab (PROLIA) 60 MG/ML SOLN injection Inject 60 mg into the skin every 6 (six) months. Administer in upper arm, thigh, or abdomen  . DULoxetine (CYMBALTA) 60 MG capsule Take 60 mg by mouth every evening.   . furosemide (LASIX) 40 MG tablet Take 40 mg by mouth 3 (three) times a week. M-W-F  . gabapentin (NEURONTIN) 400 MG capsule Take 400 mg by mouth 4 (four) times daily.  . hyoscyamine (LEVSIN SL) 0.125 MG SL  tablet DISSOLVE 1 TABLET(0.125 MG) UNDER THE TONGUE EVERY 6 HOURS AS NEEDED  . lidocaine (LIDODERM) 5 % Place 1 patch onto the skin daily. Remove & Discard patch within 12 hours or as directed by MD  . mometasone-formoterol (DULERA) 100-5 MCG/ACT AERO Inhale 2 puffs into the lungs 2 (two) times daily.  . Multiple Vitamin (MULTIVITAMIN WITH MINERALS) TABS Take 1 tablet by mouth daily.  . mycophenolate (CELLCEPT) 500 MG tablet Take 1,000 mg by mouth 2 (two) times daily.   Marland Kitchen oxycodone (OXY-IR) 5 MG capsule Take 5 mg by mouth every 6 (six) hours as needed.  . OXYGEN Inhale into the lungs. Continuous, 4 L/min when  exercising and 6 L/min when sitting  . pantoprazole (PROTONIX) 40 MG tablet Take 40 mg by mouth daily.   Vladimir Faster Glycol-Propyl Glycol 0.4-0.3 % SOLN Apply 1 drop to eye at bedtime as needed (dry).   . potassium chloride SA (K-DUR,KLOR-CON) 20 MEQ tablet Take 20 mEq by mouth 3 (three) times a week. M-W-F  . predniSONE (DELTASONE) 10 MG tablet Take two tabs daily for 5 days, then 1 tab daily for 5 days (Patient taking differently: Take 5 mg by mouth daily. Take two tabs daily for 5 days, then 1 tab daily for 5 days)  . Probiotic Product (ACIDOPHILUS SUPER PROBIOTIC PO) Take by mouth 2 (two) times daily.  . sodium chloride HYPERTONIC 3 % nebulizer solution Take 3 mLs by nebulization daily.  . tadalafil, PAH, (ADCIRCA) 20 MG tablet Take 40 mg by mouth at bedtime.  . Treprostinil (TYVASO) 0.6 MG/ML SOLN Inhale 18 mcg into the lungs 4 (four) times daily. 1.27m/2.9mL   (12breaths)  . vancomycin (VANCOCIN) 125 MG capsule Take 1 capsule (125 mg total) by mouth 4 (four) times daily.  . vitamin B-12 (CYANOCOBALAMIN) 1000 MCG tablet Take 1,000 mcg by mouth 2 (two) times a week. Tues and Thurs  . [DISCONTINUED] vancomycin (VANCOCIN) 125 MG capsule Take 1 capsule (125 mg total) by mouth 4 (four) times daily.   Past Medical History:  Diagnosis Date  . Anemia   . [place]chiectasis ([place]   . C. difficile colitis   . CAD (coronary artery disease)   . Cataract   . CHF (congestive heart failure) (HCC)    right sided heart failure from pulmonary hypertension  . COPD (chronic obstructive pulmonary disease) ([place]   . CREST (calcinosis, Raynaud's phenomenon, esophageal dysfunction, sclerodactyly, telangiectasia) ([place]   . Gallstones   . Hearing loss    right  . Hyperactive airway disease   . Hyperlipidemia   . Hypertension   . Lung cancer ([place] carcinoid tumor  2004  . Osteopenia   . Parainfluenza   . Pneumonia   . Raynaud's disease   . Respiratory syncytial virus (RSV) infection   . Restrictive lung  disease   . Shingles   . Tubulovillous adenoma of rectum 2007   Past Surgical History:  Procedure Laterality Date  . broncho pleural fistula  2004  . COLONOSCOPY    . pulmonary carcinoid  2004  . thoracotomies  2004  . VIDEO BRONCHOSCOPY  12/07/2011   Procedure: VIDEO BRONCHOSCOPY WITHOUT FLUORO;  Surgeon: KKathee Delton MD;  Location: [facility];  Service: Cardiopulmonary;  Laterality: Bilateral;   Social History   Social History  . Marital status: Married    Spouse name: DShanon [name] . Number of children: 0   Occupational History  . retired    Social History Main Topics  . Smoking status: Never  Smoker  . Smokeless tobacco: Never Used  . Alcohol use No  . Drug use: No  . Sexual activity: No   Social History   Social History Narrative   The patient is married and retired. Husband Shanon [name] is a Pharmacist, community. No children    family history is not on file. She was adopted.   Review of Systems  As above. She is on chronic O2.  Objective:   Physical Exam BP (!) 150/76   Pulse 100   Ht _0  (1.651 m)   Wt 124 lb (56.2 kg)   BMI 20.63 kg/m  Chron ill On O2  25 minutes time spent with patient > half in counseling coordination of care

## 2016-08-12 NOTE — Patient Instructions (Signed)
Today we are giving you samples of xifaxan to start after you finish your vancomycin.   We are giving you a rx for the vancomycin to restart if you start back having diarrhea.  Please call us if you have to restart.  I appreciate the opportunity to care for you. Silvano Rusk, MD, [facility]

## 2016-08-12 NOTE — Progress Notes (Signed)
Discharge Summary  Patient Details  Name: Deborah [name] MRN: [id number] Date of Birth: 05/31/1950 Referring Provider:     Pulmonary Rehab Walk Test from 02/12/2016 in [place]  Referring Provider  Dr. Nelda Marseille       Number of Visits: 20  Reason for Discharge:  Patient independent in their exercise.She graduated 4 sessions early due to a recurring episode of C. Diff.  She will return to pulmonary maintenance after she has a negative stool sample after she has completed her treatment.  Smoking History:  History  Smoking Status  . Never Smoker  Smokeless Tobacco  . Never Used    Diagnosis:  Pulmonary hypertension ([place])  ADL UCSD:     Pulmonary Assessment Scores    Row Name 02/25/16 1121         ADL UCSD   ADL Phase Entry     SOB Score total 67        Initial Exercise Prescription:   Discharge Exercise Prescription (Final Exercise Prescription Changes):     Exercise Prescription Changes - 07/06/16 1527      Response to Exercise   Blood Pressure (Admit) 114/62   Blood Pressure (Exercise) 130/60   Blood Pressure (Exit) 108/58   Heart Rate (Admit) 112 bpm   Heart Rate (Exercise) 127 bpm   Heart Rate (Exit) 116 bpm   Oxygen Saturation (Admit) 98 %   Oxygen Saturation (Exercise) 93 %   Oxygen Saturation (Exit) 96 %   Rating of Perceived Exertion (Exercise) 13   Perceived Dyspnea (Exercise) 1   Duration Progress to 45 minutes of aerobic exercise without signs/symptoms of physical distress   Intensity THRR unchanged     Progression   Progression Continue to progress workloads to maintain intensity without signs/symptoms of physical distress.     Resistance Training   Training Prescription Yes   Weight orange bands   Reps 10-15   Time 10 Minutes     Interval Training   Interval Training No     Oxygen   Oxygen Continuous   Liters 4-6     Recumbant Bike   Level 2   Minutes 17     NuStep   Level 3   Minutes 17    METs 1.8     Track   Laps 6   Minutes 17     Home Exercise Plan   Plans to continue exercise at Home (comment)   Frequency Add 3 additional days to program exercise sessions.     Exercise Review   Progression Yes      Functional Capacity:     6 Minute Walk    Row Name 02/26/16 1605         6 Minute Walk   Phase Mid Program  for doctors office concerning o2 needs     Distance 675 feet     Walk Time 6 minutes     # of Rest Breaks 0     MPH 1.27     METS 1.92     RPE 16     Perceived Dyspnea  3     Symptoms No     Resting HR 124 bpm     Resting BP 150/66     Max Ex. HR 139 bpm     Max Ex. BP 210/80     2 Minute Post BP 150/50       Interval HR   Baseline HR 124  1 Minute HR 128     2 Minute HR 130     3 Minute HR 132     4 Minute HR 139     5 Minute HR 139     6 Minute HR 136     2 Minute Post HR 128     Interval Heart Rate? Yes       Interval Oxygen   Interval Oxygen? Yes     Baseline Oxygen Saturation % 94 %     Baseline Liters of Oxygen 4 L     1 Minute Oxygen Saturation % 92 %     1 Minute Liters of Oxygen 4 L     2 Minute Oxygen Saturation % 90 %     2 Minute Liters of Oxygen 4 L     3 Minute Oxygen Saturation % 89 %     3 Minute Liters of Oxygen 4 L     4 Minute Oxygen Saturation % 88 %     4 Minute Liters of Oxygen 4 L     5 Minute Oxygen Saturation % 90 %     5 Minute Liters of Oxygen 6 L     6 Minute Oxygen Saturation % 91 %     6 Minute Liters of Oxygen 6 L     2 Minute Post Oxygen Saturation % 94 %     2 Minute Post Liters of Oxygen 6 L        Psychological, QOL, Others - Outcomes: PHQ 2/9: Depression screen [facility] 2/9 02/06/2016 07/09/2014 01/21/2014  Decreased Interest 1 1 0  Down, Depressed, Hopeless 0 0 0  PHQ - 2 Score 1 1 0  Some recent data might be hidden    Quality of Life:     Quality of Life - 02/25/16 1121      Quality of Life Scores   Health/Function Pre 9.17 %   Socioeconomic Pre 20.93 %   Psych/Spiritual  Pre 14.57 %   Family Pre 10.5 %   GLOBAL Pre 13.05 %      Personal Goals: Goals established at orientation with interventions provided to work toward goal.     Personal Goals and Risk Factors at Admission - 02/19/16 1631      Core Components/Risk Factors/Patient Goals on Admission    Weight Management Weight Maintenance       Personal Goals Discharge:     Goals and Risk Factor Review    Row Name 02/19/16 1632 03/15/16 1456 04/06/16 0906 04/29/16 0834 06/01/16 0957     Core Components/Risk Factors/Patient Goals Review   Personal Goals Review Increase Strength and Stamina;Improve shortness of breath with ADL's;Weight Management/Obesity Increase Strength and Stamina;Improve shortness of breath with ADL's;Weight Management/Obesity  weight gain, not weight loss Increase Strength and Stamina;Improve shortness of breath with ADL's;Weight Management/Obesity Improve shortness of breath with ADL's;Weight Management/Obesity Weight Management/Obesity;Improve shortness of breath with ADL's;Develop more efficient breathing techniques such as purse lipped breathing and diaphragmatic breathing and practicing self-pacing with activity.   Review Just started program today, work on [employer] management, too early to see any progression. 6 sessions completed, looks much better than at start of program, adjusting well Presently hospitalized for respiratory failure and atelectasis Released from hospital 2 weeks ago, is working with home PT, in hopes of returning yo pulmonary rehab next week developed c-diff, has completed antibiotic treatment, will return to the program this week   Expected Outcomes  - continue to progress with workload  increases Continue to work on goals when she is able to return to program continue to work on goals has been absent 1 month, will be starting all over, progress as tolerated   Row Name 06/29/16 1631             Core Components/Risk Factors/Patient Goals Review   Personal  Goals Review Weight Management/Obesity;Develop more efficient breathing techniques such as purse lipped breathing and diaphragmatic breathing and practicing self-pacing with activity.;Improve shortness of breath with ADL's       Review over c-diff, is back at baseline       Expected Outcomes continue to progress as tolerated.          Nutrition & Weight - Outcomes:     Pre Biometrics - 02/19/16 1554      Pre Biometrics   Grip Strength 22 kg       Nutrition:     Nutrition Therapy & Goals - 03/30/16 1447      Nutrition Therapy   Diet High Calorie, High Protein     Personal Nutrition Goals   Nutrition Goal Prevent further wt loss/promote wt gain     Intervention Plan   Intervention Prescribe, educate and counsel regarding individualized specific dietary modifications aiming towards targeted core components such as weight, hypertension, lipid management, diabetes, heart failure and other comorbidities.   Expected Outcomes Short Term Goal: Understand basic principles of dietary content, such as calories, fat, sodium, cholesterol and nutrients.;Long Term Goal: Adherence to prescribed nutrition plan.      Nutrition Discharge:     Nutrition Assessments - 03/30/16 1447      Rate Your Plate Scores   Pre Score 53      Education Questionnaire Score:     Knowledge Questionnaire Score - 02/25/16 1120      Knowledge Questionnaire Score   Pre Score 13/13      Goals reviewed with patient; copy given to patient.

## 2016-08-12 NOTE — Addendum Note (Signed)
Encounter addended by: Lance Morin, RN on: 08/12/2016  8:47 AM<BR>    Actions taken: Sign clinical note

## 2016-08-19 ENCOUNTER — Encounter: Payer: Self-pay | Admitting: Internal Medicine

## 2016-09-01 ENCOUNTER — Encounter: Payer: Self-pay | Admitting: Internal Medicine

## 2016-09-03 ENCOUNTER — Other Ambulatory Visit: Payer: Self-pay | Admitting: Internal Medicine

## 2016-09-03 DIAGNOSIS — A0472 Enterocolitis due to Clostridium difficile, not specified as recurrent: Secondary | ICD-10-CM

## 2016-09-03 MED ORDER — VANCOMYCIN HCL 125 MG PO CAPS: ORAL_CAPSULE | ORAL | 0 refills | Status: DC

## 2016-09-03 MED ORDER — MYCOPHENOLATE MOFETIL 500 MG PO TABS: 1000.0000 mg | ORAL_TABLET | Freq: Two times a day (BID) | ORAL | Status: DC

## 2016-09-27 ENCOUNTER — Encounter: Payer: Self-pay | Admitting: Internal Medicine

## 2016-10-07 ENCOUNTER — Encounter: Payer: Self-pay | Admitting: Internal Medicine

## 2016-10-27 ENCOUNTER — Encounter (HOSPITAL_COMMUNITY)
Admission: RE | Admit: 2016-10-27 | Discharge: 2016-10-27 | Disposition: A | Discharge status: Home / Self Care | Payer: Self-pay | Source: Ambulatory Visit | Attending: Internal Medicine | Admitting: Internal Medicine

## 2016-10-27 DIAGNOSIS — M349 Systemic sclerosis, unspecified: Secondary | ICD-10-CM | POA: Insufficient documentation

## 2016-10-27 DIAGNOSIS — I272 Pulmonary hypertension, unspecified: Secondary | ICD-10-CM | POA: Insufficient documentation

## 2016-10-27 NOTE — Progress Notes (Signed)
Deborah [name] restarted the Pulmonary Maintenance today. She tolerated exercise well without complaints.

## 2016-10-29 ENCOUNTER — Encounter (HOSPITAL_COMMUNITY)
Admission: RE | Admit: 2016-10-29 | Discharge: 2016-10-29 | Disposition: A | Discharge status: Home / Self Care | Payer: Self-pay | Source: Ambulatory Visit | Attending: Internal Medicine | Admitting: Internal Medicine

## 2016-11-01 ENCOUNTER — Encounter: Payer: Self-pay | Admitting: Internal Medicine

## 2016-11-01 ENCOUNTER — Encounter (HOSPITAL_COMMUNITY)
Admission: RE | Admit: 2016-11-01 | Discharge: 2016-11-01 | Disposition: A | Discharge status: Home / Self Care | Payer: Self-pay | Source: Ambulatory Visit | Attending: Internal Medicine | Admitting: Internal Medicine

## 2016-11-03 ENCOUNTER — Encounter (HOSPITAL_COMMUNITY)
Admission: RE | Admit: 2016-11-03 | Discharge: 2016-11-03 | Disposition: A | Discharge status: Home / Self Care | Payer: Self-pay | Source: Ambulatory Visit | Attending: Internal Medicine | Admitting: Internal Medicine

## 2016-11-04 ENCOUNTER — Ambulatory Visit (HOSPITAL_COMMUNITY)
Admission: RE | Admit: 2016-11-04 | Discharge: 2016-11-04 | Disposition: A | Discharge status: Home / Self Care | Payer: BLUE CROSS/BLUE SHIELD | Source: Ambulatory Visit | Attending: Internal Medicine | Admitting: Internal Medicine

## 2016-11-04 ENCOUNTER — Encounter (HOSPITAL_COMMUNITY): Payer: Self-pay

## 2016-11-04 DIAGNOSIS — M810 Age-related osteoporosis without current pathological fracture: Secondary | ICD-10-CM | POA: Insufficient documentation

## 2016-11-04 MED ORDER — DENOSUMAB 60 MG/ML ~~LOC~~ SOLN
60.0000 mg | Freq: Once | SUBCUTANEOUS | Status: AC
Start: 2016-11-04 — End: 2016-11-04
  Administered 2016-11-04: 60 mg via SUBCUTANEOUS
  Filled 2016-11-04: qty 1

## 2016-11-04 NOTE — Discharge Instructions (Signed)
Prolia Denosumab injection What is this medicine? DENOSUMAB (den oh sue mab) slows bone breakdown. Prolia is used to treat osteoporosis in women after menopause and in men. Delton See is used to treat a high calcium level due to cancer and to prevent bone fractures and other bone problems caused by multiple myeloma or cancer bone metastases. Delton See is also used to treat giant cell tumor of the bone. This medicine may be used for other purposes; ask your health care provider or pharmacist if you have questions. COMMON BRAND NAME(S): Prolia, XGEVA What should I tell my health care provider before I take this medicine? They need to know if you have any of these conditions: -dental disease -having surgery or tooth extraction -infection -kidney disease -low levels of calcium or Vitamin D in the blood -malnutrition -on hemodialysis -skin conditions or sensitivity -thyroid or parathyroid disease -an unusual reaction to denosumab, other medicines, foods, dyes, or preservatives -pregnant or trying to get pregnant -breast-feeding How should I use this medicine? This medicine is for injection under the skin. It is given by a health care professional in a hospital or clinic setting. If you are getting Prolia, a special MedGuide will be given to you by the pharmacist with each prescription and refill. Be sure to read this information carefully each time. For Prolia, talk to your pediatrician regarding the use of this medicine in children. Special care may be needed. For Delton See, talk to your pediatrician regarding the use of this medicine in children. While this drug may be prescribed for children as young as 13 years for selected conditions, precautions do apply. Overdosage: If you think you have taken too much of this medicine contact a poison control center or emergency room at once. NOTE: This medicine is only for you. Do not share this medicine with others. What if I miss a dose? It is important not  to miss your dose. Call your doctor or health care professional if you are unable to keep an appointment. What may interact with this medicine? Do not take this medicine with any of the following medications: -other medicines containing denosumab This medicine may also interact with the following medications: -medicines that lower your chance of fighting infection -steroid medicines like prednisone or cortisone This list may not describe all possible interactions. Give your health care provider a list of all the medicines, herbs, non-prescription drugs, or dietary supplements you use. Also tell them if you smoke, drink alcohol, or use illegal drugs. Some items may interact with your medicine. What should I watch for while using this medicine? Visit your doctor or health care professional for regular checks on your progress. Your doctor or health care professional may order blood tests and other tests to see how you are doing. Call your doctor or health care professional for advice if you get a fever, chills or sore throat, or other symptoms of a cold or flu. Do not treat yourself. This drug may decrease your body's ability to fight infection. Try to avoid being around people who are sick. You should make sure you get enough calcium and vitamin D while you are taking this medicine, unless your doctor tells you not to. Discuss the foods you eat and the vitamins you take with your health care professional. See your dentist regularly. Brush and floss your teeth as directed. Before you have any dental work done, tell your dentist you are receiving this medicine. Do not become pregnant while taking this medicine or for 5 months  after stopping it. Talk with your doctor or health care professional about your birth control options while taking this medicine. Women should inform their doctor if they wish to become pregnant or think they might be pregnant. There is a potential for serious side effects to an unborn  child. Talk to your health care professional or pharmacist for more information. What side effects may I notice from receiving this medicine? Side effects that you should report to your doctor or health care professional as soon as possible: -allergic reactions like skin rash, itching or hives, swelling of the face, lips, or tongue -bone pain -breathing problems -dizziness -jaw pain, especially after dental work -redness, blistering, peeling of the skin -signs and symptoms of infection like fever or chills; cough; sore throat; pain or trouble passing urine -signs of low calcium like fast heartbeat, muscle cramps or muscle pain; pain, tingling, numbness in the hands or feet; seizures -unusual bleeding or bruising -unusually weak or tired Side effects that usually do not require medical attention (report to your doctor or health care professional if they continue or are bothersome): -constipation -diarrhea -headache -joint pain -loss of appetite -muscle pain -runny nose -tiredness -upset stomach This list may not describe all possible side effects. Call your doctor for medical advice about side effects. You may report side effects to FDA at 1-800-FDA-1088. Where should I keep my medicine? This medicine is only given in a clinic, doctor's office, or other health care setting and will not be stored at home. NOTE: This sheet is a summary. It may not cover all possible information. If you have questions about this medicine, talk to your doctor, pharmacist, or health care provider.  2018 Elsevier/Gold Standard (2016-03-02 19:17:21)

## 2016-11-05 ENCOUNTER — Encounter (HOSPITAL_COMMUNITY)
Admission: RE | Admit: 2016-11-05 | Discharge: 2016-11-05 | Disposition: A | Discharge status: Home / Self Care | Payer: Self-pay | Source: Ambulatory Visit | Attending: Internal Medicine | Admitting: Internal Medicine

## 2016-11-08 ENCOUNTER — Encounter (HOSPITAL_COMMUNITY)
Admission: RE | Admit: 2016-11-08 | Discharge: 2016-11-08 | Disposition: A | Discharge status: Home / Self Care | Payer: Self-pay | Source: Ambulatory Visit | Attending: Internal Medicine | Admitting: Internal Medicine

## 2016-11-10 ENCOUNTER — Encounter (HOSPITAL_COMMUNITY)
Admission: RE | Admit: 2016-11-10 | Discharge: 2016-11-10 | Disposition: A | Discharge status: Home / Self Care | Payer: Self-pay | Source: Ambulatory Visit | Attending: Internal Medicine | Admitting: Internal Medicine

## 2016-11-12 ENCOUNTER — Encounter (HOSPITAL_COMMUNITY): Payer: Self-pay

## 2016-11-15 ENCOUNTER — Encounter (HOSPITAL_COMMUNITY)
Admission: RE | Admit: 2016-11-15 | Discharge: 2016-11-15 | Disposition: A | Discharge status: Home / Self Care | Payer: Self-pay | Source: Ambulatory Visit | Attending: Internal Medicine | Admitting: Internal Medicine

## 2016-11-16 ENCOUNTER — Encounter: Payer: Self-pay | Admitting: Emergency Medicine

## 2016-11-16 ENCOUNTER — Ambulatory Visit (INDEPENDENT_AMBULATORY_CARE_PROVIDER_SITE_OTHER): Payer: BLUE CROSS/BLUE SHIELD | Admitting: Emergency Medicine

## 2016-11-16 DIAGNOSIS — J849 Interstitial pulmonary disease, unspecified: Secondary | ICD-10-CM | POA: Diagnosis not present

## 2016-11-16 DIAGNOSIS — I272 Pulmonary hypertension, unspecified: Secondary | ICD-10-CM

## 2016-11-16 DIAGNOSIS — J479 Bronchiectasis, uncomplicated: Secondary | ICD-10-CM | POA: Diagnosis not present

## 2016-11-16 NOTE — Assessment & Plan Note (Signed)
Currently managed on Tyvaso, and Adcirca, supplemental oxygen. Follows also with [facility] clinic

## 2016-11-16 NOTE — Assessment & Plan Note (Signed)
Serial CT scans being followed at [facility]

## 2016-11-16 NOTE — Patient Instructions (Addendum)
Please continue your current medications as you have been taking them Flu shot today Follow with Dr Lamonte Sakai in 6 months or sooner if you have any problems

## 2016-11-16 NOTE — Assessment & Plan Note (Signed)
Continue Dulera. Continue hypertonic saline and pulmonary hygiene.

## 2016-11-16 NOTE — Progress Notes (Signed)
Subjective:    Patient ID: Deborah [name], female    DOB: 1950/03/21, 66 y.o.   MRN: [id number]  HPI 66 year old woman, never smoker, with a history of crest syndrome and associated interstitial lung disease, PAH. She is on Tyvaso and Adcirca (Dr Gilles Chiquito). She also has a history of a left lung resection for carcinoid tumor in 2004. She has been on chronic immunosuppression as severe bronchiectasis. She was hospitalized with an acute exacerbation and resp failure in the setting of RSV in February. Treated with ceftriaxone and clindamycin. Unfortunately she developed C diff, could not go to pulm rehab, started vancomycin, finishes this weekend.  Her mucus clearance regimen > mucinex, saline nebs.  Immunosuppression > cellcept and pred 640m  ROV 11/16/16 -- [age]year old woman with a history of crest syndrome, associated interstitial lung disease and secondary pulmonary hypertension on tyvaso and adcirca.  She has been on chronic suppression and has resultant severe bronchiectasis. Also with a history of a left lung resection for carcinoid remotely. Based on her based on her C. Difficile colitis, her immunosuppression has been changed. Currently on cellcept 500 bid, pred 10. She is doing Pulm rehab maintenance program. No abx since last time for bronchiectasis. Remains on dulera. Hypertonic saline qd.    Review of Systems  Constitutional: Negative for fever and unexpected weight change.  HENT: Positive for congestion. Negative for dental problem, ear pain, nosebleeds, postnasal drip, rhinorrhea, sinus pressure, sneezing, sore throat and trouble swallowing.   Eyes: Negative for redness and itching.  Respiratory: Negative for cough, chest tightness and wheezing.   Cardiovascular: Negative for palpitations and leg swelling.  Gastrointestinal: Negative for nausea and vomiting.  Genitourinary: Negative for dysuria.  Musculoskeletal: Negative for joint swelling.  Skin: Negative for rash.  Neurological:  Negative for headaches.  Hematological: Does not bruise/bleed easily.  Psychiatric/Behavioral: Negative for dysphoric mood. The patient is nervous/anxious.    Past Medical History:  Diagnosis Date  . Anemia   . Bronchiectasis ([place]   . C. difficile colitis   . CAD (coronary artery disease)   . Cataract   . CHF (congestive heart failure) (HCC)    right sided heart failure from pulmonary hypertension  . COPD (chronic obstructive pulmonary disease) ([facility]   . CREST (calcinosis, Raynaud's phenomenon, esophageal dysfunction, sclerodactyly, telangiectasia) ([place]   . Gallstones   . Hearing loss    right  . Hyperactive airway disease   . Hyperlipidemia   . Hypertension   . Lung cancer ([place] carcinoid tumor  2004  . Osteopenia   . Parainfluenza   . Pneumonia   . Raynaud's disease   . Respiratory syncytial virus (RSV) infection   . Restrictive lung disease   . Shingles   . Tubulovillous adenoma of rectum 2007     Family History  Problem Relation Age of Onset  . Adopted: Yes     Social History   Social History  . Marital status: Married    Spouse name: DShanon Brow . Number of children: 0  . Years of education: N/A   Occupational History  . retired    Social History Main Topics  . Smoking status: Never Smoker  . Smokeless tobacco: Never Used  . Alcohol use No  . Drug use: No  . Sexual activity: No   Other Topics Concern  . Not on file   Social History Narrative   The patient is married and retired. Husband DShanon Browis a dPharmacist, community No children     Allergies  Allergen Reactions  . Megace Es [Megestrol Acetate]     Severe leg cramps   . Lodine [Etodolac] Rash    REACTION: rash     Outpatient Medications Prior to Visit  Medication Sig Dispense Refill  . albuterol (PROVENTIL HFA;VENTOLIN HFA) 108 (90 BASE) MCG/ACT inhaler Inhale 2 puffs into the lungs every 6 (six) hours as needed. For shortness of breath. 1 Inhaler 3  . atorvastatin (LIPITOR) 10 MG tablet Take 10 mg by  mouth daily.    . cholecalciferol (VITAMIN D) 1000 units tablet Take 2,000 Units by mouth 2 (two) times a week.    . denosumab (PROLIA) 60 MG/ML SOLN injection Inject 60 mg into the skin every 6 (six) months. Administer in upper arm, thigh, or abdomen    . DULoxetine (CYMBALTA) 60 MG capsule Take 60 mg by mouth every evening.     . furosemide (LASIX) 40 MG tablet Take 40 mg by mouth 3 (three) times a week. M-W-F    . gabapentin (NEURONTIN) 400 MG capsule Take 400 mg by mouth 4 (four) times daily.    . hyoscyamine (LEVSIN SL) 0.125 MG SL tablet DISSOLVE 1 TABLET(0.125 MG) UNDER THE TONGUE EVERY 6 HOURS AS NEEDED 15 tablet 0  . lidocaine (LIDODERM) 5 % Place 1 patch onto the skin daily. Remove & Discard patch within 12 hours or as directed by MD    . mometasone-formoterol (DULERA) 100-5 MCG/ACT AERO Inhale 2 puffs into the lungs 2 (two) times daily. 1 Inhaler 3  . Multiple Vitamin (MULTIVITAMIN WITH MINERALS) TABS Take 1 tablet by mouth daily.    Marland Kitchen oxycodone (OXY-IR) 5 MG capsule Take 5 mg by mouth every 6 (six) hours as needed.    . OXYGEN Inhale into the lungs. Continuous, 4 L/min when exercising and 6 L/min when sitting    . pantoprazole (PROTONIX) 40 MG tablet Take 40 mg by mouth daily.     Vladimir Faster Glycol-Propyl Glycol 0.4-0.3 % SOLN Apply 1 drop to eye at bedtime as needed (dry).     . potassium chloride SA (K-DUR,KLOR-CON) 20 MEQ tablet Take 20 mEq by mouth 3 (three) times a week. M-W-F    . Probiotic Product (ACIDOPHILUS SUPER PROBIOTIC PO) Take by mouth 2 (two) times daily.    . rifaximin (XIFAXAN) 550 MG TABS tablet Take 1 tablet (550 mg total) by mouth 3 (three) times daily. 42 tablet 0  . sodium chloride HYPERTONIC 3 % nebulizer solution Take 3 mLs by nebulization daily.    . tadalafil, PAH, (ADCIRCA) 20 MG tablet Take 40 mg by mouth at bedtime.    . Treprostinil (TYVASO) 0.6 MG/ML SOLN Inhale 18 mcg into the lungs 4 (four) times daily. 1.19m/2.9mL   (12breaths)    . vancomycin  (VANCOCIN) 125 MG capsule Start at 3x/day and taper as directed by Dr. GCarlean Purl50 capsule 0  . vitamin B-12 (CYANOCOBALAMIN) 1000 MCG tablet Take 1,000 mcg by mouth 2 (two) times a week. Tues and Thurs    . mycophenolate (CELLCEPT) 500 MG tablet Take 2 tablets (1,000 mg total) by mouth 2 (two) times daily. On hold    . predniSONE (DELTASONE) 10 MG tablet Take two tabs daily for 5 days, then 1 tab daily for 5 days (Patient taking differently: Take 5 mg by mouth daily. Take two tabs daily for 5 days, then 1 tab daily for 5 days) 15 tablet 0   No facility-administered medications prior to visit.  Objective:   Physical Exam Vitals:   11/16/16 1526  BP: 122/80  Pulse: (!) 112  SpO2: 96%  Weight: 125 lb (56.7 kg)  Height: _0  (1.651 m)   Gen: Pleasant, well-nourished, in no distress,  normal affect  ENT: No lesions,  mouth clear,  oropharynx clear, no postnasal drip  Neck: No JVD, no stridor  Lungs: No use of accessory muscles, clear without rales or rhonchi  Cardiovascular: RRR, heart sounds normal, no murmur or gallops, no peripheral edema  Musculoskeletal: No deformities, no cyanosis or clubbing  Neuro: alert, non focal  Skin: Warm, no lesions or rashes     Assessment & Plan:  Pulmonary hypertension Currently managed on Tyvaso, and Adcirca, supplemental oxygen. Follows also with [facility]  Bronchiectasis without acute exacerbation ([place]) Continue Dulera. Continue hypertonic saline and pulmonary hygiene.  ILD (interstitial lung disease) ([place]) Serial CT scans being followed at [facility], MD, PhD 11/16/2016, 5:25 PM [facility] [phone] or if no answer [phone]

## 2016-11-17 ENCOUNTER — Encounter (HOSPITAL_COMMUNITY)
Admission: RE | Admit: 2016-11-17 | Discharge: 2016-11-17 | Disposition: A | Discharge status: Home / Self Care | Payer: Self-pay | Source: Ambulatory Visit | Attending: Internal Medicine | Admitting: Internal Medicine

## 2016-11-19 ENCOUNTER — Encounter (HOSPITAL_COMMUNITY)
Admission: RE | Admit: 2016-11-19 | Discharge: 2016-11-19 | Disposition: A | Discharge status: Home / Self Care | Payer: Self-pay | Source: Ambulatory Visit | Attending: Internal Medicine | Admitting: Internal Medicine

## 2016-11-22 ENCOUNTER — Encounter (HOSPITAL_COMMUNITY)
Admission: RE | Admit: 2016-11-22 | Discharge: 2016-11-22 | Disposition: A | Discharge status: Home / Self Care | Payer: Self-pay | Source: Ambulatory Visit | Attending: Internal Medicine | Admitting: Internal Medicine

## 2016-11-22 DIAGNOSIS — M349 Systemic sclerosis, unspecified: Secondary | ICD-10-CM | POA: Insufficient documentation

## 2016-11-22 DIAGNOSIS — I272 Pulmonary hypertension, unspecified: Secondary | ICD-10-CM | POA: Insufficient documentation

## 2016-11-24 ENCOUNTER — Encounter (HOSPITAL_COMMUNITY): Payer: Self-pay

## 2016-11-26 ENCOUNTER — Encounter (HOSPITAL_COMMUNITY)
Admission: RE | Admit: 2016-11-26 | Discharge: 2016-11-26 | Disposition: A | Discharge status: Home / Self Care | Payer: Self-pay | Source: Ambulatory Visit | Attending: Internal Medicine | Admitting: Internal Medicine

## 2016-11-29 ENCOUNTER — Encounter (HOSPITAL_COMMUNITY)
Admission: RE | Admit: 2016-11-29 | Discharge: 2016-11-29 | Disposition: A | Discharge status: Home / Self Care | Payer: Self-pay | Source: Ambulatory Visit | Attending: Internal Medicine | Admitting: Internal Medicine

## 2016-12-01 ENCOUNTER — Encounter (HOSPITAL_COMMUNITY)
Admission: RE | Admit: 2016-12-01 | Discharge: 2016-12-01 | Disposition: A | Discharge status: Home / Self Care | Payer: Self-pay | Source: Ambulatory Visit | Attending: Internal Medicine | Admitting: Internal Medicine

## 2016-12-03 ENCOUNTER — Encounter (HOSPITAL_COMMUNITY)
Admission: RE | Admit: 2016-12-03 | Discharge: 2016-12-03 | Disposition: A | Discharge status: Home / Self Care | Payer: Self-pay | Source: Ambulatory Visit | Attending: Internal Medicine | Admitting: Internal Medicine

## 2016-12-06 ENCOUNTER — Encounter (HOSPITAL_COMMUNITY)
Admission: RE | Admit: 2016-12-06 | Discharge: 2016-12-06 | Disposition: A | Discharge status: Home / Self Care | Payer: Self-pay | Source: Ambulatory Visit | Attending: Internal Medicine | Admitting: Internal Medicine

## 2016-12-08 ENCOUNTER — Encounter (HOSPITAL_COMMUNITY)
Admission: RE | Admit: 2016-12-08 | Discharge: 2016-12-08 | Disposition: A | Discharge status: Home / Self Care | Payer: Self-pay | Source: Ambulatory Visit | Attending: Internal Medicine | Admitting: Internal Medicine

## 2016-12-10 ENCOUNTER — Encounter (HOSPITAL_COMMUNITY)
Admission: RE | Admit: 2016-12-10 | Discharge: 2016-12-10 | Disposition: A | Discharge status: Home / Self Care | Payer: Self-pay | Source: Ambulatory Visit | Attending: Internal Medicine | Admitting: Internal Medicine

## 2016-12-13 ENCOUNTER — Encounter (HOSPITAL_COMMUNITY)
Admission: RE | Admit: 2016-12-13 | Discharge: 2016-12-13 | Disposition: A | Discharge status: Home / Self Care | Payer: Self-pay | Source: Ambulatory Visit | Attending: Internal Medicine | Admitting: Internal Medicine

## 2016-12-15 ENCOUNTER — Encounter (HOSPITAL_COMMUNITY): Payer: Self-pay

## 2016-12-17 ENCOUNTER — Encounter (HOSPITAL_COMMUNITY)
Admission: RE | Admit: 2016-12-17 | Discharge: 2016-12-17 | Disposition: A | Discharge status: Home / Self Care | Payer: Self-pay | Source: Ambulatory Visit | Attending: Internal Medicine | Admitting: Internal Medicine

## 2016-12-20 ENCOUNTER — Encounter (HOSPITAL_COMMUNITY)
Admission: RE | Admit: 2016-12-20 | Discharge: 2016-12-20 | Disposition: A | Discharge status: Home / Self Care | Payer: Self-pay | Source: Ambulatory Visit | Attending: Internal Medicine | Admitting: Internal Medicine

## 2016-12-22 ENCOUNTER — Encounter (HOSPITAL_COMMUNITY)
Admission: RE | Admit: 2016-12-22 | Discharge: 2016-12-22 | Disposition: A | Discharge status: Home / Self Care | Payer: Self-pay | Source: Ambulatory Visit | Attending: Internal Medicine | Admitting: Internal Medicine

## 2016-12-24 ENCOUNTER — Encounter (HOSPITAL_COMMUNITY)
Admission: RE | Admit: 2016-12-24 | Discharge: 2016-12-24 | Disposition: A | Discharge status: Home / Self Care | Payer: BLUE CROSS/BLUE SHIELD | Source: Ambulatory Visit | Attending: Internal Medicine | Admitting: Internal Medicine

## 2016-12-24 DIAGNOSIS — I272 Pulmonary hypertension, unspecified: Secondary | ICD-10-CM | POA: Insufficient documentation

## 2016-12-24 DIAGNOSIS — M349 Systemic sclerosis, unspecified: Secondary | ICD-10-CM | POA: Insufficient documentation

## 2016-12-27 ENCOUNTER — Encounter (HOSPITAL_COMMUNITY)
Admission: RE | Admit: 2016-12-27 | Discharge: 2016-12-27 | Disposition: A | Discharge status: Home / Self Care | Payer: Self-pay | Source: Ambulatory Visit | Attending: Internal Medicine | Admitting: Internal Medicine

## 2016-12-29 ENCOUNTER — Encounter (HOSPITAL_COMMUNITY)
Admission: RE | Admit: 2016-12-29 | Discharge: 2016-12-29 | Disposition: A | Discharge status: Home / Self Care | Payer: Self-pay | Source: Ambulatory Visit | Attending: Internal Medicine | Admitting: Internal Medicine

## 2016-12-31 ENCOUNTER — Encounter (HOSPITAL_COMMUNITY)
Admission: RE | Admit: 2016-12-31 | Discharge: 2016-12-31 | Disposition: A | Discharge status: Home / Self Care | Payer: Self-pay | Source: Ambulatory Visit | Attending: Internal Medicine | Admitting: Internal Medicine

## 2017-01-03 ENCOUNTER — Encounter (HOSPITAL_COMMUNITY)
Admission: RE | Admit: 2017-01-03 | Discharge: 2017-01-03 | Disposition: A | Discharge status: Home / Self Care | Payer: Self-pay | Source: Ambulatory Visit | Attending: Internal Medicine | Admitting: Internal Medicine

## 2017-01-05 ENCOUNTER — Encounter (HOSPITAL_COMMUNITY)
Admission: RE | Admit: 2017-01-05 | Discharge: 2017-01-05 | Disposition: A | Discharge status: Home / Self Care | Payer: Self-pay | Source: Ambulatory Visit | Attending: Internal Medicine | Admitting: Internal Medicine

## 2017-01-07 ENCOUNTER — Encounter (HOSPITAL_COMMUNITY)
Admission: RE | Admit: 2017-01-07 | Discharge: 2017-01-07 | Disposition: A | Discharge status: Home / Self Care | Payer: BLUE CROSS/BLUE SHIELD | Source: Ambulatory Visit | Attending: Internal Medicine | Admitting: Internal Medicine

## 2017-01-10 ENCOUNTER — Encounter (HOSPITAL_COMMUNITY)
Admission: RE | Admit: 2017-01-10 | Discharge: 2017-01-10 | Disposition: A | Discharge status: Home / Self Care | Payer: Self-pay | Source: Ambulatory Visit | Attending: Internal Medicine | Admitting: Internal Medicine

## 2017-01-12 ENCOUNTER — Encounter (HOSPITAL_COMMUNITY)
Admission: RE | Admit: 2017-01-12 | Discharge: 2017-01-12 | Disposition: A | Discharge status: Home / Self Care | Payer: Self-pay | Source: Ambulatory Visit | Attending: Internal Medicine | Admitting: Internal Medicine

## 2017-01-14 ENCOUNTER — Encounter (HOSPITAL_COMMUNITY): Payer: Self-pay

## 2017-01-17 ENCOUNTER — Encounter (HOSPITAL_COMMUNITY)
Admission: RE | Admit: 2017-01-17 | Discharge: 2017-01-17 | Disposition: A | Discharge status: Home / Self Care | Payer: Self-pay | Source: Ambulatory Visit | Attending: Internal Medicine | Admitting: Internal Medicine

## 2017-01-19 ENCOUNTER — Encounter (HOSPITAL_COMMUNITY)
Admission: RE | Admit: 2017-01-19 | Discharge: 2017-01-19 | Disposition: A | Discharge status: Home / Self Care | Payer: Self-pay | Source: Ambulatory Visit | Attending: Internal Medicine | Admitting: Internal Medicine

## 2017-01-21 ENCOUNTER — Encounter (HOSPITAL_COMMUNITY)
Admission: RE | Admit: 2017-01-21 | Discharge: 2017-01-21 | Disposition: A | Discharge status: Home / Self Care | Payer: Self-pay | Source: Ambulatory Visit | Attending: Internal Medicine | Admitting: Internal Medicine

## 2017-01-24 ENCOUNTER — Encounter (HOSPITAL_COMMUNITY)
Admission: RE | Admit: 2017-01-24 | Discharge: 2017-01-24 | Disposition: A | Discharge status: Home / Self Care | Payer: Self-pay | Source: Ambulatory Visit | Attending: Internal Medicine | Admitting: Internal Medicine

## 2017-01-24 DIAGNOSIS — M349 Systemic sclerosis, unspecified: Secondary | ICD-10-CM | POA: Insufficient documentation

## 2017-01-24 DIAGNOSIS — I272 Pulmonary hypertension, unspecified: Secondary | ICD-10-CM | POA: Insufficient documentation

## 2017-01-26 ENCOUNTER — Encounter (HOSPITAL_COMMUNITY)
Admission: RE | Admit: 2017-01-26 | Discharge: 2017-01-26 | Disposition: A | Discharge status: Home / Self Care | Payer: Self-pay | Source: Ambulatory Visit | Attending: Internal Medicine | Admitting: Internal Medicine

## 2017-01-28 ENCOUNTER — Encounter (HOSPITAL_COMMUNITY)
Admission: RE | Admit: 2017-01-28 | Discharge: 2017-01-28 | Disposition: A | Discharge status: Home / Self Care | Payer: BLUE CROSS/BLUE SHIELD | Source: Ambulatory Visit | Attending: Internal Medicine | Admitting: Internal Medicine

## 2017-01-31 ENCOUNTER — Encounter (HOSPITAL_COMMUNITY): Payer: Self-pay

## 2017-02-02 ENCOUNTER — Encounter (HOSPITAL_COMMUNITY): Payer: Self-pay

## 2017-02-04 ENCOUNTER — Telehealth (HOSPITAL_COMMUNITY): Payer: Self-pay | Admitting: *Deleted

## 2017-02-04 ENCOUNTER — Encounter (HOSPITAL_COMMUNITY): Payer: Self-pay

## 2017-02-07 ENCOUNTER — Encounter (HOSPITAL_COMMUNITY)
Admission: RE | Admit: 2017-02-07 | Discharge: 2017-02-07 | Disposition: A | Discharge status: Home / Self Care | Payer: Self-pay | Source: Ambulatory Visit | Attending: Internal Medicine | Admitting: Internal Medicine

## 2017-02-09 ENCOUNTER — Encounter (HOSPITAL_COMMUNITY)
Admission: RE | Admit: 2017-02-09 | Discharge: 2017-02-09 | Disposition: A | Discharge status: Home / Self Care | Payer: BLUE CROSS/BLUE SHIELD | Source: Ambulatory Visit | Attending: Internal Medicine | Admitting: Internal Medicine

## 2017-02-11 ENCOUNTER — Encounter (HOSPITAL_COMMUNITY)
Admission: RE | Admit: 2017-02-11 | Discharge: 2017-02-11 | Disposition: A | Discharge status: Home / Self Care | Payer: Self-pay | Source: Ambulatory Visit | Attending: Internal Medicine | Admitting: Internal Medicine

## 2017-02-14 ENCOUNTER — Encounter (HOSPITAL_COMMUNITY): Payer: Self-pay

## 2017-02-16 ENCOUNTER — Encounter (HOSPITAL_COMMUNITY)
Admission: RE | Admit: 2017-02-16 | Discharge: 2017-02-16 | Disposition: A | Discharge status: Home / Self Care | Payer: BLUE CROSS/BLUE SHIELD | Source: Ambulatory Visit | Attending: Internal Medicine | Admitting: Internal Medicine

## 2017-02-18 ENCOUNTER — Encounter (HOSPITAL_COMMUNITY)
Admission: RE | Admit: 2017-02-18 | Discharge: 2017-02-18 | Disposition: A | Discharge status: Home / Self Care | Payer: Self-pay | Source: Ambulatory Visit | Attending: Internal Medicine | Admitting: Internal Medicine

## 2017-02-21 ENCOUNTER — Encounter (HOSPITAL_COMMUNITY)
Admission: RE | Admit: 2017-02-21 | Discharge: 2017-02-21 | Disposition: A | Discharge status: Home / Self Care | Payer: Self-pay | Source: Ambulatory Visit | Attending: Internal Medicine | Admitting: Internal Medicine

## 2017-02-23 ENCOUNTER — Encounter (HOSPITAL_COMMUNITY)
Admission: RE | Admit: 2017-02-23 | Discharge: 2017-02-23 | Disposition: A | Discharge status: Home / Self Care | Payer: Self-pay | Source: Ambulatory Visit | Attending: Internal Medicine | Admitting: Internal Medicine

## 2017-02-23 DIAGNOSIS — I272 Pulmonary hypertension, unspecified: Secondary | ICD-10-CM | POA: Insufficient documentation

## 2017-02-23 DIAGNOSIS — M349 Systemic sclerosis, unspecified: Secondary | ICD-10-CM | POA: Insufficient documentation

## 2017-02-24 ENCOUNTER — Encounter: Payer: Self-pay | Admitting: Internal Medicine

## 2017-02-25 ENCOUNTER — Encounter (HOSPITAL_COMMUNITY)
Admission: RE | Admit: 2017-02-25 | Discharge: 2017-02-25 | Disposition: A | Discharge status: Home / Self Care | Payer: Self-pay | Source: Ambulatory Visit | Attending: Internal Medicine | Admitting: Internal Medicine

## 2017-02-28 ENCOUNTER — Encounter (HOSPITAL_COMMUNITY)
Admission: RE | Admit: 2017-02-28 | Discharge: 2017-02-28 | Disposition: A | Discharge status: Home / Self Care | Payer: Self-pay | Source: Ambulatory Visit | Attending: Internal Medicine | Admitting: Internal Medicine

## 2017-03-01 IMAGING — CR DG CHEST 1V PORT
1 series · 1 of 1 positions shown · non-contrast
Comparison: Chest radiograph and chest CT April 07, 2016

CLINICAL DATA: Lung collapse

EXAM:
PORTABLE CHEST 1 VIEW

[AP]
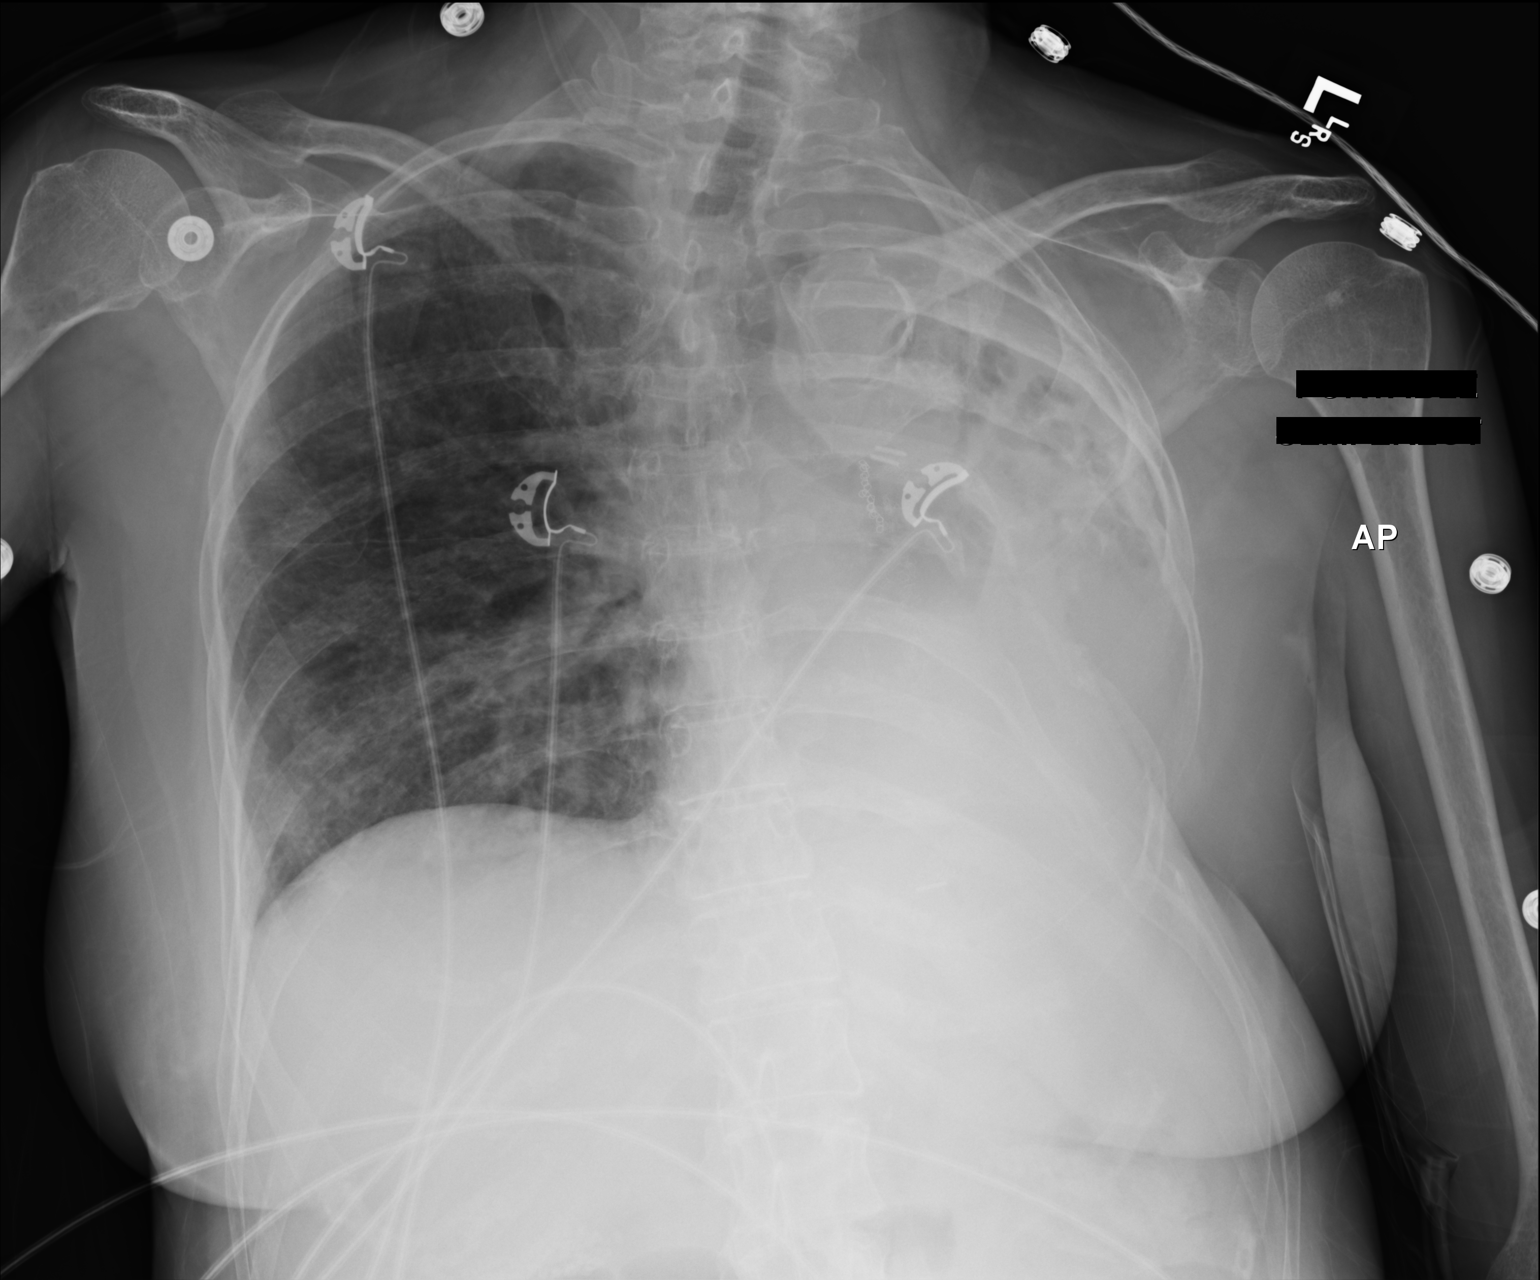

[1 of 1 positions shown; findings below may reference images not displayed]

FINDINGS: There is virtually complete collapse on the left. There is
postoperative change on the left. There is consolidation in
remaining left lung with what appears to be varicoid bronchiectasis
in the left upper lobe, better demonstrated on CT. The small
pneumothorax in the left base seen on CT 1 day prior is not evident
by radiography.

On the right, there is patchy atelectatic change. Bronchiectatic
change on the right is better appreciated by CT.

Cardiac silhouette is within normal limits. There is aortic
atherosclerosis. No adenopathy is evident on the right ; left side
is obscured, and assessment for adenopathy cannot be made by
radiography. There are postoperative rib defects on the left.
IMPRESSION: No appreciable change radiographically compared to 1 day prior.
Marked volume loss on the left with postoperative change in
consolidation. Varicoid bronchiectasis in the left upper lobe is
seen by radiography with better appreciated by CT. On the right,
there is patchy atelectasis and areas of bronchiectatic change,
better demonstrated by CT. No change in cardiac silhouette. There is
aortic atherosclerosis.

## 2017-03-02 ENCOUNTER — Encounter (HOSPITAL_COMMUNITY)
Admission: RE | Admit: 2017-03-02 | Discharge: 2017-03-02 | Disposition: A | Discharge status: Home / Self Care | Payer: Self-pay | Source: Ambulatory Visit | Attending: Internal Medicine | Admitting: Internal Medicine

## 2017-03-04 ENCOUNTER — Encounter (HOSPITAL_COMMUNITY)
Admission: RE | Admit: 2017-03-04 | Discharge: 2017-03-04 | Disposition: A | Discharge status: Home / Self Care | Payer: Self-pay | Source: Ambulatory Visit | Attending: Internal Medicine | Admitting: Internal Medicine

## 2017-03-07 ENCOUNTER — Encounter (HOSPITAL_COMMUNITY)
Admission: RE | Admit: 2017-03-07 | Discharge: 2017-03-07 | Disposition: A | Discharge status: Home / Self Care | Payer: Self-pay | Source: Ambulatory Visit | Attending: Internal Medicine | Admitting: Internal Medicine

## 2017-03-09 ENCOUNTER — Encounter (HOSPITAL_COMMUNITY)
Admission: RE | Admit: 2017-03-09 | Discharge: 2017-03-09 | Disposition: A | Discharge status: Home / Self Care | Payer: Self-pay | Source: Ambulatory Visit | Attending: Internal Medicine | Admitting: Internal Medicine

## 2017-03-11 ENCOUNTER — Encounter (HOSPITAL_COMMUNITY)
Admission: RE | Admit: 2017-03-11 | Discharge: 2017-03-11 | Disposition: A | Discharge status: Home / Self Care | Payer: Self-pay | Source: Ambulatory Visit | Attending: Internal Medicine | Admitting: Internal Medicine

## 2017-03-14 ENCOUNTER — Encounter (HOSPITAL_COMMUNITY)
Admission: RE | Admit: 2017-03-14 | Discharge: 2017-03-14 | Disposition: A | Discharge status: Home / Self Care | Payer: Self-pay | Source: Ambulatory Visit | Attending: Internal Medicine | Admitting: Internal Medicine

## 2017-03-16 ENCOUNTER — Encounter (HOSPITAL_COMMUNITY)
Admission: RE | Admit: 2017-03-16 | Discharge: 2017-03-16 | Disposition: A | Discharge status: Home / Self Care | Payer: Self-pay | Source: Ambulatory Visit | Attending: Internal Medicine | Admitting: Internal Medicine

## 2017-03-18 ENCOUNTER — Encounter (HOSPITAL_COMMUNITY)
Admission: RE | Admit: 2017-03-18 | Discharge: 2017-03-18 | Disposition: A | Discharge status: Home / Self Care | Payer: Self-pay | Source: Ambulatory Visit | Attending: Internal Medicine | Admitting: Internal Medicine

## 2017-03-21 ENCOUNTER — Encounter (HOSPITAL_COMMUNITY)
Admission: RE | Admit: 2017-03-21 | Discharge: 2017-03-21 | Disposition: A | Discharge status: Home / Self Care | Payer: Self-pay | Source: Ambulatory Visit | Attending: Internal Medicine | Admitting: Internal Medicine

## 2017-03-23 ENCOUNTER — Encounter (HOSPITAL_COMMUNITY)
Admission: RE | Admit: 2017-03-23 | Discharge: 2017-03-23 | Disposition: A | Discharge status: Home / Self Care | Payer: Self-pay | Source: Ambulatory Visit | Attending: Pulmonary Disease | Admitting: Pulmonary Disease

## 2017-03-25 ENCOUNTER — Encounter (HOSPITAL_COMMUNITY)
Admission: RE | Admit: 2017-03-25 | Discharge: 2017-03-25 | Disposition: A | Discharge status: Home / Self Care | Payer: Self-pay | Source: Ambulatory Visit | Attending: Pulmonary Disease | Admitting: Pulmonary Disease

## 2017-03-25 DIAGNOSIS — I272 Pulmonary hypertension, unspecified: Secondary | ICD-10-CM | POA: Insufficient documentation

## 2017-03-25 DIAGNOSIS — M349 Systemic sclerosis, unspecified: Secondary | ICD-10-CM | POA: Insufficient documentation

## 2017-03-28 ENCOUNTER — Encounter (HOSPITAL_COMMUNITY)
Admission: RE | Admit: 2017-03-28 | Discharge: 2017-03-28 | Disposition: A | Discharge status: Home / Self Care | Payer: Self-pay | Source: Ambulatory Visit | Attending: Pulmonary Disease | Admitting: Pulmonary Disease

## 2017-03-30 ENCOUNTER — Encounter (HOSPITAL_COMMUNITY)
Admission: RE | Admit: 2017-03-30 | Discharge: 2017-03-30 | Disposition: A | Discharge status: Home / Self Care | Payer: BLUE CROSS/BLUE SHIELD | Source: Ambulatory Visit | Attending: Pulmonary Disease | Admitting: Pulmonary Disease

## 2017-04-01 ENCOUNTER — Encounter (HOSPITAL_COMMUNITY)
Admission: RE | Admit: 2017-04-01 | Discharge: 2017-04-01 | Disposition: A | Discharge status: Home / Self Care | Payer: Self-pay | Source: Ambulatory Visit | Attending: Pulmonary Disease | Admitting: Pulmonary Disease

## 2017-04-04 ENCOUNTER — Encounter (HOSPITAL_COMMUNITY)
Admission: RE | Admit: 2017-04-04 | Discharge: 2017-04-04 | Disposition: A | Discharge status: Home / Self Care | Payer: Self-pay | Source: Ambulatory Visit | Attending: Pulmonary Disease | Admitting: Pulmonary Disease

## 2017-04-06 ENCOUNTER — Encounter (HOSPITAL_COMMUNITY)
Admission: RE | Admit: 2017-04-06 | Discharge: 2017-04-06 | Disposition: A | Discharge status: Home / Self Care | Payer: Self-pay | Source: Ambulatory Visit | Attending: Pulmonary Disease | Admitting: Pulmonary Disease

## 2017-04-08 ENCOUNTER — Encounter (HOSPITAL_COMMUNITY)
Admission: RE | Admit: 2017-04-08 | Discharge: 2017-04-08 | Disposition: A | Discharge status: Home / Self Care | Payer: Self-pay | Source: Ambulatory Visit | Attending: Pulmonary Disease | Admitting: Pulmonary Disease

## 2017-04-11 ENCOUNTER — Encounter (HOSPITAL_COMMUNITY)
Admission: RE | Admit: 2017-04-11 | Discharge: 2017-04-11 | Disposition: A | Discharge status: Home / Self Care | Payer: Self-pay | Source: Ambulatory Visit | Attending: Pulmonary Disease | Admitting: Pulmonary Disease

## 2017-04-13 ENCOUNTER — Encounter (HOSPITAL_COMMUNITY)
Admission: RE | Admit: 2017-04-13 | Discharge: 2017-04-13 | Disposition: A | Discharge status: Home / Self Care | Payer: Self-pay | Source: Ambulatory Visit | Attending: Pulmonary Disease | Admitting: Pulmonary Disease

## 2017-04-15 ENCOUNTER — Encounter (HOSPITAL_COMMUNITY)
Admission: RE | Admit: 2017-04-15 | Discharge: 2017-04-15 | Disposition: A | Discharge status: Home / Self Care | Payer: BLUE CROSS/BLUE SHIELD | Source: Ambulatory Visit | Attending: Pulmonary Disease | Admitting: Pulmonary Disease

## 2017-04-18 ENCOUNTER — Encounter (HOSPITAL_COMMUNITY)
Admission: RE | Admit: 2017-04-18 | Discharge: 2017-04-18 | Disposition: A | Discharge status: Home / Self Care | Payer: Self-pay | Source: Ambulatory Visit | Attending: Pulmonary Disease | Admitting: Pulmonary Disease

## 2017-04-20 ENCOUNTER — Encounter (HOSPITAL_COMMUNITY)
Admission: RE | Admit: 2017-04-20 | Discharge: 2017-04-20 | Disposition: A | Discharge status: Home / Self Care | Payer: Self-pay | Source: Ambulatory Visit | Attending: Pulmonary Disease | Admitting: Pulmonary Disease

## 2017-04-22 ENCOUNTER — Encounter (HOSPITAL_COMMUNITY)
Admission: RE | Admit: 2017-04-22 | Discharge: 2017-04-22 | Disposition: A | Discharge status: Home / Self Care | Payer: Self-pay | Source: Ambulatory Visit | Attending: Pulmonary Disease | Admitting: Pulmonary Disease

## 2017-04-22 DIAGNOSIS — I272 Pulmonary hypertension, unspecified: Secondary | ICD-10-CM | POA: Insufficient documentation

## 2017-04-22 DIAGNOSIS — M349 Systemic sclerosis, unspecified: Secondary | ICD-10-CM | POA: Insufficient documentation

## 2017-04-25 ENCOUNTER — Encounter (HOSPITAL_COMMUNITY)
Admission: RE | Admit: 2017-04-25 | Discharge: 2017-04-25 | Disposition: A | Discharge status: Home / Self Care | Payer: BLUE CROSS/BLUE SHIELD | Source: Ambulatory Visit | Attending: Pulmonary Disease | Admitting: Pulmonary Disease

## 2017-04-27 ENCOUNTER — Encounter (HOSPITAL_COMMUNITY): Payer: Self-pay

## 2017-04-29 ENCOUNTER — Encounter (HOSPITAL_COMMUNITY)
Admission: RE | Admit: 2017-04-29 | Discharge: 2017-04-29 | Disposition: A | Discharge status: Home / Self Care | Payer: BLUE CROSS/BLUE SHIELD | Source: Ambulatory Visit | Attending: Pulmonary Disease | Admitting: Pulmonary Disease

## 2017-05-02 ENCOUNTER — Encounter (HOSPITAL_COMMUNITY): Payer: Self-pay

## 2017-05-04 ENCOUNTER — Encounter (HOSPITAL_COMMUNITY)
Admission: RE | Admit: 2017-05-04 | Discharge: 2017-05-04 | Disposition: A | Discharge status: Home / Self Care | Payer: Self-pay | Source: Ambulatory Visit | Attending: Pulmonary Disease | Admitting: Pulmonary Disease

## 2017-05-05 ENCOUNTER — Ambulatory Visit (HOSPITAL_COMMUNITY)
Admission: RE | Admit: 2017-05-05 | Discharge: 2017-05-05 | Disposition: A | Discharge status: Home / Self Care | Payer: BLUE CROSS/BLUE SHIELD | Source: Ambulatory Visit | Attending: Internal Medicine | Admitting: Internal Medicine

## 2017-05-05 ENCOUNTER — Encounter (HOSPITAL_COMMUNITY): Payer: Self-pay

## 2017-05-05 ENCOUNTER — Ambulatory Visit (HOSPITAL_COMMUNITY): Payer: BLUE CROSS/BLUE SHIELD

## 2017-05-05 DIAGNOSIS — M810 Age-related osteoporosis without current pathological fracture: Secondary | ICD-10-CM | POA: Insufficient documentation

## 2017-05-05 MED ORDER — DENOSUMAB 60 MG/ML ~~LOC~~ SOLN
60.0000 mg | Freq: Once | SUBCUTANEOUS | Status: AC
  Administered 2017-05-05: 60 mg via SUBCUTANEOUS
  Filled 2017-05-05: qty 1

## 2017-05-05 NOTE — Discharge Instructions (Signed)
Denosumab injection /prolia °What is this medicine? °DENOSUMAB (den oh sue mab) slows bone breakdown. Prolia is used to treat osteoporosis in women after menopause and in men. Xgeva is used to treat a high calcium level due to cancer and to prevent bone fractures and other bone problems caused by multiple myeloma or cancer bone metastases. Xgeva is also used to treat giant cell tumor of the bone. °This medicine may be used for other purposes; ask your health care provider or pharmacist if you have questions. °COMMON BRAND NAME(S): Prolia, XGEVA °What should I tell my health care provider before I take this medicine? °They need to know if you have any of these conditions: °-dental disease °-having surgery or tooth extraction °-infection °-kidney disease °-low levels of calcium or Vitamin D in the blood °-malnutrition °-on hemodialysis °-skin conditions or sensitivity °-thyroid or parathyroid disease °-an unusual reaction to denosumab, other medicines, foods, dyes, or preservatives °-pregnant or trying to get pregnant °-breast-feeding °How should I use this medicine? °This medicine is for injection under the skin. It is given by a health care professional in a hospital or clinic setting. °If you are getting Prolia, a special MedGuide will be given to you by the pharmacist with each prescription and refill. Be sure to read this information carefully each time. °For Prolia, talk to your pediatrician regarding the use of this medicine in children. Special care may be needed. For Xgeva, talk to your pediatrician regarding the use of this medicine in children. While this drug may be prescribed for children as young as 13 years for selected conditions, precautions do apply. °Overdosage: If you think you have taken too much of this medicine contact a poison control center or emergency room at once. °NOTE: This medicine is only for you. Do not share this medicine with others. °What if I miss a dose? °It is important not to  miss your dose. Call your doctor or health care professional if you are unable to keep an appointment. °What may interact with this medicine? °Do not take this medicine with any of the following medications: °-other medicines containing denosumab °This medicine may also interact with the following medications: °-medicines that lower your chance of fighting infection °-steroid medicines like prednisone or cortisone °This list may not describe all possible interactions. Give your health care provider a list of all the medicines, herbs, non-prescription drugs, or dietary supplements you use. Also tell them if you smoke, drink alcohol, or use illegal drugs. Some items may interact with your medicine. °What should I watch for while using this medicine? °Visit your doctor or health care professional for regular checks on your progress. Your doctor or health care professional may order blood tests and other tests to see how you are doing. °Call your doctor or health care professional for advice if you get a fever, chills or sore throat, or other symptoms of a cold or flu. Do not treat yourself. This drug may decrease your body's ability to fight infection. Try to avoid being around people who are sick. °You should make sure you get enough calcium and vitamin D while you are taking this medicine, unless your doctor tells you not to. Discuss the foods you eat and the vitamins you take with your health care professional. °See your dentist regularly. Brush and floss your teeth as directed. Before you have any dental work done, tell your dentist you are receiving this medicine. °Do not become pregnant while taking this medicine or for 5 months after   after stopping it. Talk with your doctor or health care professional about your birth control options while taking this medicine. Women should inform their doctor if they wish to become pregnant or think they might be pregnant. There is a potential for serious side effects to an unborn  child. Talk to your health care professional or pharmacist for more information. What side effects may I notice from receiving this medicine? Side effects that you should report to your doctor or health care professional as soon as possible: -allergic reactions like skin rash, itching or hives, swelling of the face, lips, or tongue -bone pain -breathing problems -dizziness -jaw pain, especially after dental work -redness, blistering, peeling of the skin -signs and symptoms of infection like fever or chills; cough; sore throat; pain or trouble passing urine -signs of low calcium like fast heartbeat, muscle cramps or muscle pain; pain, tingling, numbness in the hands or feet; seizures -unusual bleeding or bruising -unusually weak or tired Side effects that usually do not require medical attention (report to your doctor or health care professional if they continue or are bothersome): -constipation -diarrhea -headache -joint pain -loss of appetite -muscle pain -runny nose -tiredness -upset stomach This list may not describe all possible side effects. Call your doctor for medical advice about side effects. You may report side effects to FDA at 1-800-FDA-1088. Where should I keep my medicine? This medicine is only given in a clinic, doctor's office, or other health care setting and will not be stored at home. NOTE: This sheet is a summary. It may not cover all possible information. If you have questions about this medicine, talk to your doctor, pharmacist, or health care provider.  2018 Elsevier/Gold Standard (2016-03-02 19:17:21)

## 2017-05-06 ENCOUNTER — Encounter (HOSPITAL_COMMUNITY)
Admission: RE | Admit: 2017-05-06 | Discharge: 2017-05-06 | Disposition: A | Discharge status: Home / Self Care | Payer: BLUE CROSS/BLUE SHIELD | Source: Ambulatory Visit | Attending: Pulmonary Disease | Admitting: Pulmonary Disease

## 2017-05-09 ENCOUNTER — Encounter (HOSPITAL_COMMUNITY)
Admission: RE | Admit: 2017-05-09 | Discharge: 2017-05-09 | Disposition: A | Discharge status: Home / Self Care | Payer: Self-pay | Source: Ambulatory Visit | Attending: Pulmonary Disease | Admitting: Pulmonary Disease

## 2017-05-11 ENCOUNTER — Encounter (HOSPITAL_COMMUNITY): Payer: Self-pay

## 2017-05-13 ENCOUNTER — Encounter (HOSPITAL_COMMUNITY)
Admission: RE | Admit: 2017-05-13 | Discharge: 2017-05-13 | Disposition: A | Discharge status: Home / Self Care | Payer: Self-pay | Source: Ambulatory Visit | Attending: Pulmonary Disease | Admitting: Pulmonary Disease

## 2017-05-16 ENCOUNTER — Encounter (HOSPITAL_COMMUNITY)
Admission: RE | Admit: 2017-05-16 | Discharge: 2017-05-16 | Disposition: A | Discharge status: Home / Self Care | Payer: Self-pay | Source: Ambulatory Visit | Attending: Pulmonary Disease | Admitting: Pulmonary Disease

## 2017-05-18 ENCOUNTER — Encounter (HOSPITAL_COMMUNITY)
Admission: RE | Admit: 2017-05-18 | Discharge: 2017-05-18 | Disposition: A | Discharge status: Home / Self Care | Payer: Self-pay | Source: Ambulatory Visit | Attending: Pulmonary Disease | Admitting: Pulmonary Disease

## 2017-05-20 ENCOUNTER — Encounter (HOSPITAL_COMMUNITY)
Admission: RE | Admit: 2017-05-20 | Discharge: 2017-05-20 | Disposition: A | Discharge status: Home / Self Care | Payer: BLUE CROSS/BLUE SHIELD | Source: Ambulatory Visit | Attending: Pulmonary Disease | Admitting: Pulmonary Disease

## 2017-05-23 ENCOUNTER — Encounter (HOSPITAL_COMMUNITY)
Admission: RE | Admit: 2017-05-23 | Discharge: 2017-05-23 | Disposition: A | Discharge status: Home / Self Care | Payer: Self-pay | Source: Ambulatory Visit | Attending: Pulmonary Disease | Admitting: Pulmonary Disease

## 2017-05-23 DIAGNOSIS — I272 Pulmonary hypertension, unspecified: Secondary | ICD-10-CM | POA: Insufficient documentation

## 2017-05-23 DIAGNOSIS — M349 Systemic sclerosis, unspecified: Secondary | ICD-10-CM | POA: Insufficient documentation

## 2017-05-25 ENCOUNTER — Encounter (HOSPITAL_COMMUNITY)
Admission: RE | Admit: 2017-05-25 | Discharge: 2017-05-25 | Disposition: A | Discharge status: Home / Self Care | Payer: BLUE CROSS/BLUE SHIELD | Source: Ambulatory Visit | Attending: Pulmonary Disease | Admitting: Pulmonary Disease

## 2017-05-27 ENCOUNTER — Encounter (HOSPITAL_COMMUNITY)
Admission: RE | Admit: 2017-05-27 | Discharge: 2017-05-27 | Disposition: A | Discharge status: Home / Self Care | Payer: BLUE CROSS/BLUE SHIELD | Source: Ambulatory Visit | Attending: Pulmonary Disease | Admitting: Pulmonary Disease

## 2017-05-30 ENCOUNTER — Encounter (HOSPITAL_COMMUNITY)
Admission: RE | Admit: 2017-05-30 | Discharge: 2017-05-30 | Disposition: A | Discharge status: Home / Self Care | Payer: Self-pay | Source: Ambulatory Visit | Attending: Pulmonary Disease | Admitting: Pulmonary Disease

## 2017-06-01 ENCOUNTER — Encounter (HOSPITAL_COMMUNITY)
Admission: RE | Admit: 2017-06-01 | Discharge: 2017-06-01 | Disposition: A | Discharge status: Home / Self Care | Payer: Self-pay | Source: Ambulatory Visit | Attending: Pulmonary Disease | Admitting: Pulmonary Disease

## 2017-06-03 ENCOUNTER — Encounter (HOSPITAL_COMMUNITY)
Admission: RE | Admit: 2017-06-03 | Discharge: 2017-06-03 | Disposition: A | Discharge status: Home / Self Care | Payer: Self-pay | Source: Ambulatory Visit | Attending: Pulmonary Disease | Admitting: Pulmonary Disease

## 2017-06-06 ENCOUNTER — Encounter (HOSPITAL_COMMUNITY)
Admission: RE | Admit: 2017-06-06 | Discharge: 2017-06-06 | Disposition: A | Discharge status: Home / Self Care | Payer: Self-pay | Source: Ambulatory Visit | Attending: Pulmonary Disease | Admitting: Pulmonary Disease

## 2017-06-08 ENCOUNTER — Encounter (HOSPITAL_COMMUNITY)
Admission: RE | Admit: 2017-06-08 | Discharge: 2017-06-08 | Disposition: A | Discharge status: Home / Self Care | Payer: Self-pay | Source: Ambulatory Visit | Attending: Pulmonary Disease | Admitting: Pulmonary Disease

## 2017-06-10 ENCOUNTER — Encounter (HOSPITAL_COMMUNITY)
Admission: RE | Admit: 2017-06-10 | Discharge: 2017-06-10 | Disposition: A | Discharge status: Home / Self Care | Payer: Self-pay | Source: Ambulatory Visit | Attending: Pulmonary Disease | Admitting: Pulmonary Disease

## 2017-06-13 ENCOUNTER — Encounter (HOSPITAL_COMMUNITY)
Admission: RE | Admit: 2017-06-13 | Discharge: 2017-06-13 | Disposition: A | Discharge status: Home / Self Care | Payer: BLUE CROSS/BLUE SHIELD | Source: Ambulatory Visit | Attending: Pulmonary Disease | Admitting: Pulmonary Disease

## 2017-06-15 ENCOUNTER — Telehealth (HOSPITAL_COMMUNITY): Payer: Self-pay

## 2017-06-15 ENCOUNTER — Encounter (HOSPITAL_COMMUNITY): Payer: Self-pay

## 2017-06-15 NOTE — Telephone Encounter (Signed)
Patient called and stated she will not be attending class today as her stomach hurts.

## 2017-06-17 ENCOUNTER — Encounter (HOSPITAL_COMMUNITY)
Admission: RE | Admit: 2017-06-17 | Discharge: 2017-06-17 | Disposition: A | Discharge status: Home / Self Care | Payer: BLUE CROSS/BLUE SHIELD | Source: Ambulatory Visit | Attending: Pulmonary Disease | Admitting: Pulmonary Disease

## 2017-06-20 ENCOUNTER — Encounter (HOSPITAL_COMMUNITY)
Admission: RE | Admit: 2017-06-20 | Discharge: 2017-06-20 | Disposition: A | Discharge status: Home / Self Care | Payer: Self-pay | Source: Ambulatory Visit | Attending: Pulmonary Disease | Admitting: Pulmonary Disease

## 2017-06-22 ENCOUNTER — Encounter (HOSPITAL_COMMUNITY)
Admission: RE | Admit: 2017-06-22 | Discharge: 2017-06-22 | Disposition: A | Discharge status: Home / Self Care | Payer: Self-pay | Source: Ambulatory Visit | Attending: Pulmonary Disease | Admitting: Pulmonary Disease

## 2017-06-22 DIAGNOSIS — M349 Systemic sclerosis, unspecified: Secondary | ICD-10-CM | POA: Insufficient documentation

## 2017-06-22 DIAGNOSIS — I272 Pulmonary hypertension, unspecified: Secondary | ICD-10-CM | POA: Insufficient documentation

## 2017-06-24 ENCOUNTER — Encounter (HOSPITAL_COMMUNITY)
Admission: RE | Admit: 2017-06-24 | Discharge: 2017-06-24 | Disposition: A | Discharge status: Home / Self Care | Payer: Self-pay | Source: Ambulatory Visit | Attending: Pulmonary Disease | Admitting: Pulmonary Disease

## 2017-06-27 ENCOUNTER — Encounter (HOSPITAL_COMMUNITY)
Admission: RE | Admit: 2017-06-27 | Discharge: 2017-06-27 | Disposition: A | Discharge status: Home / Self Care | Payer: Self-pay | Source: Ambulatory Visit | Attending: Pulmonary Disease | Admitting: Pulmonary Disease

## 2017-06-29 ENCOUNTER — Encounter (HOSPITAL_COMMUNITY)
Admission: RE | Admit: 2017-06-29 | Discharge: 2017-06-29 | Disposition: A | Discharge status: Home / Self Care | Payer: Self-pay | Source: Ambulatory Visit | Attending: Pulmonary Disease | Admitting: Pulmonary Disease

## 2017-07-01 ENCOUNTER — Encounter (HOSPITAL_COMMUNITY)
Admission: RE | Admit: 2017-07-01 | Discharge: 2017-07-01 | Disposition: A | Discharge status: Home / Self Care | Payer: Self-pay | Source: Ambulatory Visit | Attending: Pulmonary Disease | Admitting: Pulmonary Disease

## 2017-07-04 ENCOUNTER — Encounter (HOSPITAL_COMMUNITY)
Admission: RE | Admit: 2017-07-04 | Discharge: 2017-07-04 | Disposition: A | Discharge status: Home / Self Care | Payer: Self-pay | Source: Ambulatory Visit | Attending: Pulmonary Disease | Admitting: Pulmonary Disease

## 2017-07-06 ENCOUNTER — Encounter (HOSPITAL_COMMUNITY)
Admission: RE | Admit: 2017-07-06 | Discharge: 2017-07-06 | Disposition: A | Discharge status: Home / Self Care | Payer: Self-pay | Source: Ambulatory Visit | Attending: Pulmonary Disease | Admitting: Pulmonary Disease

## 2017-07-08 ENCOUNTER — Encounter (HOSPITAL_COMMUNITY)
Admission: RE | Admit: 2017-07-08 | Discharge: 2017-07-08 | Disposition: A | Discharge status: Home / Self Care | Payer: BLUE CROSS/BLUE SHIELD | Source: Ambulatory Visit | Attending: Pulmonary Disease | Admitting: Pulmonary Disease

## 2017-07-11 ENCOUNTER — Encounter (HOSPITAL_COMMUNITY)
Admission: RE | Admit: 2017-07-11 | Discharge: 2017-07-11 | Disposition: A | Discharge status: Home / Self Care | Payer: Self-pay | Source: Ambulatory Visit | Attending: Pulmonary Disease | Admitting: Pulmonary Disease

## 2017-07-13 ENCOUNTER — Encounter (HOSPITAL_COMMUNITY)
Admission: RE | Admit: 2017-07-13 | Discharge: 2017-07-13 | Disposition: A | Discharge status: Home / Self Care | Payer: Self-pay | Source: Ambulatory Visit | Attending: Pulmonary Disease | Admitting: Pulmonary Disease

## 2017-07-15 ENCOUNTER — Encounter (HOSPITAL_COMMUNITY)
Admission: RE | Admit: 2017-07-15 | Discharge: 2017-07-15 | Disposition: A | Discharge status: Home / Self Care | Payer: Self-pay | Source: Ambulatory Visit | Attending: Pulmonary Disease | Admitting: Pulmonary Disease

## 2017-07-20 ENCOUNTER — Encounter (HOSPITAL_COMMUNITY)
Admission: RE | Admit: 2017-07-20 | Discharge: 2017-07-20 | Disposition: A | Discharge status: Home / Self Care | Payer: BLUE CROSS/BLUE SHIELD | Source: Ambulatory Visit | Attending: Pulmonary Disease | Admitting: Pulmonary Disease

## 2017-07-22 ENCOUNTER — Encounter (HOSPITAL_COMMUNITY)
Admission: RE | Admit: 2017-07-22 | Discharge: 2017-07-22 | Disposition: A | Discharge status: Home / Self Care | Payer: BLUE CROSS/BLUE SHIELD | Source: Ambulatory Visit | Attending: Pulmonary Disease | Admitting: Pulmonary Disease

## 2017-07-25 ENCOUNTER — Encounter (HOSPITAL_COMMUNITY)
Admission: RE | Admit: 2017-07-25 | Discharge: 2017-07-25 | Disposition: A | Discharge status: Home / Self Care | Payer: Self-pay | Source: Ambulatory Visit | Attending: Pulmonary Disease | Admitting: Pulmonary Disease

## 2017-07-25 DIAGNOSIS — M349 Systemic sclerosis, unspecified: Secondary | ICD-10-CM | POA: Insufficient documentation

## 2017-07-25 DIAGNOSIS — I272 Pulmonary hypertension, unspecified: Secondary | ICD-10-CM | POA: Insufficient documentation

## 2017-07-27 ENCOUNTER — Encounter (HOSPITAL_COMMUNITY): Payer: Self-pay

## 2017-07-29 ENCOUNTER — Encounter (HOSPITAL_COMMUNITY): Payer: Self-pay

## 2017-08-01 ENCOUNTER — Encounter (HOSPITAL_COMMUNITY)
Admission: RE | Admit: 2017-08-01 | Discharge: 2017-08-01 | Disposition: A | Discharge status: Home / Self Care | Payer: Self-pay | Source: Ambulatory Visit | Attending: Pulmonary Disease | Admitting: Pulmonary Disease

## 2017-08-03 ENCOUNTER — Encounter (HOSPITAL_COMMUNITY)
Admission: RE | Admit: 2017-08-03 | Discharge: 2017-08-03 | Disposition: A | Discharge status: Home / Self Care | Payer: Self-pay | Source: Ambulatory Visit | Attending: Pulmonary Disease | Admitting: Pulmonary Disease

## 2017-08-05 ENCOUNTER — Encounter (HOSPITAL_COMMUNITY)
Admission: RE | Admit: 2017-08-05 | Discharge: 2017-08-05 | Disposition: A | Discharge status: Home / Self Care | Payer: Self-pay | Source: Ambulatory Visit | Attending: Pulmonary Disease | Admitting: Pulmonary Disease

## 2017-08-08 ENCOUNTER — Encounter (HOSPITAL_COMMUNITY)
Admission: RE | Admit: 2017-08-08 | Discharge: 2017-08-08 | Disposition: A | Discharge status: Home / Self Care | Payer: Self-pay | Source: Ambulatory Visit | Attending: Pulmonary Disease | Admitting: Pulmonary Disease

## 2017-08-10 ENCOUNTER — Encounter (HOSPITAL_COMMUNITY)
Admission: RE | Admit: 2017-08-10 | Discharge: 2017-08-10 | Disposition: A | Discharge status: Home / Self Care | Payer: Self-pay | Source: Ambulatory Visit | Attending: Emergency Medicine | Admitting: Emergency Medicine

## 2017-08-12 ENCOUNTER — Encounter (HOSPITAL_COMMUNITY)
Admission: RE | Admit: 2017-08-12 | Discharge: 2017-08-12 | Disposition: A | Discharge status: Home / Self Care | Payer: Self-pay | Source: Ambulatory Visit | Attending: Pulmonary Disease | Admitting: Pulmonary Disease

## 2017-08-15 ENCOUNTER — Encounter (HOSPITAL_COMMUNITY)
Admission: RE | Admit: 2017-08-15 | Discharge: 2017-08-15 | Disposition: A | Discharge status: Home / Self Care | Payer: Self-pay | Source: Ambulatory Visit | Attending: Emergency Medicine | Admitting: Emergency Medicine

## 2017-08-17 ENCOUNTER — Encounter (HOSPITAL_COMMUNITY)
Admission: RE | Admit: 2017-08-17 | Discharge: 2017-08-17 | Disposition: A | Discharge status: Home / Self Care | Payer: Self-pay | Source: Ambulatory Visit | Attending: Emergency Medicine | Admitting: Emergency Medicine

## 2017-08-19 ENCOUNTER — Encounter (HOSPITAL_COMMUNITY)
Admission: RE | Admit: 2017-08-19 | Discharge: 2017-08-19 | Disposition: A | Discharge status: Home / Self Care | Payer: Self-pay | Source: Ambulatory Visit | Attending: Emergency Medicine | Admitting: Emergency Medicine

## 2017-08-22 ENCOUNTER — Encounter (HOSPITAL_COMMUNITY)
Admission: RE | Admit: 2017-08-22 | Discharge: 2017-08-22 | Disposition: A | Discharge status: Home / Self Care | Payer: Self-pay | Source: Ambulatory Visit | Attending: Emergency Medicine | Admitting: Emergency Medicine

## 2017-08-22 DIAGNOSIS — I272 Pulmonary hypertension, unspecified: Secondary | ICD-10-CM | POA: Insufficient documentation

## 2017-08-22 DIAGNOSIS — M349 Systemic sclerosis, unspecified: Secondary | ICD-10-CM | POA: Insufficient documentation

## 2017-08-26 ENCOUNTER — Encounter (HOSPITAL_COMMUNITY)
Admission: RE | Admit: 2017-08-26 | Discharge: 2017-08-26 | Disposition: A | Discharge status: Home / Self Care | Payer: Self-pay | Source: Ambulatory Visit | Attending: Emergency Medicine | Admitting: Emergency Medicine

## 2017-08-29 ENCOUNTER — Encounter (HOSPITAL_COMMUNITY)
Admission: RE | Admit: 2017-08-29 | Discharge: 2017-08-29 | Disposition: A | Discharge status: Home / Self Care | Payer: Self-pay | Source: Ambulatory Visit | Attending: Emergency Medicine | Admitting: Emergency Medicine

## 2017-08-31 ENCOUNTER — Encounter (HOSPITAL_COMMUNITY)
Admission: RE | Admit: 2017-08-31 | Discharge: 2017-08-31 | Disposition: A | Discharge status: Home / Self Care | Payer: Self-pay | Source: Ambulatory Visit | Attending: Emergency Medicine | Admitting: Emergency Medicine

## 2017-09-05 ENCOUNTER — Encounter (HOSPITAL_COMMUNITY)
Admission: RE | Admit: 2017-09-05 | Discharge: 2017-09-05 | Disposition: A | Discharge status: Home / Self Care | Payer: Self-pay | Source: Ambulatory Visit | Attending: Emergency Medicine | Admitting: Emergency Medicine

## 2017-09-07 ENCOUNTER — Encounter (HOSPITAL_COMMUNITY)
Admission: RE | Admit: 2017-09-07 | Discharge: 2017-09-07 | Disposition: A | Discharge status: Home / Self Care | Payer: Self-pay | Source: Ambulatory Visit | Attending: Emergency Medicine | Admitting: Emergency Medicine

## 2017-09-12 ENCOUNTER — Encounter (HOSPITAL_COMMUNITY)
Admission: RE | Admit: 2017-09-12 | Discharge: 2017-09-12 | Disposition: A | Discharge status: Home / Self Care | Payer: Self-pay | Source: Ambulatory Visit | Attending: Emergency Medicine | Admitting: Emergency Medicine

## 2017-09-16 ENCOUNTER — Encounter (HOSPITAL_COMMUNITY)
Admission: RE | Admit: 2017-09-16 | Discharge: 2017-09-16 | Disposition: A | Discharge status: Home / Self Care | Payer: Self-pay | Source: Ambulatory Visit | Attending: Emergency Medicine | Admitting: Emergency Medicine

## 2017-09-19 ENCOUNTER — Encounter (HOSPITAL_COMMUNITY)
Admission: RE | Admit: 2017-09-19 | Discharge: 2017-09-19 | Disposition: A | Discharge status: Home / Self Care | Payer: Self-pay | Source: Ambulatory Visit | Attending: Emergency Medicine | Admitting: Emergency Medicine

## 2017-09-21 ENCOUNTER — Encounter (HOSPITAL_COMMUNITY)
Admission: RE | Admit: 2017-09-21 | Discharge: 2017-09-21 | Disposition: A | Discharge status: Home / Self Care | Payer: Self-pay | Source: Ambulatory Visit | Attending: Emergency Medicine | Admitting: Emergency Medicine

## 2017-09-23 ENCOUNTER — Encounter (HOSPITAL_COMMUNITY)
Admission: RE | Admit: 2017-09-23 | Discharge: 2017-09-23 | Disposition: A | Discharge status: Home / Self Care | Payer: Self-pay | Source: Ambulatory Visit | Attending: Emergency Medicine | Admitting: Emergency Medicine

## 2017-09-23 DIAGNOSIS — M349 Systemic sclerosis, unspecified: Secondary | ICD-10-CM | POA: Insufficient documentation

## 2017-09-23 DIAGNOSIS — I272 Pulmonary hypertension, unspecified: Secondary | ICD-10-CM | POA: Insufficient documentation

## 2017-09-26 ENCOUNTER — Encounter (HOSPITAL_COMMUNITY)
Admission: RE | Admit: 2017-09-26 | Discharge: 2017-09-26 | Disposition: A | Discharge status: Home / Self Care | Payer: Self-pay | Source: Ambulatory Visit | Attending: Pulmonary Disease | Admitting: Pulmonary Disease

## 2017-09-28 ENCOUNTER — Encounter (HOSPITAL_COMMUNITY)
Admission: RE | Admit: 2017-09-28 | Discharge: 2017-09-28 | Disposition: A | Discharge status: Home / Self Care | Payer: Self-pay | Source: Ambulatory Visit | Attending: Emergency Medicine | Admitting: Emergency Medicine

## 2017-09-30 ENCOUNTER — Encounter (HOSPITAL_COMMUNITY)
Admission: RE | Admit: 2017-09-30 | Discharge: 2017-09-30 | Disposition: A | Discharge status: Home / Self Care | Payer: Self-pay | Source: Ambulatory Visit | Attending: Emergency Medicine | Admitting: Emergency Medicine

## 2017-10-03 ENCOUNTER — Encounter (HOSPITAL_COMMUNITY)
Admission: RE | Admit: 2017-10-03 | Discharge: 2017-10-03 | Disposition: A | Discharge status: Home / Self Care | Payer: Self-pay | Source: Ambulatory Visit | Attending: Emergency Medicine | Admitting: Emergency Medicine

## 2017-10-05 ENCOUNTER — Encounter (HOSPITAL_COMMUNITY)
Admission: RE | Admit: 2017-10-05 | Discharge: 2017-10-05 | Disposition: A | Discharge status: Home / Self Care | Payer: Self-pay | Source: Ambulatory Visit | Attending: Emergency Medicine | Admitting: Emergency Medicine

## 2017-10-07 ENCOUNTER — Encounter (HOSPITAL_COMMUNITY)
Admission: RE | Admit: 2017-10-07 | Discharge: 2017-10-07 | Disposition: A | Discharge status: Home / Self Care | Payer: Self-pay | Source: Ambulatory Visit | Attending: Emergency Medicine | Admitting: Emergency Medicine

## 2017-10-10 ENCOUNTER — Encounter (HOSPITAL_COMMUNITY)
Admission: RE | Admit: 2017-10-10 | Discharge: 2017-10-10 | Disposition: A | Discharge status: Home / Self Care | Payer: Self-pay | Source: Ambulatory Visit | Attending: Emergency Medicine | Admitting: Emergency Medicine

## 2017-10-12 ENCOUNTER — Encounter (HOSPITAL_COMMUNITY)
Admission: RE | Admit: 2017-10-12 | Discharge: 2017-10-12 | Disposition: A | Discharge status: Home / Self Care | Payer: Self-pay | Source: Ambulatory Visit | Attending: Emergency Medicine | Admitting: Emergency Medicine

## 2017-10-14 ENCOUNTER — Encounter (HOSPITAL_COMMUNITY)
Admission: RE | Admit: 2017-10-14 | Discharge: 2017-10-14 | Disposition: A | Discharge status: Home / Self Care | Payer: Self-pay | Source: Ambulatory Visit | Attending: Emergency Medicine | Admitting: Emergency Medicine

## 2017-10-17 ENCOUNTER — Encounter (HOSPITAL_COMMUNITY)
Admission: RE | Admit: 2017-10-17 | Discharge: 2017-10-17 | Disposition: A | Discharge status: Home / Self Care | Payer: Self-pay | Source: Ambulatory Visit | Attending: Emergency Medicine | Admitting: Emergency Medicine

## 2017-10-19 ENCOUNTER — Encounter (HOSPITAL_COMMUNITY)
Admission: RE | Admit: 2017-10-19 | Discharge: 2017-10-19 | Disposition: A | Discharge status: Home / Self Care | Payer: Self-pay | Source: Ambulatory Visit | Attending: Emergency Medicine | Admitting: Emergency Medicine

## 2017-10-21 ENCOUNTER — Encounter (HOSPITAL_COMMUNITY)
Admission: RE | Admit: 2017-10-21 | Discharge: 2017-10-21 | Disposition: A | Discharge status: Home / Self Care | Payer: Self-pay | Source: Ambulatory Visit | Attending: Emergency Medicine | Admitting: Emergency Medicine

## 2017-10-26 ENCOUNTER — Encounter (HOSPITAL_COMMUNITY)
Admission: RE | Admit: 2017-10-26 | Discharge: 2017-10-26 | Disposition: A | Discharge status: Home / Self Care | Payer: Self-pay | Source: Ambulatory Visit | Attending: Emergency Medicine | Admitting: Emergency Medicine

## 2017-10-26 DIAGNOSIS — M349 Systemic sclerosis, unspecified: Secondary | ICD-10-CM | POA: Insufficient documentation

## 2017-10-26 DIAGNOSIS — I272 Pulmonary hypertension, unspecified: Secondary | ICD-10-CM | POA: Insufficient documentation

## 2017-10-28 ENCOUNTER — Encounter (HOSPITAL_COMMUNITY)
Admission: RE | Admit: 2017-10-28 | Discharge: 2017-10-28 | Disposition: A | Discharge status: Home / Self Care | Payer: Self-pay | Source: Ambulatory Visit | Attending: Emergency Medicine | Admitting: Emergency Medicine

## 2017-10-31 ENCOUNTER — Encounter (HOSPITAL_COMMUNITY)
Admission: RE | Admit: 2017-10-31 | Discharge: 2017-10-31 | Disposition: A | Discharge status: Home / Self Care | Payer: BLUE CROSS/BLUE SHIELD | Source: Ambulatory Visit | Attending: Emergency Medicine | Admitting: Emergency Medicine

## 2017-11-02 ENCOUNTER — Encounter (HOSPITAL_COMMUNITY)
Admission: RE | Admit: 2017-11-02 | Discharge: 2017-11-02 | Disposition: A | Discharge status: Home / Self Care | Payer: Self-pay | Source: Ambulatory Visit | Attending: Emergency Medicine | Admitting: Emergency Medicine

## 2017-11-04 ENCOUNTER — Encounter (HOSPITAL_COMMUNITY)
Admission: RE | Admit: 2017-11-04 | Discharge: 2017-11-04 | Disposition: A | Discharge status: Home / Self Care | Payer: Self-pay | Source: Ambulatory Visit | Attending: Emergency Medicine | Admitting: Emergency Medicine

## 2017-11-07 ENCOUNTER — Encounter (HOSPITAL_COMMUNITY)
Admission: RE | Admit: 2017-11-07 | Discharge: 2017-11-07 | Disposition: A | Discharge status: Home / Self Care | Payer: Self-pay | Source: Ambulatory Visit | Attending: Emergency Medicine | Admitting: Emergency Medicine

## 2017-11-08 ENCOUNTER — Encounter (HOSPITAL_COMMUNITY): Payer: Self-pay

## 2017-11-08 ENCOUNTER — Ambulatory Visit (HOSPITAL_COMMUNITY)
Admission: RE | Admit: 2017-11-08 | Discharge: 2017-11-08 | Disposition: A | Discharge status: Home / Self Care | Payer: Medicare Other | Source: Ambulatory Visit | Attending: Internal Medicine | Admitting: Internal Medicine

## 2017-11-08 DIAGNOSIS — M810 Age-related osteoporosis without current pathological fracture: Secondary | ICD-10-CM | POA: Diagnosis present

## 2017-11-08 MED ORDER — DENOSUMAB 60 MG/ML ~~LOC~~ SOSY
60.0000 mg | PREFILLED_SYRINGE | Freq: Once | SUBCUTANEOUS | Status: AC
  Administered 2017-11-08: 60 mg via SUBCUTANEOUS
  Filled 2017-11-08: qty 1

## 2017-11-08 NOTE — Discharge Instructions (Signed)
Denosumab injection °What is this medicine? °DENOSUMAB (den oh sue mab) slows bone breakdown. Prolia is used to treat osteoporosis in women after menopause and in men. Xgeva is used to treat a high calcium level due to cancer and to prevent bone fractures and other bone problems caused by multiple myeloma or cancer bone metastases. Xgeva is also used to treat giant cell tumor of the bone. °This medicine may be used for other purposes; ask your health care provider or pharmacist if you have questions. °COMMON BRAND NAME(S): Prolia, XGEVA °What should I tell my health care provider before I take this medicine? °They need to know if you have any of these conditions: °-dental disease °-having surgery or tooth extraction °-infection °-kidney disease °-low levels of calcium or Vitamin D in the blood °-malnutrition °-on hemodialysis °-skin conditions or sensitivity °-thyroid or parathyroid disease °-an unusual reaction to denosumab, other medicines, foods, dyes, or preservatives °-pregnant or trying to get pregnant °-breast-feeding °How should I use this medicine? °This medicine is for injection under the skin. It is given by a health care professional in a hospital or clinic setting. °If you are getting Prolia, a special MedGuide will be given to you by the pharmacist with each prescription and refill. Be sure to read this information carefully each time. °For Prolia, talk to your pediatrician regarding the use of this medicine in children. Special care may be needed. For Xgeva, talk to your pediatrician regarding the use of this medicine in children. While this drug may be prescribed for children as young as 13 years for selected conditions, precautions do apply. °Overdosage: If you think you have taken too much of this medicine contact a poison control center or emergency room at once. °NOTE: This medicine is only for you. Do not share this medicine with others. °What if I miss a dose? °It is important not to miss your  dose. Call your doctor or health care professional if you are unable to keep an appointment. °What may interact with this medicine? °Do not take this medicine with any of the following medications: °-other medicines containing denosumab °This medicine may also interact with the following medications: °-medicines that lower your chance of fighting infection °-steroid medicines like prednisone or cortisone °This list may not describe all possible interactions. Give your health care provider a list of all the medicines, herbs, non-prescription drugs, or dietary supplements you use. Also tell them if you smoke, drink alcohol, or use illegal drugs. Some items may interact with your medicine. °What should I watch for while using this medicine? °Visit your doctor or health care professional for regular checks on your progress. Your doctor or health care professional may order blood tests and other tests to see how you are doing. °Call your doctor or health care professional for advice if you get a fever, chills or sore throat, or other symptoms of a cold or flu. Do not treat yourself. This drug may decrease your body's ability to fight infection. Try to avoid being around people who are sick. °You should make sure you get enough calcium and vitamin D while you are taking this medicine, unless your doctor tells you not to. Discuss the foods you eat and the vitamins you take with your health care professional. °See your dentist regularly. Brush and floss your teeth as directed. Before you have any dental work done, tell your dentist you are receiving this medicine. °Do not become pregnant while taking this medicine or for 5 months after stopping   it. Talk with your doctor or health care professional about your birth control options while taking this medicine. Women should inform their doctor if they wish to become pregnant or think they might be pregnant. There is a potential for serious side effects to an unborn child. Talk  to your health care professional or pharmacist for more information. What side effects may I notice from receiving this medicine? Side effects that you should report to your doctor or health care professional as soon as possible: -allergic reactions like skin rash, itching or hives, swelling of the face, lips, or tongue -bone pain -breathing problems -dizziness -jaw pain, especially after dental work -redness, blistering, peeling of the skin -signs and symptoms of infection like fever or chills; cough; sore throat; pain or trouble passing urine -signs of low calcium like fast heartbeat, muscle cramps or muscle pain; pain, tingling, numbness in the hands or feet; seizures -unusual bleeding or bruising -unusually weak or tired Side effects that usually do not require medical attention (report to your doctor or health care professional if they continue or are bothersome): -constipation -diarrhea -headache -joint pain -loss of appetite -muscle pain -runny nose -tiredness -upset stomach This list may not describe all possible side effects. Call your doctor for medical advice about side effects. You may report side effects to FDA at 1-800-FDA-1088. Where should I keep my medicine? This medicine is only given in a clinic, doctor's office, or other health care setting and will not be stored at home. NOTE: This sheet is a summary. It may not cover all possible information. If you have questions about this medicine, talk to your doctor, pharmacist, or health care provider.  2018 [employer]/Gold Standard (2016-03-02 19:17:21)

## 2017-11-09 ENCOUNTER — Encounter (HOSPITAL_COMMUNITY)
Admission: RE | Admit: 2017-11-09 | Discharge: 2017-11-09 | Disposition: A | Discharge status: Home / Self Care | Payer: Self-pay | Source: Ambulatory Visit | Attending: Emergency Medicine | Admitting: Emergency Medicine

## 2017-11-11 ENCOUNTER — Encounter (HOSPITAL_COMMUNITY)
Admission: RE | Admit: 2017-11-11 | Discharge: 2017-11-11 | Disposition: A | Discharge status: Home / Self Care | Payer: Medicare Other | Source: Ambulatory Visit | Attending: Emergency Medicine | Admitting: Emergency Medicine

## 2017-11-14 ENCOUNTER — Encounter (HOSPITAL_COMMUNITY)
Admission: RE | Admit: 2017-11-14 | Discharge: 2017-11-14 | Disposition: A | Discharge status: Home / Self Care | Payer: Medicare Other | Source: Ambulatory Visit | Attending: Emergency Medicine | Admitting: Emergency Medicine

## 2017-11-16 ENCOUNTER — Encounter (HOSPITAL_COMMUNITY): Payer: Self-pay

## 2017-11-18 ENCOUNTER — Encounter (HOSPITAL_COMMUNITY)
Admission: RE | Admit: 2017-11-18 | Discharge: 2017-11-18 | Disposition: A | Discharge status: Home / Self Care | Payer: Self-pay | Source: Ambulatory Visit | Attending: Emergency Medicine | Admitting: Emergency Medicine

## 2017-11-21 ENCOUNTER — Encounter (HOSPITAL_COMMUNITY)
Admission: RE | Admit: 2017-11-21 | Discharge: 2017-11-21 | Disposition: A | Discharge status: Home / Self Care | Payer: Self-pay | Source: Ambulatory Visit | Attending: Emergency Medicine | Admitting: Emergency Medicine

## 2017-11-23 ENCOUNTER — Encounter (HOSPITAL_COMMUNITY)
Admission: RE | Admit: 2017-11-23 | Discharge: 2017-11-23 | Disposition: A | Discharge status: Home / Self Care | Payer: Self-pay | Source: Ambulatory Visit | Attending: Emergency Medicine | Admitting: Emergency Medicine

## 2017-11-23 DIAGNOSIS — I272 Pulmonary hypertension, unspecified: Secondary | ICD-10-CM | POA: Insufficient documentation

## 2017-11-23 DIAGNOSIS — M349 Systemic sclerosis, unspecified: Secondary | ICD-10-CM | POA: Insufficient documentation

## 2017-11-25 ENCOUNTER — Encounter (HOSPITAL_COMMUNITY)
Admission: RE | Admit: 2017-11-25 | Discharge: 2017-11-25 | Disposition: A | Discharge status: Home / Self Care | Payer: Self-pay | Source: Ambulatory Visit | Attending: Emergency Medicine | Admitting: Emergency Medicine

## 2017-11-28 ENCOUNTER — Encounter (HOSPITAL_COMMUNITY)
Admission: RE | Admit: 2017-11-28 | Discharge: 2017-11-28 | Disposition: A | Discharge status: Home / Self Care | Payer: Self-pay | Source: Ambulatory Visit | Attending: Emergency Medicine | Admitting: Emergency Medicine

## 2017-11-30 ENCOUNTER — Encounter (HOSPITAL_COMMUNITY)
Admission: RE | Admit: 2017-11-30 | Discharge: 2017-11-30 | Disposition: A | Discharge status: Home / Self Care | Payer: Self-pay | Source: Ambulatory Visit | Attending: Emergency Medicine | Admitting: Emergency Medicine

## 2017-12-02 ENCOUNTER — Encounter (HOSPITAL_COMMUNITY)
Admission: RE | Admit: 2017-12-02 | Discharge: 2017-12-02 | Disposition: A | Discharge status: Home / Self Care | Payer: Self-pay | Source: Ambulatory Visit | Attending: Emergency Medicine | Admitting: Emergency Medicine

## 2017-12-05 ENCOUNTER — Encounter (HOSPITAL_COMMUNITY)
Admission: RE | Admit: 2017-12-05 | Discharge: 2017-12-05 | Disposition: A | Discharge status: Home / Self Care | Payer: Medicare Other | Source: Ambulatory Visit | Attending: Emergency Medicine | Admitting: Emergency Medicine

## 2017-12-07 ENCOUNTER — Encounter (HOSPITAL_COMMUNITY)
Admission: RE | Admit: 2017-12-07 | Discharge: 2017-12-07 | Disposition: A | Discharge status: Home / Self Care | Payer: Medicare Other | Source: Ambulatory Visit | Attending: Emergency Medicine | Admitting: Emergency Medicine

## 2017-12-09 ENCOUNTER — Encounter (HOSPITAL_COMMUNITY)
Admission: RE | Admit: 2017-12-09 | Discharge: 2017-12-09 | Disposition: A | Discharge status: Home / Self Care | Payer: Medicare Other | Source: Ambulatory Visit | Attending: Emergency Medicine | Admitting: Emergency Medicine

## 2017-12-12 ENCOUNTER — Encounter (HOSPITAL_COMMUNITY): Payer: Self-pay

## 2017-12-14 ENCOUNTER — Encounter (HOSPITAL_COMMUNITY)
Admission: RE | Admit: 2017-12-14 | Discharge: 2017-12-14 | Disposition: A | Discharge status: Home / Self Care | Payer: Self-pay | Source: Ambulatory Visit | Attending: Emergency Medicine | Admitting: Emergency Medicine

## 2017-12-16 ENCOUNTER — Encounter (HOSPITAL_COMMUNITY)
Admission: RE | Admit: 2017-12-16 | Discharge: 2017-12-16 | Disposition: A | Discharge status: Home / Self Care | Payer: Self-pay | Source: Ambulatory Visit | Attending: Emergency Medicine | Admitting: Emergency Medicine

## 2017-12-19 ENCOUNTER — Encounter (HOSPITAL_COMMUNITY)
Admission: RE | Admit: 2017-12-19 | Discharge: 2017-12-19 | Disposition: A | Discharge status: Home / Self Care | Payer: Self-pay | Source: Ambulatory Visit | Attending: Emergency Medicine | Admitting: Emergency Medicine

## 2017-12-21 ENCOUNTER — Encounter (HOSPITAL_COMMUNITY)
Admission: RE | Admit: 2017-12-21 | Discharge: 2017-12-21 | Disposition: A | Discharge status: Home / Self Care | Payer: Self-pay | Source: Ambulatory Visit | Attending: Emergency Medicine | Admitting: Emergency Medicine

## 2017-12-23 ENCOUNTER — Encounter (HOSPITAL_COMMUNITY): Payer: Self-pay

## 2017-12-23 DIAGNOSIS — I272 Pulmonary hypertension, unspecified: Secondary | ICD-10-CM | POA: Insufficient documentation

## 2017-12-23 DIAGNOSIS — M349 Systemic sclerosis, unspecified: Secondary | ICD-10-CM | POA: Insufficient documentation

## 2017-12-26 ENCOUNTER — Encounter (HOSPITAL_COMMUNITY)
Admission: RE | Admit: 2017-12-26 | Discharge: 2017-12-26 | Disposition: A | Discharge status: Home / Self Care | Payer: Self-pay | Source: Ambulatory Visit | Attending: Emergency Medicine | Admitting: Emergency Medicine

## 2017-12-28 ENCOUNTER — Encounter (HOSPITAL_COMMUNITY): Payer: Self-pay

## 2017-12-30 ENCOUNTER — Encounter (HOSPITAL_COMMUNITY)
Admission: RE | Admit: 2017-12-30 | Discharge: 2017-12-30 | Disposition: A | Discharge status: Home / Self Care | Payer: Medicare Other | Source: Ambulatory Visit | Attending: Emergency Medicine | Admitting: Emergency Medicine

## 2018-01-02 ENCOUNTER — Encounter (HOSPITAL_COMMUNITY)
Admission: RE | Admit: 2018-01-02 | Discharge: 2018-01-02 | Disposition: A | Discharge status: Home / Self Care | Payer: Self-pay | Source: Ambulatory Visit | Attending: Emergency Medicine | Admitting: Emergency Medicine

## 2018-01-04 ENCOUNTER — Encounter (HOSPITAL_COMMUNITY)
Admission: RE | Admit: 2018-01-04 | Discharge: 2018-01-04 | Disposition: A | Discharge status: Home / Self Care | Payer: Self-pay | Source: Ambulatory Visit | Attending: Emergency Medicine | Admitting: Emergency Medicine

## 2018-01-06 ENCOUNTER — Encounter (HOSPITAL_COMMUNITY)
Admission: RE | Admit: 2018-01-06 | Discharge: 2018-01-06 | Disposition: A | Discharge status: Home / Self Care | Payer: Self-pay | Source: Ambulatory Visit | Attending: Emergency Medicine | Admitting: Emergency Medicine

## 2018-01-09 ENCOUNTER — Encounter (HOSPITAL_COMMUNITY)
Admission: RE | Admit: 2018-01-09 | Discharge: 2018-01-09 | Disposition: A | Discharge status: Home / Self Care | Payer: Medicare Other | Source: Ambulatory Visit | Attending: Emergency Medicine | Admitting: Emergency Medicine

## 2018-01-11 ENCOUNTER — Encounter (HOSPITAL_COMMUNITY)
Admission: RE | Admit: 2018-01-11 | Discharge: 2018-01-11 | Disposition: A | Discharge status: Home / Self Care | Payer: Self-pay | Source: Ambulatory Visit | Attending: Emergency Medicine | Admitting: Emergency Medicine

## 2018-01-13 ENCOUNTER — Encounter (HOSPITAL_COMMUNITY)
Admission: RE | Admit: 2018-01-13 | Discharge: 2018-01-13 | Disposition: A | Discharge status: Home / Self Care | Payer: Self-pay | Source: Ambulatory Visit | Attending: Emergency Medicine | Admitting: Emergency Medicine

## 2018-01-16 ENCOUNTER — Encounter (HOSPITAL_COMMUNITY)
Admission: RE | Admit: 2018-01-16 | Discharge: 2018-01-16 | Disposition: A | Discharge status: Home / Self Care | Payer: Self-pay | Source: Ambulatory Visit | Attending: Emergency Medicine | Admitting: Emergency Medicine

## 2018-01-18 ENCOUNTER — Encounter (HOSPITAL_COMMUNITY)
Admission: RE | Admit: 2018-01-18 | Discharge: 2018-01-18 | Disposition: A | Discharge status: Home / Self Care | Payer: Medicare Other | Source: Ambulatory Visit | Attending: Emergency Medicine | Admitting: Emergency Medicine

## 2018-01-20 ENCOUNTER — Encounter (HOSPITAL_COMMUNITY): Payer: Self-pay

## 2018-01-23 ENCOUNTER — Encounter (HOSPITAL_COMMUNITY)
Admission: RE | Admit: 2018-01-23 | Discharge: 2018-01-23 | Disposition: A | Discharge status: Home / Self Care | Payer: Self-pay | Source: Ambulatory Visit | Attending: Emergency Medicine | Admitting: Emergency Medicine

## 2018-01-23 DIAGNOSIS — I272 Pulmonary hypertension, unspecified: Secondary | ICD-10-CM | POA: Insufficient documentation

## 2018-01-23 DIAGNOSIS — M349 Systemic sclerosis, unspecified: Secondary | ICD-10-CM | POA: Insufficient documentation

## 2018-01-25 ENCOUNTER — Encounter (HOSPITAL_COMMUNITY)
Admission: RE | Admit: 2018-01-25 | Discharge: 2018-01-25 | Disposition: A | Discharge status: Home / Self Care | Payer: Self-pay | Source: Ambulatory Visit | Attending: Emergency Medicine | Admitting: Emergency Medicine

## 2018-01-27 ENCOUNTER — Encounter (HOSPITAL_COMMUNITY)
Admission: RE | Admit: 2018-01-27 | Discharge: 2018-01-27 | Disposition: A | Discharge status: Home / Self Care | Payer: Self-pay | Source: Ambulatory Visit | Attending: Emergency Medicine | Admitting: Emergency Medicine

## 2018-01-30 ENCOUNTER — Encounter (HOSPITAL_COMMUNITY)
Admission: RE | Admit: 2018-01-30 | Discharge: 2018-01-30 | Disposition: A | Discharge status: Home / Self Care | Payer: Self-pay | Source: Ambulatory Visit | Attending: Emergency Medicine | Admitting: Emergency Medicine

## 2018-02-01 ENCOUNTER — Encounter (HOSPITAL_COMMUNITY)
Admission: RE | Admit: 2018-02-01 | Discharge: 2018-02-01 | Disposition: A | Discharge status: Home / Self Care | Payer: Self-pay | Source: Ambulatory Visit | Attending: Emergency Medicine | Admitting: Emergency Medicine

## 2018-02-03 ENCOUNTER — Encounter (HOSPITAL_COMMUNITY)
Admission: RE | Admit: 2018-02-03 | Discharge: 2018-02-03 | Disposition: A | Discharge status: Home / Self Care | Payer: Self-pay | Source: Ambulatory Visit | Attending: Emergency Medicine | Admitting: Emergency Medicine

## 2018-02-06 ENCOUNTER — Encounter (HOSPITAL_COMMUNITY)
Admission: RE | Admit: 2018-02-06 | Discharge: 2018-02-06 | Disposition: A | Discharge status: Home / Self Care | Payer: Self-pay | Source: Ambulatory Visit | Attending: Emergency Medicine | Admitting: Emergency Medicine

## 2018-02-08 ENCOUNTER — Encounter (HOSPITAL_COMMUNITY)
Admission: RE | Admit: 2018-02-08 | Discharge: 2018-02-08 | Disposition: A | Discharge status: Home / Self Care | Payer: Self-pay | Source: Ambulatory Visit | Attending: Emergency Medicine | Admitting: Emergency Medicine

## 2018-02-10 ENCOUNTER — Encounter (HOSPITAL_COMMUNITY)
Admission: RE | Admit: 2018-02-10 | Discharge: 2018-02-10 | Disposition: A | Discharge status: Home / Self Care | Payer: Self-pay | Source: Ambulatory Visit | Attending: Emergency Medicine | Admitting: Emergency Medicine

## 2018-02-13 ENCOUNTER — Encounter (HOSPITAL_COMMUNITY)
Admission: RE | Admit: 2018-02-13 | Discharge: 2018-02-13 | Disposition: A | Discharge status: Home / Self Care | Payer: Self-pay | Source: Ambulatory Visit | Attending: Emergency Medicine | Admitting: Emergency Medicine

## 2018-02-17 ENCOUNTER — Encounter (HOSPITAL_COMMUNITY)
Admission: RE | Admit: 2018-02-17 | Discharge: 2018-02-17 | Disposition: A | Discharge status: Home / Self Care | Payer: Self-pay | Source: Ambulatory Visit | Attending: Internal Medicine | Admitting: Internal Medicine

## 2018-02-20 ENCOUNTER — Encounter (HOSPITAL_COMMUNITY)
Admission: RE | Admit: 2018-02-20 | Discharge: 2018-02-20 | Disposition: A | Discharge status: Home / Self Care | Payer: Self-pay | Source: Ambulatory Visit | Attending: Internal Medicine | Admitting: Internal Medicine

## 2018-02-24 ENCOUNTER — Encounter (HOSPITAL_COMMUNITY)
Admission: RE | Admit: 2018-02-24 | Discharge: 2018-02-24 | Disposition: A | Discharge status: Home / Self Care | Payer: Self-pay | Source: Ambulatory Visit | Attending: Internal Medicine | Admitting: Internal Medicine

## 2018-02-24 DIAGNOSIS — I272 Pulmonary hypertension, unspecified: Secondary | ICD-10-CM | POA: Insufficient documentation

## 2018-02-24 DIAGNOSIS — M349 Systemic sclerosis, unspecified: Secondary | ICD-10-CM | POA: Insufficient documentation

## 2018-02-27 ENCOUNTER — Encounter (HOSPITAL_COMMUNITY)
Admission: RE | Admit: 2018-02-27 | Discharge: 2018-02-27 | Disposition: A | Discharge status: Home / Self Care | Payer: Self-pay | Source: Ambulatory Visit | Attending: Internal Medicine | Admitting: Internal Medicine

## 2018-03-01 ENCOUNTER — Encounter (HOSPITAL_COMMUNITY)
Admission: RE | Admit: 2018-03-01 | Discharge: 2018-03-01 | Disposition: A | Discharge status: Home / Self Care | Payer: Self-pay | Source: Ambulatory Visit | Attending: Internal Medicine | Admitting: Internal Medicine

## 2018-03-03 ENCOUNTER — Encounter (HOSPITAL_COMMUNITY)
Admission: RE | Admit: 2018-03-03 | Discharge: 2018-03-03 | Disposition: A | Discharge status: Home / Self Care | Payer: Self-pay | Source: Ambulatory Visit | Attending: Internal Medicine | Admitting: Internal Medicine

## 2018-03-08 ENCOUNTER — Encounter (HOSPITAL_COMMUNITY)
Admission: RE | Admit: 2018-03-08 | Discharge: 2018-03-08 | Disposition: A | Discharge status: Home / Self Care | Payer: Self-pay | Source: Ambulatory Visit | Attending: Internal Medicine | Admitting: Internal Medicine

## 2018-03-10 ENCOUNTER — Encounter (HOSPITAL_COMMUNITY)
Admission: RE | Admit: 2018-03-10 | Discharge: 2018-03-10 | Disposition: A | Discharge status: Home / Self Care | Payer: Self-pay | Source: Ambulatory Visit | Attending: Internal Medicine | Admitting: Internal Medicine

## 2018-03-13 ENCOUNTER — Encounter (HOSPITAL_COMMUNITY)
Admission: RE | Admit: 2018-03-13 | Discharge: 2018-03-13 | Disposition: A | Discharge status: Home / Self Care | Payer: Self-pay | Source: Ambulatory Visit | Attending: Internal Medicine | Admitting: Internal Medicine

## 2018-03-15 ENCOUNTER — Encounter (HOSPITAL_COMMUNITY)
Admission: RE | Admit: 2018-03-15 | Discharge: 2018-03-15 | Disposition: A | Discharge status: Home / Self Care | Payer: Self-pay | Source: Ambulatory Visit | Attending: Internal Medicine | Admitting: Internal Medicine

## 2018-03-17 ENCOUNTER — Encounter (HOSPITAL_COMMUNITY)
Admission: RE | Admit: 2018-03-17 | Discharge: 2018-03-17 | Disposition: A | Discharge status: Home / Self Care | Payer: Self-pay | Source: Ambulatory Visit | Attending: Internal Medicine | Admitting: Internal Medicine

## 2018-03-20 ENCOUNTER — Encounter (HOSPITAL_COMMUNITY)
Admission: RE | Admit: 2018-03-20 | Discharge: 2018-03-20 | Disposition: A | Discharge status: Home / Self Care | Payer: Self-pay | Source: Ambulatory Visit | Attending: Internal Medicine | Admitting: Internal Medicine

## 2018-03-22 ENCOUNTER — Telehealth (HOSPITAL_COMMUNITY): Payer: Self-pay | Admitting: Internal Medicine

## 2018-03-24 ENCOUNTER — Encounter (HOSPITAL_COMMUNITY)
Admission: RE | Admit: 2018-03-24 | Discharge: 2018-03-24 | Disposition: A | Discharge status: Home / Self Care | Payer: Self-pay | Source: Ambulatory Visit | Attending: Internal Medicine | Admitting: Internal Medicine

## 2018-03-27 ENCOUNTER — Encounter (HOSPITAL_COMMUNITY)
Admission: RE | Admit: 2018-03-27 | Discharge: 2018-03-27 | Disposition: A | Discharge status: Home / Self Care | Payer: Self-pay | Source: Ambulatory Visit | Attending: Internal Medicine | Admitting: Internal Medicine

## 2018-03-27 DIAGNOSIS — I272 Pulmonary hypertension, unspecified: Secondary | ICD-10-CM | POA: Insufficient documentation

## 2018-03-27 DIAGNOSIS — M349 Systemic sclerosis, unspecified: Secondary | ICD-10-CM | POA: Insufficient documentation

## 2018-03-31 ENCOUNTER — Encounter (HOSPITAL_COMMUNITY)
Admission: RE | Admit: 2018-03-31 | Discharge: 2018-03-31 | Disposition: A | Discharge status: Home / Self Care | Payer: Self-pay | Source: Ambulatory Visit | Attending: Internal Medicine | Admitting: Internal Medicine

## 2018-04-03 ENCOUNTER — Encounter (HOSPITAL_COMMUNITY)
Admission: RE | Admit: 2018-04-03 | Discharge: 2018-04-03 | Disposition: A | Discharge status: Home / Self Care | Payer: Self-pay | Source: Ambulatory Visit | Attending: Internal Medicine | Admitting: Internal Medicine

## 2018-04-05 ENCOUNTER — Encounter (HOSPITAL_COMMUNITY)
Admission: RE | Admit: 2018-04-05 | Discharge: 2018-04-05 | Disposition: A | Discharge status: Home / Self Care | Payer: Self-pay | Source: Ambulatory Visit | Attending: Internal Medicine | Admitting: Internal Medicine

## 2018-04-07 ENCOUNTER — Encounter (HOSPITAL_COMMUNITY)
Admission: RE | Admit: 2018-04-07 | Discharge: 2018-04-07 | Disposition: A | Discharge status: Home / Self Care | Payer: Self-pay | Source: Ambulatory Visit | Attending: Internal Medicine | Admitting: Internal Medicine

## 2018-04-10 ENCOUNTER — Encounter (HOSPITAL_COMMUNITY)
Admission: RE | Admit: 2018-04-10 | Discharge: 2018-04-10 | Disposition: A | Discharge status: Home / Self Care | Payer: Self-pay | Source: Ambulatory Visit | Attending: Internal Medicine | Admitting: Internal Medicine

## 2018-04-12 ENCOUNTER — Encounter (HOSPITAL_COMMUNITY)
Admission: RE | Admit: 2018-04-12 | Discharge: 2018-04-12 | Disposition: A | Discharge status: Home / Self Care | Payer: Self-pay | Source: Ambulatory Visit | Attending: Internal Medicine | Admitting: Internal Medicine

## 2018-04-17 ENCOUNTER — Encounter (HOSPITAL_COMMUNITY)
Admission: RE | Admit: 2018-04-17 | Discharge: 2018-04-17 | Disposition: A | Discharge status: Home / Self Care | Payer: Self-pay | Source: Ambulatory Visit | Attending: Internal Medicine | Admitting: Internal Medicine

## 2018-04-19 ENCOUNTER — Encounter (HOSPITAL_COMMUNITY)
Admission: RE | Admit: 2018-04-19 | Discharge: 2018-04-19 | Disposition: A | Discharge status: Home / Self Care | Payer: Self-pay | Source: Ambulatory Visit | Attending: Internal Medicine | Admitting: Internal Medicine

## 2018-04-21 ENCOUNTER — Encounter (HOSPITAL_COMMUNITY)
Admission: RE | Admit: 2018-04-21 | Discharge: 2018-04-21 | Disposition: A | Discharge status: Home / Self Care | Payer: Self-pay | Source: Ambulatory Visit | Attending: Internal Medicine | Admitting: Internal Medicine

## 2018-04-24 ENCOUNTER — Encounter (HOSPITAL_COMMUNITY)
Admission: RE | Admit: 2018-04-24 | Discharge: 2018-04-24 | Disposition: A | Discharge status: Home / Self Care | Payer: Self-pay | Source: Ambulatory Visit | Attending: Internal Medicine | Admitting: Internal Medicine

## 2018-04-24 DIAGNOSIS — M349 Systemic sclerosis, unspecified: Secondary | ICD-10-CM | POA: Insufficient documentation

## 2018-04-24 DIAGNOSIS — I272 Pulmonary hypertension, unspecified: Secondary | ICD-10-CM | POA: Insufficient documentation

## 2018-04-26 ENCOUNTER — Encounter (HOSPITAL_COMMUNITY)
Admission: RE | Admit: 2018-04-26 | Discharge: 2018-04-26 | Disposition: A | Discharge status: Home / Self Care | Payer: Self-pay | Source: Ambulatory Visit | Attending: Internal Medicine | Admitting: Internal Medicine

## 2018-04-28 ENCOUNTER — Encounter (HOSPITAL_COMMUNITY)
Admission: RE | Admit: 2018-04-28 | Discharge: 2018-04-28 | Disposition: A | Discharge status: Home / Self Care | Payer: Self-pay | Source: Ambulatory Visit | Attending: Pulmonary Disease | Admitting: Pulmonary Disease

## 2018-05-01 ENCOUNTER — Encounter (HOSPITAL_COMMUNITY)
Admission: RE | Admit: 2018-05-01 | Discharge: 2018-05-01 | Disposition: A | Discharge status: Home / Self Care | Payer: Self-pay | Source: Ambulatory Visit | Attending: Internal Medicine | Admitting: Internal Medicine

## 2018-05-03 ENCOUNTER — Encounter (HOSPITAL_COMMUNITY)
Admission: RE | Admit: 2018-05-03 | Discharge: 2018-05-03 | Disposition: A | Discharge status: Home / Self Care | Payer: Self-pay | Source: Ambulatory Visit | Attending: Internal Medicine | Admitting: Internal Medicine

## 2018-05-03 ENCOUNTER — Other Ambulatory Visit: Payer: Self-pay

## 2018-05-05 ENCOUNTER — Encounter (HOSPITAL_COMMUNITY)
Admission: RE | Admit: 2018-05-05 | Discharge: 2018-05-05 | Disposition: A | Discharge status: Home / Self Care | Payer: Self-pay | Source: Ambulatory Visit | Attending: Internal Medicine | Admitting: Internal Medicine

## 2018-05-05 ENCOUNTER — Other Ambulatory Visit: Payer: Self-pay

## 2018-05-10 ENCOUNTER — Ambulatory Visit (HOSPITAL_COMMUNITY): Payer: Medicare Other

## 2018-05-16 ENCOUNTER — Telehealth (HOSPITAL_COMMUNITY): Payer: Self-pay | Admitting: *Deleted

## 2018-05-17 ENCOUNTER — Ambulatory Visit (HOSPITAL_COMMUNITY): Payer: Medicare Other

## 2018-06-06 ENCOUNTER — Telehealth (HOSPITAL_COMMUNITY): Payer: Self-pay

## 2018-07-04 ENCOUNTER — Emergency Department (HOSPITAL_COMMUNITY): Payer: Medicare Other

## 2018-07-04 ENCOUNTER — Encounter (HOSPITAL_COMMUNITY): Payer: Self-pay | Admitting: Emergency Medicine

## 2018-07-04 ENCOUNTER — Inpatient Hospital Stay (HOSPITAL_COMMUNITY)
Admission: EM | Admit: 2018-07-04 | Discharge: 2018-07-06 | DRG: 189 | Disposition: A | Discharge status: Home / Self Care | Payer: Medicare Other | Attending: Internal Medicine | Admitting: Internal Medicine

## 2018-07-04 ENCOUNTER — Other Ambulatory Visit: Payer: Self-pay

## 2018-07-04 DIAGNOSIS — H9191 Unspecified hearing loss, right ear: Secondary | ICD-10-CM | POA: Diagnosis present

## 2018-07-04 DIAGNOSIS — G894 Chronic pain syndrome: Secondary | ICD-10-CM

## 2018-07-04 DIAGNOSIS — G8929 Other chronic pain: Secondary | ICD-10-CM | POA: Diagnosis present

## 2018-07-04 DIAGNOSIS — J479 Bronchiectasis, uncomplicated: Secondary | ICD-10-CM | POA: Diagnosis not present

## 2018-07-04 DIAGNOSIS — I110 Hypertensive heart disease with heart failure: Secondary | ICD-10-CM | POA: Diagnosis present

## 2018-07-04 DIAGNOSIS — Z86010 Personal history of colonic polyps: Secondary | ICD-10-CM

## 2018-07-04 DIAGNOSIS — D3A090 Benign carcinoid tumor of the bronchus and lung: Secondary | ICD-10-CM | POA: Diagnosis not present

## 2018-07-04 DIAGNOSIS — J849 Interstitial pulmonary disease, unspecified: Secondary | ICD-10-CM | POA: Diagnosis present

## 2018-07-04 DIAGNOSIS — I2721 Secondary pulmonary arterial hypertension: Secondary | ICD-10-CM | POA: Diagnosis present

## 2018-07-04 DIAGNOSIS — Z515 Encounter for palliative care: Secondary | ICD-10-CM | POA: Diagnosis not present

## 2018-07-04 DIAGNOSIS — J449 Chronic obstructive pulmonary disease, unspecified: Secondary | ICD-10-CM | POA: Diagnosis present

## 2018-07-04 DIAGNOSIS — G4700 Insomnia, unspecified: Secondary | ICD-10-CM | POA: Diagnosis present

## 2018-07-04 DIAGNOSIS — I469 Cardiac arrest, cause unspecified: Secondary | ICD-10-CM | POA: Diagnosis present

## 2018-07-04 DIAGNOSIS — Z20828 Contact with and (suspected) exposure to other viral communicable diseases: Secondary | ICD-10-CM | POA: Diagnosis present

## 2018-07-04 DIAGNOSIS — J9621 Acute and chronic respiratory failure with hypoxia: Secondary | ICD-10-CM | POA: Diagnosis not present

## 2018-07-04 DIAGNOSIS — Z79891 Long term (current) use of opiate analgesic: Secondary | ICD-10-CM

## 2018-07-04 DIAGNOSIS — J9622 Acute and chronic respiratory failure with hypercapnia: Secondary | ICD-10-CM | POA: Diagnosis present

## 2018-07-04 DIAGNOSIS — R092 Respiratory arrest: Secondary | ICD-10-CM

## 2018-07-04 DIAGNOSIS — I2510 Atherosclerotic heart disease of native coronary artery without angina pectoris: Secondary | ICD-10-CM | POA: Diagnosis present

## 2018-07-04 DIAGNOSIS — Z9981 Dependence on supplemental oxygen: Secondary | ICD-10-CM

## 2018-07-04 DIAGNOSIS — Z66 Do not resuscitate: Secondary | ICD-10-CM | POA: Diagnosis present

## 2018-07-04 DIAGNOSIS — I50810 Right heart failure, unspecified: Secondary | ICD-10-CM | POA: Diagnosis present

## 2018-07-04 DIAGNOSIS — J984 Other disorders of lung: Secondary | ICD-10-CM | POA: Diagnosis present

## 2018-07-04 DIAGNOSIS — E785 Hyperlipidemia, unspecified: Secondary | ICD-10-CM | POA: Diagnosis present

## 2018-07-04 DIAGNOSIS — F411 Generalized anxiety disorder: Secondary | ICD-10-CM

## 2018-07-04 DIAGNOSIS — Z85118 Personal history of other malignant neoplasm of bronchus and lung: Secondary | ICD-10-CM

## 2018-07-04 DIAGNOSIS — Z888 Allergy status to other drugs, medicaments and biological substances status: Secondary | ICD-10-CM

## 2018-07-04 DIAGNOSIS — R911 Solitary pulmonary nodule: Secondary | ICD-10-CM | POA: Diagnosis present

## 2018-07-04 DIAGNOSIS — Z7952 Long term (current) use of systemic steroids: Secondary | ICD-10-CM

## 2018-07-04 DIAGNOSIS — M8580 Other specified disorders of bone density and structure, unspecified site: Secondary | ICD-10-CM | POA: Diagnosis present

## 2018-07-04 DIAGNOSIS — G629 Polyneuropathy, unspecified: Secondary | ICD-10-CM | POA: Diagnosis present

## 2018-07-04 DIAGNOSIS — M341 CR(E)ST syndrome: Secondary | ICD-10-CM | POA: Diagnosis present

## 2018-07-04 LAB — COMPREHENSIVE METABOLIC PANEL
ALT: 34 U/L (ref 0–44)
AST: 41 U/L (ref 15–41)
Albumin: 3.3 g/dL — ABNORMAL LOW (ref 3.5–5.0)
Alkaline Phosphatase: 62 U/L (ref 38–126)
Anion gap: 12 (ref 5–15)
BUN: 20 mg/dL (ref 8–23)
CO2: 32 mmol/L (ref 22–32)
Calcium: 8.8 mg/dL — ABNORMAL LOW (ref 8.9–10.3)
Chloride: 100 mmol/L (ref 98–111)
Creatinine, Ser: 0.85 mg/dL (ref 0.44–1.00)
GFR calc Af Amer: >60 mL/min (ref >60)
GFR calc non Af Amer: >60 mL/min (ref >60)
Glucose, Bld: 279 mg/dL — ABNORMAL HIGH (ref 70–99)
Potassium: 3.8 mmol/L (ref 3.5–5.1)
Sodium: 144 mmol/L (ref 135–145)
Total Bilirubin: 0.5 mg/dL (ref 0.3–1.2)
Total Protein: 6.6 g/dL (ref 6.5–8.1)

## 2018-07-04 LAB — POCT I-STAT 7, (LYTES, BLD GAS, ICA,H+H)
Acid-Base Excess: 16 mmol/L — ABNORMAL HIGH (ref 0.0–2.0)
Bicarbonate: 44.5 mmol/L — ABNORMAL HIGH (ref 20.0–28.0)
Calcium, Ion: 1.21 mmol/L (ref 1.15–1.40)
HCT: 31 % — ABNORMAL LOW (ref 36.0–46.0)
Hemoglobin: 10.5 g/dL — ABNORMAL LOW (ref 12.0–15.0)
O2 Saturation: 93 %
Patient temperature: 97.6
Potassium: 3.2 mmol/L — ABNORMAL LOW (ref 3.5–5.1)
Sodium: 142 mmol/L (ref 135–145)
TCO2: 47 mmol/L — ABNORMAL HIGH (ref 22–32)
pCO2 arterial: 77.2 mmHg (ref 32.0–48.0)
pH, Arterial: 7.366 (ref 7.350–7.450)
pO2, Arterial: 72 mmHg — ABNORMAL LOW (ref 83.0–108.0)

## 2018-07-04 LAB — CBC WITH DIFFERENTIAL/PLATELET
Abs Immature Granulocytes: 0.55 10*3/uL — ABNORMAL HIGH (ref 0.00–0.07)
Basophils Absolute: 0.1 10*3/uL (ref 0.0–0.1)
Basophils Relative: 0 %
Eosinophils Absolute: 0.3 10*3/uL (ref 0.0–0.5)
Eosinophils Relative: 1 %
HCT: 34.1 % — ABNORMAL LOW (ref 36.0–46.0)
Hemoglobin: 9.7 g/dL — ABNORMAL LOW (ref 12.0–15.0)
Immature Granulocytes: 3 %
Lymphocytes Relative: 14 %
Lymphs Abs: 2.6 10*3/uL (ref 0.7–4.0)
MCH: 27.8 pg (ref 26.0–34.0)
MCHC: 28.4 g/dL — ABNORMAL LOW (ref 30.0–36.0)
MCV: 97.7 fL (ref 80.0–100.0)
Monocytes Absolute: 1 10*3/uL (ref 0.1–1.0)
Monocytes Relative: 6 %
Neutro Abs: 13.6 10*3/uL — ABNORMAL HIGH (ref 1.7–7.7)
Neutrophils Relative %: 76 %
Platelets: 341 10*3/uL (ref 150–400)
RBC: 3.49 MIL/uL — ABNORMAL LOW (ref 3.87–5.11)
RDW: 16.2 % — ABNORMAL HIGH (ref 11.5–15.5)
WBC: 18 10*3/uL — ABNORMAL HIGH (ref 4.0–10.5)
nRBC: 0.1 % (ref 0.0–0.2)

## 2018-07-04 LAB — BRAIN NATRIURETIC PEPTIDE: B Natriuretic Peptide: 424.3 pg/mL — ABNORMAL HIGH (ref 0.0–100.0)

## 2018-07-04 LAB — CBG MONITORING, ED: Glucose-Capillary: 143 mg/dL — ABNORMAL HIGH (ref 70–99)

## 2018-07-04 LAB — SARS CORONAVIRUS 2 BY RT PCR (HOSPITAL ORDER, PERFORMED IN ~~LOC~~ HOSPITAL LAB): SARS Coronavirus 2: NEGATIVE

## 2018-07-04 LAB — TROPONIN I
Troponin I: 0.12 ng/mL (ref <0.03)
Troponin I: 0.68 ng/mL (ref <0.03)

## 2018-07-04 MED ORDER — LIDOCAINE 5 % EX PTCH
1.0000 | MEDICATED_PATCH | CUTANEOUS | Status: DC
  Administered 2018-07-05: 1 via TRANSDERMAL
  Filled 2018-07-04: qty 1

## 2018-07-04 MED ORDER — MORPHINE SULFATE 10 MG/5ML PO SOLN: 2.5000 mg | ORAL | Status: DC | PRN

## 2018-07-04 MED ORDER — GUAIFENESIN ER 600 MG PO TB12
600.0000 mg | ORAL_TABLET | Freq: Two times a day (BID) | ORAL | Status: DC
  Administered 2018-07-04 – 2018-07-06 (×3): 600 mg via ORAL
  Filled 2018-07-04 (×4): qty 1

## 2018-07-04 MED ORDER — ENOXAPARIN SODIUM 40 MG/0.4ML ~~LOC~~ SOLN
40.0000 mg | SUBCUTANEOUS | Status: DC
  Administered 2018-07-04 – 2018-07-05 (×2): 40 mg via SUBCUTANEOUS
  Filled 2018-07-04 (×2): qty 0.4

## 2018-07-04 MED ORDER — MYCOPHENOLATE MOFETIL 500 MG PO TABS: 500.0000 mg | ORAL_TABLET | Freq: Two times a day (BID) | ORAL | Status: DC

## 2018-07-04 MED ORDER — GABAPENTIN 400 MG PO CAPS
400.0000 mg | ORAL_CAPSULE | Freq: Four times a day (QID) | ORAL | Status: DC
  Administered 2018-07-04 – 2018-07-06 (×7): 400 mg via ORAL
  Filled 2018-07-04 (×7): qty 1

## 2018-07-04 MED ORDER — ONDANSETRON HCL 4 MG/2ML IJ SOLN: 4.0000 mg | Freq: Four times a day (QID) | INTRAMUSCULAR | Status: DC | PRN

## 2018-07-04 MED ORDER — DULOXETINE HCL 60 MG PO CPEP
60.0000 mg | ORAL_CAPSULE | Freq: Every evening | ORAL | Status: DC
  Administered 2018-07-05: 60 mg via ORAL
  Filled 2018-07-04: qty 1

## 2018-07-04 MED ORDER — SODIUM CHLORIDE 3 % IN NEBU
3.0000 mL | INHALATION_SOLUTION | Freq: Every day | RESPIRATORY_TRACT | Status: DC
  Administered 2018-07-05: 3 mL via RESPIRATORY_TRACT
  Filled 2018-07-04 (×2): qty 4

## 2018-07-04 MED ORDER — MORPHINE SULFATE 10 MG/5ML PO SOLN
2.5000 mg | ORAL | Status: DC | PRN
  Administered 2018-07-04 – 2018-07-05 (×2): 2.5 mg via ORAL
  Filled 2018-07-04 (×2): qty 2

## 2018-07-04 MED ORDER — FUROSEMIDE 20 MG PO TABS
40.0000 mg | ORAL_TABLET | ORAL | Status: DC
  Administered 2018-07-06: 40 mg via ORAL
  Filled 2018-07-04: qty 2

## 2018-07-04 MED ORDER — TREPROSTINIL 0.6 MG/ML IN SOLN
18.0000 ug | Freq: Four times a day (QID) | RESPIRATORY_TRACT | Status: DC
  Administered 2018-07-05 – 2018-07-06 (×4): 18 ug via RESPIRATORY_TRACT
  Filled 2018-07-04 (×2): qty 2.9

## 2018-07-04 MED ORDER — HYPROMELLOSE (GONIOSCOPIC) 2.5 % OP SOLN
1.0000 [drp] | OPHTHALMIC | Status: DC | PRN
  Filled 2018-07-04: qty 15

## 2018-07-04 MED ORDER — ACETAMINOPHEN 325 MG PO TABS
650.0000 mg | ORAL_TABLET | Freq: Four times a day (QID) | ORAL | Status: DC | PRN
  Administered 2018-07-05: 650 mg via ORAL
  Filled 2018-07-04: qty 2

## 2018-07-04 MED ORDER — ATORVASTATIN CALCIUM 10 MG PO TABS
10.0000 mg | ORAL_TABLET | Freq: Every day | ORAL | Status: DC
  Administered 2018-07-05 – 2018-07-06 (×2): 10 mg via ORAL
  Filled 2018-07-04 (×2): qty 1

## 2018-07-04 MED ORDER — ACETAMINOPHEN 650 MG RE SUPP: 650.0000 mg | Freq: Four times a day (QID) | RECTAL | Status: DC | PRN

## 2018-07-04 MED ORDER — PREDNISONE 20 MG PO TABS
10.0000 mg | ORAL_TABLET | Freq: Every day | ORAL | Status: DC
  Administered 2018-07-05 – 2018-07-06 (×2): 10 mg via ORAL
  Filled 2018-07-04 (×2): qty 1

## 2018-07-04 MED ORDER — MOMETASONE FURO-FORMOTEROL FUM 100-5 MCG/ACT IN AERO
2.0000 | INHALATION_SPRAY | Freq: Two times a day (BID) | RESPIRATORY_TRACT | Status: DC
  Administered 2018-07-04 – 2018-07-06 (×4): 2 via RESPIRATORY_TRACT
  Filled 2018-07-04: qty 8.8

## 2018-07-04 MED ORDER — METHYLPREDNISOLONE SODIUM SUCC 125 MG IJ SOLR
125.0000 mg | Freq: Once | INTRAMUSCULAR | Status: AC
  Administered 2018-07-04: 125 mg via INTRAVENOUS
  Filled 2018-07-04: qty 2

## 2018-07-04 MED ORDER — ONDANSETRON HCL 4 MG PO TABS: 4.0000 mg | ORAL_TABLET | Freq: Four times a day (QID) | ORAL | Status: DC | PRN

## 2018-07-04 MED ORDER — SALINE SPRAY 0.65 % NA SOLN
1.0000 | NASAL | Status: DC | PRN
  Filled 2018-07-04: qty 44

## 2018-07-04 MED ORDER — PANTOPRAZOLE SODIUM 40 MG PO TBEC
40.0000 mg | DELAYED_RELEASE_TABLET | Freq: Every day | ORAL | Status: DC
  Administered 2018-07-05 – 2018-07-06 (×2): 40 mg via ORAL
  Filled 2018-07-04 (×2): qty 1

## 2018-07-04 MED ORDER — ALBUTEROL SULFATE HFA 108 (90 BASE) MCG/ACT IN AERS: 2.0000 | INHALATION_SPRAY | Freq: Four times a day (QID) | RESPIRATORY_TRACT | Status: DC | PRN

## 2018-07-04 MED ORDER — OXYCODONE HCL 5 MG PO TABS
5.0000 mg | ORAL_TABLET | ORAL | Status: DC | PRN
  Administered 2018-07-04 – 2018-07-06 (×7): 5 mg via ORAL
  Filled 2018-07-04 (×7): qty 1

## 2018-07-04 MED ORDER — RISAQUAD PO CAPS
ORAL_CAPSULE | Freq: Every day | ORAL | Status: DC
  Administered 2018-07-05 – 2018-07-06 (×2): 1 via ORAL
  Filled 2018-07-04 (×2): qty 1

## 2018-07-04 MED ORDER — ALBUTEROL SULFATE (2.5 MG/3ML) 0.083% IN NEBU: 2.5000 mg | INHALATION_SOLUTION | RESPIRATORY_TRACT | Status: DC | PRN

## 2018-07-04 MED ORDER — POLYVINYL ALCOHOL 1.4 % OP SOLN
1.0000 [drp] | OPHTHALMIC | Status: DC | PRN
  Filled 2018-07-04: qty 15

## 2018-07-04 MED ORDER — POTASSIUM CHLORIDE CRYS ER 20 MEQ PO TBCR
20.0000 meq | EXTENDED_RELEASE_TABLET | ORAL | Status: DC
  Administered 2018-07-05: 20 meq via ORAL
  Filled 2018-07-04: qty 1

## 2018-07-04 MED ORDER — POLYETHYL GLYCOL-PROPYL GLYCOL 0.4-0.3 % OP SOLN: 1.0000 [drp] | Freq: Every evening | OPHTHALMIC | Status: DC | PRN

## 2018-07-04 MED ORDER — FUROSEMIDE 10 MG/ML IJ SOLN
40.0000 mg | Freq: Once | INTRAMUSCULAR | Status: AC
  Administered 2018-07-04: 40 mg via INTRAVENOUS
  Filled 2018-07-04: qty 4

## 2018-07-04 MED ORDER — TADALAFIL (PAH) 20 MG PO TABS
20.0000 mg | ORAL_TABLET | Freq: Every day | ORAL | Status: DC
  Administered 2018-07-04 – 2018-07-05 (×2): 20 mg via ORAL
  Filled 2018-07-04 (×5): qty 1

## 2018-07-04 NOTE — ED Notes (Signed)
Palliative Care at bedside.

## 2018-07-04 NOTE — Progress Notes (Signed)
[facility] Progress Note Patient Name: Deborah [name] DOB: 1950-03-26 MRN: [id number]   Date of Service  07/04/2018  HPI/Events of Note  Pt admitted earlier from ED with acute on chronic respiratory failure secondary to advanced restrictive lung disease. Pt is a DNR. ABG: 7.36/ 77/ 72 on non-rebreather mask. [facility] hospitalist asking for recommendations.  eICU Interventions  Recommended he wean O2 slowly as tolerated with goal of returning as close to her baseline requirement as possible overnight.        Kerry Kass Verlene Glantz 07/04/2018, 10:11 PM

## 2018-07-04 NOTE — Plan of Care (Signed)

## 2018-07-04 NOTE — ED Provider Notes (Signed)
= [facility] Provider Note   CSN: [id number] Arrival date & time: 07/04/18  1304    History   Chief Complaint Chief Complaint  Patient presents with  . Cardiac Arrest    HPI Deborah [name] is a 68 y.o. female.   HPI  68 year old female with a history of CAD, CHF, crest syndrome, and pulmonary hypertension presents postarrest.  EMS was called out due to patient having difficulty breathing.  Patient is on 7 L of nasal cannula oxygen at baseline that had been increased to 15 L prior to EMS arrival.  Patient was satting in the 12s when EMS arrived.  During their initial evaluation, patient became pulseless and CPR was initiated prior to the husband notifying EMS that she was DNR.  At that time patient had palpable pulses and purposeful movement.  Bag-valve-mask ventilation was performed in route to the ED.  Past Medical History:  Diagnosis Date  . Anemia   . Bronchiectasis ([place])   . C. difficile colitis   . CAD (coronary artery disease)   . Cataract   . CHF (congestive heart failure) (HCC)    right sided heart failure from pulmonary hypertension  . COPD (chronic obstructive pulmonary disease) ([place])   . CREST (calcinosis, Raynaud's phenomenon, esophageal dysfunction, sclerodactyly, telangiectasia) ([place])   . Gallstones   . Hearing loss    right  . Hyperactive airway disease   . Hyperlipidemia   . Hypertension   . Lung cancer ([place]) carcinoid tumor  2004  . Osteopenia   . Parainfluenza   . Pneumonia   . Raynaud's disease   . Respiratory syncytial virus (RSV) infection   . Restrictive lung disease   . Shingles   . Tubulovillous adenoma of rectum 2007    Patient Active Problem List   Diagnosis Date Noted  . Acute on chronic respiratory failure with hypoxia ([place]) 07/04/2018  . Cardiac arrest ([place])   . Palliative care encounter   . Pulmonary hypertension ([place]) 05/19/2016  . ILD (interstitial lung disease) ([place]) 05/19/2016  . Lung  collapse   . Acute respiratory distress   . 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 S/P partial lobectomy of lung 04/05/2016  . Chronic pain 04/05/2016  . Hyperglycemia 04/05/2016  . History of colonic polyps 11/19/2014  . Chronic respiratory failure ([place]) 08/07/2013  . Bronchiectasis without acute exacerbation ([place]) 12/01/2011  . Anemia 03/10/2011  . Restrictive lung disease 12/02/2010  . REACTIVE AIRWAY DISEASE 12/05/2009  . HYPERTENSION 06/18/2008  . BACK PAIN, CHRONIC 02/07/2008  . HLD (hyperlipidemia) 11/08/2006  . RAYNAUD'S DISEASE 11/08/2006  . OSTEOPENIA 11/08/2006    Past Surgical History:  Procedure Laterality Date  . broncho pleural fistula  2004  . COLONOSCOPY    . pulmonary carcinoid  2004  . thoracotomies  2004  . VIDEO BRONCHOSCOPY  12/07/2011   Procedure: VIDEO BRONCHOSCOPY WITHOUT FLUORO;  Surgeon: Kathee Delton, MD;  Location: [facility];  Service: Cardiopulmonary;  Laterality: Bilateral;     OB History   No obstetric history on file.      Home Medications    Prior to Admission medications   Medication Sig Start Date End Date Taking? Authorizing Provider  albuterol (PROVENTIL HFA;VENTOLIN HFA) 108 (90 BASE) MCG/ACT inhaler Inhale 2 puffs into the lungs every 6 (six) hours as needed. For shortness of breath. 10/26/13   Clance, Armando Reichert, MD  atorvastatin (LIPITOR) 10 MG tablet Take 10 mg by mouth daily.  [provider]  cholecalciferol (VITAMIN D) 1000 units tablet Take 2,000 Units by mouth 2 (two) times a week.    [provider]  denosumab (PROLIA) 60 MG/ML SOLN injection Inject 60 mg into the skin every 6 (six) months. Administer in upper arm, thigh, or abdomen    [provider]  DULoxetine (CYMBALTA) 60 MG capsule Take 60 mg by mouth every evening.     [provider]  furosemide (LASIX) 40 MG tablet Take 40 mg by mouth 3 (three) times a week. M-W-F 12/06/13 05/05/17  [provider]  gabapentin  (NEURONTIN) 400 MG capsule Take 400 mg by mouth 4 (four) times daily.    [provider]  hyoscyamine (LEVSIN SL) 0.125 MG SL tablet DISSOLVE 1 TABLET(0.125 MG) UNDER THE TONGUE EVERY 6 HOURS AS NEEDED 05/24/16   Zehr, Janett Billow D, PA-C  lidocaine (LIDODERM) 5 % Place 1 patch onto the skin daily. Remove & Discard patch within 12 hours or as directed by MD    [provider]  mometasone-formoterol (DULERA) 100-5 MCG/ACT AERO Inhale 2 puffs into the lungs 2 (two) times daily. 08/07/13   ClanceArmando Reichert, MD  Multiple Vitamin (MULTIVITAMIN WITH MINERALS) TABS Take 1 tablet by mouth daily.    [provider]  mycophenolate (CELLCEPT) 500 MG tablet Take 500 mg by mouth 2 (two) times daily.    [provider]  oxycodone (OXY-IR) 5 MG capsule Take 5 mg by mouth every 6 (six) hours as needed.    [provider]  OXYGEN Inhale into the lungs. Continuous, 4 L/min when exercising and 6 L/min when sitting    [provider]  pantoprazole (PROTONIX) 40 MG tablet Take 40 mg by mouth daily.  01/07/14 05/05/17  [provider]  Polyethyl Glycol-Propyl Glycol 0.4-0.3 % SOLN Apply 1 drop to eye at bedtime as needed (dry).     [provider]  potassium chloride SA (K-DUR,KLOR-CON) 20 MEQ tablet Take 20 mEq by mouth 3 (three) times a week. M-W-F 12/06/13 05/05/17  [provider]  predniSONE (DELTASONE) 10 MG tablet Take 10 mg by mouth daily with breakfast.    [provider]  Probiotic Product (ACIDOPHILUS SUPER PROBIOTIC PO) Take by mouth daily.     [provider]  rifaximin (XIFAXAN) 550 MG TABS tablet Take 1 tablet (550 mg total) by mouth 3 (three) times daily. 08/12/16   Gatha Mayer, MD  sodium chloride HYPERTONIC 3 % nebulizer solution Take 3 mLs by nebulization daily.    [provider]  tadalafil, PAH, (ADCIRCA) 20 MG tablet Take 20 mg by mouth at bedtime.  08/19/15   [provider]  tobramycin, PF,  (TOBI) 300 MG/5ML nebulizer solution Take 300 mg by nebulization 2 (two) times daily.    [provider]  Treprostinil (TYVASO) 0.6 MG/ML SOLN Inhale 18 mcg into the lungs 4 (four) times daily. 1.56m/2.9mL   (12breaths)    [provider]  vancomycin (VANCOCIN) 125 MG capsule Start at 3x/day and taper as directed by Dr. GCarlean Purl7/13/18   GGatha Mayer MD  vitamin B-12 (CYANOCOBALAMIN) 1000 MCG tablet Take 1,000 mcg by mouth 2 (two) times a week. Tues and Thurs    [provider]    Family History Family History  Adopted: Yes    Social History Social History   Tobacco Use  . Smoking status: Never Smoker  . Smokeless tobacco: Never Used  Substance Use Topics  . Alcohol use: No  Alcohol/week: 0.0 standard drinks  . Drug use: No     Allergies   Megace es [megestrol acetate] and Lodine [etodolac]   Review of Systems Review of Systems  Constitutional: Negative for fever.  HENT: Negative for ear pain and sore throat.   Eyes: Negative for pain and visual disturbance.  Respiratory: Positive for shortness of breath.   Cardiovascular: Negative for chest pain and palpitations.  Gastrointestinal: Negative for abdominal pain and vomiting.  Genitourinary: Negative for dysuria and hematuria.  Musculoskeletal: Negative for arthralgias and back pain.  Skin: Negative for color change and rash.  Neurological: Negative for seizures and syncope.  All other systems reviewed and are negative.    Physical Exam Updated Vital Signs BP (!) 163/69   Pulse (!) 111   Temp 98.3 F (36.8 C) (Oral)   Resp (!) 24   SpO2 91%   Physical Exam Vitals signs and nursing note reviewed.  Constitutional:      General: She is in acute distress.     Appearance: She is well-developed. She is ill-appearing.  HENT:     Head: Normocephalic and atraumatic.  Eyes:     Conjunctiva/sclera: Conjunctivae normal.     Pupils: Pupils are equal, round, and reactive to light.   Neck:     Musculoskeletal: Neck supple.  Cardiovascular:     Rate and Rhythm: Normal rate and regular rhythm.     Heart sounds: No murmur.  Pulmonary:     Effort: Pulmonary effort is normal. No respiratory distress.     Breath sounds: Normal breath sounds.  Abdominal:     Palpations: Abdomen is soft.     Tenderness: There is no abdominal tenderness.  Skin:    General: Skin is warm and dry.  Neurological:     Mental Status: She is alert.      ED Treatments / Results  Labs (all labs ordered are listed, but only abnormal results are displayed) Labs Reviewed  CBC WITH DIFFERENTIAL/PLATELET - Abnormal; Notable for the following components:      Result Value   WBC 18.0 (*)    RBC 3.49 (*)    Hemoglobin 9.7 (*)    HCT 34.1 (*)    MCHC 28.4 (*)    RDW 16.2 (*)    Neutro Abs 13.6 (*)    Abs Immature Granulocytes 0.55 (*)    All other components within normal limits  COMPREHENSIVE METABOLIC PANEL - Abnormal; Notable for the following components:   Glucose, Bld 279 (*)    Calcium 8.8 (*)    Albumin 3.3 (*)    All other components within normal limits  BRAIN NATRIURETIC PEPTIDE - Abnormal; Notable for the following components:   B Natriuretic Peptide 424.3 (*)    All other components within normal limits  TROPONIN I - Abnormal; Notable for the following components:   Troponin I 0.12 (*)    All other components within normal limits  TROPONIN I - Abnormal; Notable for the following components:   Troponin I 0.68 (*)    All other components within normal limits  CBG MONITORING, ED - Abnormal; Notable for the following components:   Glucose-Capillary 143 (*)    All other components within normal limits  POCT I-STAT 7, (LYTES, BLD GAS, ICA,H+H) - Abnormal; Notable for the following components:   pCO2 arterial 77.2 (*)    pO2, Arterial 72.0 (*)    Bicarbonate 44.5 (*)    TCO2 47 (*)    Acid-Base Excess 16.0 (*)  Potassium 3.2 (*)    HCT 31.0 (*)    Hemoglobin 10.5 (*)     All other components within normal limits  SARS CORONAVIRUS 2 (HOSPITAL ORDER, [facility])  TROPONIN I  TROPONIN I  CBC  BASIC METABOLIC PANEL  MAGNESIUM    EKG None  Radiology Dg Chest Portable 1 View  Result Date: 07/04/2018 CLINICAL DATA:  Status post CPR EXAM: PORTABLE CHEST 1 VIEW COMPARISON:  04/08/2016 FINDINGS: Postsurgical changes are noted in the left chest wall. Cardiac shadow is stable. Chronic volume loss and consolidation on the left is noted. Postsurgical changes are seen. Mild interstitial changes in the right lung are noted with a vague nodular density in the right lateral lung base. This is new from the prior exam. IMPRESSION: Postsurgical changes on the left. New nodular density in the right lung base. Electronically Signed   By: Inez Catalina M.D.   On: 07/04/2018 13:27    Procedures Procedures (including critical care time)  Medications Ordered in ED Medications  gabapentin (NEURONTIN) capsule 400 mg (has no administration in time range)  mometasone-formoterol (DULERA) 100-5 MCG/ACT inhaler 2 puff (has no administration in time range)  sodium chloride HYPERTONIC 3 % nebulizer solution 3 mL (3 mLs Nebulization Not Given 07/04/18 1734)  oxycodone (OXY-IR) immediate release capsule 5 mg (has no administration in time range)  atorvastatin (LIPITOR) tablet 10 mg (has no administration in time range)  furosemide (LASIX) tablet 40 mg (has no administration in time range)  tadalafil (PAH) (ADCIRCA) tablet 20 mg (has no administration in time range)  Treprostinil (TYVASO) inhalation solution 18 mcg (18 mcg Inhalation Not Given 07/04/18 1738)  DULoxetine (CYMBALTA) DR capsule 60 mg (has no administration in time range)  predniSONE (DELTASONE) tablet 10 mg (has no administration in time range)  pantoprazole (PROTONIX) EC tablet 40 mg (has no administration in time range)  Acidophilus Super Probiotic CAPS (has no administration in time range)   potassium chloride SA (K-DUR) CR tablet 20 mEq (has no administration in time range)  lidocaine (LIDODERM) 5 % 1 patch (has no administration in time range)  Polyethyl Glycol-Propyl Glycol 0.4-0.3 % SOLN 1 drop (has no administration in time range)  sodium chloride (OCEAN) 0.65 % nasal spray 1 spray (has no administration in time range)  enoxaparin (LOVENOX) injection 40 mg (40 mg Subcutaneous Given 07/04/18 1945)  ondansetron (ZOFRAN) tablet 4 mg (has no administration in time range)    Or  ondansetron (ZOFRAN) injection 4 mg (has no administration in time range)  acetaminophen (TYLENOL) tablet 650 mg (has no administration in time range)    Or  acetaminophen (TYLENOL) suppository 650 mg (has no administration in time range)  guaiFENesin (MUCINEX) 12 hr tablet 600 mg (has no administration in time range)  morphine 10 MG/5ML solution 2.5 mg (has no administration in time range)  albuterol (PROVENTIL) (2.5 MG/3ML) 0.083% nebulizer solution 2.5 mg (has no administration in time range)  furosemide (LASIX) injection 40 mg (40 mg Intravenous Given 07/04/18 1331)  methylPREDNISolone sodium succinate (SOLU-MEDROL) 125 mg/2 mL injection 125 mg (125 mg Intravenous Given 07/04/18 1421)     Initial Impression / Assessment and Plan / ED Course  I have reviewed the triage vital signs and the nursing notes.  Pertinent labs & imaging results that were available during my care of the patient were reviewed by me and considered in my medical decision making (see chart for details).  68 year old female with a history of CAD, CHF,  crest syndrome, and pulmonary hypertension presents postarrest.  Patient was being bagged on arrival.  She is making purposeful movements and breathing spontaneously.  Placed on nonrebreather.  She denies chest pain at this time.  Patient confirms that she is DNR and DNI.  Patient care discussed with her husband.  Patient has been sick for some time now.  Patient was told about a year  ago that there is nothing further to do in management of her pulmonary hypertension.  She was not a transplant candidate.  They had discussed palliative care but not yet made that decision.  Patient given Lasix and Solu-Medrol.   Palliative care consulted.  Patient admitted for further management.  Final Clinical Impressions(s) / ED Diagnoses   Final diagnoses:  None    ED Discharge Orders    None       Trinidad Curet, MD 07/04/18 Lang[place] Reusing    Carmin Muskrat, MD 07/07/18 1949

## 2018-07-04 NOTE — ED Notes (Addendum)
Help patient get on the monitor checked cbg it was 143 notified RN of blood sugar patient is restin with nurses at bedside

## 2018-07-04 NOTE — ED Triage Notes (Addendum)
Per EMS: pt from home with husband. Husband called due to pt having trouble breathing. Pt is chronically on 7L 02, has recently increased oxygen to 15L with Sp02 in the 80's upon EMS arrival. Pt became pulseless, EMS initiated CPR, husband found DNR form, CPR stopped. AT that time, pt noted to have pulses and purposeful movement.  Upon pt arrival to ED, EMS was assisting ventilation with BVM.  Pt placed on non-rebreather 15L.   Pt has a HX of CHF and pulmonary HTN.

## 2018-07-04 NOTE — ED Notes (Signed)
Husband at the bedside.

## 2018-07-04 NOTE — Progress Notes (Signed)
ABG results given to ED MD, no new RT orders received.

## 2018-07-04 NOTE — Consult Note (Signed)
Consultation Note Date: 07/04/2018   Patient Name: Deborah [name]  DOB: 14-Oct-1950  MRN: [id number]  Age / Sex: 68 y.o., female  PCP: Crist Infante, MD Referring Physician: Norval Morton, MD  Reason for Consultation: Establishing goals of care and Psychosocial/spiritual support, end of life care.  HPI/Patient Profile: 68 y.o. female  with past medical history of end stage restrictive lung disease on 8L of oxygen at home ([facility]ed [facility] and by Dr. Lamonte Sakai), CREST, CHF, CAD, and Raynauds who was admitted on 07/04/2018 with difficulty breathing which subsequently lead to cardiac arrest.  She received CPR by EMS.  Husband found the DNR form and CPR was stopped.  Patient was noted to have pulses.   She was brought to the ER and placed on a NRB mask with 15L.  A new lung nodule showed up on CXR.  Clinical Assessment and Goals of Care:  I have reviewed medical records including EPIC notes, labs and imaging, received report from Dr. Vanita Panda ER MD, assessed the patient and then met at bedside  to discuss diagnosis prognosis, GOC, disposition and options.  I introduced Palliative Medicine as specialized medical care for people living with serious illness. It focuses on providing relief from the symptoms and stress of a serious illness. The goal is to improve quality of life for both the patient and the family.  We discussed a brief life review of the patient.  Sherlon lives at home with Shanon Brow.  They do not have kids.  She is able to walk to the bathroom on her own. She needs some assistance with bathing and dressing.  Zyiah reports eating 3 small meals a day. She has been battling restrictive lung disease for years and has been evaluated at [facility].  Reportedly the doctors have no new treatments to offer.  She and her husband have accepted that [name] is terminally ill.  They had recently talked with Dr. Joylene Draft about  engaging Palliative or Hospice care after the current COVID climate improves.    Tyese reports that she enjoys life with her husband.  They watch Gun Smoke together on TV.  She believes in God and is a [place].  Mariona reports that she has not had an "episode" in quite awhile.   She does not remember the episode today.  She was unaware she received CPR.  She states she would never want to be put on a ventilator.  Per Shanon Brow his wife has not had an episode for 2 years.  Even though,  they have discussed code status with Dr. Joylene Draft and Carole is DNR.  We briefly discussed the lung nodule.  Per Shanon Brow they would not want this worked up as no treatment would be available for it.  Shanon Brow is interested in [facility] outpatient for Odessie.  We discussed using sub lingual morphine on a PRN basis.  Hospice and Palliative Care services outpatient were explained and offered.  Questions and concerns were addressed.  The family was encouraged to call with questions  or concerns.    Primary Decision Maker:  PATIENT    SUMMARY OF RECOMMENDATIONS     DNR  Will add sublingual morphine to ease SOB as needed.  Continue chronic pain medications   Please request outpatient Hospice Care to follow on discharge.  She meets hospice eligibility under pulmonary criteria.  Currently patient seems to be stabilizing and improving.  PMT will check on the patient tomorrow.   Code Status/Advance Care Planning:  DNR   Additional Recommendations (Limitations, Scope, Preferences):  Full Scope Treatment  Psycho-social/Spiritual:   Desire for further Chaplaincy support: welcomed  Prognosis:  Difficult to determine    Discharge Planning: Home with Hospice      Primary Diagnoses: Present on Admission:  Acute on chronic respiratory failure with hypoxia ([place])   I have reviewed the medical record, interviewed the patient and family, and examined the patient. The following aspects are  pertinent.  Past Medical History:  Diagnosis Date   Anemia    Bronchiectasis (HCC)    C. difficile colitis    CAD (coronary artery disease)    Cataract    CHF (congestive heart failure) (HCC)    right sided heart failure from pulmonary hypertension   COPD (chronic obstructive pulmonary disease) (HCC)    CREST (calcinosis, Raynaud's phenomenon, esophageal dysfunction, sclerodactyly, telangiectasia) (HCC)    Gallstones    Hearing loss    right   Hyperactive airway disease    Hyperlipidemia    Hypertension    Lung cancer ([place]) carcinoid tumor  2004   Osteopenia    Parainfluenza    Pneumonia    Raynaud's disease    Respiratory syncytial virus (RSV) infection    Restrictive lung disease    Shingles    Tubulovillous adenoma of rectum 2007   Social History   Socioeconomic History   Marital status: Married    Spouse name: Shanon Brow   Number of children: 0   Years of education: Not on file   Highest education level: Not on file  Occupational History   Occupation: retired  Scientist, product/process development strain: Not on file   Food insecurity:    Worry: Not on file    Inability: Not on Lexicographer needs:    Medical: Not on file    Non-medical: Not on file  Tobacco Use   Smoking status: Never Smoker   Smokeless tobacco: Never Used  Substance and Sexual Activity   Alcohol use: No    Alcohol/week: 0.0 standard drinks   Drug use: No   Sexual activity: Never  Lifestyle   Physical activity:    Days per week: Not on file    Minutes per session: Not on file   Stress: Not on file  Relationships   Social connections:    Talks on phone: Not on file    Gets together: Not on file    Attends religious service: Not on file    Active member of club or organization: Not on file    Attends meetings of clubs or organizations: Not on file    Relationship status: Not on file  Other Topics Concern   Not on file  Social History  Narrative   The patient is married and retired. Husband Shanon Brow is a Pharmacist, community. No children   Family History  Adopted: Yes   Scheduled Meds: Continuous Infusions: PRN Meds:.morphine Allergies  Allergen Reactions   Megace Es [Megestrol Acetate]     Severe leg cramps  Lodine [Etodolac] Rash    REACTION: rash   Review of Systems reports chronic SOB, Chest pain (from surgeries), and insomnia.  No anorexia, no constipation, no dysuria.   She has some anxiety over breathing and sleep  Physical Exam  Well developed thin, pleasant female, awake, alert, orientated, coherent CV tachycardic, no m/r/g resp on NRB at 15L no w/c/r  Vital Signs: BP (!) 123/57    Pulse (!) 124    Temp 97.6 F (36.4 C) (Tympanic)    Resp (!) 30    SpO2 100%  Pain Scale: 0-10   Pain Score: 0-No pain   SpO2: SpO2: 100 % O2 Device:SpO2: 100 % O2 Flow Rate: .O2 Flow Rate (L/min): 15 L/min  IO: Intake/output summary:   Intake/Output Summary (Last 24 hours) at 07/04/2018 1522 Last data filed at 07/04/2018 1415 Gross per 24 hour  Intake --  Output 400 ml  Net -400 ml    LBM:   Baseline Weight:   Most recent weight:       Palliative Assessment/Data: 50%     Time In:2:30 Time Out: 3:30 Time Total: 60 min. Greater than 50%  of this time was spent counseling and coordinating care related to the above assessment and plan.  Signed by: Florentina Jenny, PA-C Palliative Medicine Pager: [phone]  Please contact Palliative Medicine Team phone at [phone] for questions and concerns.  For individual provider: See Shea Evans

## 2018-07-04 NOTE — Progress Notes (Signed)
Responded to Ed page to support patient and husband.  Patientt is a post CPR. Husband at bedside to visit with patient due to palliative care diagnosis.  Dr. Damaris Schooner with husband .  Patient to be admitted.  Provided emotional and presence to husband.  Supported Biochemist, clinical as needed.  Will follow as needed.  Jaclynn Major, [place], [facility], Pager [phone]

## 2018-07-04 NOTE — H&P (Addendum)
History and Physical    Deborah [name]:[id number] DOB: [birth year] DOA: 07/04/2018  Referring MD/NP/PA: Trinidad Curet, MD (resident PCP: Crist Infante, MD  Patient coming from: Via EMS  Chief Complaint: Shortness of breath  I have personally briefly reviewed patient's old medical records in [place]   HPI: Deborah [name] is a 68 y.o. female with medical history significant of restrictive lung disease/bronchiectasis s/p partial lobectomy , chronic respiratory failure with hypoxia on 8 L nasal cannula oxygen, CREST/scleroderma, HTN, HLD, and CAD; who presents with complaints of shortness breath.  Apparently patient chronically has shortness of breath, but had reportedly pushed herself a little harder than normal and became acutely short of breath.  The patient's husband reported trying to increase nasal cannula oxygen to 10 L and placed on a nonrebreather without improvement in O2 saturations which were noted to drop down into the 70s.  He reported that she turned blue in color.  Upon EMS arrival, no pulse was appreciated and CPR was begun.  Patient husband found her to not resuscitate form and CPR was stopped at that time and patient was noted to have pulses.  She was brought into the ER on a nonrebreather mask with 15 L of nasal cannula oxygen.  She does complain of some chest discomfort, nasal congestion, and periodic limb movements. Patient no longer on Cellcept due to recurrent infections.   ED Course: Upon admission to the emergency department patient was noted to be afebrile, pulse 117 - 135, respirations 26-34, blood pressures 118/88-180 5/148, and O2 saturations 90-100% on a nonrebreather.  ABG revealed pH 7.366, PCO2 77.2, and PO2 72. Labs revealed WBC 18, hemoglobin 9.7, glucose 279, BNP 424.3,  and troponin 0.12.  Chest x-ray showed showed postsurgical changes of the left lung with new nodular lesion on right lung base. Patient had been given 125 mg of Solu-Medrol IV and 40  mg of Lasix IV.  Palliative care was consulted.  Morphine offered as needed for shortness of breath and pain.  Patient is to remain DO NOT RESUSCITATE and they are not want to have intubation.  Review of Systems  Constitutional: Negative for chills and fever.  HENT: Positive for congestion and hearing loss.   Eyes: Negative for photophobia.  Respiratory: Positive for cough, sputum production and shortness of breath. Negative for wheezing.   Cardiovascular: Positive for chest pain. Negative for leg swelling.  Gastrointestinal: Negative for abdominal pain, nausea and vomiting.  Genitourinary: Negative for flank pain and urgency.  Musculoskeletal: Positive for joint pain and myalgias.  Skin: Negative for rash.  Neurological: Positive for loss of consciousness and weakness. Negative for focal weakness.  Psychiatric/Behavioral: Negative for memory loss and substance abuse. The patient is nervous/anxious.     Past Medical History:  Diagnosis Date   Anemia    Bronchiectasis (HCC)    C. difficile colitis    CAD (coronary artery disease)    Cataract    CHF (congestive heart failure) ([place])    right sided heart failure from pulmonary hypertension   COPD (chronic obstructive pulmonary disease) (HCC)    CREST (calcinosis, Raynaud's phenomenon, esophageal dysfunction, sclerodactyly, telangiectasia) (HCC)    Gallstones    Hearing loss    right   Hyperactive airway disease    Hyperlipidemia    Hypertension    Lung cancer ([place]) carcinoid tumor  2004   Osteopenia    Parainfluenza    Pneumonia    Raynaud's disease    Respiratory syncytial virus (  RSV) infection    Restrictive lung disease    Shingles    Tubulovillous adenoma of rectum 2007    Past Surgical History:  Procedure Laterality Date   broncho pleural fistula  2004   COLONOSCOPY     pulmonary carcinoid  2004   thoracotomies  2004   VIDEO BRONCHOSCOPY  12/07/2011   Procedure: VIDEO BRONCHOSCOPY WITHOUT  FLUORO;  Surgeon: Kathee Delton, MD;  Location: [facility];  Service: Cardiopulmonary;  Laterality: Bilateral;     reports that she has never smoked. She has never used smokeless tobacco. She reports that she does not drink alcohol or use drugs.  Allergies  Allergen Reactions   Megace Es [Megestrol Acetate]     Severe leg cramps    Lodine [Etodolac] Rash    REACTION: rash    Family History  Adopted: Yes    Prior to Admission medications   Medication Sig Start Date End Date Taking? Authorizing Provider  albuterol (PROVENTIL HFA;VENTOLIN HFA) 108 (90 BASE) MCG/ACT inhaler Inhale 2 puffs into the lungs every 6 (six) hours as needed. For shortness of breath. 10/26/13   Clance, Armando Reichert, MD  atorvastatin (LIPITOR) 10 MG tablet Take 10 mg by mouth daily.    [provider]  cholecalciferol (VITAMIN D) 1000 units tablet Take 2,000 Units by mouth 2 (two) times a week.    [provider]  denosumab (PROLIA) 60 MG/ML SOLN injection Inject 60 mg into the skin every 6 (six) months. Administer in upper arm, thigh, or abdomen    [provider]  DULoxetine (CYMBALTA) 60 MG capsule Take 60 mg by mouth every evening.     [provider]  furosemide (LASIX) 40 MG tablet Take 40 mg by mouth 3 (three) times a week. M-W-F 12/06/13 05/05/17  [provider]  gabapentin (NEURONTIN) 400 MG capsule Take 400 mg by mouth 4 (four) times daily.    [provider]  hyoscyamine (LEVSIN SL) 0.125 MG SL tablet DISSOLVE 1 TABLET(0.125 MG) UNDER THE TONGUE EVERY 6 HOURS AS NEEDED 05/24/16   Zehr, Janett Billow D, PA-C  lidocaine (LIDODERM) 5 % Place 1 patch onto the skin daily. Remove & Discard patch within 12 hours or as directed by MD    [provider]  mometasone-formoterol (DULERA) 100-5 MCG/ACT AERO Inhale 2 puffs into the lungs 2 (two) times daily. 08/07/13   ClanceArmando Reichert, MD  Multiple Vitamin (MULTIVITAMIN WITH MINERALS) TABS Take 1 tablet by mouth daily.     [provider]  mycophenolate (CELLCEPT) 500 MG tablet Take 500 mg by mouth 2 (two) times daily.    [provider]  oxycodone (OXY-IR) 5 MG capsule Take 5 mg by mouth every 6 (six) hours as needed.    [provider]  OXYGEN Inhale into the lungs. Continuous, 4 L/min when exercising and 6 L/min when sitting    [provider]  pantoprazole (PROTONIX) 40 MG tablet Take 40 mg by mouth daily.  01/07/14 05/05/17  [provider]  Polyethyl Glycol-Propyl Glycol 0.4-0.3 % SOLN Apply 1 drop to eye at bedtime as needed (dry).     [provider]  potassium chloride SA (K-DUR,KLOR-CON) 20 MEQ tablet Take 20 mEq by mouth 3 (three) times a week. M-W-F 12/06/13 05/05/17  [provider]  predniSONE (DELTASONE) 10 MG tablet Take 10 mg by mouth daily with breakfast.    [provider]  Probiotic Product (ACIDOPHILUS SUPER PROBIOTIC PO) Take by mouth daily.  [provider]  rifaximin (XIFAXAN) 550 MG TABS tablet Take 1 tablet (550 mg total) by mouth 3 (three) times daily. 08/12/16   Gatha Mayer, MD  sodium chloride HYPERTONIC 3 % nebulizer solution Take 3 mLs by nebulization daily.    [provider]  tadalafil, PAH, (ADCIRCA) 20 MG tablet Take 20 mg by mouth at bedtime.  08/19/15   [provider]  tobramycin, PF, (TOBI) 300 MG/5ML nebulizer solution Take 300 mg by nebulization 2 (two) times daily.    [provider]  Treprostinil (TYVASO) 0.6 MG/ML SOLN Inhale 18 mcg into the lungs 4 (four) times daily. 1.8m/2.9mL   (12breaths)    [provider]  vancomycin (VANCOCIN) 125 MG capsule Start at 3x/day and taper as directed by Dr. GCarlean Purl7/13/18   GGatha Mayer MD  vitamin B-12 (CYANOCOBALAMIN) 1000 MCG tablet Take 1,000 mcg by mouth 2 (two) times a week. Tues and Thurs    [provider]    Physical Exam:  Constitutional: NAD, calm, comfortable Vitals:   07/04/18 1330  07/04/18 1345 07/04/18 1400 07/04/18 1415  BP: (!) 177/86 (!) 156/72 118/88 (!) 123/57  Pulse: (!) 130 (!) 127 (!) 125 (!) 124  Resp: (!) 32 (!) 33 (!) 34 (!) 30  Temp:      TempSrc:      SpO2: 100% 100% 100% 100%   Eyes: PERRL, lids and conjunctivae normal ENMT: Mucous membranes are moist. Posterior pharynx clear of any exudate or lesions.  Neck: normal, supple, no masses, no thyromegaly Respiratory: tachypneic with decreased aeration on NRB mask at 15L. No wheezes appreciated. Cardiovascular: tachycardia, no murmurs / rubs / gallops. No extremity edema. 2+ pedal pulses. No carotid bruits.  Abdomen: no tenderness, no masses palpated. No hepatosplenomegaly. Bowel sounds positive.  Musculoskeletal: no clubbing / cyanosis. No joint deformity upper and lower extremities. Good ROM, no contractures. Normal muscle tone.  Skin: raynaud's color change noted of left index finger. Neurologic: CN 2-12 grossly intact. Sensation intact, DTR normal. Strength 5/5 in all 4.  Psychiatric: Normal judgment and insight. Alert and oriented x 3. Normal mood.     Labs on Admission: I have personally reviewed following labs and imaging studies  CBC: Recent Labs  Lab 07/04/18 1316 07/04/18 1416  WBC 18.0*  --   NEUTROABS 13.6*  --   HGB 9.7* 10.5*  HCT 34.1* 31.0*  MCV 97.7  --   PLT 341  --    Basic Metabolic Panel: Recent Labs  Lab 07/04/18 1316 07/04/18 1416  NA 144 142  K 3.8 3.2*  CL 100  --   CO2 32  --   GLUCOSE 279*  --   BUN 20  --   CREATININE 0.85  --   CALCIUM 8.8*  --    GFR: CrCl cannot be calculated (Unknown ideal weight.). Liver Function Tests: Recent Labs  Lab 07/04/18 1316  AST 41  ALT 34  ALKPHOS 62  BILITOT 0.5  PROT 6.6  ALBUMIN 3.3*   No results for input(s): LIPASE, AMYLASE in the last 168 hours. No results for input(s): AMMONIA in the last 168 hours. Coagulation Profile: No results for input(s): INR, PROTIME in the last 168 hours. Cardiac  Enzymes: Recent Labs  Lab 07/04/18 1316  TROPONINI 0.12*   BNP (last 3 results) No results for input(s): PROBNP in the last 8760 hours. HbA1C: No results for input(s): HGBA1C in the last 72 hours. CBG: Recent Labs  Lab 07/04/18 1309  GLUCAP 143*   Lipid Profile: No results for input(s): CHOL, HDL, LDLCALC, TRIG, CHOLHDL, LDLDIRECT in the last 72 hours. Thyroid Function Tests: No results for input(s): TSH, T4TOTAL, FREET4, T3FREE, THYROIDAB in the last 72 hours. Anemia Panel: No results for input(s): VITAMINB12, FOLATE, FERRITIN, TIBC, IRON, RETICCTPCT in the last 72 hours. Urine analysis:    Component Value Date/Time   COLORURINE YELLOW 06/03/2012 2131   APPEARANCEUR CLEAR 06/03/2012 2131   LABSPEC 1.010 06/03/2012 2131   PHURINE 6.5 06/03/2012 2131   GLUCOSEU NEGATIVE 06/03/2012 2131   HGBUR NEGATIVE 06/03/2012 2131   HGBUR negative 02/25/2010 0917   BILIRUBINUR NEGATIVE 06/03/2012 2131   BILIRUBINUR n 03/01/2011 [place] 06/03/2012 2131   PROTEINUR NEGATIVE 06/03/2012 2131   UROBILINOGEN 0.2 06/03/2012 2131   NITRITE NEGATIVE 06/03/2012 2131   LEUKOCYTESUR NEGATIVE 06/03/2012 2131   Sepsis Labs: No results found for this or any previous visit (from the past 240 hour(s)).   Radiological Exams on Admission: Dg Chest Portable 1 View  Result Date: 07/04/2018 CLINICAL DATA:  Status post CPR EXAM: PORTABLE CHEST 1 VIEW COMPARISON:  04/08/2016 FINDINGS: Postsurgical changes are noted in the left chest wall. Cardiac shadow is stable. Chronic volume loss and consolidation on the left is noted. Postsurgical changes are seen. Mild interstitial changes in the right lung are noted with a vague nodular density in the right lateral lung base. This is new from the prior exam. IMPRESSION: Postsurgical changes on the left. New nodular density in the right lung base. Electronically Signed   By: Inez Catalina M.D.   On: 07/04/2018 13:27    EKG: Independently reviewed.   Sinus tachycardia at 132 bpm.  Assessment/Plan Respiratory failure with hypoxia and hypercapneia, End stage lung disease with bronchiectasis: Acute on chronic.  Patient presents with acute onset of shortness of breath with hypoxia.  Chest x-ray showing signs of new nodular density in the right lung base.  She was found to be COVID-19 negative. Patient with end stage lung disease not amendable to transplant followed at [facility].  -Admit to a progressive bed -Continuous pulse oximetry and plan to wean  back to home oxygen requirements  -Check sputum culture -Continue home breathing treatments of Dulera, 3% saline, and albuterol inhaler -Albuterol nebs as needed  -Appreciate palliative care consultative services, continue oral morphine as needed for ease of breathing   Elevated troponin, s/p CPR: Troponin noted to be elevated 0.12. Patient reported have witnessed cardiac arrested on arrival of EMS and CPR was started prior to being known that she was DNR. Thereafter noted to have a pulse. -Trend troponin  Scleroderma/ CREST, pulmonary artery hypertension: chronic. -Continue Tyvaso, Tadalafil  Chronic pain -Continue Dulera, oxycodone as needed for pain, and lidocaine patch  Peripheral neuropathy -Continue gabapentin  Right lung nodule as seen on CT, H/O carcinoid tumor of the left Lung: Patient found to have new nodule on the right lung on CXR.   DVT prophylaxis: Lovenox Code Status: DNR/DNI Family Communication: Discussed plan of care with the patient family present at bedside Disposition Plan: Possible discharge home in 1 to 2 days Consults called: none Admission status: Observation  Norval Morton MD [facility] Pager [phone]   If 7PM-7AM, please contact night-coverage [email or web address] Password [facility]  07/04/2018, 3:42 PM

## 2018-07-04 NOTE — ED Notes (Signed)
Pts husband at bedside to visit with patient due to palliative care diagnosis.

## 2018-07-04 NOTE — ED Notes (Addendum)
ED TO INPATIENT HANDOFF REPORT  ED Nurse Name and Phone #:   S Name/Age/Gender Deborah [name] 68 y.o. female Room/Bed: TRAAC/TRAAC  Code Status   Code Status: Prior  Home/SNF/Other Home Patient oriented to: self, place, time and situation Is this baseline? Yes   Triage Complete: Triage complete  Chief Complaint Post cpr  Triage Note Per EMS: pt from home with husband. Husband called due to pt having trouble breathing. Pt is chronically on 7L 02, has recently increased oxygen to 15L with Sp02 in the 80's upon EMS arrival. Pt became pulseless, EMS initiated CPR, husband found DNR form, CPR stopped. AT that time, pt noted to have pulses and purposeful movement.  Upon pt arrival to ED, EMS was assisting ventilation with BVM.  Pt placed on non-rebreather 15L.   Pt has a HX of CHF and pulmonary HTN.     Allergies Allergies  Allergen Reactions  . Megace Es [Megestrol Acetate]     Severe leg cramps   . Lodine [Etodolac] Rash    REACTION: rash    Level of Care/Admitting Diagnosis ED Disposition    ED Disposition Condition [place] [facility]: [place] [[id number]]  Level of Care: Progressive [102]  I expect the patient will be discharged within 24 hours: No (not a candidate for 5C-Observation unit)  Covid Evaluation: Screening Protocol (No Symptoms)  Diagnosis: Acute on chronic respiratory failure with hypoxia [facility]) [[id number]]  Admitting Physician: Norval Morton [[id number]]  Attending Physician: Norval Morton [[id number]]  PT Class (Do Not Modify): Observation [104]  PT Acc Code (Do Not Modify): Observation [10022]       B Medical/Surgery History Past Medical History:  Diagnosis Date  . Anemia   . Bronchiectasis ([place])   . C. difficile colitis   . CAD (coronary artery disease)   . Cataract   . CHF (congestive heart failure) (HCC)    right sided heart failure from pulmonary hypertension  . COPD (chronic obstructive pulmonary disease)  ([place])   . CREST (calcinosis, Raynaud's phenomenon, esophageal dysfunction, sclerodactyly, telangiectasia) ([place])   . Gallstones   . Hearing loss    right  . Hyperactive airway disease   . Hyperlipidemia   . Hypertension   . Lung cancer ([place]) carcinoid tumor  2004  . Osteopenia   . Parainfluenza   . Pneumonia   . Raynaud's disease   . Respiratory syncytial virus (RSV) infection   . Restrictive lung disease   . Shingles   . Tubulovillous adenoma of rectum 2007   Past Surgical History:  Procedure Laterality Date  . broncho pleural fistula  2004  . COLONOSCOPY    . pulmonary carcinoid  2004  . thoracotomies  2004  . VIDEO BRONCHOSCOPY  12/07/2011   Procedure: VIDEO BRONCHOSCOPY WITHOUT FLUORO;  Surgeon: Kathee Delton, MD;  Location: [facility];  Service: Cardiopulmonary;  Laterality: Bilateral;     A IV Location/Drains/Wounds Patient Lines/Drains/Airways Status   Active Line/Drains/Airways    Name:   Placement date:   Placement time:   Site:   Days:   Peripheral IV 07/04/18 Left Forearm   07/04/18    1311    Forearm   less than 1   Peripheral IV 07/04/18 Left Antecubital   07/04/18    1312    Antecubital   less than 1          Intake/Output Last 24 hours  Intake/Output Summary (Last 24 hours) at 07/04/2018 1538 Last  data filed at 07/04/2018 1415 Gross per 24 hour  Intake -  Output 400 ml  Net -400 ml    Labs/Imaging Results for orders placed or performed during the hospital encounter of 07/04/18 (from the past 48 hour(s))  CBG monitoring, ED     Status: Abnormal   Collection Time: 07/04/18  1:09 PM  Result Value Ref Range   Glucose-Capillary 143 (H) 70 - 99 mg/dL  CBC with Differential     Status: Abnormal   Collection Time: 07/04/18  1:16 PM  Result Value Ref Range   WBC 18.0 (H) 4.0 - 10.5 K/uL   RBC 3.49 (L) 3.87 - 5.11 MIL/uL   Hemoglobin 9.7 (L) 12.0 - 15.0 g/dL   HCT 34.1 (L) 36.0 - 46.0 %   MCV 97.7 80.0 - 100.0 fL   MCH 27.8 26.0 - 34.0 pg   MCHC  28.4 (L) 30.0 - 36.0 g/dL   RDW 16.2 (H) 11.5 - 15.5 %   Platelets 341 150 - 400 K/uL   nRBC 0.1 0.0 - 0.2 %   Neutrophils Relative % 76 %   Neutro Abs 13.6 (H) 1.7 - 7.7 K/uL   Lymphocytes Relative 14 %   Lymphs Abs 2.6 0.7 - 4.0 K/uL   Monocytes Relative 6 %   Monocytes Absolute 1.0 0.1 - 1.0 K/uL   Eosinophils Relative 1 %   Eosinophils Absolute 0.3 0.0 - 0.5 K/uL   Basophils Relative 0 %   Basophils Absolute 0.1 0.0 - 0.1 K/uL   Immature Granulocytes 3 %   Abs Immature Granulocytes 0.55 (H) 0.00 - 0.07 K/uL    Comment: Performed at [facility], [address] [place], [place] [address]  Comprehensive metabolic panel     Status: Abnormal   Collection Time: 07/04/18  1:16 PM  Result Value Ref Range   Sodium 144 135 - 145 mmol/L   Potassium 3.8 3.5 - 5.1 mmol/L   Chloride 100 98 - 111 mmol/L   CO2 32 22 - 32 mmol/L   Glucose, Bld 279 (H) 70 - 99 mg/dL   BUN 20 8 - 23 mg/dL   Creatinine, Ser 0.85 0.44 - 1.00 mg/dL   Calcium 8.8 (L) 8.9 - 10.3 mg/dL   Total Protein 6.6 6.5 - 8.1 g/dL   Albumin 3.3 (L) 3.5 - 5.0 g/dL   AST 41 15 - 41 U/L   ALT 34 0 - 44 U/L   Alkaline Phosphatase 62 38 - 126 U/L   Total Bilirubin 0.5 0.3 - 1.2 mg/dL   GFR calc non Af Amer >60 >60 mL/min   GFR calc Af Amer >60 >60 mL/min   Anion gap 12 5 - 15    Comment: Performed at [facility] [address] [employer], [place] [address]  Troponin I - ONCE - STAT     Status: Abnormal   Collection Time: 07/04/18  1:16 PM  Result Value Ref Range   Troponin I 0.12 (HH) <0.03 ng/mL    Comment: CRITICAL RESULT CALLED TO, READ BACK BY AND VERIFIED WITH: C.Naji Mehringer RN [phone] 07/04/2018 MCCORMICK K Performed at [facility], [place] [address], [place], [place] [address]   Brain natriuretic peptide     Status: Abnormal   Collection Time: 07/04/18  1:18 PM  Result Value Ref Range   B Natriuretic Peptide 424.3 (H) 0.0 - 100.0 pg/mL    Comment: Performed at [facility] [address],  [place], [place]  [address]  I-STAT 7, (LYTES, BLD GAS, ICA, H+H)     Status: Abnormal   Collection Time: 07/04/18  2:16 PM  Result Value Ref Range   pH, Arterial 7.366 7.350 - 7.450   pCO2 arterial 77.2 (HH) 32.0 - 48.0 mmHg   pO2, Arterial 72.0 (L) 83.0 - 108.0 mmHg   Bicarbonate 44.5 (H) 20.0 - 28.0 mmol/L   TCO2 47 (H) 22 - 32 mmol/L   O2 Saturation 93.0 %   Acid-Base Excess 16.0 (H) 0.0 - 2.0 mmol/L   Sodium 142 135 - 145 mmol/L   Potassium 3.2 (L) 3.5 - 5.1 mmol/L   Calcium, Ion 1.21 1.15 - 1.40 mmol/L   HCT 31.0 (L) 36.0 - 46.0 %   Hemoglobin 10.5 (L) 12.0 - 15.0 g/dL   Patient temperature 97.6 F    Collection site RADIAL, ALLEN'S TEST ACCEPTABLE    Drawn by Operator    Sample type ARTERIAL    Comment NOTIFIED PHYSICIAN    Dg Chest Portable 1 View  Result Date: 07/04/2018 CLINICAL DATA:  Status post CPR EXAM: PORTABLE CHEST 1 VIEW COMPARISON:  04/08/2016 FINDINGS: Postsurgical changes are noted in the left chest wall. Cardiac shadow is stable. Chronic volume loss and consolidation on the left is noted. Postsurgical changes are seen. Mild interstitial changes in the right lung are noted with a vague nodular density in the right lateral lung base. This is new from the prior exam. IMPRESSION: Postsurgical changes on the left. New nodular density in the right lung base. Electronically Signed   By: Inez Catalina M.D.   On: 07/04/2018 13:27    Pending Labs Unresulted Labs (From admission, onward)    Start     Ordered   07/04/18 1422  SARS Coronavirus 2 (CEPHEID- Performed in [place] hospital lab), Hosp Order  (Symptomatic Patients Labs with Precautions )  Once,   R    Question:  Patient immune status  Answer:  Immunocompromised   07/04/18 1422   07/04/18 1316  Blood gas, arterial (at [facility])  ONCE - STAT,   STAT    Comments:  15L NRB   Question:  Room air or oxygen  Answer:  Oxygen   07/04/18 1316          Vitals/Pain Today's Vitals   07/04/18 1330 07/04/18 1345 07/04/18  1400 07/04/18 1415  BP: (!) 177/86 (!) 156/72 118/88 (!) 123/57  Pulse: (!) 130 (!) 127 (!) 125 (!) 124  Resp: (!) 32 (!) 33 (!) 34 (!) 30  Temp:      TempSrc:      SpO2: 100% 100% 100% 100%  PainSc:        Isolation Precautions Droplet and Contact precautions  Medications Medications  morphine 10 MG/5ML solution 2.5 mg (has no administration in time range)  furosemide (LASIX) injection 40 mg (40 mg Intravenous Given 07/04/18 1331)  methylPREDNISolone sodium succinate (SOLU-MEDROL) 125 mg/2 mL injection 125 mg (125 mg Intravenous Given 07/04/18 1421)    Mobility walks Moderate fall risk   Focused Assessments Cardiac Assessment Handoff:  Cardiac Rhythm: Sinus tachycardia Lab Results  Component Value Date   TROPONINI 0.12 ([place]) 07/04/2018   No results found for: DDIMER Does the Patient currently have chest pain? 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  Left thoracotomy for repair of bronchopleural fistula and intercostal muscle  flap on 06/28/2003.     Pt on high flow [place] 10L

## 2018-07-05 DIAGNOSIS — I469 Cardiac arrest, cause unspecified: Secondary | ICD-10-CM | POA: Diagnosis present

## 2018-07-05 DIAGNOSIS — J849 Interstitial pulmonary disease, unspecified: Secondary | ICD-10-CM | POA: Diagnosis present

## 2018-07-05 DIAGNOSIS — G4700 Insomnia, unspecified: Secondary | ICD-10-CM | POA: Diagnosis present

## 2018-07-05 DIAGNOSIS — J479 Bronchiectasis, uncomplicated: Secondary | ICD-10-CM | POA: Diagnosis not present

## 2018-07-05 DIAGNOSIS — F411 Generalized anxiety disorder: Secondary | ICD-10-CM

## 2018-07-05 DIAGNOSIS — Z66 Do not resuscitate: Secondary | ICD-10-CM | POA: Diagnosis present

## 2018-07-05 DIAGNOSIS — J9621 Acute and chronic respiratory failure with hypoxia: Secondary | ICD-10-CM | POA: Diagnosis present

## 2018-07-05 DIAGNOSIS — Z85118 Personal history of other malignant neoplasm of bronchus and lung: Secondary | ICD-10-CM | POA: Diagnosis not present

## 2018-07-05 DIAGNOSIS — J449 Chronic obstructive pulmonary disease, unspecified: Secondary | ICD-10-CM | POA: Diagnosis present

## 2018-07-05 DIAGNOSIS — Z515 Encounter for palliative care: Secondary | ICD-10-CM | POA: Diagnosis present

## 2018-07-05 DIAGNOSIS — J984 Other disorders of lung: Secondary | ICD-10-CM | POA: Diagnosis present

## 2018-07-05 DIAGNOSIS — I2510 Atherosclerotic heart disease of native coronary artery without angina pectoris: Secondary | ICD-10-CM | POA: Diagnosis present

## 2018-07-05 DIAGNOSIS — H9191 Unspecified hearing loss, right ear: Secondary | ICD-10-CM | POA: Diagnosis present

## 2018-07-05 DIAGNOSIS — R911 Solitary pulmonary nodule: Secondary | ICD-10-CM | POA: Diagnosis present

## 2018-07-05 DIAGNOSIS — I2721 Secondary pulmonary arterial hypertension: Secondary | ICD-10-CM | POA: Diagnosis present

## 2018-07-05 DIAGNOSIS — M341 CR(E)ST syndrome: Secondary | ICD-10-CM | POA: Diagnosis present

## 2018-07-05 DIAGNOSIS — G629 Polyneuropathy, unspecified: Secondary | ICD-10-CM | POA: Diagnosis present

## 2018-07-05 DIAGNOSIS — G8929 Other chronic pain: Secondary | ICD-10-CM | POA: Diagnosis present

## 2018-07-05 DIAGNOSIS — J9622 Acute and chronic respiratory failure with hypercapnia: Secondary | ICD-10-CM | POA: Diagnosis present

## 2018-07-05 DIAGNOSIS — I110 Hypertensive heart disease with heart failure: Secondary | ICD-10-CM | POA: Diagnosis present

## 2018-07-05 DIAGNOSIS — D3A090 Benign carcinoid tumor of the bronchus and lung: Secondary | ICD-10-CM | POA: Diagnosis present

## 2018-07-05 DIAGNOSIS — E785 Hyperlipidemia, unspecified: Secondary | ICD-10-CM | POA: Diagnosis present

## 2018-07-05 DIAGNOSIS — M8580 Other specified disorders of bone density and structure, unspecified site: Secondary | ICD-10-CM | POA: Diagnosis present

## 2018-07-05 DIAGNOSIS — I50810 Right heart failure, unspecified: Secondary | ICD-10-CM | POA: Diagnosis present

## 2018-07-05 DIAGNOSIS — Z20828 Contact with and (suspected) exposure to other viral communicable diseases: Secondary | ICD-10-CM | POA: Diagnosis present

## 2018-07-05 LAB — CBC
HCT: 31.2 % — ABNORMAL LOW (ref 36.0–46.0)
Hemoglobin: 9.4 g/dL — ABNORMAL LOW (ref 12.0–15.0)
MCH: 27.6 pg (ref 26.0–34.0)
MCHC: 30.1 g/dL (ref 30.0–36.0)
MCV: 91.5 fL (ref 80.0–100.0)
Platelets: 222 10*3/uL (ref 150–400)
RBC: 3.41 MIL/uL — ABNORMAL LOW (ref 3.87–5.11)
RDW: 16.2 % — ABNORMAL HIGH (ref 11.5–15.5)
WBC: 9.8 10*3/uL (ref 4.0–10.5)
nRBC: 0 % (ref 0.0–0.2)

## 2018-07-05 LAB — TROPONIN I
Troponin I: 0.75 ng/mL (ref <0.03)
Troponin I: 0.67 ng/mL (ref <0.03)

## 2018-07-05 LAB — BASIC METABOLIC PANEL
Anion gap: 13 (ref 5–15)
BUN: 26 mg/dL — ABNORMAL HIGH (ref 8–23)
CO2: 42 mmol/L — ABNORMAL HIGH (ref 22–32)
Calcium: 9.6 mg/dL (ref 8.9–10.3)
Chloride: 90 mmol/L — ABNORMAL LOW (ref 98–111)
Creatinine, Ser: 0.94 mg/dL (ref 0.44–1.00)
GFR calc Af Amer: >60 mL/min (ref >60)
GFR calc non Af Amer: >60 mL/min (ref >60)
Glucose, Bld: 128 mg/dL — ABNORMAL HIGH (ref 70–99)
Potassium: 4.4 mmol/L (ref 3.5–5.1)
Sodium: 145 mmol/L (ref 135–145)

## 2018-07-05 LAB — MAGNESIUM: Magnesium: 2.1 mg/dL (ref 1.7–2.4)

## 2018-07-05 MED ORDER — LORAZEPAM 0.5 MG PO TABS: 0.5000 mg | ORAL_TABLET | Freq: Two times a day (BID) | ORAL | Status: DC | PRN

## 2018-07-05 MED ORDER — LORAZEPAM 0.5 MG PO TABS
0.2500 mg | ORAL_TABLET | Freq: Two times a day (BID) | ORAL | Status: DC | PRN
  Administered 2018-07-05: 0.25 mg via ORAL
  Filled 2018-07-05: qty 1

## 2018-07-05 NOTE — Progress Notes (Signed)
Patient c/o left chest pain and increased SOB. Administered PRN Morphine as ordered. VS stable will continue to monitor. Palliative Care into see patient at this time.

## 2018-07-05 NOTE — Progress Notes (Signed)
Hydrologist [facility])  Hospital Liaison: RN visit    Notified by Florentina Jenny, PA that patient is interested in having Palliative support at home after discharge.    Writer spoke with patient and her husband by phone to explain services and answer questions.     Hospital liaison will follow up with patient for anticipated discharge and arrange first appointment with palliative team.    Please call with any palliative related questions.     Thank you for this referral.     Farrel Gordon, RN, CCM  [place] (listed on AMION under Hospice and [place] [facility] [place])  [phone]

## 2018-07-05 NOTE — Progress Notes (Signed)
[facility]  PROGRESS NOTE    Deborah [name]  WCB:[id number] DOB: 1950/11/08 DOA: 07/04/2018 PCP: Crist Infante, MD   Brief Narrative:   Deborah [name] is a 68 y.o. female with medical history significant of restrictive lung disease/bronchiectasis s/p partial lobectomy , chronic respiratory failure with hypoxia on 8 L nasal cannula oxygen, CREST/scleroderma, HTN, HLD, and CAD; who presents with complaints of shortness breath.  Apparently patient chronically has shortness of breath, but had reportedly pushed herself a little harder than normal and became acutely short of breath.  The patient's husband reported trying to increase nasal cannula oxygen to 10 L and placed on a nonrebreather without improvement in O2 saturations which were noted to drop down into the 70s.  He reported that she turned blue in color.  Upon EMS arrival, no pulse was appreciated and CPR was begun.  Patient husband found her to not resuscitate form and CPR was stopped at that time and patient was noted to have pulses.  She was brought into the ER on a nonrebreather mask with 15 L of nasal cannula oxygen.  She does complain of some chest discomfort, nasal congestion, and periodic limb movements. Patient no longer on Cellcept due to recurrent infections.   Assessment & Plan:   Principal Problem:   Acute on chronic respiratory failure with hypoxia (HCC) Active Problems:   Bronchiectasis without acute exacerbation (HCC)   Carcinoid tumor of left lung   Chronic pain   ILD (interstitial lung disease) (HCC)   Cardiac arrest (HCC)   CREST (calcinosis, Raynaud's phenomenon, esophageal dysfunction, sclerodactyly, telangiectasia) (HCC)   Anxiety state   Acute on chronic respiratory failure w/ hypoxia/hypercapniea     - Hx of bronchiectasis, pHTN, end stage lung disease     - COVID negative     - continue dulera, 3% saline, albuterol; PRN albuterol     - currently DNR     - working on outpt hospice  Scleroderma/CREST/pHTN     -  tyvaso, tadalafil     - see above  Chronic pain/peripheral neuropathy     - PRN oxy, continue gabapentin     - worsened by CPR, monitor  Right lung nodule as seen on CXR     - hx of carcinoid tumor of left lung     - patient looking at outpt hospice at this time; awaiting final decision on w/u  Anxious about end of life discussion. Expecting home with hospice. Continue as above.   DVT prophylaxis: lovenox Code Status: DNR Family Communication: Family at bedside   Disposition Plan: Expect home with hospice   Consultants:   Palliative Care  Procedures:   None   Subjective: "How will I die?"  Objective: Vitals:   07/05/18 1120 07/05/18 1123 07/05/18 1200 07/05/18 1703  BP:   (!) 174/82 (!) 157/88  Pulse: (!) 112 (!) 116 (!) 111 (!) 107  Resp: (!) 25 (!) 24 (!) 26 (!) 21  Temp:   98.1 F (36.7 C) 98.2 F (36.8 C)  TempSrc:   Oral Oral  SpO2:  93% 100% 100%  Weight:        Intake/Output Summary (Last 24 hours) at 07/05/2018 1743 Last data filed at 07/05/2018 1708 Gross per 24 hour  Intake -  Output 900 ml  Net -900 ml   Filed Weights   07/05/18 0317  Weight: 60 kg    Examination:  .General: 68 y.o. female resting in bed in NAD Cardiovascular: tachy, +S1, S2, no g/r,  2/6 SEM equal pulses throughout Respiratory: tachypnea, purse-lipped breathing w/ crackles b/l GI: BS+, NDNT, no masses noted, no organomegaly noted MSK: No e/c/c Skin: No rashes, bruises, ulcerations noted Neuro: A&O x 3, no focal deficits Psyc: anxious     Data Reviewed: I have personally reviewed following labs and imaging studies.  CBC: Recent Labs  Lab 07/04/18 1316 07/04/18 1416 07/05/18 0435  WBC 18.0*  --  9.8  NEUTROABS 13.6*  --   --   HGB 9.7* 10.5* 9.4*  HCT 34.1* 31.0* 31.2*  MCV 97.7  --  91.5  PLT 341  --  885   Basic Metabolic Panel: Recent Labs  Lab 07/04/18 1316 07/04/18 1416 07/05/18 0435  NA 144 142 145  K 3.8 3.2* 4.4  CL 100  --  90*  CO2 32  --   42*  GLUCOSE 279*  --  128*  BUN 20  --  26*  CREATININE 0.85  --  0.94  CALCIUM 8.8*  --  9.6  MG  --   --  2.1   GFR: CrCl cannot be calculated (Unknown ideal weight.). Liver Function Tests: Recent Labs  Lab 07/04/18 1316  AST 41  ALT 34  ALKPHOS 62  BILITOT 0.5  PROT 6.6  ALBUMIN 3.3*   No results for input(s): LIPASE, AMYLASE in the last 168 hours. No results for input(s): AMMONIA in the last 168 hours. Coagulation Profile: No results for input(s): INR, PROTIME in the last 168 hours. Cardiac Enzymes: Recent Labs  Lab 07/04/18 1316 07/04/18 1729 07/04/18 2250 07/05/18 0435  TROPONINI 0.12* 0.68* 0.75* 0.67*   BNP (last 3 results) No results for input(s): PROBNP in the last 8760 hours. HbA1C: No results for input(s): HGBA1C in the last 72 hours. CBG: Recent Labs  Lab 07/04/18 1309  GLUCAP 143*   Lipid Profile: No results for input(s): CHOL, HDL, LDLCALC, TRIG, CHOLHDL, LDLDIRECT in the last 72 hours. Thyroid Function Tests: No results for input(s): TSH, T4TOTAL, FREET4, T3FREE, THYROIDAB in the last 72 hours. Anemia Panel: No results for input(s): VITAMINB12, FOLATE, FERRITIN, TIBC, IRON, RETICCTPCT in the last 72 hours. Sepsis Labs: No results for input(s): PROCALCITON, LATICACIDVEN in the last 168 hours.  Recent Results (from the past 240 hour(s))  SARS Coronavirus 2 (CEPHEID- Performed in [place] hospital lab), Hosp Order     Status: None   Collection Time: 07/04/18  2:46 PM  Result Value Ref Range Status   SARS Coronavirus 2 NEGATIVE NEGATIVE Final    Comment: (NOTE) If result is NEGATIVE SARS-CoV-2 target nucleic acids are NOT DETECTED. The SARS-CoV-2 RNA is generally detectable in upper and lower  respiratory specimens during the acute phase of infection. The lowest  concentration of SARS-CoV-2 viral copies this assay can detect is 250  copies / mL. A negative result does not preclude SARS-CoV-2 infection  and should not be used as the sole  basis for treatment or other  patient management decisions.  A negative result may occur with  improper specimen collection / handling, submission of specimen other  than nasopharyngeal swab, presence of viral mutation(s) within the  areas targeted by this assay, and inadequate number of viral copies  (<250 copies / mL). A negative result must be combined with clinical  observations, patient history, and epidemiological information. If result is POSITIVE SARS-CoV-2 target nucleic acids are DETECTED. The SARS-CoV-2 RNA is generally detectable in upper and lower  respiratory specimens dur ing the acute phase of infection.  Positive  results are indicative of active infection with SARS-CoV-2.  Clinical  correlation with patient history and other diagnostic information is  necessary to determine patient infection status.  Positive results do  not rule out bacterial infection or co-infection with other viruses. If result is PRESUMPTIVE POSTIVE SARS-CoV-2 nucleic acids MAY BE PRESENT.   A presumptive positive result was obtained on the submitted specimen  and confirmed on repeat testing.  While 2019 novel coronavirus  (SARS-CoV-2) nucleic acids may be present in the submitted sample  additional confirmatory testing may be necessary for epidemiological  and / or clinical management purposes  to differentiate between  SARS-CoV-2 and other Sarbecovirus currently known to infect humans.  If clinically indicated additional testing with an alternate test  methodology [phone]) is advised. The SARS-CoV-2 RNA is generally  detectable in upper and lower respiratory sp ecimens during the acute  phase of infection. The expected result is Negative. Fact Sheet for Patients:  StrictlyIdeas.no Fact Sheet for Healthcare Providers: BankingDealers.co.za This test is not yet approved or cleared by the [place] FDA and has been authorized for detection  and/or diagnosis of SARS-CoV-2 by FDA under an Emergency Use Authorization (EUA).  This EUA will remain in effect (meaning this test can be used) for the duration of the COVID-19 declaration under Section 564(b)(1) of the Act, 21 U.S.C. section 360bbb-3(b)(1), unless the authorization is terminated or revoked sooner. Performed at [facility], [place] [address] [place], [place] [address]          Radiology Studies: Dg Chest Portable 1 View  Result Date: 07/04/2018 CLINICAL DATA:  Status post CPR EXAM: PORTABLE CHEST 1 VIEW COMPARISON:  04/08/2016 FINDINGS: Postsurgical changes are noted in the left chest wall. Cardiac shadow is stable. Chronic volume loss and consolidation on the left is noted. Postsurgical changes are seen. Mild interstitial changes in the right lung are noted with a vague nodular density in the right lateral lung base. This is new from the prior exam. IMPRESSION: Postsurgical changes on the left. New nodular density in the right lung base. Electronically Signed   By: Inez Catalina M.D.   On: 07/04/2018 13:27        Scheduled Meds: . acidophilus   Oral Daily  . atorvastatin  10 mg Oral Daily  . DULoxetine  60 mg Oral QPM  . enoxaparin (LOVENOX) injection  40 mg Subcutaneous Q24H  . [START ON 07/06/2018] furosemide  40 mg Oral Once per day on Mon Wed Fri  . gabapentin  400 mg Oral QID  . guaiFENesin  600 mg Oral BID  . lidocaine  1 patch Transdermal Q24H  . mometasone-formoterol  2 puff Inhalation BID  . pantoprazole  40 mg Oral Daily  . potassium chloride SA  20 mEq Oral Once per day on Mon Wed Fri  . predniSONE  10 mg Oral Q breakfast  . sodium chloride HYPERTONIC  3 mL Nebulization Daily  . tadalafil (PAH)  20 mg Oral QHS  . Treprostinil  18 mcg Inhalation QID   Continuous Infusions:   LOS: 0 days    Time spent: 40 minutes spent in the coordination of care today.     Jonnie Finner, DO [facility] Pager [phone]  If 7PM-7AM,  please contact night-coverage [email or web address] Password [facility] 07/05/2018, 5:43 PM

## 2018-07-05 NOTE — Progress Notes (Signed)
Daily Progress Note   Patient Name: Deborah [name]       Date: 07/05/2018 DOB: 1950/06/17  Age: 68 y.o. MRN#: [id number] Attending Physician: Jonnie Finner, DO Primary Care Physician: Crist Infante, MD Admit Date: 07/04/2018  Reason for Consultation/Follow-up: Establishing goals of care, Non pain symptom management and Psychosocial/spiritual support  Subjective: Patient expresses she is afraid of dying.  "I wish I could get well enough not to worry about dying everyday so that I can just enjoy the day."  Many nights she is afraid to go to sleep for fear of dying. Sometimes she is afraid when her husband goes outside to work in the yard.  We discussed her anxiety.   We discussed Palliative Care to follow outpatient.  Will trial low dose ativan q 12 PRN anxiety, insomnia.   Assessment: Patient with end stage bronchiectasis, pulmonary HTN.  SOB at rest.  Cardiac arrest yesterday.    Length of Stay: 0  Current Medications: Scheduled Meds:  . acidophilus   Oral Daily  . atorvastatin  10 mg Oral Daily  . DULoxetine  60 mg Oral QPM  . enoxaparin (LOVENOX) injection  40 mg Subcutaneous Q24H  . [START ON 07/06/2018] furosemide  40 mg Oral Once per day on Mon Wed Fri  . gabapentin  400 mg Oral QID  . guaiFENesin  600 mg Oral BID  . lidocaine  1 patch Transdermal Q24H  . mometasone-formoterol  2 puff Inhalation BID  . pantoprazole  40 mg Oral Daily  . potassium chloride SA  20 mEq Oral Once per day on Mon Wed Fri  . predniSONE  10 mg Oral Q breakfast  . sodium chloride HYPERTONIC  3 mL Nebulization Daily  . tadalafil (PAH)  20 mg Oral QHS  . Treprostinil  18 mcg Inhalation QID    Continuous Infusions:   PRN Meds: acetaminophen **OR** acetaminophen, albuterol, LORazepam, morphine,  ondansetron **OR** ondansetron (ZOFRAN) IV, oxyCODONE, polyvinyl alcohol, sodium chloride  Physical Exam       2 of 6   Vital Signs: BP (!) 174/82 (BP Location: Left Arm)   Pulse (!) 111   Temp 98.1 F (36.7 C) (Oral)   Resp (!) 26   Wt 60 kg   SpO2 100%   BMI 22.01 kg/m  SpO2: SpO2: 100 % O2  Device: O2 Device: High Flow Nasal Cannula O2 Flow Rate: O2 Flow Rate (L/min): 10 L/min  Intake/output summary: No intake or output data in the 24 hours ending 07/05/18 1546 LBM:   Baseline Weight: Weight: 60 kg Most recent weight: Weight: 60 kg       Palliative Assessment/Data: 40%    Flowsheet Rows     Most Recent Value  Intake Tab  Unit at Time of Referral  ER  Date Notified  07/04/18  Palliative Care Type  New Palliative care  Reason for referral  End of Life Care Assistance  Date of Admission  07/04/18  # of days IP prior to Palliative referral  0  Clinical Assessment  Psychosocial & Spiritual Assessment  Palliative Care Outcomes      Patient Active Problem List   Diagnosis Date Noted  . Acute on chronic respiratory failure with hypoxia ([place]) 07/04/2018  . CREST (calcinosis, Raynaud's phenomenon, esophageal dysfunction, sclerodactyly, telangiectasia) ([place]) 07/04/2018  . Cardiac arrest ([place])   . Palliative care encounter   . Pulmonary hypertension ([place]) 05/19/2016  . ILD (interstitial lung disease) ([place]) 05/19/2016  . Lung collapse   . Acute respiratory distress   . 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 S/P partial lobectomy of lung 04/05/2016  . Chronic pain 04/05/2016  . Hyperglycemia 04/05/2016  . History of colonic polyps 11/19/2014  . Chronic respiratory failure ([place]) 08/07/2013  . Bronchiectasis without acute exacerbation ([place]) 12/01/2011  . Anemia 03/10/2011  . Restrictive lung disease 12/02/2010  . REACTIVE AIRWAY DISEASE 12/05/2009  . HYPERTENSION 06/18/2008  . BACK PAIN, CHRONIC 02/07/2008  . HLD (hyperlipidemia) 11/08/2006  . RAYNAUD'S DISEASE  11/08/2006  . OSTEOPENIA 11/08/2006    Palliative Care Plan    Recommendations/Plan:  Patient is Hospice Eligible but would like Palliative Services at home on discharge.  She indicates [facility].  Will add low dose ativan BID PRN insomnia or anxiety.  Continue low dose morphine for dyspnea  Continue chronic pain meds.  Chaplain consulted regarding fear of dying.  Goals of Care and Additional Recommendations:  Limitations on Scope of Treatment: Full Scope Treatment  Code Status:  DNR  Prognosis:   < 6 months disabling dypnea   Discharge Planning:  Home with Palliative Services  Care plan was discussed with patient, RN and [facility].  Thank you for allowing the Palliative Medicine Team to assist in the care of this patient.  Total time spent:   35 min.     Greater than 50%  of this time was spent counseling and coordinating care related to the above assessment and plan.  Florentina Jenny, PA-C Palliative Medicine  Please contact Palliative MedicineTeam phone at [phone] for questions and concerns between 7 am - 7 pm.   Please see AMION for individual provider pager numbers.

## 2018-07-06 MED ORDER — LORAZEPAM 0.5 MG PO TABS: 0.2500 mg | ORAL_TABLET | Freq: Two times a day (BID) | ORAL | 0 refills | Status: AC

## 2018-07-06 MED ORDER — GUAIFENESIN ER 600 MG PO TB12: 600.0000 mg | ORAL_TABLET | Freq: Two times a day (BID) | ORAL | 0 refills | Status: AC

## 2018-07-06 MED FILL — MUCUS RELIEF 600 MG TB12: 600 | 5 days supply | Qty: 10 | Fill #0

## 2018-07-06 MED FILL — LORazepam 0.5 MG TABS: 0.5 | 3 days supply | Qty: 5 | Fill #0

## 2018-07-06 NOTE — Progress Notes (Signed)
Patient has not voided this shift. She states she does not feel the urge to void, however the bladder scanner shows [id number]. She also states that she would like more time to try to void. MD paged and notified of the patient not voiding this shift.

## 2018-07-06 NOTE — Discharge Summary (Signed)
. Physician Discharge Summary  Deborah [name]:[id number] DOB: Jan 31, 1951 DOA: 07/04/2018  PCP: Deborah Infante, MD  Admit date: 07/04/2018 Discharge date: 07/06/2018  Admitted From: Home Disposition:  Discharged to home w/ Palliative follow up.  Recommendations for Outpatient Follow-up:  1. Follow up with PCP in 1-2 weeks 2. Please obtain BMP/CBC in one week   Discharge Condition: Stable  CODE STATUS: DNR   Brief/Interim Summary: Deborah [name] a 68 y.o.femalewith medical history significant ofrestrictive lung disease/bronchiectasis s/p partial lobectomy, chronic respiratory failurewith hypoxiaon 8 L nasal cannula oxygen, CREST/scleroderma,HTN, HLD, and CAD;who presents with complaints of shortness breath. Apparently patient chronically has shortness of breath, but had reportedly pushed herself a little harder than normal and became acutely short of breath. The patient's husband reported trying to increase nasal cannula oxygen to 10 L and placed on a nonrebreather without improvement in O2 saturations which were noted to drop down into the 70s. He reported that she turned blue in color. Upon EMS arrival, no pulse was appreciated and CPR was begun. Patient husband found her to not resuscitate form and CPR was stopped at that time and patient was noted to have pulses. She was brought into the ER on a nonrebreather mask with 15 L of nasal cannula oxygen. She does complain of some chest discomfort, nasal congestion, and periodic limb movements. Patient no longer on Cellcept due to recurrent infections.  Acute on chronic respiratory failure w/ hypoxia/hypercapniea     - Hx of bronchiectasis, pHTN, end stage lung disease     - COVID negative     - continue dulera, 3% saline, albuterol; PRN albuterol     - currently DNR     - She is back to baseline respiratory status. She is able to speak comfortably this morning and is no longer breathless with convo     - she is  interested in outpt palliative care follow up and that will be arranged by the hospice team     - she is ok for discharge to home  Scleroderma/CREST/pHTN     - tyvaso, tadalafil     - see above  Chronic pain/peripheral neuropathy     - PRN oxy, continue gabapentin     - worsened by CPR, monitor  Right lung nodule as seen on CXR     - hx of carcinoid tumor of left lung     - follow up outpt  Discharge Diagnoses:  Principal Problem:   Acute on chronic respiratory failure with hypoxia ([place]) Active Problems:   Bronchiectasis without acute exacerbation ([place])   Carcinoid tumor of left lung   Chronic pain   ILD (interstitial lung disease) ([place])   Cardiac arrest ([place])   CREST (calcinosis, Raynaud's phenomenon, esophageal dysfunction, sclerodactyly, telangiectasia) ([place])   Anxiety state    Discharge Instructions   Allergies as of 07/06/2018      Reactions   Megace Es [megestrol Acetate]    Severe leg cramps   Lodine [etodolac] Rash   REACTION: rash      Medication List    STOP taking these medications   hyoscyamine 0.125 MG SL tablet Commonly known as:  LEVSIN SL     TAKE these medications   ACIDOPHILUS SUPER PROBIOTIC PO Take 1 capsule by mouth daily.   Adcirca 20 MG tablet Generic drug:  tadalafil (PAH) Take 20 mg by mouth at bedtime.   albuterol 108 (90 Base) MCG/ACT inhaler Commonly known as:  VENTOLIN HFA Inhale 2 puffs into  the lungs every 6 (six) hours as needed. For shortness of breath. What changed:    reasons to take this  additional instructions   atorvastatin 10 MG tablet Commonly known as:  LIPITOR Take 10 mg by mouth daily.   cholecalciferol 1000 units tablet Commonly known as:  VITAMIN D Take 2,000 Units by mouth every Monday, Wednesday, and Friday.   denosumab 60 MG/ML Soln injection Commonly known as:  PROLIA Inject 60 mg into the skin every 6 (six) months. Administer in upper arm, thigh, or abdomen   DULoxetine 60 MG  capsule Commonly known as:  CYMBALTA Take 60 mg by mouth every evening.   furosemide 40 MG tablet Commonly known as:  LASIX Take 40 mg by mouth See admin instructions. Take 4 times weekly only on Sun Tues Thurs and Sat.   gabapentin 400 MG capsule Commonly known as:  NEURONTIN Take 400 mg by mouth 4 (four) times daily.   guaiFENesin 600 MG 12 hr tablet Commonly known as:  MUCINEX Take 1 tablet (600 mg total) by mouth 2 (two) times daily for 5 days.   lidocaine 5 % Commonly known as:  LIDODERM Place 1 patch onto the skin daily. Remove & Discard patch within 12 hours or as directed by MD   LORazepam 0.5 MG tablet Commonly known as:  ATIVAN Take 0.5 tablets (0.25 mg total) by mouth 2 (two) times daily for 5 days.   mometasone-formoterol 100-5 MCG/ACT Aero Commonly known as:  DULERA Inhale 2 puffs into the lungs 2 (two) times daily.   multivitamin with minerals Tabs tablet Take 1 tablet by mouth daily.   oxycodone 5 MG capsule Commonly known as:  OXY-IR Take 5 mg by mouth 4 (four) times daily.   OXYGEN Inhale into the lungs. Continuous, 4 L/min when exercising and 6 L/min when sitting   pantoprazole 40 MG tablet Commonly known as:  PROTONIX Take 40 mg by mouth daily.   Polyethyl Glycol-Propyl Glycol 0.4-0.3 % Soln Apply 1 drop to eye 2 (two) times a day.   potassium chloride SA 20 MEQ tablet Commonly known as:  K-DUR Take 20 mEq by mouth See admin instructions. Take with lasix; Take 4 times weekly only on Sun Tues Thurs and Sat.   predniSONE 10 MG tablet Commonly known as:  DELTASONE Take 10 mg by mouth daily with breakfast.   sodium chloride HYPERTONIC 3 % nebulizer solution Take 3 mLs by nebulization daily.   tobramycin (PF) 300 MG/5ML nebulizer solution Commonly known as:  TOBI Take 300 mg by nebulization 2 (two) times daily.   Treprostinil 0.6 MG/ML Soln Commonly known as:  TYVASO Inhale 18 mcg into the lungs 4 (four) times daily. 1.79m/2.9mL    (12breaths)   vitamin B-12 1000 MCG tablet Commonly known as:  CYANOCOBALAMIN Take 1,000 mcg by mouth 2 (two) times a week. Mon and Fri.      Follow-up Information    [facility] Follow up.   Specialty:  Hospice and Palliative Medicine Why:  Palliative services Contact information: [phone]         Allergies  Allergen Reactions  . Megace Es [Megestrol Acetate]     Severe leg cramps   . Lodine [Etodolac] Rash    REACTION: rash    Consultations:  Hospice   Procedures/Studies: Dg Chest Portable 1 View  Result Date: 07/04/2018 CLINICAL DATA:  Status post CPR EXAM: PORTABLE CHEST 1 VIEW COMPARISON:  04/08/2016 FINDINGS: Postsurgical changes  are noted in the left chest wall. Cardiac shadow is stable. Chronic volume loss and consolidation on the left is noted. Postsurgical changes are seen. Mild interstitial changes in the right lung are noted with a vague nodular density in the right lateral lung base. This is new from the prior exam. IMPRESSION: Postsurgical changes on the left. New nodular density in the right lung base. Electronically Signed   By: Inez Catalina M.D.   On: 07/04/2018 13:27      Subjective: "I feel much better. I feel like I can breathe again."  Discharge Exam: Vitals:   07/06/18 0854 07/06/18 1137  BP:  (!) 162/83  Pulse: (!) 109 (!) 111  Resp: (!) 23 (!) 25  Temp:  (!) 97.4 F (36.3 C)  SpO2: 97% 98%   Vitals:   07/06/18 0325 07/06/18 0815 07/06/18 0854 07/06/18 1137  BP: (!) 159/93 (!) 172/89  (!) 162/83  Pulse: (!) 102 (!) 102 (!) 109 (!) 111  Resp: (!) 22 (!) 23 (!) 23 (!) 25  Temp: 97.7 F (36.5 C) 97.7 F (36.5 C)  (!) 97.4 F (36.3 C)  TempSrc: Oral Oral  Oral  SpO2: 100% 99% 97% 98%  Weight:        General: 69 y.o. female resting in bed in NAD Cardiovascular: tachy, +S1, S2, no g/r, 2/6 SEM equal pulses throughout Respiratory: crackles b/l, but much calmer  breathing GI: BS+, NDNT, no masses noted, no organomegaly noted MSK: No e/c/c Skin: No rashes, bruises, ulcerations noted Neuro: A&O x 3, no focal deficits Psyc: calmer today    The results of significant diagnostics from this hospitalization (including imaging, microbiology, ancillary and laboratory) are listed below for reference.     Microbiology: Recent Results (from the past 240 hour(s))  SARS Coronavirus 2 (CEPHEID- Performed in [facility] hospital lab), Hosp Order     Status: None   Collection Time: 07/04/18  2:46 PM  Result Value Ref Range Status   SARS Coronavirus 2 NEGATIVE NEGATIVE Final    Comment: (NOTE) If result is NEGATIVE SARS-CoV-2 target nucleic acids are NOT DETECTED. The SARS-CoV-2 RNA is generally detectable in upper and lower  respiratory specimens during the acute phase of infection. The lowest  concentration of SARS-CoV-2 viral copies this assay can detect is 250  copies / mL. A negative result does not preclude SARS-CoV-2 infection  and should not be used as the sole basis for treatment or other  patient management decisions.  A negative result may occur with  improper specimen collection / handling, submission of specimen other  than nasopharyngeal swab, presence of viral mutation(s) within the  areas targeted by this assay, and inadequate number of viral copies  (<250 copies / mL). A negative result must be combined with clinical  observations, patient history, and epidemiological information. If result is POSITIVE SARS-CoV-2 target nucleic acids are DETECTED. The SARS-CoV-2 RNA is generally detectable in upper and lower  respiratory specimens dur ing the acute phase of infection.  Positive  results are indicative of active infection with SARS-CoV-2.  Clinical  correlation with patient history and other diagnostic information is  necessary to determine patient infection status.  Positive results do  not rule out bacterial infection or co-infection  with other viruses. If result is PRESUMPTIVE POSTIVE SARS-CoV-2 nucleic acids MAY BE PRESENT.   A presumptive positive result was obtained on the submitted specimen  and confirmed on repeat testing.  While 2019 novel coronavirus  (SARS-CoV-2) nucleic acids may be  present in the submitted sample  additional confirmatory testing may be necessary for epidemiological  and / or clinical management purposes  to differentiate between  SARS-CoV-2 and other Sarbecovirus currently known to infect humans.  If clinically indicated additional testing with an alternate test  methodology [phone]) is advised. The SARS-CoV-2 RNA is generally  detectable in upper and lower respiratory sp ecimens during the acute  phase of infection. The expected result is Negative. Fact Sheet for Patients:  StrictlyIdeas.no Fact Sheet for Healthcare Providers: BankingDealers.co.za This test is not yet approved or cleared by the [place] FDA and has been authorized for detection and/or diagnosis of SARS-CoV-2 by FDA under an Emergency Use Authorization (EUA).  This EUA will remain in effect (meaning this test can be used) for the duration of the COVID-19 declaration under Section 564(b)(1) of the Act, 21 U.S.C. section 360bbb-3(b)(1), unless the authorization is terminated or revoked sooner. Performed at [facility], [place] [address], [place], [place] [address]      Labs: BNP (last 3 results) Recent Labs    07/04/18 1318  BNP 937.1*   Basic Metabolic Panel: Recent Labs  Lab 07/04/18 1316 07/04/18 1416 07/05/18 0435  NA 144 142 145  K 3.8 3.2* 4.4  CL 100  --  90*  CO2 32  --  42*  GLUCOSE 279*  --  128*  BUN 20  --  26*  CREATININE 0.85  --  0.94  CALCIUM 8.8*  --  9.6  MG  --   --  2.1   Liver Function Tests: Recent Labs  Lab 07/04/18 1316  AST 41  ALT 34  ALKPHOS 62  BILITOT 0.5  PROT 6.6  ALBUMIN 3.3*   No results for input(s):  LIPASE, AMYLASE in the last 168 hours. No results for input(s): AMMONIA in the last 168 hours. CBC: Recent Labs  Lab 07/04/18 1316 07/04/18 1416 07/05/18 0435  WBC 18.0*  --  9.8  NEUTROABS 13.6*  --   --   HGB 9.7* 10.5* 9.4*  HCT 34.1* 31.0* 31.2*  MCV 97.7  --  91.5  PLT 341  --  222   Cardiac Enzymes: Recent Labs  Lab 07/04/18 1316 07/04/18 1729 07/04/18 2250 07/05/18 0435  TROPONINI 0.12* 0.68* 0.75* 0.67*   BNP: Invalid input(s): POCBNP CBG: Recent Labs  Lab 07/04/18 1309  GLUCAP 143*   D-Dimer No results for input(s): DDIMER in the last 72 hours. Hgb A1c No results for input(s): HGBA1C in the last 72 hours. Lipid Profile No results for input(s): CHOL, HDL, LDLCALC, TRIG, CHOLHDL, LDLDIRECT in the last 72 hours. Thyroid function studies No results for input(s): TSH, T4TOTAL, T3FREE, THYROIDAB in the last 72 hours.  Invalid input(s): FREET3 Anemia work up No results for input(s): VITAMINB12, FOLATE, FERRITIN, TIBC, IRON, RETICCTPCT in the last 72 hours. Urinalysis    Component Value Date/Time   COLORURINE YELLOW 06/03/2012 2131   APPEARANCEUR CLEAR 06/03/2012 2131   LABSPEC 1.010 06/03/2012 2131   PHURINE 6.5 06/03/2012 2131   GLUCOSEU NEGATIVE 06/03/2012 2131   HGBUR NEGATIVE 06/03/2012 2131   HGBUR negative 02/25/2010 0917   BILIRUBINUR NEGATIVE 06/03/2012 2131   BILIRUBINUR n 03/01/2011 [place] NEGATIVE 06/03/2012 2131   PROTEINUR NEGATIVE 06/03/2012 2131   UROBILINOGEN 0.2 06/03/2012 2131   NITRITE NEGATIVE 06/03/2012 2131   LEUKOCYTESUR NEGATIVE 06/03/2012 2131   Sepsis Labs Invalid input(s): PROCALCITONIN,  WBC,  LACTICIDVEN Microbiology Recent Results (from the past 240 hour(s))  SARS  Coronavirus 2 (CEPHEID- Performed in [facility] hospital lab), Hosp Order     Status: None   Collection Time: 07/04/18  2:46 PM  Result Value Ref Range Status   SARS Coronavirus 2 NEGATIVE NEGATIVE Final    Comment: (NOTE) If result is  NEGATIVE SARS-CoV-2 target nucleic acids are NOT DETECTED. The SARS-CoV-2 RNA is generally detectable in upper and lower  respiratory specimens during the acute phase of infection. The lowest  concentration of SARS-CoV-2 viral copies this assay can detect is 250  copies / mL. A negative result does not preclude SARS-CoV-2 infection  and should not be used as the sole basis for treatment or other  patient management decisions.  A negative result may occur with  improper specimen collection / handling, submission of specimen other  than nasopharyngeal swab, presence of viral mutation(s) within the  areas targeted by this assay, and inadequate number of viral copies  (<250 copies / mL). A negative result must be combined with clinical  observations, patient history, and epidemiological information. If result is POSITIVE SARS-CoV-2 target nucleic acids are DETECTED. The SARS-CoV-2 RNA is generally detectable in upper and lower  respiratory specimens dur ing the acute phase of infection.  Positive  results are indicative of active infection with SARS-CoV-2.  Clinical  correlation with patient history and other diagnostic information is  necessary to determine patient infection status.  Positive results do  not rule out bacterial infection or co-infection with other viruses. If result is PRESUMPTIVE POSTIVE SARS-CoV-2 nucleic acids MAY BE PRESENT.   A presumptive positive result was obtained on the submitted specimen  and confirmed on repeat testing.  While 2019 novel coronavirus  (SARS-CoV-2) nucleic acids may be present in the submitted sample  additional confirmatory testing may be necessary for epidemiological  and / or clinical management purposes  to differentiate between  SARS-CoV-2 and other Sarbecovirus currently known to infect humans.  If clinically indicated additional testing with an alternate test  methodology [phone]) is advised. The SARS-CoV-2 RNA is generally  detectable  in upper and lower respiratory sp ecimens during the acute  phase of infection. The expected result is Negative. Fact Sheet for Patients:  StrictlyIdeas.no Fact Sheet for Healthcare Providers: BankingDealers.co.za This test is not yet approved or cleared by the [place] FDA and has been authorized for detection and/or diagnosis of SARS-CoV-2 by FDA under an Emergency Use Authorization (EUA).  This EUA will remain in effect (meaning this test can be used) for the duration of the COVID-19 declaration under Section 564(b)(1) of the Act, 21 U.S.C. section 360bbb-3(b)(1), unless the authorization is terminated or revoked sooner. Performed at [facility], [place] [address], [place], [place] [address]      Time coordinating discharge: 45 minutes spent in the coordination of care today.  SIGNED:   Jonnie Finner, DO  [facility] 07/06/2018, 12:58 PM Pager   If 7PM-7AM, please contact night-coverage [email or web address] Password [email or web address]

## 2018-07-06 NOTE — Progress Notes (Signed)
Responded to spiritual care consult to support patient.  Patient not available at time of visit. Patient possibly being discharged today. Chaplain will follow as needed.  Jaclynn Major, [place], [facility], Pager [phone]

## 2018-07-06 NOTE — TOC Transition Note (Signed)
Transition of Care [facility]) - CM/SW Discharge Note   Patient Details  Name: ARTHURINE OLEARY MRN: [id number] Date of Birth: [birth year]  [facility]) CM/SW Contact:  Zenon Mayo, RN Phone Number: 07/06/2018, 11:05 AM   Clinical Narrative:    From home with spouse, she will dc today home with Palliative services with [facility], NCM left message for [facility] that patient will be dc today per her spouse.  She has oxygen with [employer] and he has the oxygen in the car.  NCM offered [facility] services also for patient, patient and spouse states they do not want [place] services he will be with her 24 hrs , and also her neighbor will be with her when the spouse needs her.     Final next level of care: Home/Self Care Barriers to Discharge: No Barriers Identified   Patient Goals and CMS Choice Patient states their goals for this hospitalization and ongoing recovery are:: take it easy   Choice offered to / list presented to : NA  Discharge Placement                       Discharge Plan and Services In-house Referral: Hospice / Palliative Care Discharge Planning Services: CM Consult Post Acute Care Choice: (Palliative services)          DME Arranged: N/A DME Agency: NA       HH Arranged: NA HH Agency: NA        Social Determinants of Health (SDOH) Interventions     Readmission Risk Interventions No flowsheet data found.

## 2018-07-11 ENCOUNTER — Telehealth: Payer: Self-pay

## 2018-07-11 NOTE — Telephone Encounter (Signed)
Phone call placed to patient's husband to introduce Palliative care and to offer to schedule visit. VM left with call back information

## 2018-07-14 ENCOUNTER — Telehealth: Payer: Self-pay

## 2018-07-14 ENCOUNTER — Encounter (HOSPITAL_COMMUNITY): Payer: Medicare Other

## 2018-07-14 ENCOUNTER — Ambulatory Visit (HOSPITAL_COMMUNITY): Payer: Medicare Other

## 2018-07-14 NOTE — Telephone Encounter (Signed)
Opened in error

## 2018-07-14 NOTE — Telephone Encounter (Signed)
VM left to offer to schedule visit with Palliative Care

## 2018-07-14 NOTE — Telephone Encounter (Signed)
Received return call from patient's husband, Shanon Brow. Due to the current COVID-19 infection/crises, the patient and family prefer, and have given their verbal consent for, a provider visit via telemedicine. HIPPA policies of confidentially were discussed and patient/family expressed understanding.  Visit scheduled for Friday 07/21/2018

## 2018-07-21 ENCOUNTER — Other Ambulatory Visit: Payer: Self-pay

## 2018-07-21 ENCOUNTER — Other Ambulatory Visit: Payer: Medicare Other | Admitting: Adult Health Nurse Practitioner

## 2018-07-21 DIAGNOSIS — Z515 Encounter for palliative care: Secondary | ICD-10-CM

## 2018-07-22 NOTE — Progress Notes (Signed)
[facility] Consult Note Telephone: [phone]  Fax: [phone]  PATIENT NAME: Deborah [name] DOB: [birth year] MRN: [id number]  PRIMARY CARE PROVIDER:   Crist Infante, MD  REFERRING PROVIDER:  Crist Infante, MD [place], [place] [address]  RESPONSIBLE PARTY:   Sefl and husband, Colette Dicamillo [phone]  Due to the COVID-19 crisis, this visit was done via telemedicine and it was initiated and consent by this patient and or family. Video-audio (telehealth) contact was unable to be done due to technical barriers from the patient's side.    RECOMMENDATIONS and PLAN:  1.  End stage restrictive lung disease/CREST.  Patient has had this diagnosis since 2015.  Patient is currently not taking in any active treatment.  She does take Tyvaso inhaled QID and tadalifil 20 mg daily for pulmonary HTN.  She also is on inhaled tobramycin monthly for pseudomonas diagnosis.  Husband states that this is supposed to be ongoing without an end date.  Patient is on O2 continuous via [place].  Husband states that she was on 8L continuous but this has been reduced upon recommendation by cardiologist.  States that he will bring it up to 8L from 6L when she is walking.  States that even talking on the phone her O2 sats go down.  Will increase her O2 and her sats go up.  Tries to keep her O2 levels above 90%.    2.  Functional status.  Patient is able to ambulate without assistance but husband walks with her.  Requires occasional assistance with ADLs.  Husband does state that she has been walking more and DOE is no worse than usual.  Is able to go to bathroom independently.  3.  Appetite.  Patient does state that her appetite has been declining over the past couple of months.  Does state that she has noticed that her appetite does decrease when she takes the tobramycin. Has lost a couple of pounds.  Baseline weight is around 127-128 and she is currently at 124.  Have encouraged her to take smaller frequent meals to see if this helps.   4. Pain.  Patient does have pain and is currently on oxycodone 5 mg QID PRN.  States that sometimes she doesn't need as much and then sometimes at night she will take a naproxen.  She gets good pain relief with current regimen and would not make any changes at this time.   5.  Goals of care.  Patient is a DNR but when EMS was called on 07/04/2018 they did preform CPR before husband showed them the DNR.  She did not have any complications from the CPR and they are wanting to change this to having limited rounds of compressions of around 2-3 rounds and if she doesn't come back stop.  Mailed patient MOST form and explanation sheets to go over and set up appointment in one week to go over.  Patient and husband are agreeable to hospice services when patient declines.  I spent 60 minutes providing this consultation,  from 11:30 to 12:30. More than 50% of the time in this consultation was spent coordinating communication.   HISTORY OF PRESENT ILLNESS:  Deborah [name] is a 68 y.o. year old female with multiple medical problems including end stage restrictive lung disease, CREST, CHF, CAD. Palliative Care was asked to help address goals of care.   CODE STATUS: DNR  PPS: 50% HOSPICE ELIGIBILITY/DIAGNOSIS: TBD  PAST MEDICAL HISTORY:  Past Medical  History:  Diagnosis Date  . Anemia   . Bronchiectasis ([facility])   . C. difficile colitis   . CAD (coronary artery disease)   . Cataract   . CHF (congestive heart failure) (HCC)    right sided heart failure from pulmonary hypertension  . COPD (chronic obstructive pulmonary disease) ([facility])   . CREST (calcinosis, Raynaud's phenomenon, esophageal dysfunction, sclerodactyly, telangiectasia) ([facility])   . Gallstones   . Hearing loss    right  . Hyperactive airway disease   . Hyperlipidemia   . Hypertension   . Lung cancer ([facility]) carcinoid tumor  2004  . Osteopenia   . Parainfluenza   .  Pneumonia   . Raynaud's disease   . Respiratory syncytial virus (RSV) infection   . Restrictive lung disease   . Shingles   . Tubulovillous adenoma of rectum 2007    SOCIAL HX:  Social History   Tobacco Use  . Smoking status: Never Smoker  . Smokeless tobacco: Never Used  Substance Use Topics  . Alcohol use: No    Alcohol/week: 0.0 standard drinks    ALLERGIES:  Allergies  Allergen Reactions  . Megace Es [Megestrol Acetate]     Severe leg cramps   . Lodine [Etodolac] Rash    REACTION: rash     PERTINENT MEDICATIONS:  Outpatient Encounter Medications as of 07/21/2018  Medication Sig  . albuterol (PROVENTIL HFA;VENTOLIN HFA) 108 (90 BASE) MCG/ACT inhaler Inhale 2 puffs into the lungs every 6 (six) hours as needed. For shortness of breath. (Patient taking differently: Inhale 2 puffs into the lungs every 6 (six) hours as needed for shortness of breath. )  . atorvastatin (LIPITOR) 10 MG tablet Take 10 mg by mouth daily.  . cholecalciferol (VITAMIN D) 1000 units tablet Take 2,000 Units by mouth every Monday, Wednesday, and Friday.   . denosumab (PROLIA) 60 MG/ML SOLN injection Inject 60 mg into the skin every 6 (six) months. Administer in upper arm, thigh, or abdomen  . DULoxetine (CYMBALTA) 60 MG capsule Take 60 mg by mouth every evening.   . gabapentin (NEURONTIN) 400 MG capsule Take 400 mg by mouth 4 (four) times daily.  Marland Kitchen lidocaine (LIDODERM) 5 % Place 1 patch onto the skin daily. Remove & Discard patch within 12 hours or as directed by MD  . mometasone-formoterol (DULERA) 100-5 MCG/ACT AERO Inhale 2 puffs into the lungs 2 (two) times daily.  . Multiple Vitamin (MULTIVITAMIN WITH MINERALS) TABS Take 1 tablet by mouth daily.  Marland Kitchen oxycodone (OXY-IR) 5 MG capsule Take 5 mg by mouth 4 (four) times daily.   . OXYGEN Inhale into the lungs. Continuous, 4 L/min when exercising and 6 L/min when sitting  . Polyethyl Glycol-Propyl Glycol 0.4-0.3 % SOLN Apply 1 drop to eye 2 (two) times a  day.   . predniSONE (DELTASONE) 10 MG tablet Take 10 mg by mouth daily with breakfast.  . Probiotic Product (ACIDOPHILUS SUPER PROBIOTIC PO) Take 1 capsule by mouth daily.   . sodium chloride HYPERTONIC 3 % nebulizer solution Take 3 mLs by nebulization daily.  . tadalafil, PAH, (ADCIRCA) 20 MG tablet Take 20 mg by mouth at bedtime.   Marland Kitchen tobramycin, PF, (TOBI) 300 MG/5ML nebulizer solution Take 300 mg by nebulization 2 (two) times daily.   . Treprostinil (TYVASO) 0.6 MG/ML SOLN Inhale 18 mcg into the lungs 4 (four) times daily. 1.44m/2.9mL   (12breaths)  . vitamin B-12 (CYANOCOBALAMIN) 1000 MCG tablet Take 1,000 mcg by mouth 2 (two) times a week. Mon  and Fri.   No facility-administered encounter medications on file as of 07/21/2018.       Amy Jenetta Downer, NP

## 2018-07-28 ENCOUNTER — Other Ambulatory Visit: Payer: Self-pay

## 2018-07-28 ENCOUNTER — Other Ambulatory Visit: Payer: Medicare Other | Admitting: Adult Health Nurse Practitioner

## 2018-07-28 DIAGNOSIS — Z515 Encounter for palliative care: Secondary | ICD-10-CM

## 2018-07-28 NOTE — Progress Notes (Signed)
[facility] Consult Note Telephone: [phone]  Fax: [phone]  PATIENT NAME: Deborah [name] DOB: 09/19/1950 MRN: [id number]  PRIMARY CARE PROVIDER:   Crist Infante, MD  REFERRING PROVIDER:  Crist Infante, MD [place], [place] [address]  RESPONSIBLE PARTY:   Self and husband, Analina Filla [phone]   Due to the COVID-19 crisis, this visit was done via telemedicine and it was initiated and consent by this patient and or family. Video-audio (telehealth) contact was unable to be done due to technical barriers from the patient's side.       RECOMMENDATIONS and PLAN:  1.  End stage restrictive lung disease/CREST.  Patient has had this diagnosis since 2015.  Patient is currently not taking in any active treatment.  She does take Tyvaso inhaled QID and tadalifil 20 mg daily for pulmonary HTN.  She also is on inhaled tobramycin monthly for pseudomonas diagnosis.  Husband does report that after a Tyvaso treatment she becomes SOB with O2 sats in the 80s. He has to turn up her oxygen until her sats come back up.Was concerned that keeping her O2 close to 100% all the time may be causing harm of increasing her CO2 levels.  He does express understanding that when her CO2 levels are up too high she has myoclonic jerking and increased confusion.  Just encouraged him and the patient that they know what to look for if she is getting too much CO2 and that increasing the O2 and bringing it back down when her O2 levels are back to normal range for her is not going to harm her as long as they are not keeping at too high of a level for long periods of time.  Really just reiterated what they are already doing and they voice that since they have been doing this that she is doing better than she was a month ago.  2.  Functional status.  Patient is able to ambulate without assistance but husband walks with her.  Requires occasional assistance with  ADLs.  Husband does state that she has been walking more and DOE is no worse than usual.  Is able to go to bathroom independently.  Denies any recent falls.  3.  Appetite.  Trying to work on smaller more frequent meals. Husband is offering her more snacks in between meals.  Weight stable.  4. Pain. Pain controlled with current pain regimen.  Continue current regimen  5.  Goals of care.  Patient had successful nontraumatic CPR and would like to forego DNR with added instructions of performing 2-3 rounds of chest compressions and if unsuccessful to STOP. Limited interventions; they do not want her on a ventilator.  Antibiotics as indicated, IV fluids and feeding tube for a trial period.  MOST form filled out and sent to patient and scanned and uploaded into [employer].  I spent 50 minutes providing this consultation,  from 11:20 to 12:10. More than 50% of the time in this consultation was spent coordinating communication.   HISTORY OF PRESENT ILLNESS:  GABRELLA [name] is a 68 y.o. year old female with multiple medical problems including end stage restrictive lung disease, CREST, CHF, CAD. Palliative Care was asked to help address goals of care.   CODE STATUS: Full Code but only wants 2-3 rounds of compressions and if unsuccessful STOP  PPS: 50% HOSPICE ELIGIBILITY/DIAGNOSIS: TBD  PAST MEDICAL HISTORY:  Past Medical History:  Diagnosis Date  . Anemia   . Bronchiectasis (  HCC)   . C. difficile colitis   . CAD (coronary artery disease)   . Cataract   . CHF (congestive heart failure) (HCC)    right sided heart failure from pulmonary hypertension  . COPD (chronic obstructive pulmonary disease) ([facility])   . CREST (calcinosis, Raynaud's phenomenon, esophageal dysfunction, sclerodactyly, telangiectasia) ([facility])   . Gallstones   . Hearing loss    right  . Hyperactive airway disease   . Hyperlipidemia   . Hypertension   . Lung cancer ([facility]) carcinoid tumor  2004  . Osteopenia   . Parainfluenza    . Pneumonia   . Raynaud's disease   . Respiratory syncytial virus (RSV) infection   . Restrictive lung disease   . Shingles   . Tubulovillous adenoma of rectum 2007    SOCIAL HX:  Social History   Tobacco Use  . Smoking status: Never Smoker  . Smokeless tobacco: Never Used  Substance Use Topics  . Alcohol use: No    Alcohol/week: 0.0 standard drinks    ALLERGIES:  Allergies  Allergen Reactions  . Megace Es [Megestrol Acetate]     Severe leg cramps   . Lodine [Etodolac] Rash    REACTION: rash     PERTINENT MEDICATIONS:  Outpatient Encounter Medications as of 07/28/2018  Medication Sig  . albuterol (PROVENTIL HFA;VENTOLIN HFA) 108 (90 BASE) MCG/ACT inhaler Inhale 2 puffs into the lungs every 6 (six) hours as needed. For shortness of breath. (Patient taking differently: Inhale 2 puffs into the lungs every 6 (six) hours as needed for shortness of breath. )  . atorvastatin (LIPITOR) 10 MG tablet Take 10 mg by mouth daily.  . cholecalciferol (VITAMIN D) 1000 units tablet Take 2,000 Units by mouth every Monday, Wednesday, and Friday.   . denosumab (PROLIA) 60 MG/ML SOLN injection Inject 60 mg into the skin every 6 (six) months. Administer in upper arm, thigh, or abdomen  . DULoxetine (CYMBALTA) 60 MG capsule Take 60 mg by mouth every evening.   . furosemide (LASIX) 40 MG tablet Take 40 mg by mouth See admin instructions. Take 4 times weekly only on Sun Tues Thurs and Sat.  . gabapentin (NEURONTIN) 400 MG capsule Take 400 mg by mouth 4 (four) times daily.  Marland Kitchen lidocaine (LIDODERM) 5 % Place 1 patch onto the skin daily. Remove & Discard patch within 12 hours or as directed by MD  . mometasone-formoterol (DULERA) 100-5 MCG/ACT AERO Inhale 2 puffs into the lungs 2 (two) times daily.  . Multiple Vitamin (MULTIVITAMIN WITH MINERALS) TABS Take 1 tablet by mouth daily.  Marland Kitchen oxycodone (OXY-IR) 5 MG capsule Take 5 mg by mouth 4 (four) times daily.   . OXYGEN Inhale into the lungs. Continuous, 4  L/min when exercising and 6 L/min when sitting  . pantoprazole (PROTONIX) 40 MG tablet Take 40 mg by mouth daily.   Vladimir Faster Glycol-Propyl Glycol 0.4-0.3 % SOLN Apply 1 drop to eye 2 (two) times a day.   . potassium chloride SA (K-DUR,KLOR-CON) 20 MEQ tablet Take 20 mEq by mouth See admin instructions. Take with lasix; Take 4 times weekly only on Sun Tues Thurs and Sat.  . predniSONE (DELTASONE) 10 MG tablet Take 10 mg by mouth daily with breakfast.  . Probiotic Product (ACIDOPHILUS SUPER PROBIOTIC PO) Take 1 capsule by mouth daily.   . sodium chloride HYPERTONIC 3 % nebulizer solution Take 3 mLs by nebulization daily.  . tadalafil, PAH, (ADCIRCA) 20 MG tablet Take 20 mg by mouth at bedtime.   Marland Kitchen  tobramycin, PF, (TOBI) 300 MG/5ML nebulizer solution Take 300 mg by nebulization 2 (two) times daily.   . Treprostinil (TYVASO) 0.6 MG/ML SOLN Inhale 18 mcg into the lungs 4 (four) times daily. 1.32m/2.9mL   (12breaths)  . vitamin B-12 (CYANOCOBALAMIN) 1000 MCG tablet Take 1,000 mcg by mouth 2 (two) times a week. Mon and Fri.   No facility-administered encounter medications on file as of 07/28/2018.      Amy KJenetta Downer NP

## 2018-08-07 ENCOUNTER — Other Ambulatory Visit: Payer: Self-pay

## 2018-08-07 ENCOUNTER — Other Ambulatory Visit: Payer: Medicare Other | Admitting: Adult Health Nurse Practitioner

## 2018-08-07 DIAGNOSIS — Z515 Encounter for palliative care: Secondary | ICD-10-CM

## 2018-08-07 NOTE — Progress Notes (Signed)
[facility] Consult Note Telephone: [phone]  Fax: [phone]  PATIENT NAME: Deborah [name] DOB: January 05, 1951 MRN: [id number]  PRIMARY CARE PROVIDER:   Crist Infante, MD  REFERRING PROVIDER:  Crist Infante, MD Deborah [name] [id number]  RESPONSIBLE PARTY:   Self and husband, Deborah [name] [phone]  Due to the COVID-19 crisis, this telephone evaluation and treatment contact was done via telephone and it was initiated and consent by this patient and or family.    RECOMMENDATIONS and PLAN:  This was a follow up with patient as she has had some worsening SOB.  Her O2 sats have been dropping into the 70s at times.  She had talked with her pulmonologist and had options of starting trilogy or a hospice referral.  They wanted a few days to think about it.  Attempted to answer some of their questions.  Right now they are leaning towards to talking with Deborah [name] about pursuing the trilogy.  Will follow up again next week to see if this is still what they want to do.  She is encouraged to call or email me with any questions or concerns.  I spent 15 minutes providing this consultation,  from 10:15 to 10:30. More than 50% of the time in this consultation was spent coordinating communication.   HISTORY OF PRESENT ILLNESS:  Deborah [name] is a 68 y.o. year old female with multiple medical problems including end stage restrictive lung disease, CREST, CHF, CAD. Palliative Care was asked to help address goals of care.   CODE STATUS: Full Code but only wants 2-3 rounds of compressions and if unsuccessful STOP  PPS: 50% HOSPICE ELIGIBILITY/DIAGNOSIS: TBD  PAST MEDICAL HISTORY:  Past Medical History:  Diagnosis Date  . Anemia   . Bronchiectasis ([facility])   . C. difficile colitis   . CAD (coronary artery disease)   . Cataract   . CHF (congestive heart failure) (HCC)    right sided heart failure from pulmonary hypertension   . COPD (chronic obstructive pulmonary disease) ([facility])   . CREST (calcinosis, Raynaud's phenomenon, esophageal dysfunction, sclerodactyly, telangiectasia) ([facility])   . Gallstones   . Hearing loss    right  . Hyperactive airway disease   . Hyperlipidemia   . Hypertension   . Lung cancer ([facility]) carcinoid tumor  2004  . Osteopenia   . Parainfluenza   . Pneumonia   . Raynaud's disease   . Respiratory syncytial virus (RSV) infection   . Restrictive lung disease   . Shingles   . Tubulovillous adenoma of rectum 2007    SOCIAL HX:  Social History   Tobacco Use  . Smoking status: Never Smoker  . Smokeless tobacco: Never Used  Substance Use Topics  . Alcohol use: No    Alcohol/week: 0.0 standard drinks    ALLERGIES:  Allergies  Allergen Reactions  . Megace Es [Megestrol Acetate]     Severe leg cramps   . Lodine [Etodolac] Rash    REACTION: rash     PERTINENT MEDICATIONS:  Outpatient Encounter Medications as of 08/07/2018  Medication Sig  . albuterol (PROVENTIL HFA;VENTOLIN HFA) 108 (90 BASE) MCG/ACT inhaler Inhale 2 puffs into the lungs every 6 (six) hours as needed. For shortness of breath. (Patient taking differently: Inhale 2 puffs into the lungs every 6 (six) hours as needed for shortness of breath. )  . atorvastatin (LIPITOR) 10 MG tablet Take 10 mg by mouth daily.  . cholecalciferol (VITAMIN D)  1000 units tablet Take 2,000 Units by mouth every Monday, Wednesday, and Friday.   . denosumab (PROLIA) 60 MG/ML SOLN injection Inject 60 mg into the skin every 6 (six) months. Administer in upper arm, thigh, or abdomen  . DULoxetine (CYMBALTA) 60 MG capsule Take 60 mg by mouth every evening.   . furosemide (LASIX) 40 MG tablet Take 40 mg by mouth See admin instructions. Take 4 times weekly only on Sun Tues Thurs and Sat.  . gabapentin (NEURONTIN) 400 MG capsule Take 400 mg by mouth 4 (four) times daily.  Marland Kitchen lidocaine (LIDODERM) 5 % Place 1 patch onto the skin daily. Remove & Discard patch  within 12 hours or as directed by MD  . mometasone-formoterol (DULERA) 100-5 MCG/ACT AERO Inhale 2 puffs into the lungs 2 (two) times daily.  . Multiple Vitamin (MULTIVITAMIN WITH MINERALS) TABS Take 1 tablet by mouth daily.  Marland Kitchen oxycodone (OXY-IR) 5 MG capsule Take 5 mg by mouth 4 (four) times daily.   . OXYGEN Inhale into the lungs. Continuous, 4 L/min when exercising and 6 L/min when sitting  . pantoprazole (PROTONIX) 40 MG tablet Take 40 mg by mouth daily.   Vladimir Faster Glycol-Propyl Glycol 0.4-0.3 % SOLN Apply 1 drop to eye 2 (two) times a day.   . potassium chloride SA (K-DUR,KLOR-CON) 20 MEQ tablet Take 20 mEq by mouth See admin instructions. Take with lasix; Take 4 times weekly only on Sun Tues Thurs and Sat.  . predniSONE (DELTASONE) 10 MG tablet Take 10 mg by mouth daily with breakfast.  . Probiotic Product (ACIDOPHILUS SUPER PROBIOTIC PO) Take 1 capsule by mouth daily.   . sodium chloride HYPERTONIC 3 % nebulizer solution Take 3 mLs by nebulization daily.  . tadalafil, PAH, (ADCIRCA) 20 MG tablet Take 20 mg by mouth at bedtime.   Marland Kitchen tobramycin, PF, (TOBI) 300 MG/5ML nebulizer solution Take 300 mg by nebulization 2 (two) times daily.   . Treprostinil (TYVASO) 0.6 MG/ML SOLN Inhale 18 mcg into the lungs 4 (four) times daily. 1.79m/2.9mL   (12breaths)  . vitamin B-12 (CYANOCOBALAMIN) 1000 MCG tablet Take 1,000 mcg by mouth 2 (two) times a week. Mon and Fri.   No facility-administered encounter medications on file as of 08/07/2018.        KJenetta Downer NP

## 2018-08-14 ENCOUNTER — Other Ambulatory Visit: Payer: Self-pay

## 2018-08-14 ENCOUNTER — Other Ambulatory Visit: Payer: Medicare Other | Admitting: Adult Health Nurse Practitioner

## 2018-08-14 DIAGNOSIS — Z515 Encounter for palliative care: Secondary | ICD-10-CM

## 2018-08-14 NOTE — Progress Notes (Signed)
[facility] Consult Note Telephone: [phone]  Fax: [phone]  PATIENT NAME: Deborah [name] DOB: 09/12/1950 MRN: [id number]  PRIMARY CARE PROVIDER:   Crist Infante, MD  REFERRING PROVIDER:  Crist Infante, MD [address] [facility] [place],  [place] [address]  RESPONSIBLE PARTYAlwyn Pea husband, Samaa Ueda [phone]    RECOMMENDATIONS and PLAN:  1. End stage restrictive lung disease/CREST.  Patient has had worsening SOB and currently is sitting in kitchen on 10.5 L O2 via nasal canula.  Per husband she has been sitting here for about an hour but has been talking.  She has been weaker and has not been able to walk much over the past 3 days.  She has been using her wheelchair more.  She had opportunity for Trilogy study at [facility] last week but she refused it at that time.  She has opportunity to go today or tomorrow and they are awaiting call to see which day.  Patient is anxious and apprehensive about using the Trilogy and having a mask on her face.  We went over questions about the Trilogy and looked up pictures on line.  This visit was mainly to answer questions as I was able to help encourage patient and make her more aware of what to expect.  She did seem more at ease about the study but still a bit apprehensive.  Though she has agreed to go ahead with it as this is her last intervention to help ease the symptoms of her lung disease. She was concerned that they would force her to continue with the study if she wasn't tolerating it and assured that that would not happen.  They would stop if she wanted them to. Set up appointment next week to see how the study went and how she is managing with what ever decision she has made.  I spent 60 minutes providing this consultation,  from 11:30 to 12:30. More than 50% of the time in this consultation was spent coordinating communication.   HISTORY OF PRESENT ILLNESS:  Deborah [name] is a 68 y.o.  year old female with multiple medical problems including end stage restrictive lung disease, CREST, CHF, CAD. Palliative Care was asked to help address goals of care.   CODE STATUS: Full Code but only wants 2-3 rounds of compressions and if unsuccessful STOP  PPS: 50% HOSPICE ELIGIBILITY/DIAGNOSIS: TBD  PHYSICAL EXAM:   General: NAD, frail appearing, thin Cardiovascular: regular rate and rhythm Pulmonary: diminished air flow throughout and wheezing heard in upper lobes Extremities: no edema, no joint deformities Skin: no rashes Neurological: Weakness but otherwise nonfocal; confusion at times  PAST MEDICAL HISTORY:  Past Medical History:  Diagnosis Date  . Anemia   . Bronchiectasis ([place])   . C. difficile colitis   . CAD (coronary artery disease)   . Cataract   . CHF (congestive heart failure) (HCC)    right sided heart failure from pulmonary hypertension  . COPD (chronic obstructive pulmonary disease) ([place])   . CREST (calcinosis, Raynaud's phenomenon, esophageal dysfunction, sclerodactyly, telangiectasia) ([place])   . Gallstones   . Hearing loss    right  . Hyperactive airway disease   . Hyperlipidemia   . Hypertension   . Lung cancer ([place]) carcinoid tumor  2004  . Osteopenia   . Parainfluenza   . Pneumonia   . Raynaud's disease   . Respiratory syncytial virus (RSV) infection   . Restrictive lung disease   . Shingles   .  Tubulovillous adenoma of rectum 2007    SOCIAL HX:  Social History   Tobacco Use  . Smoking status: Never Smoker  . Smokeless tobacco: Never Used  Substance Use Topics  . Alcohol use: No    Alcohol/week: 0.0 standard drinks    ALLERGIES:  Allergies  Allergen Reactions  . Megace Es [Megestrol Acetate]     Severe leg cramps   . Lodine [Etodolac] Rash    REACTION: rash     PERTINENT MEDICATIONS:  Outpatient Encounter Medications as of 08/14/2018  Medication Sig  . albuterol (PROVENTIL HFA;VENTOLIN HFA) 108 (90 BASE) MCG/ACT inhaler Inhale 2  puffs into the lungs every 6 (six) hours as needed. For shortness of breath. (Patient taking differently: Inhale 2 puffs into the lungs every 6 (six) hours as needed for shortness of breath. )  . atorvastatin (LIPITOR) 10 MG tablet Take 10 mg by mouth daily.  . cholecalciferol (VITAMIN D) 1000 units tablet Take 2,000 Units by mouth every Monday, Wednesday, and Friday.   . denosumab (PROLIA) 60 MG/ML SOLN injection Inject 60 mg into the skin every 6 (six) months. Administer in upper arm, thigh, or abdomen  . DULoxetine (CYMBALTA) 60 MG capsule Take 60 mg by mouth every evening.   . furosemide (LASIX) 40 MG tablet Take 40 mg by mouth See admin instructions. Take 4 times weekly only on Sun Tues Thurs and Sat.  . gabapentin (NEURONTIN) 400 MG capsule Take 400 mg by mouth 4 (four) times daily.  Marland Kitchen lidocaine (LIDODERM) 5 % Place 1 patch onto the skin daily. Remove & Discard patch within 12 hours or as directed by MD  . mometasone-formoterol (DULERA) 100-5 MCG/ACT AERO Inhale 2 puffs into the lungs 2 (two) times daily.  . Multiple Vitamin (MULTIVITAMIN WITH MINERALS) TABS Take 1 tablet by mouth daily.  Marland Kitchen oxycodone (OXY-IR) 5 MG capsule Take 5 mg by mouth 4 (four) times daily.   . OXYGEN Inhale into the lungs. Continuous, 4 L/min when exercising and 6 L/min when sitting  . pantoprazole (PROTONIX) 40 MG tablet Take 40 mg by mouth daily.   Vladimir Faster Glycol-Propyl Glycol 0.4-0.3 % SOLN Apply 1 drop to eye 2 (two) times a day.   . potassium chloride SA (K-DUR,KLOR-CON) 20 MEQ tablet Take 20 mEq by mouth See admin instructions. Take with lasix; Take 4 times weekly only on Sun Tues Thurs and Sat.  . predniSONE (DELTASONE) 10 MG tablet Take 10 mg by mouth daily with breakfast.  . Probiotic Product (ACIDOPHILUS SUPER PROBIOTIC PO) Take 1 capsule by mouth daily.   . sodium chloride HYPERTONIC 3 % nebulizer solution Take 3 mLs by nebulization daily.  . tadalafil, PAH, (ADCIRCA) 20 MG tablet Take 20 mg by mouth at  bedtime.   Marland Kitchen tobramycin, PF, (TOBI) 300 MG/5ML nebulizer solution Take 300 mg by nebulization 2 (two) times daily.   . Treprostinil (TYVASO) 0.6 MG/ML SOLN Inhale 18 mcg into the lungs 4 (four) times daily. 1.52m/2.9mL   (12breaths)  . vitamin B-12 (CYANOCOBALAMIN) 1000 MCG tablet Take 1,000 mcg by mouth 2 (two) times a week. Mon and Fri.   No facility-administered encounter medications on file as of 08/14/2018.       Duan Scharnhorst KJenetta Downer NP

## 2018-08-18 ENCOUNTER — Telehealth: Payer: Self-pay

## 2018-08-18 NOTE — Telephone Encounter (Signed)
Received message to call Freda Munro, Case Manager @ [facility] regarding patient deciding about utilizing hospice and trilogy. Spoke with Freda Munro. Patient has opted to stay with Palliative at this time.

## 2018-08-22 ENCOUNTER — Other Ambulatory Visit: Payer: Self-pay

## 2018-08-22 ENCOUNTER — Other Ambulatory Visit: Payer: Medicare Other | Admitting: Adult Health Nurse Practitioner

## 2018-08-22 DIAGNOSIS — Z515 Encounter for palliative care: Secondary | ICD-10-CM

## 2018-08-22 NOTE — Progress Notes (Signed)
[facility] Consult Note Telephone: [phone]  Fax: [phone]  PATIENT NAME: Deborah [name] DOB: [birth year] MRN: [id number]  PRIMARY CARE PROVIDER:   Crist Infante, MD  REFERRING PROVIDER:  Crist Infante, MD [address] [place],  [place] [address]  RESPONSIBLE PARTYAlwyn Pea husband, Zeta Bucy [phone]    RECOMMENDATIONS and PLAN:  1.  End stage restrictive lung disease/CREST. Patient had study for Trilogy last week at [facility].  Patient had a rough time with it but was able to work with it and now is using it at home.  She does have difficulty wearing it and [employer] rep was at house yesterday and was able to get it to fit better.  She is problems adjusting to it but is willing to try.  She was given a referral for [facility] PT/OT and she is going to give that a try as well. Patient and husband had questions about the machine and its effectiveness.  Attempted to answer all their questions.  Patient also had questions about hospice and attempted to answer all questions. She has been told by Dr. Corrin Parker, pulmonologist, that the Trilogy was her last attempt to feel better and if that didn't work her next steps were hospice.  She and her husband are okay with this and are now at a point that they do not want to go back to the hospital and just to treat at home.  She is able to walk short distances with a walker but tires very easily.  She tires easily just talking.  She is able to take herself to the bathroom.  Husband offers stand by assist with bathing.  She is able to feed herself but at times has problems with tremors that makes feeding herself difficult.  Her weight has been stable at 121 pounds.  Will visit again in 2 weeks to see if she wants to continue Trilogy or enroll in hospice.  I spent 60 minutes providing this consultation,  from 11:15 to 12:15. More than 50% of the time in this consultation was spent coordinating communication.    HISTORY OF PRESENT ILLNESS:  Deborah [name] is a 68 y.o. year old female with multiple medical problems including end stage restrictive lung disease, CREST, CHF, CAD. Palliative Care was asked to help address goals of care.   CODE STATUS: Full Code but only wants 2-3 rounds of compressions and if unsuccessful STOP  PPS: 50% HOSPICE ELIGIBILITY/DIAGNOSIS: TBD  PHYSICAL EXAM:   General: NAD, frail appearing, thin Extremities: no edema, no joint deformities Skin: no rashes Neurological: Weakness but otherwise nonfocal; confusion at times   PAST MEDICAL HISTORY:  Past Medical History:  Diagnosis Date  . Anemia   . Bronchiectasis ([place])   . C. difficile colitis   . CAD (coronary artery disease)   . Cataract   . CHF (congestive heart failure) (HCC)    right sided heart failure from pulmonary hypertension  . COPD (chronic obstructive pulmonary disease) ([place])   . CREST (calcinosis, Raynaud's phenomenon, esophageal dysfunction, sclerodactyly, telangiectasia) ([place])   . Gallstones   . Hearing loss    right  . Hyperactive airway disease   . Hyperlipidemia   . Hypertension   . Lung cancer ([place]) carcinoid tumor  2004  . Osteopenia   . Parainfluenza   . Pneumonia   . Raynaud's disease   . Respiratory syncytial virus (RSV) infection   . Restrictive lung disease   . Shingles   .  Tubulovillous adenoma of rectum 2007    SOCIAL HX:  Social History   Tobacco Use  . Smoking status: Never Smoker  . Smokeless tobacco: Never Used  Substance Use Topics  . Alcohol use: No    Alcohol/week: 0.0 standard drinks    ALLERGIES:  Allergies  Allergen Reactions  . Megace Es [Megestrol Acetate]     Severe leg cramps   . Lodine [Etodolac] Rash    REACTION: rash     PERTINENT MEDICATIONS:  Outpatient Encounter Medications as of 08/22/2018  Medication Sig  . albuterol (PROVENTIL HFA;VENTOLIN HFA) 108 (90 BASE) MCG/ACT inhaler Inhale 2 puffs into the lungs every 6 (six) hours as needed.  For shortness of breath. (Patient taking differently: Inhale 2 puffs into the lungs every 6 (six) hours as needed for shortness of breath. )  . atorvastatin (LIPITOR) 10 MG tablet Take 10 mg by mouth daily.  . cholecalciferol (VITAMIN D) 1000 units tablet Take 2,000 Units by mouth every Monday, Wednesday, and Friday.   . denosumab (PROLIA) 60 MG/ML SOLN injection Inject 60 mg into the skin every 6 (six) months. Administer in upper arm, thigh, or abdomen  . DULoxetine (CYMBALTA) 60 MG capsule Take 60 mg by mouth every evening.   . furosemide (LASIX) 40 MG tablet Take 40 mg by mouth See admin instructions. Take 4 times weekly only on Sun Tues Thurs and Sat.  . gabapentin (NEURONTIN) 400 MG capsule Take 400 mg by mouth 4 (four) times daily.  Marland Kitchen lidocaine (LIDODERM) 5 % Place 1 patch onto the skin daily. Remove & Discard patch within 12 hours or as directed by MD  . mometasone-formoterol (DULERA) 100-5 MCG/ACT AERO Inhale 2 puffs into the lungs 2 (two) times daily.  . Multiple Vitamin (MULTIVITAMIN WITH MINERALS) TABS Take 1 tablet by mouth daily.  Marland Kitchen oxycodone (OXY-IR) 5 MG capsule Take 5 mg by mouth 4 (four) times daily.   . OXYGEN Inhale into the lungs. Continuous, 4 L/min when exercising and 6 L/min when sitting  . pantoprazole (PROTONIX) 40 MG tablet Take 40 mg by mouth daily.   Vladimir Faster Glycol-Propyl Glycol 0.4-0.3 % SOLN Apply 1 drop to eye 2 (two) times a day.   . potassium chloride SA (K-DUR,KLOR-CON) 20 MEQ tablet Take 20 mEq by mouth See admin instructions. Take with lasix; Take 4 times weekly only on Sun Tues Thurs and Sat.  . predniSONE (DELTASONE) 10 MG tablet Take 10 mg by mouth daily with breakfast.  . Probiotic Product (ACIDOPHILUS SUPER PROBIOTIC PO) Take 1 capsule by mouth daily.   . sodium chloride HYPERTONIC 3 % nebulizer solution Take 3 mLs by nebulization daily.  . tadalafil, PAH, (ADCIRCA) 20 MG tablet Take 20 mg by mouth at bedtime.   Marland Kitchen tobramycin, PF, (TOBI) 300 MG/5ML  nebulizer solution Take 300 mg by nebulization 2 (two) times daily.   . Treprostinil (TYVASO) 0.6 MG/ML SOLN Inhale 18 mcg into the lungs 4 (four) times daily. 1.26m/2.9mL   (12breaths)  . vitamin B-12 (CYANOCOBALAMIN) 1000 MCG tablet Take 1,000 mcg by mouth 2 (two) times a week. Mon and Fri.   No facility-administered encounter medications on file as of 08/22/2018.      Amy KJenetta Downer NP

## 2018-08-23 ENCOUNTER — Telehealth (HOSPITAL_COMMUNITY): Payer: Self-pay | Admitting: *Deleted

## 2018-08-23 NOTE — Telephone Encounter (Signed)
Called to update patient on status of [facility].  Patient is not able to return to the program due to in the last phase of life.

## 2018-08-28 ENCOUNTER — Other Ambulatory Visit: Payer: Medicare Other | Admitting: Adult Health Nurse Practitioner

## 2018-08-28 ENCOUNTER — Other Ambulatory Visit: Payer: Self-pay

## 2018-08-28 DIAGNOSIS — Z515 Encounter for palliative care: Secondary | ICD-10-CM

## 2018-09-23 NOTE — Progress Notes (Signed)
[facility] Consult Note Telephone: [phone]  Fax: [phone]  PATIENT NAME: Deborah [name] DOB: 27-May-1950 MRN: [id number]  PRIMARY CARE PROVIDER:   Crist Infante, MD  REFERRING PROVIDER:  Crist Infante, MD [place],  [place] [address]  RESPONSIBLE PARTY:   Alwyn Pea husband, Lyrika Souders [phone]  Due to the COVID-19 crisis, this telephone evaluation and treatment contact was done via telephone and it was initiated and consent by this patient and or family.    RECOMMENDATIONS and PLAN:  Patient's husband called this morning and think it is time for hospice.  States that patient has not been able to walk this weekend.  Has been eating only bites and not drinking much.  States her urine is dark.  Says that she has been using a non rebreather mask at 12-14L at rest to keep her O2 sats around 90%.  She was started on Trilogy last week and she has not been able to tolerate this.  He thinks this may be her last days and wants to make her comfortable. Contacted Dr. Konrad Dolores and hospice referral to get hospice started.  Dr. Joylene Draft agrees to continue being attending.     I spent 20 minutes providing this consultation,  from 8:00 to 8:20. More than 50% of the time in this consultation was spent coordinating communication.   HISTORY OF PRESENT ILLNESS:  Deborah [name] is a 68 y.o. year old female with multiple medical problems including end stage restrictive lung disease, CREST, CHF, CAD. Palliative Care was asked to help address goals of care.   CODE STATUS: Full Code but only wants 2-3 rounds of compressions and if unsuccessful STOP  PPS: 40% HOSPICE ELIGIBILITY/DIAGNOSIS: yes/end stage restrictive lung disease and CREST  PAST MEDICAL HISTORY:  Past Medical History:  Diagnosis Date  . Anemia   . Bronchiectasis ([place])   . C. difficile colitis   . CAD (coronary artery disease)   . Cataract   . CHF (congestive  heart failure) (HCC)    right sided heart failure from pulmonary hypertension  . COPD (chronic obstructive pulmonary disease) ([place])   . CREST (calcinosis, Raynaud's phenomenon, esophageal dysfunction, sclerodactyly, telangiectasia) ([place])   . Gallstones   . Hearing loss    right  . Hyperactive airway disease   . Hyperlipidemia   . Hypertension   . Lung cancer ([place]) carcinoid tumor  2004  . Osteopenia   . Parainfluenza   . Pneumonia   . Raynaud's disease   . Respiratory syncytial virus (RSV) infection   . Restrictive lung disease   . Shingles   . Tubulovillous adenoma of rectum 2007    SOCIAL HX:  Social History   Tobacco Use  . Smoking status: Never Smoker  . Smokeless tobacco: Never Used  Substance Use Topics  . Alcohol use: No    Alcohol/week: 0.0 standard drinks    ALLERGIES:  Allergies  Allergen Reactions  . Megace Es [Megestrol Acetate]     Severe leg cramps   . Lodine [Etodolac] Rash    REACTION: rash     PERTINENT MEDICATIONS:  Outpatient Encounter Medications as of 09-09-18  Medication Sig  . albuterol (PROVENTIL HFA;VENTOLIN HFA) 108 (90 BASE) MCG/ACT inhaler Inhale 2 puffs into the lungs every 6 (six) hours as needed. For shortness of breath. (Patient taking differently: Inhale 2 puffs into the lungs every 6 (six) hours as needed for shortness of breath. )  . atorvastatin (LIPITOR) 10 MG tablet  Take 10 mg by mouth daily.  . cholecalciferol (VITAMIN D) 1000 units tablet Take 2,000 Units by mouth every Monday, Wednesday, and Friday.   . denosumab (PROLIA) 60 MG/ML SOLN injection Inject 60 mg into the skin every 6 (six) months. Administer in upper arm, thigh, or abdomen  . DULoxetine (CYMBALTA) 60 MG capsule Take 60 mg by mouth every evening.   . furosemide (LASIX) 40 MG tablet Take 40 mg by mouth See admin instructions. Take 4 times weekly only on Sun Tues Thurs and Sat.  . gabapentin (NEURONTIN) 400 MG capsule Take 400 mg by mouth 4 (four) times daily.  Marland Kitchen  lidocaine (LIDODERM) 5 % Place 1 patch onto the skin daily. Remove & Discard patch within 12 hours or as directed by MD  . mometasone-formoterol (DULERA) 100-5 MCG/ACT AERO Inhale 2 puffs into the lungs 2 (two) times daily.  . Multiple Vitamin (MULTIVITAMIN WITH MINERALS) TABS Take 1 tablet by mouth daily.  Marland Kitchen oxycodone (OXY-IR) 5 MG capsule Take 5 mg by mouth 4 (four) times daily.   . OXYGEN Inhale into the lungs. Continuous, 4 L/min when exercising and 6 L/min when sitting  . pantoprazole (PROTONIX) 40 MG tablet Take 40 mg by mouth daily.   Vladimir Faster Glycol-Propyl Glycol 0.4-0.3 % SOLN Apply 1 drop to eye 2 (two) times a day.   . potassium chloride SA (K-DUR,KLOR-CON) 20 MEQ tablet Take 20 mEq by mouth See admin instructions. Take with lasix; Take 4 times weekly only on Sun Tues Thurs and Sat.  . predniSONE (DELTASONE) 10 MG tablet Take 10 mg by mouth daily with breakfast.  . Probiotic Product (ACIDOPHILUS SUPER PROBIOTIC PO) Take 1 capsule by mouth daily.   . sodium chloride HYPERTONIC 3 % nebulizer solution Take 3 mLs by nebulization daily.  . tadalafil, PAH, (ADCIRCA) 20 MG tablet Take 20 mg by mouth at bedtime.   Marland Kitchen tobramycin, PF, (TOBI) 300 MG/5ML nebulizer solution Take 300 mg by nebulization 2 (two) times daily.   . Treprostinil (TYVASO) 0.6 MG/ML SOLN Inhale 18 mcg into the lungs 4 (four) times daily. 1.8m/2.9mL   (12breaths)  . vitamin B-12 (CYANOCOBALAMIN) 1000 MCG tablet Take 1,000 mcg by mouth 2 (two) times a week. Mon and Fri.   No facility-administered encounter medications on file as of 707-12-2018        Timotheus Salm KJenetta Downer NP

## 2018-09-23 DEATH — deceased
